# Patient Record
Sex: Female | Born: 1960 | Race: White | Hispanic: No | Marital: Single | Smoking: Former smoker
Health system: Southern US, Community
[De-identification: ages and names within clinical notes are randomized; demographics above are authoritative.]

## PROBLEM LIST (undated history)

## (undated) DIAGNOSIS — M7500 Adhesive capsulitis of unspecified shoulder: Secondary | ICD-10-CM

## (undated) DIAGNOSIS — K219 Gastro-esophageal reflux disease without esophagitis: Secondary | ICD-10-CM

## (undated) DIAGNOSIS — I10 Essential (primary) hypertension: Secondary | ICD-10-CM

## (undated) DIAGNOSIS — G5600 Carpal tunnel syndrome, unspecified upper limb: Secondary | ICD-10-CM

## (undated) DIAGNOSIS — N200 Calculus of kidney: Secondary | ICD-10-CM

## (undated) DIAGNOSIS — F32A Depression, unspecified: Secondary | ICD-10-CM

## (undated) DIAGNOSIS — F329 Major depressive disorder, single episode, unspecified: Secondary | ICD-10-CM

## (undated) DIAGNOSIS — E119 Type 2 diabetes mellitus without complications: Secondary | ICD-10-CM

## (undated) DIAGNOSIS — F603 Borderline personality disorder: Secondary | ICD-10-CM

## (undated) DIAGNOSIS — T7840XA Allergy, unspecified, initial encounter: Secondary | ICD-10-CM

## (undated) DIAGNOSIS — J449 Chronic obstructive pulmonary disease, unspecified: Secondary | ICD-10-CM

## (undated) HISTORY — PX: TONSILLECTOMY: SUR1361

## (undated) HISTORY — DX: Carpal tunnel syndrome, unspecified upper limb: G56.00

## (undated) HISTORY — PX: CHOLECYSTECTOMY: SHX55

## (undated) HISTORY — DX: Gastro-esophageal reflux disease without esophagitis: K21.9

## (undated) HISTORY — DX: Depression, unspecified: F32.A

## (undated) HISTORY — DX: Borderline personality disorder: F60.3

## (undated) HISTORY — DX: Calculus of kidney: N20.0

## (undated) HISTORY — DX: Major depressive disorder, single episode, unspecified: F32.9

## (undated) HISTORY — DX: Chronic obstructive pulmonary disease, unspecified: J44.9

## (undated) HISTORY — DX: Adhesive capsulitis of unspecified shoulder: M75.00

## (undated) HISTORY — DX: Allergy, unspecified, initial encounter: T78.40XA

## (undated) HISTORY — DX: Essential (primary) hypertension: I10

## (undated) HISTORY — DX: Type 2 diabetes mellitus without complications: E11.9

---

## 2002-03-07 ENCOUNTER — Encounter (INDEPENDENT_AMBULATORY_CARE_PROVIDER_SITE_OTHER): Payer: Self-pay | Admitting: *Deleted

## 2003-02-05 ENCOUNTER — Encounter: Admission: RE | Admit: 2003-02-05 | Discharge: 2003-02-05 | Payer: Self-pay | Admitting: Family Medicine

## 2003-04-16 ENCOUNTER — Encounter: Admission: RE | Admit: 2003-04-16 | Discharge: 2003-04-16 | Payer: Self-pay | Admitting: Family Medicine

## 2003-05-14 ENCOUNTER — Encounter: Admission: RE | Admit: 2003-05-14 | Discharge: 2003-05-14 | Payer: Self-pay | Admitting: Family Medicine

## 2003-05-17 ENCOUNTER — Encounter: Admission: RE | Admit: 2003-05-17 | Discharge: 2003-05-17 | Payer: Self-pay | Admitting: Family Medicine

## 2003-05-28 ENCOUNTER — Emergency Department (HOSPITAL_COMMUNITY): Admission: EM | Admit: 2003-05-28 | Discharge: 2003-05-28 | Payer: Self-pay | Admitting: Emergency Medicine

## 2003-06-11 ENCOUNTER — Encounter: Admission: RE | Admit: 2003-06-11 | Discharge: 2003-06-11 | Payer: Self-pay | Admitting: Family Medicine

## 2003-08-20 ENCOUNTER — Encounter: Admission: RE | Admit: 2003-08-20 | Discharge: 2003-08-20 | Payer: Self-pay | Admitting: Family Medicine

## 2003-08-28 ENCOUNTER — Encounter: Admission: RE | Admit: 2003-08-28 | Discharge: 2003-08-28 | Payer: Self-pay | Admitting: Family Medicine

## 2003-09-10 ENCOUNTER — Encounter: Admission: RE | Admit: 2003-09-10 | Discharge: 2003-09-10 | Payer: Self-pay | Admitting: Family Medicine

## 2003-09-24 ENCOUNTER — Encounter: Admission: RE | Admit: 2003-09-24 | Discharge: 2003-09-24 | Payer: Self-pay | Admitting: Family Medicine

## 2003-10-08 ENCOUNTER — Encounter: Admission: RE | Admit: 2003-10-08 | Discharge: 2003-10-08 | Payer: Self-pay | Admitting: Family Medicine

## 2003-10-18 ENCOUNTER — Encounter: Admission: RE | Admit: 2003-10-18 | Discharge: 2003-10-18 | Payer: Self-pay | Admitting: Family Medicine

## 2003-11-05 ENCOUNTER — Encounter: Admission: RE | Admit: 2003-11-05 | Discharge: 2003-11-05 | Payer: Self-pay | Admitting: Family Medicine

## 2003-12-03 ENCOUNTER — Encounter: Admission: RE | Admit: 2003-12-03 | Discharge: 2003-12-03 | Payer: Self-pay | Admitting: Family Medicine

## 2004-01-14 ENCOUNTER — Encounter: Admission: RE | Admit: 2004-01-14 | Discharge: 2004-01-14 | Payer: Self-pay | Admitting: Family Medicine

## 2004-04-07 ENCOUNTER — Ambulatory Visit: Payer: Self-pay | Admitting: Family Medicine

## 2004-05-27 ENCOUNTER — Ambulatory Visit: Payer: Self-pay | Admitting: Sports Medicine

## 2004-06-03 ENCOUNTER — Ambulatory Visit: Payer: Self-pay | Admitting: Sports Medicine

## 2004-06-30 ENCOUNTER — Ambulatory Visit: Payer: Self-pay | Admitting: Family Medicine

## 2004-09-22 ENCOUNTER — Ambulatory Visit: Payer: Self-pay | Admitting: Family Medicine

## 2004-10-20 ENCOUNTER — Ambulatory Visit: Payer: Self-pay | Admitting: Family Medicine

## 2004-11-03 ENCOUNTER — Ambulatory Visit: Payer: Self-pay | Admitting: Family Medicine

## 2004-11-04 ENCOUNTER — Encounter: Admission: RE | Admit: 2004-11-04 | Discharge: 2004-11-04 | Payer: Self-pay | Admitting: Sports Medicine

## 2005-03-02 ENCOUNTER — Ambulatory Visit: Payer: Self-pay | Admitting: Sports Medicine

## 2005-03-23 ENCOUNTER — Ambulatory Visit: Payer: Self-pay | Admitting: Family Medicine

## 2005-04-13 ENCOUNTER — Ambulatory Visit: Payer: Self-pay | Admitting: Family Medicine

## 2005-07-05 ENCOUNTER — Ambulatory Visit: Payer: Self-pay | Admitting: Family Medicine

## 2005-07-09 ENCOUNTER — Ambulatory Visit: Payer: Self-pay | Admitting: Family Medicine

## 2005-07-27 ENCOUNTER — Ambulatory Visit: Payer: Self-pay | Admitting: Sports Medicine

## 2005-08-10 ENCOUNTER — Ambulatory Visit: Payer: Self-pay | Admitting: Family Medicine

## 2005-08-10 ENCOUNTER — Encounter: Payer: Self-pay | Admitting: Family Medicine

## 2005-08-27 ENCOUNTER — Ambulatory Visit: Payer: Self-pay | Admitting: Family Medicine

## 2005-08-31 ENCOUNTER — Ambulatory Visit: Payer: Self-pay | Admitting: Family Medicine

## 2005-09-14 ENCOUNTER — Ambulatory Visit: Payer: Self-pay | Admitting: Sports Medicine

## 2005-10-06 ENCOUNTER — Ambulatory Visit: Payer: Self-pay | Admitting: Family Medicine

## 2006-01-18 ENCOUNTER — Ambulatory Visit: Payer: Self-pay | Admitting: Family Medicine

## 2006-02-28 ENCOUNTER — Ambulatory Visit: Payer: Self-pay | Admitting: Family Medicine

## 2006-03-22 ENCOUNTER — Ambulatory Visit: Payer: Self-pay | Admitting: Family Medicine

## 2006-03-28 ENCOUNTER — Ambulatory Visit: Payer: Self-pay | Admitting: Family Medicine

## 2006-03-30 ENCOUNTER — Ambulatory Visit: Payer: Self-pay | Admitting: Sports Medicine

## 2006-04-12 ENCOUNTER — Ambulatory Visit: Payer: Self-pay | Admitting: Family Medicine

## 2006-08-04 DIAGNOSIS — K589 Irritable bowel syndrome without diarrhea: Secondary | ICD-10-CM

## 2006-08-04 DIAGNOSIS — F339 Major depressive disorder, recurrent, unspecified: Secondary | ICD-10-CM

## 2006-08-04 DIAGNOSIS — F603 Borderline personality disorder: Secondary | ICD-10-CM

## 2006-08-04 DIAGNOSIS — I10 Essential (primary) hypertension: Secondary | ICD-10-CM

## 2006-08-05 ENCOUNTER — Encounter (INDEPENDENT_AMBULATORY_CARE_PROVIDER_SITE_OTHER): Payer: Self-pay | Admitting: *Deleted

## 2006-09-20 ENCOUNTER — Ambulatory Visit: Payer: Self-pay | Admitting: Family Medicine

## 2006-09-20 ENCOUNTER — Encounter: Payer: Self-pay | Admitting: Family Medicine

## 2006-09-20 DIAGNOSIS — K219 Gastro-esophageal reflux disease without esophagitis: Secondary | ICD-10-CM

## 2006-09-29 ENCOUNTER — Encounter: Payer: Self-pay | Admitting: Family Medicine

## 2006-10-05 ENCOUNTER — Encounter: Payer: Self-pay | Admitting: Family Medicine

## 2006-10-12 ENCOUNTER — Telehealth (INDEPENDENT_AMBULATORY_CARE_PROVIDER_SITE_OTHER): Payer: Self-pay | Admitting: *Deleted

## 2006-11-02 ENCOUNTER — Telehealth: Payer: Self-pay | Admitting: *Deleted

## 2006-11-28 ENCOUNTER — Telehealth: Payer: Self-pay | Admitting: *Deleted

## 2006-12-06 ENCOUNTER — Ambulatory Visit: Payer: Self-pay | Admitting: Family Medicine

## 2006-12-12 ENCOUNTER — Telehealth: Payer: Self-pay | Admitting: *Deleted

## 2006-12-13 ENCOUNTER — Ambulatory Visit: Payer: Self-pay | Admitting: Family Medicine

## 2006-12-13 ENCOUNTER — Encounter: Payer: Self-pay | Admitting: *Deleted

## 2006-12-13 LAB — CONVERTED CEMR LAB
Blood in Urine, dipstick: NEGATIVE
Glucose, Urine, Semiquant: NEGATIVE
Ketones, urine, test strip: NEGATIVE
Nitrite: NEGATIVE
Protein, U semiquant: NEGATIVE
pH: 6

## 2006-12-15 LAB — CONVERTED CEMR LAB
Chlamydia, DNA Probe: NEGATIVE
GC Probe Amp, Genital: NEGATIVE

## 2006-12-20 ENCOUNTER — Telehealth: Payer: Self-pay | Admitting: *Deleted

## 2006-12-21 ENCOUNTER — Ambulatory Visit: Payer: Self-pay | Admitting: Family Medicine

## 2006-12-21 LAB — CONVERTED CEMR LAB
Blood in Urine, dipstick: NEGATIVE
Specific Gravity, Urine: 1.015
Urobilinogen, UA: 1
Whiff Test: NEGATIVE

## 2006-12-23 ENCOUNTER — Telehealth: Payer: Self-pay | Admitting: *Deleted

## 2006-12-26 ENCOUNTER — Telehealth: Payer: Self-pay | Admitting: *Deleted

## 2006-12-28 ENCOUNTER — Ambulatory Visit: Payer: Self-pay | Admitting: Family Medicine

## 2006-12-28 ENCOUNTER — Encounter (INDEPENDENT_AMBULATORY_CARE_PROVIDER_SITE_OTHER): Payer: Self-pay | Admitting: Family Medicine

## 2006-12-28 LAB — CONVERTED CEMR LAB
Ketones, urine, test strip: NEGATIVE
Nitrite: NEGATIVE
Specific Gravity, Urine: 1.025

## 2007-01-20 ENCOUNTER — Encounter (INDEPENDENT_AMBULATORY_CARE_PROVIDER_SITE_OTHER): Payer: Self-pay | Admitting: Family Medicine

## 2007-01-20 ENCOUNTER — Telehealth (INDEPENDENT_AMBULATORY_CARE_PROVIDER_SITE_OTHER): Payer: Self-pay | Admitting: *Deleted

## 2007-01-20 ENCOUNTER — Ambulatory Visit: Payer: Self-pay | Admitting: Family Medicine

## 2007-01-20 LAB — CONVERTED CEMR LAB
Bilirubin Urine: NEGATIVE
Ketones, urine, test strip: NEGATIVE
Specific Gravity, Urine: >=1.03
Whiff Test: NEGATIVE
pH: 6

## 2007-01-21 ENCOUNTER — Encounter (INDEPENDENT_AMBULATORY_CARE_PROVIDER_SITE_OTHER): Payer: Self-pay | Admitting: Family Medicine

## 2007-01-30 ENCOUNTER — Ambulatory Visit: Payer: Self-pay | Admitting: Family Medicine

## 2007-01-30 ENCOUNTER — Telehealth (INDEPENDENT_AMBULATORY_CARE_PROVIDER_SITE_OTHER): Payer: Self-pay | Admitting: *Deleted

## 2007-02-21 ENCOUNTER — Ambulatory Visit: Payer: Self-pay | Admitting: Family Medicine

## 2007-02-21 ENCOUNTER — Encounter: Admission: RE | Admit: 2007-02-21 | Discharge: 2007-02-21 | Payer: Self-pay | Admitting: Family Medicine

## 2007-02-24 ENCOUNTER — Encounter: Payer: Self-pay | Admitting: Family Medicine

## 2007-02-27 ENCOUNTER — Telehealth: Payer: Self-pay | Admitting: Family Medicine

## 2007-03-27 ENCOUNTER — Ambulatory Visit: Payer: Self-pay | Admitting: Sports Medicine

## 2007-03-27 ENCOUNTER — Telehealth: Payer: Self-pay | Admitting: *Deleted

## 2007-04-07 ENCOUNTER — Telehealth: Payer: Self-pay | Admitting: Family Medicine

## 2007-06-09 ENCOUNTER — Encounter (INDEPENDENT_AMBULATORY_CARE_PROVIDER_SITE_OTHER): Payer: Self-pay | Admitting: Family Medicine

## 2007-06-09 ENCOUNTER — Ambulatory Visit: Payer: Self-pay | Admitting: Family Medicine

## 2007-06-09 DIAGNOSIS — J4489 Other specified chronic obstructive pulmonary disease: Secondary | ICD-10-CM | POA: Insufficient documentation

## 2007-06-09 DIAGNOSIS — J449 Chronic obstructive pulmonary disease, unspecified: Secondary | ICD-10-CM

## 2007-06-09 LAB — CONVERTED CEMR LAB: Whiff Test: NEGATIVE

## 2007-06-13 ENCOUNTER — Telehealth: Payer: Self-pay | Admitting: *Deleted

## 2007-06-14 ENCOUNTER — Ambulatory Visit: Payer: Self-pay | Admitting: Family Medicine

## 2007-06-14 ENCOUNTER — Encounter (INDEPENDENT_AMBULATORY_CARE_PROVIDER_SITE_OTHER): Payer: Self-pay | Admitting: Family Medicine

## 2007-06-14 LAB — CONVERTED CEMR LAB
ALT: 20 units/L (ref 0–35)
BUN: 17 mg/dL (ref 6–23)
CO2: 24 meq/L (ref 19–32)
Creatinine, Ser: 0.65 mg/dL (ref 0.40–1.20)
Glucose, Urine, Semiquant: NEGATIVE
HCT: 45.6 % (ref 36.0–46.0)
MCV: 91 fL (ref 78.0–100.0)
Nitrite: NEGATIVE
Platelets: 282 10*3/uL (ref 150–400)
Total Bilirubin: 0.5 mg/dL (ref 0.3–1.2)
Urobilinogen, UA: 0.2
WBC: 8.6 10*3/uL (ref 4.0–10.5)

## 2007-06-21 ENCOUNTER — Telehealth: Payer: Self-pay | Admitting: *Deleted

## 2007-06-22 ENCOUNTER — Ambulatory Visit: Payer: Self-pay | Admitting: Family Medicine

## 2007-07-13 ENCOUNTER — Telehealth: Payer: Self-pay | Admitting: *Deleted

## 2007-07-14 ENCOUNTER — Ambulatory Visit: Payer: Self-pay | Admitting: Family Medicine

## 2007-07-25 ENCOUNTER — Encounter: Payer: Self-pay | Admitting: Family Medicine

## 2007-07-25 ENCOUNTER — Ambulatory Visit: Payer: Self-pay | Admitting: Family Medicine

## 2007-07-25 DIAGNOSIS — E669 Obesity, unspecified: Secondary | ICD-10-CM

## 2007-08-15 ENCOUNTER — Encounter (INDEPENDENT_AMBULATORY_CARE_PROVIDER_SITE_OTHER): Payer: Self-pay | Admitting: Family Medicine

## 2007-08-15 ENCOUNTER — Ambulatory Visit: Payer: Self-pay | Admitting: Family Medicine

## 2007-08-22 ENCOUNTER — Ambulatory Visit: Payer: Self-pay | Admitting: Family Medicine

## 2007-08-29 ENCOUNTER — Ambulatory Visit: Payer: Self-pay | Admitting: Family Medicine

## 2007-08-29 LAB — CONVERTED CEMR LAB: Whiff Test: NEGATIVE

## 2007-09-19 ENCOUNTER — Ambulatory Visit: Payer: Self-pay | Admitting: Family Medicine

## 2007-10-11 ENCOUNTER — Ambulatory Visit: Payer: Self-pay | Admitting: Family Medicine

## 2007-10-17 ENCOUNTER — Encounter: Payer: Self-pay | Admitting: Family Medicine

## 2007-10-17 ENCOUNTER — Ambulatory Visit: Payer: Self-pay | Admitting: Family Medicine

## 2007-10-17 ENCOUNTER — Encounter: Payer: Self-pay | Admitting: *Deleted

## 2007-10-17 LAB — CONVERTED CEMR LAB: Pap Smear: NORMAL

## 2007-10-24 ENCOUNTER — Encounter: Payer: Self-pay | Admitting: Family Medicine

## 2007-10-31 ENCOUNTER — Ambulatory Visit: Payer: Self-pay | Admitting: Family Medicine

## 2007-11-02 ENCOUNTER — Telehealth: Payer: Self-pay | Admitting: *Deleted

## 2007-11-16 ENCOUNTER — Encounter: Admission: RE | Admit: 2007-11-16 | Discharge: 2007-12-28 | Payer: Self-pay | Admitting: Family Medicine

## 2007-11-27 ENCOUNTER — Ambulatory Visit: Payer: Self-pay | Admitting: Sports Medicine

## 2007-11-28 ENCOUNTER — Telehealth (INDEPENDENT_AMBULATORY_CARE_PROVIDER_SITE_OTHER): Payer: Self-pay | Admitting: *Deleted

## 2007-11-29 ENCOUNTER — Emergency Department (HOSPITAL_COMMUNITY): Admission: EM | Admit: 2007-11-29 | Discharge: 2007-11-29 | Payer: Self-pay | Admitting: Family Medicine

## 2007-11-30 ENCOUNTER — Telehealth: Payer: Self-pay | Admitting: *Deleted

## 2007-12-05 ENCOUNTER — Telehealth: Payer: Self-pay | Admitting: *Deleted

## 2007-12-05 ENCOUNTER — Ambulatory Visit: Payer: Self-pay | Admitting: Family Medicine

## 2007-12-26 ENCOUNTER — Ambulatory Visit: Payer: Self-pay | Admitting: Family Medicine

## 2008-01-09 ENCOUNTER — Encounter: Payer: Self-pay | Admitting: Family Medicine

## 2008-01-12 ENCOUNTER — Ambulatory Visit: Payer: Self-pay | Admitting: Family Medicine

## 2008-01-15 ENCOUNTER — Ambulatory Visit: Payer: Self-pay | Admitting: Sports Medicine

## 2008-01-15 DIAGNOSIS — M7500 Adhesive capsulitis of unspecified shoulder: Secondary | ICD-10-CM | POA: Insufficient documentation

## 2008-02-09 ENCOUNTER — Ambulatory Visit: Payer: Self-pay | Admitting: Family Medicine

## 2008-02-09 LAB — CONVERTED CEMR LAB: Beta hcg, urine, semiquantitative: NEGATIVE

## 2008-03-12 ENCOUNTER — Telehealth (INDEPENDENT_AMBULATORY_CARE_PROVIDER_SITE_OTHER): Payer: Self-pay | Admitting: *Deleted

## 2008-03-15 ENCOUNTER — Ambulatory Visit: Payer: Self-pay | Admitting: Family Medicine

## 2008-03-22 ENCOUNTER — Ambulatory Visit: Payer: Self-pay | Admitting: Family Medicine

## 2008-03-29 ENCOUNTER — Telehealth: Payer: Self-pay | Admitting: *Deleted

## 2008-05-01 ENCOUNTER — Ambulatory Visit: Payer: Self-pay | Admitting: Family Medicine

## 2008-05-01 LAB — CONVERTED CEMR LAB
ALT: 12 units/L (ref 0–35)
AST: 12 units/L (ref 0–37)
Albumin: 3.8 g/dL (ref 3.5–5.2)
Alkaline Phosphatase: 65 units/L (ref 39–117)
Calcium: 8.8 mg/dL (ref 8.4–10.5)
Chloride: 102 meq/L (ref 96–112)
Hemoglobin: 13.5 g/dL (ref 12.0–15.0)
LDL Cholesterol: 90 mg/dL (ref 0–99)
Platelets: 274 10*3/uL (ref 150–400)
Potassium: 4 meq/L (ref 3.5–5.3)
RDW: 14.4 % (ref 11.5–15.5)
Sodium: 140 meq/L (ref 135–145)
TSH: 3.37 microintl units/mL (ref 0.350–4.50)
Total Protein: 6.9 g/dL (ref 6.0–8.3)

## 2008-06-17 ENCOUNTER — Telehealth: Payer: Self-pay | Admitting: *Deleted

## 2008-06-19 ENCOUNTER — Ambulatory Visit: Payer: Self-pay | Admitting: Family Medicine

## 2008-07-10 ENCOUNTER — Telehealth: Payer: Self-pay | Admitting: Family Medicine

## 2008-07-30 ENCOUNTER — Ambulatory Visit: Payer: Self-pay | Admitting: Family Medicine

## 2008-07-30 DIAGNOSIS — M159 Polyosteoarthritis, unspecified: Secondary | ICD-10-CM | POA: Insufficient documentation

## 2008-10-01 ENCOUNTER — Encounter: Payer: Self-pay | Admitting: Family Medicine

## 2008-10-01 ENCOUNTER — Ambulatory Visit: Payer: Self-pay | Admitting: Family Medicine

## 2008-10-01 LAB — CONVERTED CEMR LAB: Beta hcg, urine, semiquantitative: NEGATIVE

## 2008-10-08 ENCOUNTER — Encounter: Payer: Self-pay | Admitting: Family Medicine

## 2008-10-28 ENCOUNTER — Ambulatory Visit: Payer: Self-pay | Admitting: Family Medicine

## 2008-11-05 ENCOUNTER — Ambulatory Visit: Payer: Self-pay | Admitting: Family Medicine

## 2008-11-13 ENCOUNTER — Telehealth: Payer: Self-pay | Admitting: *Deleted

## 2008-11-15 ENCOUNTER — Ambulatory Visit: Payer: Self-pay | Admitting: Family Medicine

## 2008-11-18 ENCOUNTER — Ambulatory Visit: Payer: Self-pay | Admitting: Family Medicine

## 2008-11-18 DIAGNOSIS — J309 Allergic rhinitis, unspecified: Secondary | ICD-10-CM

## 2008-11-22 ENCOUNTER — Ambulatory Visit: Payer: Self-pay | Admitting: Family Medicine

## 2008-12-12 ENCOUNTER — Ambulatory Visit: Payer: Self-pay | Admitting: Family Medicine

## 2008-12-12 ENCOUNTER — Telehealth: Payer: Self-pay | Admitting: *Deleted

## 2009-01-21 ENCOUNTER — Encounter: Admission: RE | Admit: 2009-01-21 | Discharge: 2009-01-21 | Payer: Self-pay | Admitting: Family Medicine

## 2009-01-21 ENCOUNTER — Ambulatory Visit: Payer: Self-pay | Admitting: Family Medicine

## 2009-05-13 ENCOUNTER — Ambulatory Visit: Payer: Self-pay | Admitting: Family Medicine

## 2009-06-09 ENCOUNTER — Ambulatory Visit: Payer: Self-pay | Admitting: Family Medicine

## 2009-07-31 ENCOUNTER — Ambulatory Visit: Payer: Self-pay | Admitting: Family Medicine

## 2009-08-05 ENCOUNTER — Telehealth: Payer: Self-pay | Admitting: Family Medicine

## 2009-08-08 ENCOUNTER — Ambulatory Visit: Payer: Self-pay | Admitting: Family Medicine

## 2009-08-18 ENCOUNTER — Telehealth: Payer: Self-pay | Admitting: Family Medicine

## 2009-08-18 ENCOUNTER — Ambulatory Visit: Payer: Self-pay | Admitting: Family Medicine

## 2009-08-29 ENCOUNTER — Ambulatory Visit: Payer: Self-pay | Admitting: Family Medicine

## 2009-09-24 ENCOUNTER — Ambulatory Visit: Payer: Self-pay | Admitting: Family Medicine

## 2009-10-06 ENCOUNTER — Ambulatory Visit: Payer: Self-pay | Admitting: Family Medicine

## 2009-10-06 LAB — CONVERTED CEMR LAB
ALT: 22 units/L (ref 0–35)
Albumin: 4 g/dL (ref 3.5–5.2)
Alkaline Phosphatase: 57 units/L (ref 39–117)
Basophils Absolute: 0.1 10*3/uL (ref 0.0–0.1)
Basophils Relative: 1 % (ref 0–1)
CO2: 24 meq/L (ref 19–32)
FSH: 25.4 milliintl units/mL
Glucose, Bld: 108 mg/dL — ABNORMAL HIGH (ref 70–99)
Hemoglobin: 13.5 g/dL (ref 12.0–15.0)
LDL Cholesterol: 96 mg/dL (ref 0–99)
Lymphocytes Relative: 32 % (ref 12–46)
Monocytes Absolute: 0.5 10*3/uL (ref 0.1–1.0)
Monocytes Relative: 7 % (ref 3–12)
Neutro Abs: 4.5 10*3/uL (ref 1.7–7.7)
Neutrophils Relative %: 59 % (ref 43–77)
Pap Smear: NEGATIVE
Potassium: 3.8 meq/L (ref 3.5–5.3)
RBC: 4.4 M/uL (ref 3.87–5.11)
Sodium: 140 meq/L (ref 135–145)
TSH: 3.878 microintl units/mL (ref 0.350–4.500)
Total Bilirubin: 0.6 mg/dL (ref 0.3–1.2)
Total Protein: 6.7 g/dL (ref 6.0–8.3)
Triglycerides: 167 mg/dL — ABNORMAL HIGH (ref <150)
VLDL: 33 mg/dL (ref 0–40)

## 2009-10-07 ENCOUNTER — Encounter: Payer: Self-pay | Admitting: Family Medicine

## 2009-10-16 ENCOUNTER — Ambulatory Visit: Payer: Self-pay | Admitting: Family Medicine

## 2009-10-16 LAB — CONVERTED CEMR LAB: Beta hcg, urine, semiquantitative: NEGATIVE

## 2009-11-26 ENCOUNTER — Telehealth: Payer: Self-pay | Admitting: Family Medicine

## 2010-01-29 ENCOUNTER — Ambulatory Visit: Payer: Self-pay | Admitting: Family Medicine

## 2010-03-04 ENCOUNTER — Ambulatory Visit: Payer: Self-pay | Admitting: Family Medicine

## 2010-04-20 ENCOUNTER — Encounter: Payer: Self-pay | Admitting: Family Medicine

## 2010-05-25 ENCOUNTER — Ambulatory Visit: Payer: Self-pay

## 2010-06-10 ENCOUNTER — Ambulatory Visit
Admission: RE | Admit: 2010-06-10 | Discharge: 2010-06-10 | Payer: Self-pay | Source: Home / Self Care | Attending: Family Medicine | Admitting: Family Medicine

## 2010-06-10 DIAGNOSIS — G5600 Carpal tunnel syndrome, unspecified upper limb: Secondary | ICD-10-CM | POA: Insufficient documentation

## 2010-06-18 ENCOUNTER — Ambulatory Visit
Admission: RE | Admit: 2010-06-18 | Discharge: 2010-06-18 | Payer: Self-pay | Source: Home / Self Care | Attending: Family Medicine | Admitting: Family Medicine

## 2010-06-18 DIAGNOSIS — K625 Hemorrhage of anus and rectum: Secondary | ICD-10-CM | POA: Insufficient documentation

## 2010-07-07 NOTE — Assessment & Plan Note (Signed)
Summary: fingernail infected.   Vital Signs:  Patient profile:   50 year old female Height:      62 inches Weight:      234 pounds BMI:     42.95 Temp:     98.3 degrees F oral Pulse rate:   52 / minute BP sitting:   144 / 85  (right arm) Cuff size:   regular  Vitals Entered By: Tessie Fass CMA (June 09, 2009 11:02 AM) CC: infected finger Is Patient Diabetic? No Pain Assessment Patient in pain? no        Primary Care Provider:  Tinnie Gens MD  CC:  infected finger.  History of Present Illness: Ms. Mangal comes in today for an infected fingernail on her right hand.  It is her right ring finger.  1 week ago noticed redness and swelling along lateral nail bed.  Saw some pus in it so stuck it with a straight pin and drained the "pus and blood".  Was not particular tender then.  Is not at all tender now.  No  further pus drainage for several days.  Putting peroxide on it several times a day.  Still red but not swollen.  Has peeling skin over the area now.  No fevers or other complaints.   Habits & Providers  Alcohol-Tobacco-Diet     Tobacco Status: quit  Allergies: No Known Drug Allergies  Physical Exam  General:  overweight, alert, NAD, vitals reviewed.  Skin:  right 4th finger with erythema but no edema along tip, down lateral nail bed edge, and under bottom of nail.  Nontender.  No fluctuance or induration.  No streaking.  No swelling of finger or hand.  Neurovascularly intact.  Skin is dry and peeling   Impression & Recommendations:  Problem # 1:  PARONYCHIA, FINGER (ICD-681.02) Assessment New  Sounds like she had acute paronychia but drained it on her own at home.  Does not appear infected now.  Appears to be healing.  Feel the frequent peroxide may be drying and irritating the area.  REcommended keep area dry and apply vaseline to area.  Otherwise monitor.  If worsens or collects pus again will return. Precepted with Dr. Swaziland.   Orders: [facility]- Est Level  3  (16109)  Complete Medication List: 1)  Flexeril 10 Mg Tabs (Cyclobenzaprine hcl) .Marland Kitchen.. 1 by mouth three times a day prn 2)  Naprosyn 500 Mg Tabs (Naproxen) .Marland Kitchen.. 1 by mouth two times a day w/ food 3)  Atenolol 50 Mg Tabs (Atenolol) .Marland Kitchen.. 1 by mouth two times a day 4)  Ibu 800 Mg Tabs (Ibuprofen) .... Take one tablet every 8 hours for pain. do not take with naprosyn. 5)  Hydrochlorothiazide 25 Mg Tabs (Hydrochlorothiazide) .... Take one (1) by mouth daily 6)  Tylenol With Codeine #3 300-30 Mg Tabs (Acetaminophen-codeine) .... One- two tabs q 6 hours as needed for pain and cough suppression 7)  Zyrtec Allergy 10 Mg Tabs (Cetirizine hcl) .... One by mouth daily

## 2010-07-07 NOTE — Assessment & Plan Note (Signed)
Summary: cpe/pap,tcb   Vital Signs:  Patient profile:   50 year old female Height:      62.75 inches Weight:      228.6 pounds BMI:     40.97 Temp:     98.0 degrees F BP sitting:   136 / 70  (left arm)  Vitals Entered By: Starleen Blue RN (Oct 06, 2009 11:04 AM) CC: cpp Is Patient Diabetic? No Pain Assessment Patient in pain? no        Primary Care Provider:  Tinnie Gens MD  CC:  cpp.  History of Present Illness: Here for CPE.  Has had a lot of issues with allergies, bronchitis and sinusitis this spring.  She has a h/o being on abx 3 times and signs's persist.  She is unable to afford inhaled steroid and is not consistently taking allegra secondary to cost.  She was seen in ED and rx'd again for bronchitis.  She reports continued issue with fluid in ears and drainage in chest.  She reports some mild chest discomfort which lasts for an unknown amount of time.  Also, having painc attacks and facial numbness.  Denies arm pain, diaphoresis, nausea or neck pain.  Has not had cycle in 3 wks.  Seen in ED with negative UPT.  Habits & Providers  Alcohol-Tobacco-Diet     Tobacco Status: quit  Exercise-Depression-Behavior     Does Patient Exercise: no     STD Risk: past     STD Risk Counseling: to avoid increased STD risk     Seat Belt Use: always     Sun Exposure: frequent  Current Problems (verified): 1)  Health Maintenance Exam  (ICD-V70.0) 2)  Oligomenorrhea  (ICD-626.1) 3)  Screening For Malignant Neoplasm of The Cervix  (ICD-V76.2) 4)  Uri  (ICD-465.9) 5)  Edema- Localized  (ICD-782.3) 6)  Allergic Rhinitis  (ICD-477.9) 7)  Otitis Externa, Acute, Left  (ICD-380.12) 8)  Arthritis  (ICD-716.90) 9)  Broken Tooth, Infected  (ICD-873.73) 10)  Urinary Frequency  (ICD-788.41) 11)  Adhesive Capsulitis of Shoulder  (ICD-726.0) 12)  Shoulder Pain, Right  (ICD-719.41) 13)  External Hemorrhoids  (ICD-455.3) 14)  Obesity  (ICD-278.00) 15)  Dyspareunia  (ICD-625.0) 16)  Dry Eye  Syndrome  (ICD-375.15) 17)  COPD  (ICD-496) 18)  Tobacco Abuse-quit  (ICD-305.1) 19)  Absence of Menstruation  (ICD-626.0) 20)  Candidiasis, Vulva/vagina  (ICD-112.1) 21)  Sexual Activity, High Risk  (ICD-V69.2) 22)  Screening For Malignant Neoplasm, Skin  (ICD-V76.43) 23)  Gynecological Examination, Routine  (ICD-V72.31) 24)  Gerd  (ICD-530.81) 25)  Irritable Bowel Syndrome  (ICD-564.1) 26)  Hypertension, Benign Systemic  (ICD-401.1) 27)  Depression, Major, Recurrent  (ICD-296.30) 28)  Borderline Personality  (ICD-301.83)  Current Medications (verified): 1)  Atenolol 50 Mg  Tabs (Atenolol) .Marland Kitchen.. 1 By Mouth Two Times A Day 2)  Ibuprofen 200 Mg Tabs (Ibuprofen) .... Take Up To Ever 4 Hours As Needed 3)  Hydrochlorothiazide 25 Mg Tabs (Hydrochlorothiazide) .... Take One (1) By Mouth Daily 4)  Benzonatate 100 Mg Caps (Benzonatate) .... Take Up To 3 Times A Day As Needed For Cough. 5)  Ventolin Hfa 108 (90 Base) Mcg/act Aers (Albuterol Sulfate) .... 2 Puffs Qid As Needed 6)  Cortisporin-Tc 3.08-07-08-0.5 Mg/ml Susp (Neomycin-Colist-Hc-Thonzonium) .... 3-4 Gtt Affected Ear Three Times A Day  Allergies (verified): No Known Drug Allergies  Past History:  Past Medical History: Last updated: 08/15/2007 Did not tolerate prev diuretic or beta-blocker use, Heel Spurs    Past  Surgical History: Last updated: 08/15/2007 Cholecystectomy - 06/07/1997, Tonsillectomy - 06/07/1977    Family History: Last updated: 03/22/2008 Diabetes 1st degree, hypertension   no FH of Rheumatoid  arthritis that she is aware of  Social History: Last updated: 08/15/2007 Patient single.  Mult sexual partners recently. Currently unemployed and looking for work    Risk Factors: Exercise: no (10/06/2009)  Risk Factors: Smoking Status: quit (10/06/2009) Packs/Day: <1/4 (08/29/2007)  Social History: STD Risk:  past Seat Belt Use:  always Sun Exposure-Excessive:  frequent Does Patient Exercise:  no  Review of  Systems       The patient complains of decreased hearing, chest pain, prolonged cough, abdominal pain, and severe indigestion/heartburn.  The patient denies anorexia, weight loss, weight gain, dyspnea on exertion, peripheral edema, headaches, suspicious skin lesions, depression, enlarged lymph nodes, and breast masses.    Physical Exam  General:  alert, well-nourished, and overweight-appearing.   Head:  normocephalic and atraumatic.   Ears:  R ear normal and L ear normal.   Mouth:  good dentition.   Neck:  supple.   Breasts:  No mass, nodules, thickening, tenderness, bulging, retraction, inflamation, nipple discharge or skin changes noted.   Lungs:  normal respiratory effort, no accessory muscle use, and normal breath sounds.   Heart:  normal rate, regular rhythm, and no murmur.   Abdomen:  soft, non-tender, no distention, no masses, and no guarding.   Genitalia:  Normal introitus for age, no external lesions, no vaginal discharge, mucosa pink and moist, no vaginal or cervical lesions, no vaginal atrophy, no friaility or hemorrhage, normal uterus size and position, no adnexal masses or tenderness Msk:  decreased ROM bilateral shoulders.  no redness over joints and decreased ROM.   Pulses:  R radial normal, R dorsalis pedis normal, L radial normal, and L dorsalis pedis normal.   Extremities:  No clubbing, cyanosis, edema.   Neurologic:  alert & oriented X3.      Impression & Recommendations:  Problem # 1:  HEALTH MAINTENANCE EXAM (ICD-V70.0)  Orders: CBC w/Diff-[facility] (16109) [facility] - Est  40-64 yrs (60454)  Problem # 2:  OLIGOMENORRHEA (ICD-626.1)  Orders: [facility] ([id number]) [facility] ([id number])  Problem # 3:  HYPERTENSION, BENIGN SYSTEMIC (ICD-401.1)  Her updated medication list for this problem includes:    Atenolol 50 Mg Tabs (Atenolol) .Marland Kitchen... 1 by mouth two times a day    Hydrochlorothiazide 25 Mg Tabs (Hydrochlorothiazide) .Marland Kitchen... Take one (1) by mouth daily  Orders: Comp  Met-[facility] [phone]) Lipid-[facility] ([id number])  Problem # 4:  ALLERGIC RHINITIS (ICD-477.9) Continue allegra, add cortisporin otic  Complete Medication List: 1)  Atenolol 50 Mg Tabs (Atenolol) .Marland Kitchen.. 1 by mouth two times a day 2)  Ibuprofen 200 Mg Tabs (Ibuprofen) .... Take up to ever 4 hours as needed 3)  Hydrochlorothiazide 25 Mg Tabs (Hydrochlorothiazide) .... Take one (1) by mouth daily 4)  Benzonatate 100 Mg Caps (Benzonatate) .... Take up to 3 times a day as needed for cough. 5)  Ventolin Hfa 108 (90 Base) Mcg/act Aers (Albuterol sulfate) .... 2 puffs qid as needed 6)  Cortisporin-tc 3.08-07-08-0.5 Mg/ml Susp (Neomycin-colist-hc-thonzonium) .... 3-4 gtt affected ear three times a day  Other Orders: Pap Smear-[facility] ([id number])  Patient Instructions: 1)  Please schedule a follow-up appointment in 3 months .  Prescriptions: CORTISPORIN-TC 3.08-07-08-0.5 MG/ML SUSP (NEOMYCIN-COLIST-HC-THONZONIUM) 3-4 gtt affected ear three times a day  #1 bottle x 0   Entered and Authorized by:   Tinnie Gens MD   Signed by:  Tinnie Gens MD on 10/06/2009   Method used:   Electronically to        [facility] [phone]* (retail)       [address]       [place], [place]  [address]       Ph: [phone]       Fax: [phone]   RxID:   [id number] ATENOLOL 50 MG  TABS (ATENOLOL) 1 by mouth two times a day  #60 x 5   Entered and Authorized by:   Tinnie Gens MD   Signed by:   Tinnie Gens MD on 10/06/2009   Method used:   Electronically to        [facility] [phone]* (retail)       [address]       [place], [place]  [address]       Ph: [phone]       Fax: [phone]   RxID:   [id number]    Prevention & Chronic Care Immunizations   Influenza vaccine: refused  (03/22/2008)   Influenza vaccine due: 03/22/2009    Tetanus booster: 09/04/2007: Historical   Tetanus booster due: 09/03/2017    Pneumococcal vaccine: Not documented    Pneumococcal vaccine deferral: Not indicated  (10/06/2009)  Other Screening   Pap smear: NEGATIVE FOR INTRAEPITHELIAL LESIONS OR MALIGNANCY.  (10/01/2008)   Pap smear due: 10/07/2010    Mammogram: ASSESSMENT: Negative - BI-RADS 1^MM DIGITAL SCREENING  (01/21/2009)   Mammogram due: 01/21/2010   Smoking status: quit  (10/06/2009)  Lipids   Total Cholesterol: 160  (05/01/2008)   LDL: 90  (05/01/2008)   LDL Direct: Not documented   HDL: 53  (05/01/2008)   Triglycerides: 86  (05/01/2008)  Hypertension   Last Blood Pressure: 136 / 70  (10/06/2009)   Serum creatinine: 0.76  (05/01/2008)   Serum potassium 4.0  (05/01/2008) CMP ordered     Hypertension flowsheet reviewed?: Yes   Progress toward BP goal: Improved  Self-Management Support :    Hypertension self-management support: Not documented

## 2010-07-07 NOTE — Progress Notes (Signed)
Summary: triage- tingling  Phone Note Call from Patient Call back at [phone]   Caller: Patient Summary of Call: has had tingling in face & jittery feeling Initial call taken by: De Nurse,  August 05, 2009 9:05 AM  Follow-up for Phone Call        started yesterday. arms are also tingling. denies chest pain. states this has not happened before. is getting over a URI at this time. was here for that 07/31/09. she is very concerned about the tingling. told her I will speak with md & call her right back Follow-up by: Golden Circle RN,  August 05, 2009 9:07 AM  Additional Follow-up for Phone Call Additional follow up Details #1::        Attempted to call patient and discuss.  LM to call clinic. Additional Follow-up by: Sarah Swaziland MD,  August 05, 2009 9:23 AM    Additional Follow-up for Phone Call Additional follow up Details #2::    told her md wanted to talk with her. to Dr. Swaziland to call now Follow-up by: Golden Circle RN,  August 05, 2009 10:19 AM  Additional Follow-up for Phone Call Additional follow up Details #3:: Details for Additional Follow-up Action Taken: Talked with pt. She is having some tingling throughout her body (arms, legs, back, face and top of head) since yesterday.  Only meds are BP meds and some ibuprofen.  Denies dizziness, chest pain, diaphoresis, rash.  Does endorse some pressure in her neck, no dyspnea.  No worsening with exertion. Pt has appt with Dr. Shawnie Pons Friday.  Reviewed red flags today and will have her follow up with Dr. Shawnie Pons. Pt voices understanding of warning signs and knows when to contact us if she needs to.   Additional Follow-up by: Sarah Swaziland MD,  August 05, 2009 11:40 AM

## 2010-07-07 NOTE — Assessment & Plan Note (Signed)
Summary: ?bronchitis   Vital Signs:  Patient profile:   50 year old female Weight:      230 pounds Temp:     98.3 degrees F oral Pulse rate:   79 / minute Pulse rhythm:   regular BP sitting:   162 / 89  (left arm) Cuff size:   large  Vitals Entered By: Loralee Pacas CMA (Oct 16, 2009 11:05 AM) CC: Cough   CC:  Cough.  Acute Visit History:      The patient complains of cough and sinus problems.  These symptoms began months ago.  She denies eye symptoms, fever, headache, and nasal discharge.  Other comments include: she has tried various OTC antihistamines. unable to afford nasal corticosteroids. reports having an xray a few weeks ago which showed bronchitis. quit tobacco several years ago, lots of second hand tobacco smoke exposure. .        The cough interferes with her sleep.  The character of the cough is described as nonproductive.  She has no history of COPD.  There is no history of wheezing, shortness of breath, respiratory retractions, tachypnea, cyanosis, or interference with oral intake associated with her cough.        She complains of nasal congestion.  The patient has had a past history of sinusitis.  She denies previous sinus surgery or sinusitis in the last 2 months.        Current Medications (verified): 1)  Atenolol 50 Mg  Tabs (Atenolol) .Marland Kitchen.. 1 By Mouth Two Times A Day 2)  Ibuprofen 200 Mg Tabs (Ibuprofen) .... Take Up To Ever 4 Hours As Needed 3)  Hydrochlorothiazide 25 Mg Tabs (Hydrochlorothiazide) .... Take One (1) By Mouth Daily 4)  Benzonatate 100 Mg Caps (Benzonatate) .... Take Up To 3 Times A Day As Needed For Cough. 5)  Ventolin Hfa 108 (90 Base) Mcg/act Aers (Albuterol Sulfate) .... 2 Puffs Qid As Needed 6)  Poly-Dex 3.5-10000-0.1 Susp (Neomycin-Polymyxin-Dexameth) .... 4 Gtts in Affected Ear 4 Times Daily  Allergies (verified): No Known Drug Allergies  Past History:  Past medical history reviewed for relevance to current acute and chronic  problems.  Past Medical History: Reviewed history from 08/15/2007 and no changes required. Did not tolerate prev diuretic or beta-blocker use, Heel Spurs    Physical Exam  General:  obese female. NAD. vitals reviewed.  Eyes:  No corneal or conjunctival inflammation noted. EOMI. Perrla. Vision grossly normal. Ears:  External ear exam shows no significant lesions or deformities.  Otoscopic examination reveals clear canals, tympanic membranes are intact bilaterally without bulging, retraction, inflammation or discharge. Hearing is grossly normal bilaterally. Nose:  External nasal examination shows no deformity or inflammation. Nasal mucosa are pink and moist without lesions or exudates. Mouth:  postnasal drip.   Lungs:  Normal respiratory effort, chest expands symmetrically. Lungs are clear to auscultation, no crackles or wheezes. Heart:  Normal rate and regular rhythm. S1 and S2 normal without gallop, murmur, click, rub or other extra sounds.   Impression & Recommendations:  Problem # 1:  COUGH (ICD-786.2) Assessment Unchanged  ?bronchitis vs. postnasal drip syndrome vs. reflux. encouraged continued use of OTC antihistamines. also recommend use of OTC H2 blocker or PPI. no xray necessary at this point. f/u with PCP if symptoms persist/worsen.   Orders: [facility]- Est Level  3 (19379)  Other Orders: U Preg-[facility] (02409)  Laboratory Results   Urine Tests  Date/Time Received: Oct 16, 2009 11:08 AM  Date/Time Reported: Oct 16, 2009 11:13  AM     Urine HCG: negative Comments: ...........test performed by...........Marland KitchenTerese Door, CMA     Appended Document: ?bronchitis amenorrhea- patient interested in upreg, which was negative.

## 2010-07-07 NOTE — Assessment & Plan Note (Signed)
Summary: f/up from ed visit,tcb   Vital Signs:  Patient profile:   50 year old female Height:      62 inches Weight:      230.5 pounds BMI:     42.31 Temp:     97.8 degrees F oral Pulse rate:   64 / minute BP sitting:   143 / 87  (right arm) Cuff size:   large  Vitals Entered By: Garen Grams LPN (September 24, 2009 10:56 AM) CC: ED f/u for bronchitis Is Patient Diabetic? No Pain Assessment Patient in pain? no        Primary Care Provider:  Tinnie Gens MD  CC:  ED f/u for bronchitis.  History of Present Illness: ER follow up: went to ER in [facility] on 09/21/09 for ear pain in left ear, itching in left ear, pain and pressure on left side of face casuing tooth pain, inability to sleep, cough.  was rx'd with medrol pack, zpack for presumed sinusitis.  states that 1st day after rx she felt much better but now all symptoms have returned.  in addition to the above meds she has been taking her albuterol inhaler up to three times a day (she is unsure if this helps), tussin pills?, allergra and decongestants.  she states that a hot shower int he morning helps her pain/pressure the most.  her symptoms have been going off and on since february 2011.  she has been seen multiple times and had multiple courses of azithromycin.  in the past it has been suggested she go on a  nasal steroid but she has no insurance and cannot afford one.  occasionally as of recently she has felt so congested that she has woken up in the middle of the night gasping for air in a panic attack.  currently her symptoms are worst at night. over all of this time she denies any fevers  Habits & Providers  Alcohol-Tobacco-Diet     Tobacco Status: quit  Current Medications (verified): 1)  Atenolol 50 Mg  Tabs (Atenolol) .Marland Kitchen.. 1 By Mouth Two Times A Day 2)  Ibuprofen 200 Mg Tabs (Ibuprofen) .... Take Up To Ever 4 Hours As Needed 3)  Hydrochlorothiazide 25 Mg Tabs (Hydrochlorothiazide) .... Take One (1) By Mouth Daily 4)   Benzonatate 100 Mg Caps (Benzonatate) .... Take Up To 3 Times A Day As Needed For Cough. 5)  Ventolin Hfa 108 (90 Base) Mcg/act Aers (Albuterol Sulfate) .... 2 Puffs Qid As Needed 6)  Azithromycin 250 Mg Tabs (Azithromycin) .... 2 By Mouth X 1 Then 1 By Mouth Daily  Allergies (verified): No Known Drug Allergies  Past History:  Past medical history reviewed for relevance to current acute and chronic problems. Social history (including risk factors) reviewed for relevance to current acute and chronic problems.  Past Medical History: Reviewed history from 08/15/2007 and no changes required. Did not tolerate prev diuretic or beta-blocker use, Heel Spurs    Social History: Reviewed history from 08/15/2007 and no changes required. Patient single.  Mult sexual partners recently. Currently unemployed and looking for work  Smoking Status:  quit  Review of Systems       per HPI.  did note some post tussive emesis a few days ago but no diarrhea, no abdominal pain.    Physical Exam  General:  alert and overweight-appearing.  VS noted Head:  normocephalic and atraumatic.   Eyes:  wearing glasses Ears:  L ear with dullness to TM, bulging but no  erythema R ear with normal TM but some irritation of canal with erythema. Nose:  mucosal erythema, mucosal edema, L frontal sinus tenderness, L maxillary sinus tenderness, R frontal sinus tenderness, and R maxillary sinus tenderness.   Mouth:  good dentition and pharynx pink and moist.   Neck:  supple.  no LAD Lungs:  normal respiratory effort and normal breath sounds.   Heart:  normal rate and regular rhythm.     Impression & Recommendations:  Problem # 1:  URI (ICD-465.9) Assessment Deteriorated  suspect likely this is still just recurrent URIs probably predicated by allergic rhinitis.  not taking her allegra consistently.   have advised she continue her medrol and zpack - if gets fevers, or worsens drastically could consider change of  rx would like for her to be on nasal steroid but cannot afford - still could considerif not improving at a later date.   also suspect some of her symptoms are worsened by her anxiety - monitor   Her updated medication list for this problem includes:    Ibuprofen 200 Mg Tabs (Ibuprofen) .Marland Kitchen... Take up to ever 4 hours as needed    Benzonatate 100 Mg Caps (Benzonatate) .Marland Kitchen... Take up to 3 times a day as needed for cough.  Orders: [facility]- Est Level  3 (16109)  Complete Medication List: 1)  Atenolol 50 Mg Tabs (Atenolol) .Marland Kitchen.. 1 by mouth two times a day 2)  Ibuprofen 200 Mg Tabs (Ibuprofen) .... Take up to ever 4 hours as needed 3)  Hydrochlorothiazide 25 Mg Tabs (Hydrochlorothiazide) .... Take one (1) by mouth daily 4)  Benzonatate 100 Mg Caps (Benzonatate) .... Take up to 3 times a day as needed for cough. 5)  Ventolin Hfa 108 (90 Base) Mcg/act Aers (Albuterol sulfate) .... 2 puffs qid as needed 6)  Azithromycin 250 Mg Tabs (Azithromycin) .... 2 by mouth x 1 then 1 by mouth daily  Patient Instructions: 1)  Be sure to take your Allergra (and even consider Allegra D) every day. 2)  Finish your medrol dose pack and your zpack that you were given at the hospital.  3)  If you develop high fevers or other worrisome symptoms please call.   4)  If you aren't feeling any better by your visit with Dr Shawnie Pons in May we likely will change your antibiotic.

## 2010-07-07 NOTE — Assessment & Plan Note (Signed)
Summary: sick x2wks,df   Vital Signs:  Patient profile:   50 year old female Height:      62 inches Weight:      231 pounds BMI:     42.40 BSA:     2.03 O2 Sat:      98 % on Room air Temp:     98.1 degrees F Pulse rate:   62 / minute BP sitting:   180 / 84  Vitals Entered By: Jone Baseman CMA (August 08, 2009 10:51 AM)  O2 Flow:  Room air CC: SICK X 2 WEEKS Is Patient Diabetic? No Pain Assessment Patient in pain? no        Primary Care Provider:  Tinnie Gens MD  CC:  SICK X 2 WEEKS.  History of Present Illness: Seen last wk and given tessalon perles.  Feels SOB and is wheezing a lot.  Has cough.  Multiple flu exposures.  No signif. fever or chills.  Has new hoarseness.  No better with mucinex, cold and flu medication. Had peanuts last pm and BP up today.  Was better last wk.   Habits & Providers  Alcohol-Tobacco-Diet     Tobacco Status: quit > 6 months  Current Problems (verified): 1)  Cough  (ICD-786.2) 2)  Edema- Localized  (ICD-782.3) 3)  Allergic Rhinitis  (ICD-477.9) 4)  Otitis Externa, Acute, Left  (ICD-380.12) 5)  Viral Uri  (ICD-465.9) 6)  Arthritis  (ICD-716.90) 7)  Broken Tooth, Infected  (ICD-873.73) 8)  Urinary Frequency  (ICD-788.41) 9)  Adhesive Capsulitis of Shoulder  (ICD-726.0) 10)  Shoulder Pain, Right  (ICD-719.41) 11)  External Hemorrhoids  (ICD-455.3) 12)  Obesity  (ICD-278.00) 13)  Dyspareunia  (ICD-625.0) 14)  Dry Eye Syndrome  (ICD-375.15) 15)  COPD  (ICD-496) 16)  Tobacco Abuse-quit  (ICD-305.1) 17)  Absence of Menstruation  (ICD-626.0) 18)  Candidiasis, Vulva/vagina  (ICD-112.1) 19)  Sexual Activity, High Risk  (ICD-V69.2) 20)  Screening For Malignant Neoplasm, Skin  (ICD-V76.43) 21)  Gynecological Examination, Routine  (ICD-V72.31) 22)  Gerd  (ICD-530.81) 23)  Irritable Bowel Syndrome  (ICD-564.1) 24)  Hypertension, Benign Systemic  (ICD-401.1) 25)  Depression, Major, Recurrent  (ICD-296.30) 26)  Borderline Personality   (ICD-301.83)  Current Medications (verified): 1)  Atenolol 50 Mg  Tabs (Atenolol) .Marland Kitchen.. 1 By Mouth Two Times A Day 2)  Ibuprofen 200 Mg Tabs (Ibuprofen) .... Take Up To Ever 4 Hours As Needed 3)  Hydrochlorothiazide 25 Mg Tabs (Hydrochlorothiazide) .... Take One (1) By Mouth Daily 4)  Benzonatate 100 Mg Caps (Benzonatate) .... Take Up To 3 Times A Day As Needed For Cough. 5)  Ery-Tab 333 Mg Tbec (Erythromycin Base) .Marland Kitchen.. 1 By Mouth Three Times A Day X 10 Days 6)  Ventolin Hfa 108 (90 Base) Mcg/act Aers (Albuterol Sulfate) .... 2 Puffs Qid As Needed  Allergies (verified): No Known Drug Allergies  Past History:  Past Medical History: Last updated: 08/15/2007 Did not tolerate prev diuretic or beta-blocker use, Heel Spurs    Past Surgical History: Last updated: 08/15/2007 Cholecystectomy - 06/07/1997, Tonsillectomy - 06/07/1977    Family History: Last updated: 03/22/2008 Diabetes 1st degree, hypertension   no FH of Rheumatoid  arthritis that she is aware of  Social History: Last updated: 08/15/2007 Patient single.  Mult sexual partners recently. Currently unemployed and looking for work    Risk Factors: Smoking Status: quit > 6 months (08/08/2009) Packs/Day: <1/4 (08/29/2007)  Social History: Smoking Status:  quit > 6 months  Review of  Systems       The patient complains of hoarseness and prolonged cough.  The patient denies anorexia, chest pain, syncope, dyspnea on exertion, peripheral edema, headaches, abdominal pain, melena, hematochezia, and severe indigestion/heartburn.    Physical Exam  General:  alert and overweight-appearing.   Head:  normocephalic and atraumatic.   Neck:  supple.   Lungs:  normal respiratory effort, R wheezes, and L wheezes.   Heart:  normal rate, regular rhythm, and no murmur.   Abdomen:  soft and non-tender.     Impression & Recommendations:  Problem # 1:  COUGH (ICD-786.2)  Orders: Pulse Oximetry- [facility] (94760) [facility]- Est Level  3  (45409)  Problem # 2:  BRONCHITIS, ACUTE (ICD-466.0)  Her updated medication list for this problem includes:    Benzonatate 100 Mg Caps (Benzonatate) .Marland Kitchen... Take up to 3 times a day as needed for cough.    Ery-tab 333 Mg Tbec (Erythromycin base) .Marland Kitchen... 1 by mouth three times a day x 10 days    Ventolin Hfa 108 (90 Base) Mcg/act Aers (Albuterol sulfate) .Marland Kitchen... 2 puffs qid as needed  Orders: [facility]- Est Level  3 (81191)  Medications Added to Medication List This Visit: 1)  Ery-tab 333 Mg Tbec (Erythromycin base) .Marland Kitchen.. 1 by mouth three times a day x 10 days 2)  Ventolin Hfa 108 (90 Base) Mcg/act Aers (Albuterol sulfate) .... 2 puffs qid as needed  Complete Medication List: 1)  Atenolol 50 Mg Tabs (Atenolol) .Marland Kitchen.. 1 by mouth two times a day 2)  Ibuprofen 200 Mg Tabs (Ibuprofen) .... Take up to ever 4 hours as needed 3)  Hydrochlorothiazide 25 Mg Tabs (Hydrochlorothiazide) .... Take one (1) by mouth daily 4)  Benzonatate 100 Mg Caps (Benzonatate) .... Take up to 3 times a day as needed for cough. 5)  Ery-tab 333 Mg Tbec (Erythromycin base) .Marland Kitchen.. 1 by mouth three times a day x 10 days 6)  Ventolin Hfa 108 (90 Base) Mcg/act Aers (Albuterol sulfate) .... 2 puffs qid as needed  Patient Instructions: 1)  Please schedule a follow-up appointment in 2 months.  Prescriptions: VENTOLIN HFA 108 (90 BASE) MCG/ACT AERS (ALBUTEROL SULFATE) 2 puffs qid as needed  #1 x 1   Entered and Authorized by:   Tinnie Gens MD   Signed by:   Tinnie Gens MD on 08/08/2009   Method used:   Electronically to        [facility] [phone]* (retail)       [address]       [place], [place]  [address]       Ph: [phone]       Fax: [phone]   RxID:   [id number] ERY-TAB 333 MG TBEC (ERYTHROMYCIN BASE) 1 by mouth three times a day x 10 days  #30 x 0   Entered and Authorized by:   Tinnie Gens MD   Signed by:   Tinnie Gens MD on 08/08/2009   Method used:   Electronically to        [facility] [phone]* (retail)       [address]       [place], [place]  [address]       Ph: [phone]       Fax: [phone]   RxID:   [id number]

## 2010-07-07 NOTE — Assessment & Plan Note (Signed)
Summary: cough s/p viral illness, hoarse voice   Vital Signs:  Patient profile:   49 year old female Weight:      227.1 pounds O2 Sat:      98 % on Room air Temp:     97.6 degrees F oral Pulse rate:   60 / minute Pulse rhythm:   regular BP sitting:   120 / 83  Vitals Entered By: Loralee Pacas CMA (July 31, 2009 11:01 AM)  O2 Flow:  Room air  Primary Care Provider:  Tinnie Gens MD   History of Present Illness: Pt has been haivng "head cold" symptoms for 1-2 weeks and now has a ressidual cough and hoarse voice. She has 4 sick contacts in her family and friends. No fevers or chills, afebrile today. No other current signs or symptoms of illness. She has been taking Mucinex and some cold and flu preparation for the last week. She is feeling better but her voice continues to be hoarse and she has her daughters wedding this weekend. She wants to know what else she can do for the hoarseness and the cough.   Current Medications (verified): 1)  Atenolol 50 Mg  Tabs (Atenolol) .Marland Kitchen.. 1 By Mouth Two Times A Day 2)  Ibuprofen 200 Mg Tabs (Ibuprofen) .... Take Up To Ever 4 Hours As Needed 3)  Hydrochlorothiazide 25 Mg Tabs (Hydrochlorothiazide) .... Take One (1) By Mouth Daily 4)  Benzonatate 100 Mg Caps (Benzonatate) .... Take Up To 3 Times A Day As Needed For Cough.  Allergies (verified): No Known Drug Allergies  Review of Systems        vitals reviewed and pertinent negatives and positives seen in HPI   Physical Exam  General:  alert, appropriate dress, normal appearance, cooperative to examination, and overweight-appearing.   Eyes:  wearing glasses Ears:  External ear exam shows no significant lesions or deformities.  Otoscopic examination reveals clear canals, tympanic membranes are intact bilaterally without bulging, retraction, inflammation or discharge. Hearing is grossly normal bilaterally. Nose:  External nasal examination shows no deformity or inflammation. Nasal mucosa are  pink and moist without lesions or exudates. Mouth:  Oral mucosa and oropharynx without lesions or exudates.  Teeth in good repair. Neck:  No deformities, masses, or tenderness noted.  Lungs:  Normal respiratory effort, chest expands symmetrically. Lungs are clear to auscultation, no crackles or wheezes. Heart:  Normal rate and regular rhythm. S1 and S2 normal without gallop, murmur, click, rub or other extra sounds.   Impression & Recommendations:  Problem # 1:  COUGH (ICD-786.2) Assessment New Pt has a post-viral illness cough and she is concerned about being able to speak at her daughters wedding this weekend. Advised pt to save her voice and not speak much the next few days. Use the Tessalon Perrles to decrease her coughing. And drink plenty of water and warm tea.    Orders: [facility]- Est Level  3 ([address])  Problem # 2:  VIRAL URI (ICD-465.9) Assessment: Improved PT had a viral uri for 1-2 weeks. She is improving now but still has a cough and a hoarse voice.   The following medications were removed from the medication list:    Naprosyn 500 Mg Tabs (Naproxen) .Marland Kitchen... 1 by mouth two times a day w/ food    Zyrtec Allergy 10 Mg Tabs (Cetirizine hcl) ..... One by mouth daily Her updated medication list for this problem includes:    Ibuprofen 200 Mg Tabs (Ibuprofen) .Marland Kitchen... Take up to ever 4  hours as needed    Benzonatate 100 Mg Caps (Benzonatate) .Marland Kitchen... Take up to 3 times a day as needed for cough.  Orders: [facility]- Est Level  3 (09811)  Complete Medication List: 1)  Atenolol 50 Mg Tabs (Atenolol) .Marland Kitchen.. 1 by mouth two times a day 2)  Ibuprofen 200 Mg Tabs (Ibuprofen) .... Take up to ever 4 hours as needed 3)  Hydrochlorothiazide 25 Mg Tabs (Hydrochlorothiazide) .... Take one (1) by mouth daily 4)  Benzonatate 100 Mg Caps (Benzonatate) .... Take up to 3 times a day as needed for cough.  Patient Instructions: 1)  You appear to have a post-viral cough. 2)  You can continue to use the Mucinex. 3)   If you can not get the Tesslon Perrles then try the Robitussin DM to help with the coughing.  4)  I hope the wedding is awesome!   Prescriptions: BENZONATATE 100 MG CAPS (BENZONATATE) take up to 3 times a day as needed for cough.  #21 x 1   Entered and Authorized by:   Jamie Brookes MD   Signed by:   Jamie Brookes MD on 07/31/2009   Method used:   Electronically to        [facility] #[id number]* (retail)       [address]       [place], [place]  [address]       Ph: [phone]       Fax: [phone]   RxID:   [id number]

## 2010-07-07 NOTE — Assessment & Plan Note (Signed)
Summary: sinus pain,df   Vital Signs:  Patient profile:   51 year old female Height:      62 inches Weight:      228 pounds BMI:     41.85 BSA:     2.02 Temp:     98.6 degrees F Pulse rate:   59 / minute BP sitting:   131 / 81  Vitals Entered By: Jone Baseman CMA (August 29, 2009 10:29 AM) CC: sinus issues, URI symptoms Is Patient Diabetic? No Pain Assessment Patient in pain? yes     Location: head Intensity: 5   Primary Care Provider:  Tinnie Gens MD  CC:  sinus issues and URI symptoms.  History of Present Illness:       This is a 50 year old woman who presents with URI symptoms.  The patient complains of nasal congestion, purulent nasal discharge, sore throat, and earache, but denies clear nasal discharge, dry cough, productive cough, and sick contacts.  Associated symptoms include low-grade fever (<100.5 degrees).  The patient denies stiff neck and wheezing.  The patient also reports itchy watery eyes, sneezing, seasonal symptoms, and headache.  The patient denies response to antihistamine, muscle aches, and severe fatigue.  Risk factors for Strep sinusitis include unilateral facial pain, poor response to decongestant, double sickening, and tooth pain.  Previously seen twice and treated symptomatically and placed on E-mycin.  Still not improving but sinus pain and pressure is most note-worthy.    Habits & Providers  Alcohol-Tobacco-Diet     Tobacco Status: never  Current Problems (verified): 1)  Edema- Localized  (ICD-782.3) 2)  Allergic Rhinitis  (ICD-477.9) 3)  Otitis Externa, Acute, Left  (ICD-380.12) 4)  Viral Uri  (ICD-465.9) 5)  Arthritis  (ICD-716.90) 6)  Broken Tooth, Infected  (ICD-873.73) 7)  Urinary Frequency  (ICD-788.41) 8)  Adhesive Capsulitis of Shoulder  (ICD-726.0) 9)  Shoulder Pain, Right  (ICD-719.41) 10)  External Hemorrhoids  (ICD-455.3) 11)  Obesity  (ICD-278.00) 12)  Dyspareunia  (ICD-625.0) 13)  Dry Eye Syndrome  (ICD-375.15) 14)  COPD   (ICD-496) 15)  Tobacco Abuse-quit  (ICD-305.1) 16)  Absence of Menstruation  (ICD-626.0) 17)  Candidiasis, Vulva/vagina  (ICD-112.1) 18)  Sexual Activity, High Risk  (ICD-V69.2) 19)  Screening For Malignant Neoplasm, Skin  (ICD-V76.43) 20)  Gynecological Examination, Routine  (ICD-V72.31) 21)  Gerd  (ICD-530.81) 22)  Irritable Bowel Syndrome  (ICD-564.1) 23)  Hypertension, Benign Systemic  (ICD-401.1) 24)  Depression, Major, Recurrent  (ICD-296.30) 25)  Borderline Personality  (ICD-301.83)  Current Medications (verified): 1)  Atenolol 50 Mg  Tabs (Atenolol) .Marland Kitchen.. 1 By Mouth Two Times A Day 2)  Ibuprofen 200 Mg Tabs (Ibuprofen) .... Take Up To Ever 4 Hours As Needed 3)  Hydrochlorothiazide 25 Mg Tabs (Hydrochlorothiazide) .... Take One (1) By Mouth Daily 4)  Benzonatate 100 Mg Caps (Benzonatate) .... Take Up To 3 Times A Day As Needed For Cough. 5)  Ery-Tab 333 Mg Tbec (Erythromycin Base) .Marland Kitchen.. 1 By Mouth Three Times A Day X 10 Days 6)  Ventolin Hfa 108 (90 Base) Mcg/act Aers (Albuterol Sulfate) .... 2 Puffs Qid As Needed 7)  Nasonex 50 Mcg/act Susp (Mometasone Furoate) .... 2 Sprays Ea. Nostril Daily  Allergies (verified): No Known Drug Allergies  Past History:  Past Medical History: Last updated: 08/15/2007 Did not tolerate prev diuretic or beta-blocker use, Heel Spurs    Past Surgical History: Last updated: 08/15/2007 Cholecystectomy - 06/07/1997, Tonsillectomy - 06/07/1977    Family History: Last updated:  03/22/2008 Diabetes 1st degree, hypertension   no FH of Rheumatoid  arthritis that she is aware of  Social History: Last updated: 08/15/2007 Patient single.  Mult sexual partners recently. Currently unemployed and looking for work    Risk Factors: Smoking Status: never (08/29/2009) Packs/Day: <1/4 (08/29/2007)  Social History: Smoking Status:  never  Review of Systems       The patient complains of headaches.  The patient denies anorexia, weight loss, weight gain,  chest pain, syncope, dyspnea on exertion, peripheral edema, prolonged cough, and severe indigestion/heartburn.    Physical Exam  General:  alert and overweight-appearing.   Head:  normocephalic and atraumatic.   Ears:  R ear normal and L ear normal.   Nose:  mucosal erythema, mucosal edema, L frontal sinus tenderness, L maxillary sinus tenderness, R frontal sinus tenderness, and R maxillary sinus tenderness.   Mouth:  good dentition and pharynx pink and moist.   Neck:  supple.   Lungs:  normal respiratory effort and normal breath sounds.   Heart:  normal rate and regular rhythm.   Abdomen:  soft and non-tender.     Impression & Recommendations:  Problem # 1:  ALLERGIC RHINITIS (ICD-477.9)  Pollen avoidance. Daily OTC anti-histamine use Mucinex as needed  The following medications were removed from the medication list:    Nasonex 50 Mcg/act Susp (Mometasone furoate) .Marland Kitchen... 2 sprays ea. nostril daily  Orders: [facility]- Est Level  3 (60454)  Problem # 2:  SINUSITIS, ACUTE (ICD-461.9)  The following medications were removed from the medication list:    Ery-tab 333 Mg Tbec (Erythromycin base) .Marland Kitchen... 1 by mouth three times a day x 10 days    Nasonex 50 Mcg/act Susp (Mometasone furoate) .Marland Kitchen... 2 sprays ea. nostril daily Her updated medication list for this problem includes:    Benzonatate 100 Mg Caps (Benzonatate) .Marland Kitchen... Take up to 3 times a day as needed for cough.    Azithromycin 250 Mg Tabs (Azithromycin) .Marland Kitchen... 2 by mouth x 1 then 1 by mouth daily  Orders: [facility]- Est Level  3 (09811)  Complete Medication List: 1)  Atenolol 50 Mg Tabs (Atenolol) .Marland Kitchen.. 1 by mouth two times a day 2)  Ibuprofen 200 Mg Tabs (Ibuprofen) .... Take up to ever 4 hours as needed 3)  Hydrochlorothiazide 25 Mg Tabs (Hydrochlorothiazide) .... Take one (1) by mouth daily 4)  Benzonatate 100 Mg Caps (Benzonatate) .... Take up to 3 times a day as needed for cough. 5)  Ventolin Hfa 108 (90 Base) Mcg/act Aers (Albuterol  sulfate) .... 2 puffs qid as needed 6)  Azithromycin 250 Mg Tabs (Azithromycin) .... 2 by mouth x 1 then 1 by mouth daily  Patient Instructions: 1)  Please schedule a follow-up appointment as needed .  Prescriptions: AZITHROMYCIN 250 MG TABS (AZITHROMYCIN) 2 by mouth x 1 then 1 by mouth daily  #6 x 0   Entered and Authorized by:   Tinnie Gens MD   Signed by:   Tinnie Gens MD on 08/29/2009   Method used:   Electronically to        [facility] [phone]* (retail)       [address]       [place], [place]  [address]       Ph: [phone]       Fax: [phone]   RxID:   [id number]

## 2010-07-07 NOTE — Progress Notes (Signed)
Summary: Triage  Phone Note Call from Patient Call back at [phone]    Reason for Call: Talk to Nurse Summary of Call: seen 1 wk ago for bronchitis but now huring in her cheek and head, thinks she needs a z-pac Initial call taken by: Knox Royalty,  August 18, 2009 8:47 AM  Follow-up for Phone Call        she wants a different antibiotic since the one she is on is not helping. states she still feels bad & has sinus & face pain & pressure as well. to pcp to see if she will change meds or if she needs to be seen. told her I will call her back after I hear back from pcp Follow-up by: Golden Circle RN,  August 18, 2009 8:49 AM

## 2010-07-07 NOTE — Miscellaneous (Signed)
Summary: wants less costly med  Clinical Lists Changes the ear drops are too expensive. wants something less costly rxd. to pcp.Golden Circle RN  Oct 07, 2009 3:45 PM  Medications: Changed medication from CORTISPORIN-TC 3.08-07-08-0.5 MG/ML SUSP (NEOMYCIN-COLIST-HC-THONZONIUM) 3-4 gtt affected ear three times a day to POLY-DEX 3.5-10000-0.1 SUSP (NEOMYCIN-POLYMYXIN-DEXAMETH) 4 gtts in affected ear 4 times daily - Signed Rx of POLY-DEX 3.5-10000-0.1 SUSP (NEOMYCIN-POLYMYXIN-DEXAMETH) 4 gtts in affected ear 4 times daily;  #1 bottle x 1;  Signed;  Entered by: Tinnie Gens MD;  Authorized by: Tinnie Gens MD;  Method used: Electronically to [facility] [address] [id number]*, [address] [place], [place]  [address], Ph: [phone], Fax: [phone]    Prescriptions: POLY-DEX 3.5-10000-0.1 SUSP (NEOMYCIN-POLYMYXIN-DEXAMETH) 4 gtts in affected ear 4 times daily  #1 bottle x 1   Entered and Authorized by:   Tinnie Gens MD   Signed by:   Tinnie Gens MD on 10/07/2009   Method used:   Electronically to        [facility] [phone]* (retail)       [address]       [place], [place]  [address]       Ph: [phone]       Fax: [phone]   RxID:   [id number]

## 2010-07-07 NOTE — Assessment & Plan Note (Signed)
Summary: f/u meds,df   Vital Signs:  Patient profile:   50 year old female Height:      62.75 inches Weight:      229.4 pounds BMI:     41.11 Temp:     98.6 degrees F oral Pulse rate:   75 / minute BP sitting:   158 / 85  (left arm) Cuff size:   large  Vitals Entered By: Garen Grams LPN (January 29, 2010 2:02 PM) CC: f/u meds Is Patient Diabetic? No Pain Assessment Patient in pain? no        Primary Care Provider:  Tinnie Gens MD  CC:  f/u meds.  History of Present Illness:  Hypertension follow-up      This is a 50 year old woman who presents for Hypertension follow-up.  The patient denies urinary frequency, edema, and fatigue.  The patient denies the following associated symptoms: chest pain, dyspnea, syncope, and leg edema.  Compliance with medications (by patient report) has been near 100%.  The patient reports that dietary compliance has been poor.  The patient reports exercising occasionally.    Habits & Providers  Alcohol-Tobacco-Diet     Tobacco Status: quit  Current Problems (verified): 1)  Health Maintenance Exam  (ICD-V70.0) 2)  Oligomenorrhea  (ICD-626.1) 3)  Edema- Localized  (ICD-782.3) 4)  Allergic Rhinitis  (ICD-477.9) 5)  Arthritis  (ICD-716.90) 6)  Urinary Frequency  (ICD-788.41) 7)  Adhesive Capsulitis of Shoulder  (ICD-726.0) 8)  Shoulder Pain, Right  (ICD-719.41) 9)  External Hemorrhoids  (ICD-455.3) 10)  Obesity  (ICD-278.00) 11)  Dyspareunia  (ICD-625.0) 12)  Dry Eye Syndrome  (ICD-375.15) 13)  COPD  (ICD-496) 14)  Tobacco Abuse-quit  (ICD-305.1) 15)  Sexual Activity, High Risk  (ICD-V69.2) 16)  Gerd  (ICD-530.81) 17)  Irritable Bowel Syndrome  (ICD-564.1) 18)  Hypertension, Benign Systemic  (ICD-401.1) 19)  Depression, Major, Recurrent  (ICD-296.30) 20)  Borderline Personality  (ICD-301.83)  Current Medications (verified): 1)  Atenolol 50 Mg  Tabs (Atenolol) .Marland Kitchen.. 1 By Mouth Two Times A Day 2)  Ibuprofen 200 Mg Tabs (Ibuprofen) ....  Take Up To Ever 4 Hours As Needed 3)  Hydrochlorothiazide 25 Mg Tabs (Hydrochlorothiazide) .... Take One (1) By Mouth Daily 4)  Benzonatate 100 Mg Caps (Benzonatate) .... Take Up To 3 Times A Day As Needed For Cough. 5)  Ventolin Hfa 108 (90 Base) Mcg/act Aers (Albuterol Sulfate) .... 2 Puffs Qid As Needed 6)  Poly-Dex 3.5-10000-0.1 Susp (Neomycin-Polymyxin-Dexameth) .... 4 Gtts in Affected Ear 4 Times Daily  Allergies (verified): No Known Drug Allergies  Past History:  Past Medical History: Last updated: 08/15/2007 Did not tolerate prev diuretic or beta-blocker use, Heel Spurs    Past Surgical History: Last updated: 08/15/2007 Cholecystectomy - 06/07/1997, Tonsillectomy - 06/07/1977    Family History: Last updated: 03/22/2008 Diabetes 1st degree, hypertension   no FH of Rheumatoid  arthritis that she is aware of  Social History: Last updated: 08/15/2007 Patient single.  Mult sexual partners recently. Currently unemployed and looking for work    Risk Factors: Exercise: no (10/06/2009)  Risk Factors: Smoking Status: quit (01/29/2010) Packs/Day: <1/4 (08/29/2007)  Review of Systems  The patient denies anorexia, fever, hoarseness, chest pain, syncope, dyspnea on exertion, peripheral edema, headaches, abdominal pain, hematochezia, and severe indigestion/heartburn.         Has not had period for 6 wks  Physical Exam  General:  alert and overweight-appearing.   Head:  normocephalic and atraumatic.   Neck:  supple.  Lungs:  normal respiratory effort and normal breath sounds.   Heart:  normal rate, regular rhythm, and no gallop.   Abdomen:  soft and non-tender.     Impression & Recommendations:  Problem # 1:  AMENORRHEA (ICD-626.0)  Orders: U Preg-[facility] (81025) [facility]- Est Level  3 (16109)  Problem # 2:  OBESITY (ICD-278.00)  Orders: [facility]- Est Level  3 (60454)  Problem # 3:  HYPERTENSION, BENIGN SYSTEMIC (ICD-401.1)  Her updated medication list for this problem  includes:    Atenolol 50 Mg Tabs (Atenolol) .Marland Kitchen... 1 by mouth two times a day    Hydrochlorothiazide 25 Mg Tabs (Hydrochlorothiazide) .Marland Kitchen... Take one (1) by mouth daily  Orders: [facility]- Est Level  3 (09811)  Complete Medication List: 1)  Atenolol 50 Mg Tabs (Atenolol) .Marland Kitchen.. 1 by mouth two times a day 2)  Ibuprofen 200 Mg Tabs (Ibuprofen) .... Take up to ever 4 hours as needed 3)  Hydrochlorothiazide 25 Mg Tabs (Hydrochlorothiazide) .... Take one (1) by mouth daily 4)  Benzonatate 100 Mg Caps (Benzonatate) .... Take up to 3 times a day as needed for cough. 5)  Ventolin Hfa 108 (90 Base) Mcg/act Aers (Albuterol sulfate) .... 2 puffs qid as needed 6)  Poly-dex 3.5-10000-0.1 Susp (Neomycin-polymyxin-dexameth) .... 4 gtts in affected ear 4 times daily  Patient Instructions: 1)  Please schedule a follow-up appointment in 6 months .  Prescriptions: ATENOLOL 50 MG  TABS (ATENOLOL) 1 by mouth two times a day  #60 x 5   Entered and Authorized by:   Tinnie Gens MD   Signed by:   Tinnie Gens MD on 01/29/2010   Method used:   Electronically to        [facility] [phone]* (retail)       [address]       [place], [place]  [address]       Ph: [phone]       Fax: [phone]   RxID:   [id number] HYDROCHLOROTHIAZIDE 25 MG TABS (HYDROCHLOROTHIAZIDE) Take one (1) by mouth daily  #30 x 5   Entered and Authorized by:   Tinnie Gens MD   Signed by:   Tinnie Gens MD on 01/29/2010   Method used:   Electronically to        [facility] [phone]* (retail)       [address]       [place], [place]  [address]       Ph: [phone]       Fax: [phone]   RxID:   [id number]   Laboratory Results   Urine Tests  Date/Time Received: January 29, 2010 2:04 PM  Date/Time Reported: January 29, 2010 2:08 PM     Urine HCG: negative Comments: ...........test performed by...........Marland KitchenTerese Door, CMA

## 2010-07-07 NOTE — Assessment & Plan Note (Signed)
Summary: sinus pressure & pain. not feeling better from OV 10 days ago/[place]   Vital Signs:  Patient profile:   50 year old female Height:      62 inches Weight:      229 pounds BMI:     42.04 BSA:     2.03 Temp:     98.6 degrees F Pulse rate:   62 / minute BP sitting:   149 / 86  Vitals Entered By: Jone Baseman CMA (August 18, 2009 11:07 AM) CC: ? sinus infection Is Patient Diabetic? No Pain Assessment Patient in pain? yes     Location: face Intensity: 6   Primary Care Provider:  Tinnie Gens MD  CC:  ? sinus infection.  History of Present Illness: Returns still feeling sick.  Has had signs' x 3 wks.  has been treated symptomatically, and with E-mycin.  She has only been on meds for 4 days.  has not changed her toothbrush.  Now with less cough and wheezing, but faical pressure and sinus pain.   Habits & Providers  Alcohol-Tobacco-Diet     Tobacco Status: quit  Current Problems (verified): 1)  Edema- Localized  (ICD-782.3) 2)  Allergic Rhinitis  (ICD-477.9) 3)  Otitis Externa, Acute, Left  (ICD-380.12) 4)  Viral Uri  (ICD-465.9) 5)  Arthritis  (ICD-716.90) 6)  Broken Tooth, Infected  (ICD-873.73) 7)  Urinary Frequency  (ICD-788.41) 8)  Adhesive Capsulitis of Shoulder  (ICD-726.0) 9)  Shoulder Pain, Right  (ICD-719.41) 10)  External Hemorrhoids  (ICD-455.3) 11)  Obesity  (ICD-278.00) 12)  Dyspareunia  (ICD-625.0) 13)  Dry Eye Syndrome  (ICD-375.15) 14)  COPD  (ICD-496) 15)  Tobacco Abuse-quit  (ICD-305.1) 16)  Absence of Menstruation  (ICD-626.0) 17)  Candidiasis, Vulva/vagina  (ICD-112.1) 18)  Sexual Activity, High Risk  (ICD-V69.2) 19)  Screening For Malignant Neoplasm, Skin  (ICD-V76.43) 20)  Gynecological Examination, Routine  (ICD-V72.31) 21)  Gerd  (ICD-530.81) 22)  Irritable Bowel Syndrome  (ICD-564.1) 23)  Hypertension, Benign Systemic  (ICD-401.1) 24)  Depression, Major, Recurrent  (ICD-296.30) 25)  Borderline Personality  (ICD-301.83)  Current  Medications (verified): 1)  Atenolol 50 Mg  Tabs (Atenolol) .Marland Kitchen.. 1 By Mouth Two Times A Day 2)  Ibuprofen 200 Mg Tabs (Ibuprofen) .... Take Up To Ever 4 Hours As Needed 3)  Hydrochlorothiazide 25 Mg Tabs (Hydrochlorothiazide) .... Take One (1) By Mouth Daily 4)  Benzonatate 100 Mg Caps (Benzonatate) .... Take Up To 3 Times A Day As Needed For Cough. 5)  Ery-Tab 333 Mg Tbec (Erythromycin Base) .Marland Kitchen.. 1 By Mouth Three Times A Day X 10 Days 6)  Ventolin Hfa 108 (90 Base) Mcg/act Aers (Albuterol Sulfate) .... 2 Puffs Qid As Needed 7)  Nasonex 50 Mcg/act Susp (Mometasone Furoate) .... 2 Sprays Ea. Nostril Daily  Allergies (verified): No Known Drug Allergies  Past History:  Past Medical History: Last updated: 08/15/2007 Did not tolerate prev diuretic or beta-blocker use, Heel Spurs    Past Surgical History: Last updated: 08/15/2007 Cholecystectomy - 06/07/1997, Tonsillectomy - 06/07/1977    Family History: Last updated: 03/22/2008 Diabetes 1st degree, hypertension   no FH of Rheumatoid  arthritis that she is aware of  Social History: Last updated: 08/15/2007 Patient single.  Mult sexual partners recently. Currently unemployed and looking for work    Risk Factors: Smoking Status: quit (08/18/2009) Packs/Day: <1/4 (08/29/2007)  Social History: Smoking Status:  quit  Review of Systems       The patient complains of hoarseness, prolonged cough,  and headaches.  The patient denies anorexia, chest pain, abdominal pain, hematochezia, and severe indigestion/heartburn.    Physical Exam  General:  alert, well-developed, and well-nourished.   Head:  normocephalic and atraumatic.   Neck:  supple.   Lungs:  normal respiratory effort, normal breath sounds, and no wheezes.   Heart:  normal rate, regular rhythm, no murmur, no gallop, and no rub.   Abdomen:  soft and non-tender.   Neurologic:  alert & oriented X3.     Impression & Recommendations:  Problem # 1:  ALLERGIC RHINITIS  (ICD-477.9)  Her updated medication list for this problem includes:    Nasonex 50 Mcg/act Susp (Mometasone furoate) .Marland Kitchen... 2 sprays ea. nostril daily  Orders: [facility]- Est Level  3 (45409)  Problem # 2:  VIRAL URI (ICD-465.9)  Her updated medication list for this problem includes:    Ibuprofen 200 Mg Tabs (Ibuprofen) .Marland Kitchen... Take up to ever 4 hours as needed    Benzonatate 100 Mg Caps (Benzonatate) .Marland Kitchen... Take up to 3 times a day as needed for cough.  Orders: [facility]- Est Level  3 (81191)  Complete Medication List: 1)  Atenolol 50 Mg Tabs (Atenolol) .Marland Kitchen.. 1 by mouth two times a day 2)  Ibuprofen 200 Mg Tabs (Ibuprofen) .... Take up to ever 4 hours as needed 3)  Hydrochlorothiazide 25 Mg Tabs (Hydrochlorothiazide) .... Take one (1) by mouth daily 4)  Benzonatate 100 Mg Caps (Benzonatate) .... Take up to 3 times a day as needed for cough. 5)  Ery-tab 333 Mg Tbec (Erythromycin base) .Marland Kitchen.. 1 by mouth three times a day x 10 days 6)  Ventolin Hfa 108 (90 Base) Mcg/act Aers (Albuterol sulfate) .... 2 puffs qid as needed 7)  Nasonex 50 Mcg/act Susp (Mometasone furoate) .... 2 sprays ea. nostril daily  Patient Instructions: 1)  Please schedule a follow-up appointment as needed .  Prescriptions: NASONEX 50 MCG/ACT SUSP (MOMETASONE FUROATE) 2 sprays ea. nostril daily  #1 x 1   Entered and Authorized by:   Tinnie Gens MD   Signed by:   Tinnie Gens MD on 08/18/2009   Method used:   Electronically to        [facility] [phone]* (retail)       [address]       [place], [place]  [address]       Ph: [phone]       Fax: [phone]   RxID:   [id number]

## 2010-07-07 NOTE — Miscellaneous (Signed)
  Clinical Lists Changes  Problems: Removed problem of SINUSITIS (ICD-473.9) Removed problem of HEALTH MAINTENANCE EXAM (ICD-V70.0) Removed problem of OLIGOMENORRHEA (ICD-626.1) Removed problem of EDEMA- LOCALIZED (ICD-782.3) Removed problem of EXTERNAL HEMORRHOIDS (ICD-455.3) Removed problem of DYSPAREUNIA (ICD-625.0) Removed problem of TOBACCO ABUSE-QUIT (ICD-305.1) Removed problem of SEXUAL ACTIVITY, HIGH RISK (ICD-V69.2) Removed problem of DRY EYE SYNDROME (ICD-375.15) Removed problem of SHOULDER PAIN, RIGHT (ICD-719.41) Removed problem of URINARY FREQUENCY (ICD-788.41) Removed problem of AMENORRHEA (ICD-626.0)

## 2010-07-07 NOTE — Progress Notes (Signed)
Summary: triage  Phone Note Call from Patient Call back at [phone]   Caller: Patient Summary of Call: Pt has yeast infection and wondering if she can be seen tomorrow. Initial call taken by: Clydell Hakim,  November 26, 2009 3:49 PM  Follow-up for Phone Call        has been using multiple OTC for c/o yeast infection.  feeling somewhat better. will be in [place] tomorrow. offered appt. states she is going to wait until after the wkend . then said she may use [facility] if worse or call us in am. so we will wait to hear from her Follow-up by: Golden Circle RN,  November 26, 2009 3:52 PM

## 2010-07-07 NOTE — Letter (Signed)
Summary: [facility] Lipid Letter  All     ,     Phone:   Fax:     10/07/2009  Carmen [name] [address] [name], [place]  [address]  Dear Carmen [name]:  We have carefully reviewed your last lipid profile from 10/06/2009 and the results are noted below with a summary of recommendations for lipid management.    Cholesterol:       175     Goal: < 200   HDL "good" Cholesterol:    46     Goal: > 40   LDL "bad" Cholesterol:    96     Goal: < 100   Triglycerides:    167    Goal: < 150    TLC Diet (Therapeutic Lifestyle Change): Saturated Fats & Transfatty acids should be kept < 7% of total calories   ***Reduce Saturated Fats Total Fat should be no greater than 25-35% of total calories Carbohydrates should be 50-60% of total calories Protein should be approximately 15% of total calories Fiber should be at least 20-30 grams a day  ***Increased fiber may help lower LDL Consider adding plant stanol/sterols to diet (example: Benacol spread) ***A higher intake of unsaturated fat may reduce Triglycerides and Increase HDL   Adjunctive Measures (may lower LIPIDS and reduce risk of Heart Attack) include: Aerobic Exercise (20-30 minutes 3-4 times a week) Limit Alcohol Consumption Weight Reduction Aspirin 75-81 mg a day by mouth (if not allergic or contraindicated) Folic Acid 1mg  a day by mouth  Other labs including blood counts, kidney and liver tests and electrolytes are all normal.  If you have any questions, please call. We appreciate being able to work with you.   Sincerely,     Tinnie Gens MD       Appended Document: [facility] Lipid Letter mailed

## 2010-07-07 NOTE — Assessment & Plan Note (Signed)
Summary: congestion,tcb   Vital Signs:  Patient profile:   50 year old female Height:      62.75 inches Weight:      228 pounds BMI:     40.86 BSA:     2.04 Temp:     98.9 degrees F Pulse rate:   67 / minute BP sitting:   138 / 87  Vitals Entered By: Jone Baseman CMA (March 04, 2010 10:10 AM) CC: congestion x 3 weeks Is Patient Diabetic? Yes Did you bring your meter with you today? No Pain Assessment Patient in pain? no        Primary Care Lillis Nuttle:  Tinnie Gens MD  CC:  congestion x 3 weeks.  History of Present Illness: 1. Congestion:  Pt has had problems with sinus congestion and cough off and on for the past couple of years.  Seems to happen at the same time each year.  Most recently she has been dealing with sinus congestion, some post nasal drip, and a cough for the past 3-4 weeks.  She thinks that it is most likely related to allergies or to 2nd hand smoke exposure.  There have also been a lot of viral URI sick contacts recently.  Overall it is not that bothersome to the patient.  She may be starting a new job in a couple of weeks and would like to be feeling better by then.  Habits & Providers  Alcohol-Tobacco-Diet     Tobacco Status: quit     Tobacco Counseling: to remain off tobacco products     Cigarette Packs/Day: <1/4     Year Quit: 7/09  Current Medications (verified): 1)  Atenolol 50 Mg  Tabs (Atenolol) .Marland Kitchen.. 1 By Mouth Two Times A Day 2)  Ibuprofen 200 Mg Tabs (Ibuprofen) .... Take Up To Ever 4 Hours As Needed 3)  Hydrochlorothiazide 25 Mg Tabs (Hydrochlorothiazide) .... Take One (1) By Mouth Daily 4)  Benzonatate 100 Mg Caps (Benzonatate) .... Take Up To 3 Times A Day As Needed For Cough. 5)  Ventolin Hfa 108 (90 Base) Mcg/act Aers (Albuterol Sulfate) .... 2 Puffs Qid As Needed 6)  Poly-Dex 3.5-10000-0.1 Susp (Neomycin-Polymyxin-Dexameth) .... 4 Gtts in Affected Ear 4 Times Daily 7)  Fluticasone Propionate 50 Mcg/act Susp (Fluticasone Propionate)  .Marland Kitchen.. 1 Spray Each Nostril Once A Day  Allergies: No Known Drug Allergies  Social History: Reviewed history from 08/15/2007 and no changes required. Patient single. Currently unemployed and looking for work.  2nd hand smoke exposure.  Review of Systems  The patient denies fever, weight loss, chest pain, syncope, dyspnea on exertion, and peripheral edema.    Physical Exam  General:  vitals reviewed.  alert and overweight-appearing.   Head:  normocephalic and atraumatic.   Eyes:  normal conjunctiva Ears:  R ear with cerumen occluding 75% of ear canal.  L ear with cerumen but TM visualized and normal Nose:  no external deformity and no nasal discharge.  No sinus pain or pressure.  Turbinates not red or swollen Mouth:  pharynx pink and moist.   Neck:  supple and no masses.   Lungs:  normal respiratory effort, normal breath sounds, no crackles, and no wheezes.   Heart:  normal rate, regular rhythm, and no murmur.   Extremities:  no lower extremity edema   Impression & Recommendations:  Problem # 1:  SINUSITIS (ICD-473.9) Assessment New  Likely related to allergies, 2nd hand smoke exposure, and possible viral URI.  Will treat with supportive measures.  Added Flonase.   Her updated medication list for this problem includes:    Benzonatate 100 Mg Caps (Benzonatate) .Marland Kitchen... Take up to 3 times a day as needed for cough.    Fluticasone Propionate 50 Mcg/act Susp (Fluticasone propionate) .Marland Kitchen... 1 spray each nostril once a day  Orders: [facility]- Est Level  3 (04540)  Complete Medication List: 1)  Atenolol 50 Mg Tabs (Atenolol) .Marland Kitchen.. 1 by mouth two times a day 2)  Ibuprofen 200 Mg Tabs (Ibuprofen) .... Take up to ever 4 hours as needed 3)  Hydrochlorothiazide 25 Mg Tabs (Hydrochlorothiazide) .... Take one (1) by mouth daily 4)  Benzonatate 100 Mg Caps (Benzonatate) .... Take up to 3 times a day as needed for cough. 5)  Ventolin Hfa 108 (90 Base) Mcg/act Aers (Albuterol sulfate) .... 2 puffs qid  as needed 6)  Poly-dex 3.5-10000-0.1 Susp (Neomycin-polymyxin-dexameth) .... 4 gtts in affected ear 4 times daily 7)  Fluticasone Propionate 50 Mcg/act Susp (Fluticasone propionate) .Marland Kitchen.. 1 spray each nostril once a day  Patient Instructions: 1)  It was nice to meet you today 2)  I think that you are having a combination of things that are affecting your lungs and sinuses.  You likely have allergies, smoke exposure, and a viral infection. 3)  For treatment, continue taking the anti-histamine.  I have also provided a prescription for a nasal steroid.  This can help if it is not too expensive. 4)  Continue to work on your boyfriend's smoking because you will likely never really feel better until you stop getting exposed to smoke 5)  Please schedule a follow up appointment in 3 weeks if not better Prescriptions: FLUTICASONE PROPIONATE 50 MCG/ACT SUSP (FLUTICASONE PROPIONATE) 1 spray each nostril once a day  #1 x 3   Entered and Authorized by:   Angelena Sole MD   Signed by:   Angelena Sole MD on 03/04/2010   Method used:   Print then Give to Patient   RxID:   [id number]

## 2010-07-09 NOTE — Assessment & Plan Note (Signed)
Summary: resch'd carpal tunnel/bmc   Vital Signs:  Patient profile:   50 year old female Height:      62.75 inches Weight:      227 pounds BMI:     40.68 Temp:     98.6 degrees F oral Pulse rate:   60 / minute BP sitting:   132 / 85  (left arm) Cuff size:   regular  Vitals Entered By: Garen Grams LPN (June 10, 2010 1:40 PM) CC: pain from carpal tunnel/arthritis Is Patient Diabetic? No   Primary Care Provider:  Tinnie Gens MD  CC:  pain from carpal tunnel/arthritis.  History of Present Illness: Here today for arm pain.  Has h/o severe shoulder arthritis and carpal tunnel syndrome.  Has started new job at [employer] and has had increasing pain.  Reports taking ibuprofen with some relief.  Has had PT and wrist splints in past.  Cannot get into ortho secondary to no insurance.  Habits & Providers  Alcohol-Tobacco-Diet     Tobacco Status: quit     Tobacco Counseling: to remain off tobacco products     Cigarette Packs/Day: <1/4     Year Quit: 7/09  Current Problems (verified): 1)  Allergic Rhinitis  (ICD-477.9) 2)  Arthritis  (ICD-716.90) 3)  Adhesive Capsulitis of Shoulder  (ICD-726.0) 4)  Obesity  (ICD-278.00) 5)  COPD  (ICD-496) 6)  Gerd  (ICD-530.81) 7)  Irritable Bowel Syndrome  (ICD-564.1) 8)  Hypertension, Benign Systemic  (ICD-401.1) 9)  Depression, Major, Recurrent  (ICD-296.30) 10)  Borderline Personality  (ICD-301.83)  Current Medications (verified): 1)  Atenolol 50 Mg  Tabs (Atenolol) .Marland Kitchen.. 1 By Mouth Two Times A Day 2)  Ibuprofen 200 Mg Tabs (Ibuprofen) .... Take Up To Ever 4 Hours As Needed 3)  Hydrochlorothiazide 25 Mg Tabs (Hydrochlorothiazide) .... Take One (1) By Mouth Daily 4)  Ranitidine Hcl 150 Mg Caps (Ranitidine Hcl) .Marland Kitchen.. 1 By Mouth Two Times A Day As Needed Heartburn  Allergies (verified): No Known Drug Allergies  Past History:  Past Medical History: Last updated: 08/15/2007 Did not tolerate prev diuretic or beta-blocker use, Heel  Spurs    Past Surgical History: Last updated: 08/15/2007 Cholecystectomy - 06/07/1997, Tonsillectomy - 06/07/1977    Family History: Last updated: 03/22/2008 Diabetes 1st degree, hypertension   no FH of Rheumatoid  arthritis that she is aware of  Social History: Last updated: 03/04/2010 Patient single. Currently unemployed and looking for work.  2nd hand smoke exposure.  Risk Factors: Exercise: no (10/06/2009)  Risk Factors: Smoking Status: quit (06/10/2010) Packs/Day: <1/4 (06/10/2010)  Review of Systems  The patient denies anorexia, chest pain, dyspnea on exertion, peripheral edema, headaches, abdominal pain, hematochezia, and severe indigestion/heartburn.    Physical Exam  General:  alert, well-developed, well-nourished, and overweight-appearing.   Head:  normocephalic and atraumatic.   Neck:  supple.   Lungs:  normal respiratory effort.   Heart:  normal rate and regular rhythm.   Abdomen:  soft.   Msk:  decreased ROM.     Impression & Recommendations:  Problem # 1:  ADHESIVE CAPSULITIS OF SHOULDER (ICD-726.0)  Orders: [facility]- Est Level  3 (16109)  Problem # 2:  CARPAL TUNNEL SYNDROME (ICD-354.0)  Orders: [facility]- Est Level  3 (60454)  Complete Medication List: 1)  Atenolol 50 Mg Tabs (Atenolol) .Marland Kitchen.. 1 by mouth two times a day 2)  Ibuprofen 200 Mg Tabs (Ibuprofen) .... Take up to ever 4 hours as needed 3)  Hydrochlorothiazide 25  Mg Tabs (Hydrochlorothiazide) .... Take one (1) by mouth daily 4)  Ranitidine Hcl 150 Mg Caps (Ranitidine hcl) .Marland Kitchen.. 1 by mouth two times a day as needed heartburn  Patient Instructions: 1)  Please schedule a follow-up appointment in 2 months.    Prevention & Chronic Care Immunizations   Influenza vaccine: refused  (03/22/2008)   Influenza vaccine deferral: Refused  (06/10/2010)   Influenza vaccine due: 03/22/2009    Tetanus booster: 09/04/2007: Historical   Tetanus booster due: 09/03/2017    Pneumococcal vaccine: Not documented    Pneumococcal vaccine deferral: Not indicated  (10/06/2009)  Other Screening   Pap smear: NEGATIVE FOR INTRAEPITHELIAL LESIONS OR MALIGNANCY. BENIGN REACTIVE/REPARATIVE CHANGES.  (10/06/2009)   Pap smear due: 10/07/2010    Mammogram: ASSESSMENT: Negative - BI-RADS 1^MM DIGITAL SCREENING  (01/21/2009)   Mammogram action/deferral: Refused  (06/10/2010)   Mammogram due: 01/21/2010   Smoking status: quit  (06/10/2010)  Lipids   Total Cholesterol: 175  (10/06/2009)   LDL: 96  (10/06/2009)   LDL Direct: Not documented   HDL: 46  (10/06/2009)   Triglycerides: 167  (10/06/2009)  Hypertension   Last Blood Pressure: 132 / 85  (06/10/2010)   Serum creatinine: 0.69  (10/06/2009)   Serum potassium 3.8  (10/06/2009)    Hypertension flowsheet reviewed?: Yes   Progress toward BP goal: At goal  Self-Management Support :    Hypertension self-management support: Not documented   Orders Added: 1)  [facility]- Est Level  3 [04540]

## 2010-07-09 NOTE — Assessment & Plan Note (Signed)
Summary: rectal bleed/bmc   Vital Signs:  Patient profile:   50 year old female Height:      62.75 inches Weight:      224.8 pounds BMI:     40.28 Temp:     98.0 degrees F oral Pulse rate:   71 / minute BP sitting:   155 / 86  (left arm) Cuff size:   large  Vitals Entered By: Garen Grams LPN (June 18, 2010 3:00 PM) CC: rectal bleeding x 1 day Is Patient Diabetic? No Pain Assessment Patient in pain? no        Primary Care Provider:  Tinnie Gens MD  CC:  rectal bleeding x 1 day.  History of Present Illness: 1. Rectal bleeding:  Pt has had rectal bleeding with BMs for the past 12 hours.  She had a BM this morning and noticed some BRBPR.  She has been having to strain sometimes over the past week with BM.  She has IBS and goes from constipation to diarrhea.  She noticed some blood in the toilet but most of it was on the toilet paper.  She had a couple more BM later in the day and each one had a moderate amount of bright red blood.  Her BMs have not been painful.   PMHx: Hx of hemorrhoids and IBS  ROS: denies fevers, melena, hematochezia, abdominal pain  Habits & Providers  Alcohol-Tobacco-Diet     Tobacco Status: quit     Tobacco Counseling: to remain off tobacco products     Cigarette Packs/Day: <1/4     Year Quit: 7/09  Current Medications (verified): 1)  Atenolol 50 Mg  Tabs (Atenolol) .Marland Kitchen.. 1 By Mouth Two Times A Day 2)  Ibuprofen 200 Mg Tabs (Ibuprofen) .... Take Up To Ever 4 Hours As Needed 3)  Hydrochlorothiazide 25 Mg Tabs (Hydrochlorothiazide) .... Take One (1) By Mouth Daily 4)  Ranitidine Hcl 150 Mg Caps (Ranitidine Hcl) .Marland Kitchen.. 1 By Mouth Two Times A Day As Needed Heartburn 5)  Nupercainal 1 % Oint (Dibucaine) .... Apply Small Amount To Affected Area Twice A Day As Needed For Pain  Allergies: No Known Drug Allergies  Social History: Reviewed history from 03/04/2010 and no changes required. Denies tobacco or alcohol use  Physical Exam  General:  Vitals  reviewed.  Obese, no acute distress Lungs:  normal respiratory effort.   Heart:  normal rate and regular rhythm.   Abdomen:  soft, non-tender, normal bowel sounds, no distention, no masses, and no guarding.   Rectal:  no fissures or tears seen. no masses and + external hemorrhoid(s).     Impression & Recommendations:  Problem # 1:  RECTAL BLEEDING (ICD-569.3) Assessment New  This is most likely from hemorrhoids.  She has a hx of hemorrhoids and there are external hemorrhoids visible on exam.  This with painless, bright red blood is most likely hemorrhoids.  Will treat conservatively for now.  If not improved in a couple of days would consider referral to surgery for better evaluation.  Orders: [facility]- Est Level  3 (16109)  Complete Medication List: 1)  Atenolol 50 Mg Tabs (Atenolol) .Marland Kitchen.. 1 by mouth two times a day 2)  Ibuprofen 200 Mg Tabs (Ibuprofen) .... Take up to ever 4 hours as needed 3)  Hydrochlorothiazide 25 Mg Tabs (Hydrochlorothiazide) .... Take one (1) by mouth daily 4)  Ranitidine Hcl 150 Mg Caps (Ranitidine hcl) .Marland Kitchen.. 1 by mouth two times a day as needed heartburn 5)  Nupercainal  1 % Oint (Dibucaine) .... Apply small amount to affected area twice a day as needed for pain  Patient Instructions: 1)  I think that it is hemorrhoids. 2)  You definately had external hemorrhoids and you may have internal hemorrhoids which are difficult to see 3)  Use Preparation H for the next week along with the Nupercaine. 4)  If you continue to bleed by next week please return to clinic Prescriptions: NUPERCAINAL 1 % OINT (DIBUCAINE) Apply small amount to affected area twice a day as needed for pain  #1 x 1   Entered and Authorized by:   Angelena Sole MD   Signed by:   Angelena Sole MD on 06/18/2010   Method used:   Electronically to        [facility] [address] [phone]* (retail)       [address]       [place], [place]  [address]       Ph: [phone]       Fax:  [phone]   RxID:   [id number]    Orders Added: 1)  [facility]- Est Level  3 [60630]

## 2010-08-13 ENCOUNTER — Ambulatory Visit (INDEPENDENT_AMBULATORY_CARE_PROVIDER_SITE_OTHER): Payer: Self-pay | Admitting: Family Medicine

## 2010-08-13 ENCOUNTER — Encounter: Payer: Self-pay | Admitting: Family Medicine

## 2010-08-13 VITALS — BP 124/84 | HR 75 | Temp 98.0°F | Wt 224.0 lb

## 2010-08-13 DIAGNOSIS — K0889 Other specified disorders of teeth and supporting structures: Secondary | ICD-10-CM

## 2010-08-13 DIAGNOSIS — K089 Disorder of teeth and supporting structures, unspecified: Secondary | ICD-10-CM

## 2010-08-13 DIAGNOSIS — J069 Acute upper respiratory infection, unspecified: Secondary | ICD-10-CM

## 2010-08-13 MED ORDER — AMOXICILLIN 500 MG PO CAPS: 500.0000 mg | ORAL_CAPSULE | Freq: Three times a day (TID) | ORAL | Status: AC

## 2010-08-13 NOTE — Patient Instructions (Signed)
Start the antibiotics as prescribed Use nasal saline-as needed Restart your anti-histamines as well Return if you are not improved by next week Keep hydrated.  It was nice to meet you today.

## 2010-08-13 NOTE — Progress Notes (Signed)
  Subjective:    Patient ID: Carmen [name], female    DOB: [birth year], 50 y.o.   MRN: [id number]  HPI  2 weeks ago began having pain in the left side of mouth, felt like a toothache, seen by Dr. Colin Mulders (dentist) had a tooth capped, still has some pain with eating- feels an electric shock across mouth, now feels pressure in face. +sore throat, no post nasal drip, occ runny eyes (takes anti-histamines prn), occ cough- currently non smoker, feel achy in chest,  Has sinus infections typically twice a year  No antibiotics given by Dr. Colin Mulders   +sick contacts with boyfriend  Review of Systems +chills, +subjective fever, no chest pain, no diarrhea recently      Objective:   Physical Exam    Gen- NAD, alert and oriented, obese   HEENT- PERRL, EOMI, non injected conjunctiva, TTP ethmoid and frontal sinus, mild injection of oropharynx, erythema and tenderness to gumline- right bottom area, new cap on molar seen, no abscess, TTP gumline. Right TM clear, Left TM obscurred by soft wax Neck- supple, mild fullness- shotty LAD right side  CVS- RRR, no murmur  Pulses 2+  RESP- CTAB          Assessment & Plan:  URI- likely viral illness with sick contacts, however, pt has has more pain and pressure in region of sinus and recent dental work. Based on exam poor dentition and inflammation noted along gumlines- will treat with Amox, cover oral flora and sinusitis  Dental pain- see above, pt seen by dentist, cover with antibiotics no specific abscess seen

## 2010-08-17 ENCOUNTER — Telehealth: Payer: Self-pay | Admitting: Family Medicine

## 2010-08-17 ENCOUNTER — Encounter: Payer: Self-pay | Admitting: Family Medicine

## 2010-08-17 ENCOUNTER — Ambulatory Visit (INDEPENDENT_AMBULATORY_CARE_PROVIDER_SITE_OTHER): Payer: Self-pay | Admitting: Family Medicine

## 2010-08-17 DIAGNOSIS — J069 Acute upper respiratory infection, unspecified: Secondary | ICD-10-CM

## 2010-08-17 NOTE — Patient Instructions (Signed)
SUDAFED-sign for, 2 tabs tid prn for congestion Recommend using the nasal steroid for 2 weeks, or longer

## 2010-08-17 NOTE — Assessment & Plan Note (Signed)
Explained that changing antibiotics would not help, as this is likely viral.  Prescribed nasal steroid and OTC decongestant.

## 2010-08-17 NOTE — Progress Notes (Signed)
  Subjective:    Patient ID: Carmen [name], female    DOB: 1960-06-19, 50 y.o.   MRN: [id number]  HPI Here again for sinus pain, recent lower molar capping and currently on Amox for 3 days per Dr. Jeanice Lim.  Asking to be switched to a Zpack she does not feel that the Amoxicillin is working.  Exposed to smoke in house, boyfriend also sick and was placed on a nasal steroid by his doctor.  She does not know what to do, about her teeth, she needs more work but cannot afford the bills.   Review of Systems  Constitutional: Negative for fever and chills.  HENT: Positive for congestion, facial swelling and postnasal drip. Negative for rhinorrhea, neck stiffness and ear discharge.   Respiratory: Negative for cough.        Objective:   Physical Exam  Constitutional:       Alert, obese, in no acute distress  HENT:  Right Ear: External ear normal.  Left Ear: External ear normal.  Neck: Normal range of motion.  Cardiovascular: Normal rate and regular rhythm.   Pulmonary/Chest: Effort normal and breath sounds normal.  Lymphadenopathy:    She has no cervical adenopathy.  Skin: Skin is warm and dry.          Assessment & Plan:

## 2010-08-17 NOTE — Telephone Encounter (Signed)
Pt did not get Flonase from the pharmacy.  Please call her when ready.

## 2010-08-18 MED ORDER — FLUTICASONE PROPIONATE 50 MCG/ACT NA SUSP: 2.0000 | Freq: Every day | NASAL | Status: DC

## 2010-08-18 NOTE — Telephone Encounter (Signed)
It was reordered, but it was ordered yesterday.  If it still is not there will need to be called in but both were sent elctronically.

## 2010-09-01 ENCOUNTER — Encounter: Payer: Self-pay | Admitting: Family Medicine

## 2010-09-01 ENCOUNTER — Ambulatory Visit (INDEPENDENT_AMBULATORY_CARE_PROVIDER_SITE_OTHER): Payer: Self-pay | Admitting: Family Medicine

## 2010-09-01 VITALS — BP 132/77 | HR 69 | Temp 97.9°F | Wt 226.4 lb

## 2010-09-01 DIAGNOSIS — I10 Essential (primary) hypertension: Secondary | ICD-10-CM

## 2010-09-01 DIAGNOSIS — J309 Allergic rhinitis, unspecified: Secondary | ICD-10-CM

## 2010-09-01 NOTE — Progress Notes (Signed)
  Subjective:    Patient ID: Carmen [name], female    DOB: Jan 28, 1961, 50 y.o.   MRN: [id number]  Hypertension This is a chronic problem. The current episode started more than 1 year ago. The problem has been gradually improving since onset. The problem is controlled. Pertinent negatives include no anxiety, chest pain, headaches, malaise/fatigue, neck pain, orthopnea, peripheral edema or shortness of breath. There are no associated agents to hypertension. Risk factors for coronary artery disease include sedentary lifestyle and smoking/tobacco exposure. Past treatments include beta blockers and diuretics. The current treatment provides moderate improvement. There are no compliance problems.       Review of Systems  Constitutional: Negative for fever, malaise/fatigue, appetite change and fatigue.  HENT: Negative for nosebleeds, congestion, facial swelling, rhinorrhea, sneezing, neck pain and postnasal drip.   Respiratory: Negative for shortness of breath.   Cardiovascular: Negative for chest pain and orthopnea.  Genitourinary: Negative for urgency and pelvic pain.  Musculoskeletal: Negative for back pain.  Neurological: Negative for headaches.       Objective:   Physical Exam  HENT:  Head: Normocephalic and atraumatic.  Mouth/Throat: Oropharynx is clear and moist. No posterior oropharyngeal edema or posterior oropharyngeal erythema.  Neck: Normal range of motion.  Cardiovascular: Normal rate.   Pulmonary/Chest: Effort normal and breath sounds normal.  Abdominal: Soft. There is no tenderness.          Assessment & Plan:

## 2010-09-01 NOTE — Assessment & Plan Note (Signed)
Yearly flare of this--continue anti-histamine and flonase

## 2010-09-01 NOTE — Assessment & Plan Note (Signed)
Control looks good

## 2010-09-24 ENCOUNTER — Telehealth: Payer: Self-pay | Admitting: *Deleted

## 2010-09-24 DIAGNOSIS — I10 Essential (primary) hypertension: Secondary | ICD-10-CM

## 2010-09-24 MED ORDER — ATENOLOL 50 MG PO TABS: 50.0000 mg | ORAL_TABLET | Freq: Two times a day (BID) | ORAL | Status: DC

## 2010-09-24 NOTE — Telephone Encounter (Signed)
Refill request received from pharmacy for Atenolol 50 mg one tablet  twice daily. RX sent electronically to pharmacy.

## 2010-09-25 ENCOUNTER — Encounter: Payer: Self-pay | Admitting: Family Medicine

## 2010-09-25 ENCOUNTER — Ambulatory Visit (INDEPENDENT_AMBULATORY_CARE_PROVIDER_SITE_OTHER): Payer: Self-pay | Admitting: Family Medicine

## 2010-09-25 DIAGNOSIS — R197 Diarrhea, unspecified: Secondary | ICD-10-CM

## 2010-09-25 DIAGNOSIS — K625 Hemorrhage of anus and rectum: Secondary | ICD-10-CM

## 2010-09-25 NOTE — Patient Instructions (Addendum)
Ok to take Immodium Make follow-up with your doctor for early next week for a recheck  Diarrhea Diarrhea is watery poop (stool). The most common cause of diarrhea is a virus. Other causes include:  Food poisoning.   Germ (bacterial) infection.   Reaction to medicine.  HOME CARE  Drink clear fluids. This can stop you from losing too much fluid (dehydration).   Drink enough water and fluids to keep the pee clear or pale yellow.   Avoid solid foods and dairy products until you or your child feels better. Then start eating bland foods, such as:   Bananas.   Rice.    Crackers.     Applesauce.   Dry toast.       Avoid spicy foods, caffeine, and alcohol.   Your doctor may give medicine to help with cramps and watery poop. Take this only as you are told. Avoid these medicines if you or your child has a fever or blood in the poop.   Your doctor may give medicines (antibiotics) that kill germs. These are given if the watery poop is caused by germs. Take these only as you are told.  GET HELP IF:  The watery poop lasts longer than 3 days.   You or your child has a temperature by mouth above 101  Your baby is older than 3 months with a rectal temperature of 100.5 F (38.1 C) or higher for more than 1 day.   There is blood in the poop.   You or your child starts throwing up (vomiting).   You or your child loses too much fluid.  MAKE SURE YOU:  Understand these instructions.   Will watch this condition.   Will get help right away if you or your child is not doing well or gets worse.  Document Released: 11/10/2007 Document Re-Released: 08/18/2009 [facility] Patient Information 2011 [place], [place].

## 2010-09-28 DIAGNOSIS — R197 Diarrhea, unspecified: Secondary | ICD-10-CM | POA: Insufficient documentation

## 2010-09-28 NOTE — Progress Notes (Signed)
  Subjective:    Patient ID: Carmen [name], female    DOB: [birth year], 50 y.o.   MRN: [id number]  HPI 4 day history of diarrhea.  , non-watery.  Several times,  Some abdominal soreness.  No emesis, nausea, fever, rash, myalgais, or URI symptoms.  No sick contacts.  Has history of IBS, in recent times, has been more on constipation.      Review of Systems See HPI    Objective:   Physical Exam  Constitutional: She appears well-developed and well-nourished.  Cardiovascular: Normal rate and regular rhythm.   No murmur heard. Pulmonary/Chest: Effort normal and breath sounds normal. No respiratory distress. She has no wheezes. She has no rales.  Abdominal: Bowel sounds are normal. She exhibits no distension and no mass. There is no tenderness. There is no rebound and no guarding.  Genitourinary:       Normal rectal tone.  No hemorrhoids externally, no anal fissure          Assessment & Plan:

## 2010-09-28 NOTE — Assessment & Plan Note (Signed)
Now on 4 days, without other symptoms suggestive of infectious etiology.  I suspect this may be more related to IBS.  Asked patient to monitor for resolution over the next few days if infectious.  Follow-up with PCP if continues

## 2010-09-29 ENCOUNTER — Encounter: Payer: Self-pay | Admitting: Family Medicine

## 2010-09-29 ENCOUNTER — Ambulatory Visit (INDEPENDENT_AMBULATORY_CARE_PROVIDER_SITE_OTHER): Payer: Self-pay | Admitting: Family Medicine

## 2010-09-29 DIAGNOSIS — K625 Hemorrhage of anus and rectum: Secondary | ICD-10-CM

## 2010-09-29 DIAGNOSIS — L910 Hypertrophic scar: Secondary | ICD-10-CM

## 2010-09-29 DIAGNOSIS — K589 Irritable bowel syndrome without diarrhea: Secondary | ICD-10-CM

## 2010-09-29 NOTE — Progress Notes (Signed)
  Subjective:    Patient ID: Carmen [name], female    DOB: 12/30/1960, 50 y.o.   MRN: [id number]  HPI 1. Stomach pain:  Pt has had diarrhea since last week that has since changed to diarrhea.  Was seen in clinic for the diarrhea and given supportive care.  For the past 2 days she has had more formed, hard stools and she now feels like she is constipated.  She has a hx of IBS and she thinks that it is probably from that.  Her stomach pain is described as a diffuse, mild, crampy pain.  Non-focal.  Still tolerating liquids and food well.  She has been taking some Colace and started eating some Activia which has helped a little bit.  2. Boil:  She thought that she might have a boil on her back.  She noticed it a couple days ago and thinks that she might have another boil.  She has a hx of a boil on her back and she thinks that it might be back.   Review of Systems Denies fevers, chills, chest pain, n/v/d.  Endorses rectal bleeding has improved from hemorrhoids.    Objective:   Physical Exam  Constitutional: She appears well-developed and well-nourished.  Cardiovascular: Normal rate and regular rhythm.   No murmur heard. Pulmonary/Chest: Effort normal and breath sounds normal. No respiratory distress. She has no wheezes. She has no rales.  Abdominal: Bowel sounds are normal. She exhibits no distension and no mass. There is no tenderness. There is no rebound and no guarding.  Genitourinary:       Normal rectal tone.  No hemorrhoids externally, no anal fissure      (rectal exam from last note) Back: small keloid from prior I&D site, no abscess or cellulitis        Assessment & Plan:

## 2010-09-29 NOTE — Assessment & Plan Note (Signed)
Improved.  Work on keeping bowel movements soft.

## 2010-09-29 NOTE — Patient Instructions (Signed)
I am glad that you are feeling better For your constipation it is important to keep your bowels moving Start taking Colace twice a day You can also take a fiber supplement like Citrucel Using Probiotics, like Activia may help It is important for you to drink lots of water.  Try and drink 8 glasses a day.

## 2010-09-29 NOTE — Assessment & Plan Note (Signed)
Now with constipation.  Advised normal supportive care for constipation.  Precautions given.

## 2010-09-29 NOTE — Assessment & Plan Note (Signed)
No signs of infection

## 2010-10-27 ENCOUNTER — Encounter: Payer: Self-pay | Admitting: Family Medicine

## 2010-11-09 ENCOUNTER — Encounter: Payer: Self-pay | Admitting: Family Medicine

## 2010-11-17 ENCOUNTER — Telehealth: Payer: Self-pay | Admitting: *Deleted

## 2010-11-17 DIAGNOSIS — I10 Essential (primary) hypertension: Secondary | ICD-10-CM

## 2010-11-17 NOTE — Telephone Encounter (Signed)
Refill request received from [facility] , [place]  [address]. [place] , [place] for Atenolol 50 mg . Will forward to Dr. Shawnie Pons.

## 2010-11-18 MED ORDER — ATENOLOL 50 MG PO TABS: 50.0000 mg | ORAL_TABLET | Freq: Two times a day (BID) | ORAL | Status: DC

## 2010-11-25 ENCOUNTER — Other Ambulatory Visit: Payer: Self-pay | Admitting: Family Medicine

## 2010-12-23 ENCOUNTER — Ambulatory Visit (INDEPENDENT_AMBULATORY_CARE_PROVIDER_SITE_OTHER): Payer: Self-pay | Admitting: Family Medicine

## 2010-12-23 ENCOUNTER — Other Ambulatory Visit (HOSPITAL_COMMUNITY)
Admission: RE | Admit: 2010-12-23 | Discharge: 2010-12-23 | Disposition: A | Discharge status: Home / Self Care | Payer: Self-pay | Source: Ambulatory Visit | Attending: Family Medicine | Admitting: Family Medicine

## 2010-12-23 ENCOUNTER — Encounter: Payer: Self-pay | Admitting: Family Medicine

## 2010-12-23 VITALS — BP 143/89 | HR 67 | Temp 98.3°F | Ht 62.0 in | Wt 223.1 lb

## 2010-12-23 DIAGNOSIS — I10 Essential (primary) hypertension: Secondary | ICD-10-CM

## 2010-12-23 DIAGNOSIS — Z01419 Encounter for gynecological examination (general) (routine) without abnormal findings: Secondary | ICD-10-CM | POA: Insufficient documentation

## 2010-12-23 DIAGNOSIS — Z124 Encounter for screening for malignant neoplasm of cervix: Secondary | ICD-10-CM

## 2010-12-23 LAB — CBC
MCH: 30.4 pg (ref 26.0–34.0)
MCHC: 33.7 g/dL (ref 30.0–36.0)
Platelets: 243 10*3/uL (ref 150–400)
RDW: 13.2 % (ref 11.5–15.5)

## 2010-12-23 MED ORDER — HYDROCHLOROTHIAZIDE 25 MG PO TABS: 25.0000 mg | ORAL_TABLET | Freq: Every day | ORAL | Status: DC

## 2010-12-23 MED ORDER — ATENOLOL 50 MG PO TABS: 50.0000 mg | ORAL_TABLET | Freq: Two times a day (BID) | ORAL | Status: DC

## 2010-12-23 NOTE — Progress Notes (Signed)
  Subjective:     Carmen [name] is a 50 y.o. female and is here for a comprehensive physical exam. The patient reports no problems.  History   Social History  . Marital Status: Divorced    Spouse Name: N/A    Number of Children: N/A  . Years of Education: N/A   Occupational History  . Not on file.   Social History Main Topics  . Smoking status: Former Smoker    Quit date: 08/13/2007  . Smokeless tobacco: Never Used   Comment: boyfriend smokes  . Alcohol Use: No  . Drug Use: No  . Sexually Active: Not on file   Other Topics Concern  . Not on file   Social History Narrative  . No narrative on file   Health Maintenance  Topic Date Due  . Pap Smear  04/15/1980  . Tetanus/tdap  09/03/2017    The following portions of the patient's history were reviewed and updated as appropriate: allergies, current medications, past family history, past medical history, past social history, past surgical history and problem list.  Review of Systems Constitutional: negative Respiratory: negative Cardiovascular: negative Gastrointestinal: negative except for constipation and reflux symptoms Genitourinary:negative except for irregular cycles at times Integument/breast: negative Hematologic/lymphatic: negative Musculoskeletal:negative except for arthralgias, stiff joints and frozen shoulder Neurological: negative Behavioral/Psych: negative   Objective:    BP 143/89  Pulse 67  Temp(Src) 98.3 F (36.8 C) (Oral)  Ht 5\' 2"  (1.575 m)  Wt 223 lb 1.6 oz (101.197 kg)  BMI 40.81 kg/m2 General appearance: alert and cooperative Head: Normocephalic, without obvious abnormality, atraumatic Neck: no adenopathy, supple, symmetrical, trachea midline and thyroid not enlarged, symmetric, no tenderness/mass/nodules Lungs: clear to auscultation bilaterally Breasts: normal appearance, no masses or tenderness Heart: regular rate and rhythm, S1, S2 normal, no murmur, click, rub or  gallop Abdomen: soft, non-tender; bowel sounds normal; no masses,  no organomegaly Pelvic: cervix normal in appearance, external genitalia normal, no adnexal masses or tenderness, no cervical motion tenderness, uterus normal size, shape, and consistency and vagina normal without discharge Extremities: extremities normal, atraumatic, no cyanosis or edema Pulses: 2+ and symmetric Skin: Skin color, texture, turgor normal. No rashes or lesions Lymph nodes: Cervical, supraclavicular, and axillary nodes normal.     Assessment:    Healthy female exam. Doing well     Plan:    Mammogram Pap smear Blood work Med refill. See After Visit Summary for Counseling Recommendations

## 2010-12-23 NOTE — Patient Instructions (Signed)

## 2010-12-24 ENCOUNTER — Encounter: Payer: Self-pay | Admitting: Family Medicine

## 2010-12-24 LAB — COMPREHENSIVE METABOLIC PANEL
AST: 18 U/L (ref 0–37)
Albumin: 4.1 g/dL (ref 3.5–5.2)
Alkaline Phosphatase: 63 U/L (ref 39–117)
Potassium: 3.3 mEq/L — ABNORMAL LOW (ref 3.5–5.3)
Sodium: 140 mEq/L (ref 135–145)
Total Bilirubin: 0.6 mg/dL (ref 0.3–1.2)
Total Protein: 7.2 g/dL (ref 6.0–8.3)

## 2010-12-24 LAB — LIPID PANEL
HDL: 48 mg/dL (ref >39)
LDL Cholesterol: 109 mg/dL — ABNORMAL HIGH (ref 0–99)
Total CHOL/HDL Ratio: 3.9 Ratio
Triglycerides: 160 mg/dL — ABNORMAL HIGH (ref <150)
VLDL: 32 mg/dL (ref 0–40)

## 2010-12-25 ENCOUNTER — Telehealth: Payer: Self-pay | Admitting: Family Medicine

## 2010-12-25 ENCOUNTER — Ambulatory Visit (INDEPENDENT_AMBULATORY_CARE_PROVIDER_SITE_OTHER): Payer: Self-pay | Admitting: Family Medicine

## 2010-12-25 ENCOUNTER — Encounter: Payer: Self-pay | Admitting: Family Medicine

## 2010-12-25 DIAGNOSIS — R5383 Other fatigue: Secondary | ICD-10-CM | POA: Insufficient documentation

## 2010-12-25 DIAGNOSIS — R5381 Other malaise: Secondary | ICD-10-CM

## 2010-12-25 NOTE — Progress Notes (Signed)
  Subjective:    Patient ID: Carmen [name], female    DOB: 04/11/1961, 50 y.o.   MRN: [id number]  HPI 1. Feeling unwell starting today.  Feels drained, seeing floaters, told she was "red in the face". Was at work. Asked to leave work due to feeling unwell. Asking to be excused from work through Sunday (07/22).  Did lay out by the pool in the sun several hours yesterday.  2. Arthritis Persistent. Not worse than usual. Aches all joints, worse in right elbow.  Review of Systems Denies fevers, chills, nausea/vomiting/diarrhea, blood in stool or bleeding from rectum, difficulty breathing, chest pain, rash.    Objective:   Physical Exam General: NAD, obese CV: RRR Pulm: no increased WOB, CTAB with no w/r/r Abd: soft, NT, ND Ext: warm, dry, 2+ pedal pulses Psych: flat affect Skin: mild erythema throughout    Assessment & Plan:

## 2010-12-25 NOTE — Telephone Encounter (Signed)
Dr. Jennette Kettle, Is this the right patient that you are talking about?

## 2010-12-25 NOTE — Assessment & Plan Note (Addendum)
New. I think patient may have a mild viral illness, may be tired from being outside yesterday, or may just want a work excuse. I did not see anything particularly concerning in her history or physical exam. Will give work-note through tomorrow (07/21). Asked patient to return to work on 07/22. Tylenol prn for fever or pain.

## 2010-12-25 NOTE — Telephone Encounter (Signed)
Dear [facility] I have put on an order for [facility] to see a speech tehrapist--he has a little speech delay and some stuttering--please set ths up and call Seleta Rhymes

## 2010-12-25 NOTE — Patient Instructions (Addendum)
I think you may have a viral infection. Drink plenty of fluids and get plenty of rest. You may taken Tylenol if you have any fevers or pain.

## 2010-12-30 NOTE — Telephone Encounter (Signed)
No sorry I already spoke w you about thuis Wrong pt

## 2011-01-11 ENCOUNTER — Ambulatory Visit (INDEPENDENT_AMBULATORY_CARE_PROVIDER_SITE_OTHER): Payer: Self-pay | Admitting: Family Medicine

## 2011-01-11 ENCOUNTER — Encounter: Payer: Self-pay | Admitting: Family Medicine

## 2011-01-11 DIAGNOSIS — R5381 Other malaise: Secondary | ICD-10-CM

## 2011-01-11 DIAGNOSIS — I10 Essential (primary) hypertension: Secondary | ICD-10-CM

## 2011-01-11 DIAGNOSIS — F410 Panic disorder [episodic paroxysmal anxiety] without agoraphobia: Secondary | ICD-10-CM

## 2011-01-11 DIAGNOSIS — R5383 Other fatigue: Secondary | ICD-10-CM

## 2011-01-11 LAB — CBC
HCT: 41.7 % (ref 36.0–46.0)
Hemoglobin: 14.2 g/dL (ref 12.0–15.0)
MCH: 30.5 pg (ref 26.0–34.0)
MCHC: 34.1 g/dL (ref 30.0–36.0)

## 2011-01-11 MED ORDER — ATENOLOL 50 MG PO TABS: 50.0000 mg | ORAL_TABLET | Freq: Two times a day (BID) | ORAL | Status: DC

## 2011-01-11 MED ORDER — CLONAZEPAM 0.5 MG PO TABS: 0.5000 mg | ORAL_TABLET | Freq: Two times a day (BID) | ORAL | Status: DC | PRN

## 2011-01-11 NOTE — Progress Notes (Signed)
  Subjective:    Patient ID: Carmen [name], female    DOB: 1961/02/14, 50 y.o.   MRN: [id number]  HPI Comments: Feels jittery and anxious and possibly dizzy.  She was seen here for this same problem and thought to have a viral illness.  She got light-headed and wondered if she had a panic attack.  Feels stopped up now.  Extremity Weakness  This is a new problem. The current episode started 1 to 4 weeks ago. There has been no history of extremity trauma. The problem has been gradually improving. The patient is experiencing no pain. Pertinent negatives include no fever, numbness, stiffness or tingling.      Review of Systems  Constitutional: Negative for fever.  Respiratory: Negative for chest tightness and shortness of breath.   Gastrointestinal: Negative for abdominal pain and abdominal distention.  Genitourinary: Negative for dysuria.  Musculoskeletal: Positive for extremity weakness. Negative for arthralgias and stiffness.  Neurological: Negative for tingling and numbness.       Objective:   Physical Exam  Constitutional: She is oriented to person, place, and time. She appears well-developed and well-nourished.  HENT:  Head: Normocephalic and atraumatic.  Neck: Normal range of motion.  Cardiovascular: Normal rate.   Pulmonary/Chest: Effort normal.  Abdominal: Soft.  Neurological: She is alert and oriented to person, place, and time.  Skin: Skin is warm and dry.          Assessment & Plan:  Fatigue and achiness, likely viral syndrome.  May be some element of panic attacks.

## 2011-01-11 NOTE — Patient Instructions (Addendum)
Hypertension (High Blood Pressure) As your heart beats, it forces blood through your arteries. This force is your blood pressure. If the pressure is too high, it is called hypertension (HTN) or high blood pressure. HTN is dangerous because you may have it and not know it. High blood pressure may mean that your heart has to work harder to pump blood. Your arteries may be narrow or stiff. The extra work puts you at risk for heart disease, stroke, and other problems.  Blood pressure consists of two numbers, a higher number over a lower, 110/72, for example. It is stated as "110 over 72." The ideal is below 120 for the top number (systolic) and under 80 for the bottom (diastolic). Write down your blood pressure today. You should pay close attention to your blood pressure if you have certain conditions such as:  Heart failure.  Prior heart attack.   Diabetes   Chronic kidney disease.   Prior stroke.   Multiple risk factors for heart disease.   To see if you have HTN, your blood pressure should be measured while you are seated with your arm held at the level of the heart. It should be measured at least twice. A one-time elevated blood pressure reading (especially in the Emergency Department) does not mean that you need treatment. There may be conditions in which the blood pressure is different between your right and left arms. It is important to see your caregiver soon for a recheck. Most people have essential hypertension which means that there is not a specific cause. This type of high blood pressure may be lowered by changing lifestyle factors such as:  Stress.  Smoking.   Lack of exercise.   Excessive weight.  Drug/tobacco/alcohol use.   Eating less salt.   Most people do not have symptoms from high blood pressure until it has caused damage to the body. Effective treatment can often prevent, delay or reduce that damage. TREATMENT Treatment for high blood pressure, when a cause has been  identified, is directed at the cause. There are a large number of medications to treat HTN. These fall into several categories, and your caregiver will help you select the medicines that are best for you. Medications may have side effects. You should review side effects with your caregiver. If your blood pressure stays high after you have made lifestyle changes or started on medicines,   Your medication(s) may need to be changed.   Other problems may need to be addressed.   Be certain you understand your prescriptions, and know how and when to take your medicine.   Be sure to follow up with your caregiver within the time frame advised (usually within two weeks) to have your blood pressure rechecked and to review your medications.   If you are taking more than one medicine to lower your blood pressure, make sure you know how and at what times they should be taken. Taking two medicines at the same time can result in blood pressure that is too low.  SEEK IMMEDIATE MEDICAL CARE IF YOU DEVELOP:  A severe headache, blurred or changing vision, or confusion.   Unusual weakness or numbness, or a faint feeling.   Severe chest or abdominal pain, vomiting, or breathing problems.  MAKE SURE YOU:   Understand these instructions.   Will watch your condition.   Will get help right away if you are not doing well or get worse.  Document Released: 05/24/2005 Document Re-Released: 11/11/2009 [employer] Patient Information 2011 [employer].Anxiety and Panic Attacks Your caregiver has informed you that you are having an anxiety or panic attack. There may be many forms of this. Most of the time these attacks come suddenly and without warning. They come at any time of day, including periods of sleep, and at any time of life. They may be strong and unexplained. Although panic attacks are very scary, they are physically harmless. Sometimes the cause of your anxiety is not known. Anxiety is a protective mechanism  of the body in its fight or flight mechanism. Most of these perceived danger situations are actually nonphysical situations (such as anxiety over losing a job). CAUSES The causes of an anxiety or panic attack are many. Panic attacks may occur in otherwise healthy people given a certain set of circumstances. There may be a genetic cause for panic attacks. Some medications may also have anxiety as a side effect. SYMPTOMS Some of the most common feelings are:  Intense terror.  Dizziness, feeling faint.   Hot and cold flashes.   Fear of going crazy.   Feelings that nothing is real.   Sweating.   Shaking.   Chest pain or a fast heartbeat (palpitations).  Smothering, choking sensations.   Feelings of impending doom and that death is near.   Tingling of extremities, this may be from over breathing.   Altered reality (derealization).   Being detached from yourself (depersonalization).   Several symptoms can be present to make up anxiety or panic attacks. DIAGNOSIS The evaluation by your caregiver will depend on the type of symptoms you are experiencing. The diagnosis of anxiety or pain attack is made when no physical illness can be determined to be a cause of the symptoms. TREATMENT Treatment to prevent anxiety and panic attacks may include:  Avoidance of circumstances that cause anxiety.   Reassurance and relaxation.   Regular exercise.   Relaxation therapies, such as yoga.   Psychotherapy with a psychiatrist or therapist.   Avoidance of caffeine, alcohol and illegal drugs.   Prescribed medication.  SEEK IMMEDIATE MEDICAL CARE IF:  You experience panic attack symptoms that are different than your usual symptoms.   You have any worsening or concerning symptoms.  Document Released: 05/24/2005 Document Re-Released: 11/11/2009 [facility] Patient Information 2011 [place], [place].Fatigue Fatigue is a feeling of tiredness, lack of energy, lack of motivation, or feeling tired  all the time. Having enough rest, good nutrition, and reducing stress will normally reduce fatigue. Consult your caregiver if it persists. The nature of your fatigue will help your caregiver to find out its cause. The treatment is based on the cause.  CAUSES There are many causes for fatigue. Most of the time, fatigue can be traced to one or more of your habits or routines. Most causes fit into one or more of three general areas. They are: Lifestyle problems  Sleep disturbances.  Overwork.   Physical exertion.   Unhealthy habits  Poor eating habits or eating disorders   Alcohol and/or drug use   Lack of proper nutrition (malnutrition).   Psychological problems  Stress and/or anxiety problems.  Depression.   Grief.  Boredom.   Medical Problems or Conditions  Anemia.  Pregnancy.   Thyroid gland problems.   Recovery from major surgery.   Continuous pain.   Emphysema or asthma that is not well controlled   Allergic conditions.   Diabetes.   Infections (such as mononucleosis).   Obesity.   Sleep disorders, such as sleep apnea.  Heart failure or other  heart-related problems.   Cancer.   Kidney disease.   Liver disease.   Effects of certain medicines such as antihistamines, cough and cold remedies, prescription pain medicines, heart and blood pressure medicines, drugs used for treatment of cancer, and some antidepressants.   SYMPTOMS The symptoms of fatigue include:   Lack of energy.  Lack of drive (motivation).   Drowsiness.  Feeling of indifference to the surroundings.   DIAGNOSIS The details of how you feel help guide your caregiver in finding out what is causing the fatigue. You will be asked about your present and past health condition. It is important to review all medicines that you take, including prescription and non-prescription items. A thorough exam will be done. You will be questioned about your feelings, habits, and normal lifestyle. Your  caregiver may suggest blood tests, urine tests, or other tests to look for common medical causes of fatigue.  TREATMENT Fatigue is treated by correcting the underlying cause. For example, if you have continuous pain or depression, treating these causes will improve how you feel. Similarly, adjusting the dose of certain medicines will help in reducing fatigue.  HOME CARE INSTRUCTIONS  Try to get the required amount of good sleep every night.   Eat a healthy and nutritious diet, and drink enough water throughout the day.   Practice ways of relaxing (including yoga or meditation).   Exercise regularly.   Make plans to change situations that cause stress. Act on those plans so that stresses decrease over time. Keep your work and personal routine reasonable.   Avoid street drugs and minimize use of alcohol.   Start taking a daily multivitamin after consulting your caregiver.  SEEK MEDICAL CARE IF:  You have persistent tiredness, which cannot be accounted for.   You have fever.   You have unintentional weight loss.   You have headaches.   You have disturbed sleep throughout the night.   You are feeling sad.   You have constipation.   You have dry skin.   You have gained weight.   You are taking any new or different medicines that you suspect are causing fatigue.   You are unable to sleep at night.   You develop any unusual swelling of your legs or other parts of your body.  SEEK IMMEDIATE MEDICAL CARE IF:  You are feeling confused.   Your vision is blurred.   You feel faint or pass out.   You develop severe headache.   You develop severe abdominal, pelvic, or back pain.   You develop chest pain, shortness of breath, or an irregular or fast heartbeat.   You are unable to pass a normal amount of urine.   You develop abnormal bleeding such as bleeding from the rectum or you vomit blood.   You have thoughts about harming yourself or committing suicide.   You are  worried that you might harm someone else.  MAKE SURE YOU:   Understand these instructions.   Will watch your condition.   Will get help right away if you are not doing well or get worse.  REFERENCES   [employer]  [email or web address]  [employer]  medicalance.com Document Released: 03/21/2007 Document Re-Released: 05/06/2008 [facility] Patient Information 2011 [place], [place].

## 2011-02-16 ENCOUNTER — Encounter: Payer: Self-pay | Admitting: Family Medicine

## 2011-02-16 ENCOUNTER — Ambulatory Visit (INDEPENDENT_AMBULATORY_CARE_PROVIDER_SITE_OTHER): Payer: Self-pay | Admitting: Family Medicine

## 2011-02-16 VITALS — BP 135/80 | HR 63 | Temp 98.6°F | Ht 62.0 in | Wt 223.0 lb

## 2011-02-16 DIAGNOSIS — J40 Bronchitis, not specified as acute or chronic: Secondary | ICD-10-CM

## 2011-02-16 MED ORDER — AZITHROMYCIN 500 MG PO TABS: 500.0000 mg | ORAL_TABLET | Freq: Every day | ORAL | Status: AC

## 2011-02-16 NOTE — Patient Instructions (Signed)
Take the antibiotic and getting U. one time daily for the next 3 days. Come back and see Dr. Shawnie Pons in 2 weeks.

## 2011-02-16 NOTE — Progress Notes (Signed)
  Subjective:    Patient ID: Carmen [name], female    DOB: 1960/08/16, 50 y.o.   MRN: [id number]  HPI    Review of Systems     Objective:   Physical Exam        Assessment & Plan:

## 2011-02-16 NOTE — Progress Notes (Signed)
  Subjective:    Patient ID: Carmen [name], female    DOB: 1961/04/05, 50 y.o.   MRN: [id number]  HPI Patient is here with cough for the last month. Patient states that this occurs approximately 2 times a year during the changes of the season. Patient states that her cough has been nonproductive but does wake her up at night. Feels like she is wheezing and using her albuterol inhaler approximately every 6 hours. Patient denies much shortness of breath no dyspnea on exertion no chest pain. Patient states that she has been treated with antibiotics for the same problem in the past and has had good results. Patient at this point denies any type of fevers, chills, abdominal pain, shortness of breath, dyspnea on exertion, diarrhea, constipation   Review of Systems As stated in the history of present illness Past medical history, social, surgical and family history all reviewed.     Objective:   Physical Exam Constitutional: She is oriented to person, place, and time. She appears well-developed and well-nourished.  HENT: Pupils equal round reactive to light and accommodation, patient does have a positive postnasal drip Head: Normocephalic and atraumatic.  Neck: Normal range of motion.  no lymphadenopathy Cardiovascular: Normal rate.   regular rhythm no murmur appreciated Pulmonary/Chest: Effort normal.  does have mild expiratory wheezes throughout Abdominal: Soft.  Neurological: She is alert and oriented to person, place, and time.  Skin: Skin is warm and dry. no rash    Assessment & Plan:  Bronchitis Patient has had a cough now for approximately one week. On physical exam no signs of pneumonia no fevers no chills he concern at the duration of this symptoms. Patient will be given azithromycin for the next 3 days and then followup with primary care provider in 2 weeks. Patient will continue on her Flonase continue with the over-the-counter antihistamines the patient is taking to try to stop the  postnasal drip.

## 2011-02-16 NOTE — Assessment & Plan Note (Signed)
Patient has had a cough now for approximately one week. On physical exam no signs of pneumonia no fevers no chills he concern at the duration of this symptoms. Patient will be given azithromycin for the next 3 days and then followup with primary care provider in 2 weeks. Patient will continue on her Flonase continue with the over-the-counter antihistamines the patient is taking to try to stop the postnasal drip.

## 2011-02-22 ENCOUNTER — Other Ambulatory Visit: Payer: Self-pay | Admitting: Family Medicine

## 2011-02-22 DIAGNOSIS — I10 Essential (primary) hypertension: Secondary | ICD-10-CM

## 2011-02-22 MED ORDER — ATENOLOL 50 MG PO TABS: 50.0000 mg | ORAL_TABLET | Freq: Two times a day (BID) | ORAL | Status: DC

## 2011-02-22 MED ORDER — HYDROCHLOROTHIAZIDE 25 MG PO TABS: 25.0000 mg | ORAL_TABLET | Freq: Every day | ORAL | Status: DC

## 2011-03-15 ENCOUNTER — Ambulatory Visit (INDEPENDENT_AMBULATORY_CARE_PROVIDER_SITE_OTHER): Payer: Self-pay | Admitting: Family Medicine

## 2011-03-15 ENCOUNTER — Encounter: Payer: Self-pay | Admitting: Family Medicine

## 2011-03-15 DIAGNOSIS — J309 Allergic rhinitis, unspecified: Secondary | ICD-10-CM

## 2011-03-15 DIAGNOSIS — M7500 Adhesive capsulitis of unspecified shoulder: Secondary | ICD-10-CM

## 2011-03-15 DIAGNOSIS — L0390 Cellulitis, unspecified: Secondary | ICD-10-CM

## 2011-03-15 DIAGNOSIS — I10 Essential (primary) hypertension: Secondary | ICD-10-CM

## 2011-03-15 DIAGNOSIS — F419 Anxiety disorder, unspecified: Secondary | ICD-10-CM

## 2011-03-15 DIAGNOSIS — F411 Generalized anxiety disorder: Secondary | ICD-10-CM

## 2011-03-15 DIAGNOSIS — L0291 Cutaneous abscess, unspecified: Secondary | ICD-10-CM

## 2011-03-15 DIAGNOSIS — F339 Major depressive disorder, recurrent, unspecified: Secondary | ICD-10-CM

## 2011-03-15 MED ORDER — DOXYCYCLINE HYCLATE 100 MG PO CAPS: 100.0000 mg | ORAL_CAPSULE | Freq: Two times a day (BID) | ORAL | Status: DC

## 2011-03-15 MED ORDER — FLUTICASONE PROPIONATE 50 MCG/ACT NA SUSP: 2.0000 | Freq: Every day | NASAL | Status: DC

## 2011-03-15 MED ORDER — VENLAFAXINE HCL 75 MG PO TABS: 75.0000 mg | ORAL_TABLET | Freq: Two times a day (BID) | ORAL | Status: DC

## 2011-03-15 MED ORDER — DOXYCYCLINE HYCLATE 100 MG PO CAPS: 100.0000 mg | ORAL_CAPSULE | Freq: Two times a day (BID) | ORAL | Status: AC

## 2011-03-15 NOTE — Patient Instructions (Signed)
Allergic Rhinitis Allergic rhinitis is when the mucous membranes in the nose respond to allergens. Allergens are particles in the air that cause your body to have an allergic reaction. This causes you to release allergic antibodies. Through a chain of events, these eventually cause you to release histamine into the blood stream (hence the use of antihistamines). Although meant to be protective to the body, it is this release that causes your discomfort, such as frequent sneezing, congestion and an itchy runny nose.  CAUSES The pollen allergens may come from grasses, trees, and weeds. This is seasonal allergic rhinitis, or "hay fever." Other allergens cause year-round allergic rhinitis (perennial allergic rhinitis) such as house dust mite allergen, pet dander and mold spores.  SYMPTOMS  Nasal stuffiness (congestion).   Runny, itchy nose with sneezing and tearing of the eyes.   There is often an itching of the mouth, eyes and ears.  It cannot be cured, but it can be controlled with medications. DIAGNOSIS If you are unable to determine the offending allergen, skin or blood testing may find it. TREATMENT  Avoid the allergen.   Medications and allergy shots (immunotherapy) can help.   Hay fever may often be treated with antihistamines in pill or nasal spray forms. Antihistamines block the effects of histamine. There are over-the-counter medicines that may help with nasal congestion and swelling around the eyes. Check with your caregiver before taking or giving this medicine.  If the treatment above does not work, there are many new medications your caregiver can prescribe. Stronger medications may be used if initial measures are ineffective. Desensitizing injections can be used if medications and avoidance fails. Desensitization is when a patient is given ongoing shots until the body becomes less sensitive to the allergen. Make sure you follow up with your caregiver if problems continue. SEEK  MEDICAL CARE IF:   You develop fever (more than 100.60F (38.1 C).   You develop a cough that does not stop easily (persistent).   You have shortness of breath.   You start wheezing.   Symptoms interfere with normal daily activities.  Document Released: 02/16/2001 Document Re-Released: 06/15/2009 [facility] Patient Information 2011 [place], [place].Adhesive Capsulitis Sometimes the shoulder becomes stiff and is painful to move. Some people say it feels as if the shoulder is frozen in place. Because of this, the condition is called "frozen shoulder." Its medical name is adhesive capsulitis.  The shoulder joint is made up of strong connective tissue that attaches the ball of the humerus to the shallow shoulder socket. This strong connective tissue is called the joint capsule. This tissue can become stiff and swollen. That is when adhesive capsulitis sets in. CAUSES It is not always clear just what the cause adhesive capsulitis. Possibilities include:  Injury to the shoulder joint.   Strain. This is a repetitive injury brought about by overuse.   Lack of use. Perhaps your arm or hand was otherwise injured. It might have been in a sling for awhile. Or perhaps you were not using it to avoid pain.   Referred pain. This is a sort of trick the body plays. You feel pain in the shoulder. But, the pain actually comes from an injury somewhere else in the body.   Long-standing health problems. Several diseases can cause adhesive capsulitis. They include diabetes, heart disease, stroke, thyroid problems, rheumatoid arthritis and lung disease.   Being a women older than 40. Anyone can develop adhesive capsulitis but it is most common in women in this age group.  SYMPTOMS  Pain.   It occurs when the arm is moved.   Parts of the shoulder might hurt if they are touched.   Pain is worse at night or when resting.   Soreness. It might not be strong enough to be called pain. But, the shoulder aches.     The shoulder does not move freely.   Muscle spasms.   Trouble sleeping because of shoulder ache or pain.  DIAGNOSIS To decide if you have adhesive capsulitis, your healthcare provider will probably:  Ask about symptoms you have noticed.   Ask about your history of joint pain and anything that might have caused the pain.   Ask about your overall health.   Use hands to feel your shoulder and neck.   Ask you to move your shoulder in specific directions. This may indicate the origin of the pain.   Order imaging tests; pictures of the shoulder. They help pinpoint the source of the problem. An X-ray might be used. For more detail, an MRI is often used. An MRI details the tendons, muscles and ligaments as well as the joint.  TREATMENT Adhesive capsulitis can be treated several ways. Most treatments can be done in a clinic or in your healthcare provider's office. Be sure to discuss the different options with your caregiver. They include:  Physical therapy. You will work on specific exercises to get your shoulder moving again. The exercises usually involve stretching. A physical therapist (a caregiver with special training) can show you what to do and what not to do. The exercises will need to be done daily.   Medication.   Over-the-counter medicines may relieve pain and inflammation (the body's way of reacting to injury or infection).   Corticosteroids. These are stronger drugs to reduce pain and inflammation. They are given by injection (shots) into the shoulder joint. Frequent treatment is not recommended.   Muscle relaxants. Medication may be prescribed to ease muscle spasms.   Treatment of underlying conditions. This means treating another condition that is causing your shoulder problem. This might be a rotator cuff (tendon) problem   Shoulder manipulation. The shoulder will be moved by your healthcare provider. You would be under general anesthesia (given a drug that puts you to  sleep). You would not feel anything. Sometimes the joint will be injected with salt water (saline) at high pressure to break down internal scarring in the joint capsule.   Surgery. This is rarely needed. It may be suggested in advanced cases after all other treatment has failed.  PROGNOSIS In time, most people recover from adhesive capsulitis. Sometimes, however, the pain goes away but full movement of the shoulder does not return.  HOME CARE INSTRUCTIONS  Take any pain medications recommended by your healthcare provider. Follow the directions carefully.   If you have physical therapy, follow through with the therapist's suggestions. Be sure you understand the exercises you will be doing. You should understand:   How often the exercises should be done.   How many times each exercise should be repeated.   How long they should be done.   What other activities you should do, or not do.   That you should warm up before doing any exercise. Just 5 to 10 minutes will help. Small, gentle movements should get your shoulder ready for more.   Avoid high-demand exercise that involves your shoulder such as throwing. This type of exercise can make pain worse.   Consider using cold packs. Cold may ease swelling and  pain. Ask your healthcare provider if a cold pack might help you. If so, get directions on how and when to use them.  SEEK MEDICAL CARE IF:  You have any questions about your medications.   Your pain continues to increase.  Document Released: 03/21/2009  [facility] Patient Information 2011 [place], [place].Anxiety and Panic Attacks Your caregiver has informed you that you are having an anxiety or panic attack. There may be many forms of this. Most of the time these attacks come suddenly and without warning. They come at any time of day, including periods of sleep, and at any time of life. They may be strong and unexplained. Although panic attacks are very scary, they are physically harmless.  Sometimes the cause of your anxiety is not known. Anxiety is a protective mechanism of the body in its fight or flight mechanism. Most of these perceived danger situations are actually nonphysical situations (such as anxiety over losing a job). CAUSES The causes of an anxiety or panic attack are many. Panic attacks may occur in otherwise healthy people given a certain set of circumstances. There may be a genetic cause for panic attacks. Some medications may also have anxiety as a side effect. SYMPTOMS Some of the most common feelings are:  Intense terror.  Dizziness, feeling faint.   Hot and cold flashes.   Fear of going crazy.   Feelings that nothing is real.   Sweating.   Shaking.   Chest pain or a fast heartbeat (palpitations).  Smothering, choking sensations.   Feelings of impending doom and that death is near.   Tingling of extremities, this may be from over breathing.   Altered reality (derealization).   Being detached from yourself (depersonalization).   Several symptoms can be present to make up anxiety or panic attacks. DIAGNOSIS The evaluation by your caregiver will depend on the type of symptoms you are experiencing. The diagnosis of anxiety or pain attack is made when no physical illness can be determined to be a cause of the symptoms. TREATMENT Treatment to prevent anxiety and panic attacks may include:  Avoidance of circumstances that cause anxiety.   Reassurance and relaxation.   Regular exercise.   Relaxation therapies, such as yoga.   Psychotherapy with a psychiatrist or therapist.   Avoidance of caffeine, alcohol and illegal drugs.   Prescribed medication.  SEEK IMMEDIATE MEDICAL CARE IF:  You experience panic attack symptoms that are different than your usual symptoms.   You have any worsening or concerning symptoms.  Document Released: 05/24/2005 Document Re-Released: 11/11/2009 [facility] Patient Information 2011 [place],  [place].Cellulitis Cellulitis is an infection of the skin and the tissue beneath it. The area is typically red and tender. It is caused by germs (bacteria) (usually staph or strep) that enter the body through cuts or sores. Cellulitis most commonly occurs in the arms or lower legs.  HOME CARE INSTRUCTIONS  If you are given a prescription for medications which kill germs (antibiotics), take as directed until finished.   If the infection is on the arm or leg, keep the limb elevated as able.   Use a warm cloth several times per day to relieve pain and encourage healing.   See your caregiver for recheck of the infected site in 101 or sooner if problems arise.   Only take over-the-counter or prescription medicines for pain, discomfort, or fever as directed by your caregiver.  SEEK MEDICAL CARE IF:  An oral temperature above 101 develops, not controlled by medication.   The  area of redness (inflammation) is spreading, there are red streaks coming from the infected site, or if a part of the infection begins to turn dark in color.   The joint or bone underneath the infected skin becomes painful after the skin has healed.   The infection returns in the same or another area after it seems to have gone away.   A boil or bump swells up. This may be an abscess.   New, unexplained problems such as pain or fever develop.  SEEK IMMEDIATE MEDICAL CARE IF:  You or your child feels drowsy or lethargic.   There is vomiting, diarrhea, or lasting discomfort or feeling ill (malaise) with muscle aches and pains.  MAKE SURE YOU:   Understand these instructions.   Will watch your condition.   Will get help right away if you are not doing well or get worse.  Document Released: 03/03/2005 Document Re-Released: 03/21/2009 [facility] Patient Information 2011 [place], [place].

## 2011-03-15 NOTE — Assessment & Plan Note (Signed)
Trial of effexor.  

## 2011-03-15 NOTE — Progress Notes (Signed)
  Subjective:    Patient ID: Carmen [name], female    DOB: 31-Oct-1960, 50 y.o.   MRN: [id number]  HPI Did not take meds this am.  She is continuing to look for a job.  She is working with vocational rehab.   She is trying to go back to school.   Wants trial of Effexor.  Is having worsening symptoms of shoulder pain.  Has long h/o this and is not doing her exercises.  She does lots of heavy lifting through work.  Has a bump on her skin, might be a boil.  It is not hurting her.       Review of Systems  Constitutional: Positive for fatigue. Negative for activity change.  HENT: Negative for facial swelling.   Respiratory: Negative for apnea, cough and shortness of breath.   Cardiovascular: Negative for chest pain.  Gastrointestinal: Negative for abdominal pain.  Genitourinary: Negative for dysuria and frequency.  Musculoskeletal: Negative for back pain.  Skin: Negative for rash.  Neurological: Negative for dizziness.  Psychiatric/Behavioral: Negative for behavioral problems and agitation.       Objective:   Physical Exam  Vitals reviewed. Constitutional: She appears well-developed and well-nourished.  HENT:  Head: Normocephalic and atraumatic.  Cardiovascular: Normal rate.   Pulmonary/Chest: Effort normal.  Abdominal: Soft.  Skin:             Assessment & Plan:  Likely MRSA cellulitis, keep it covered, local treatment + abx. Resume BP meds.

## 2011-03-15 NOTE — Assessment & Plan Note (Signed)
Needs to take BP meds consistently

## 2011-03-15 NOTE — Assessment & Plan Note (Signed)
Continue exercises as previously prescribed.  NSAIDS if needed.

## 2011-04-01 ENCOUNTER — Ambulatory Visit: Payer: Self-pay | Admitting: Family Medicine

## 2011-04-06 ENCOUNTER — Ambulatory Visit (INDEPENDENT_AMBULATORY_CARE_PROVIDER_SITE_OTHER): Payer: Self-pay | Admitting: Family Medicine

## 2011-04-06 ENCOUNTER — Encounter: Payer: Self-pay | Admitting: Family Medicine

## 2011-04-06 VITALS — BP 134/80 | HR 60 | Ht 62.0 in | Wt 224.0 lb

## 2011-04-06 DIAGNOSIS — N912 Amenorrhea, unspecified: Secondary | ICD-10-CM | POA: Insufficient documentation
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
## 2011-04-26 ENCOUNTER — Encounter: Payer: Self-pay | Admitting: Family Medicine

## 2011-04-26 ENCOUNTER — Ambulatory Visit (INDEPENDENT_AMBULATORY_CARE_PROVIDER_SITE_OTHER): Payer: Self-pay | Admitting: Family Medicine

## 2011-04-26 VITALS — BP 164/94 | HR 72 | Temp 97.9°F | Ht 62.0 in | Wt 225.4 lb

## 2011-04-26 DIAGNOSIS — N912 Amenorrhea, unspecified: Secondary | ICD-10-CM

## 2011-04-26 DIAGNOSIS — J069 Acute upper respiratory infection, unspecified: Secondary | ICD-10-CM

## 2011-04-26 NOTE — Patient Instructions (Addendum)
Try to avoid decongestants.  Use anti-histamines and Mucinex if desired.  May also use dextromethorphan for cough suppression, i.e. Robitussin.  Cerumen Impaction A cerumen impaction is when the wax in your ear forms a plug. This plug usually causes reduced hearing. Sometimes it also causes an earache or dizziness. Removing a cerumen impaction can be difficult and painful. The wax sticks to the ear canal. The canal is sensitive and bleeds easily. If you try to remove a heavy wax buildup with a cotton tipped swab, you may push it in further. Irrigation with water, suction, and small ear curettes may be used to clear out the wax. If the impaction is fixed to the skin in the ear canal, ear drops may be needed for a few days to loosen the wax. People who build up a lot of wax frequently can use ear wax removal products available in your local drugstore. SEEK MEDICAL CARE IF:  You develop an earache, increased hearing loss, or marked dizziness. Document Released: 07/01/2004 Document Revised: 02/03/2011 Document Reviewed: 08/21/2009 [facility] Patient Information 2012 [place], [place].  Upper Respiratory Infection, Adult An upper respiratory infection (URI) is also known as the common cold. It is often caused by a type of germ (virus). Colds are easily spread (contagious). You can pass it to others by kissing, coughing, sneezing, or drinking out of the same glass. Usually, you get better in 1 or 2 weeks.  HOME CARE   Only take medicine as told by your doctor.   Use a warm mist humidifier or breathe in steam from a hot shower.   Drink enough water and fluids to keep your pee (urine) clear or pale yellow.   Get plenty of rest.   Return to work when your temperature is back to normal or as told by your doctor. You may use a face mask and wash your hands to stop your cold from spreading.  GET HELP RIGHT AWAY IF:   After the first few days, you feel you are getting worse.   You have questions about your  medicine.   You have chills, shortness of breath, or brown or red spit (mucus).   You have yellow or brown snot (nasal discharge) or pain in the face, especially when you bend forward.   You have a fever, puffy (swollen) neck, pain when you swallow, or white spots in the back of your throat.   You have a bad headache, ear pain, sinus pain, or chest pain.   You have a high-pitched whistling sound when you breathe in and out (wheezing).   You have a lasting cough or cough up blood.   You have sore muscles or a stiff neck.  MAKE SURE YOU:   Understand these instructions.   Will watch your condition.   Will get help right away if you are not doing well or get worse.  Document Released: 11/10/2007 Document Revised: 02/03/2011 Document Reviewed: 09/28/2010 [facility] Patient Information 2012 [place], [place].

## 2011-04-26 NOTE — Progress Notes (Signed)
  Subjective:    Patient ID: Carmen [name], female    DOB: 04-29-1961, 50 y.o.   MRN: [id number]  URI  This is a new problem. The current episode started in the past 7 days. The problem has been gradually worsening. There has been no fever. Associated symptoms include congestion, coughing, headaches, rhinorrhea, sneezing and a sore throat. Pertinent negatives include no wheezing. She has tried antihistamine and decongestant for the symptoms.      Review of Systems  HENT: Positive for congestion, sore throat, rhinorrhea and sneezing.   Respiratory: Positive for cough. Negative for wheezing.   Genitourinary: Negative for frequency and hematuria. Menstrual problem: amenorrhea, neg UPT.  Musculoskeletal: Negative for back pain and joint swelling.  Neurological: Positive for headaches.       Objective:   Physical Exam  Vitals reviewed. Constitutional: She appears well-developed and well-nourished.  HENT:  Head: Normocephalic and atraumatic.  Mouth/Throat: Oropharynx is clear and moist. No oropharyngeal exudate.  Eyes: No scleral icterus.  Neck: Normal range of motion.  Cardiovascular: Normal rate and regular rhythm.   Pulmonary/Chest: Effort normal and breath sounds normal.  Abdominal: Soft.  Musculoskeletal: Normal range of motion.  Skin: Skin is warm and dry.  Psychiatric: She has a normal mood and affect.          Assessment & Plan:  URI--symptomatic treatment.  Out of work until Saturday so she will not infect others. Amenorrhea- neg UPT--likely hormonal/perimenopausal

## 2011-08-02 ENCOUNTER — Other Ambulatory Visit: Payer: Self-pay | Admitting: Family Medicine

## 2011-08-02 DIAGNOSIS — I10 Essential (primary) hypertension: Secondary | ICD-10-CM

## 2011-08-02 MED ORDER — ATENOLOL 50 MG PO TABS: 50.0000 mg | ORAL_TABLET | Freq: Two times a day (BID) | ORAL | Status: DC

## 2011-08-02 NOTE — Telephone Encounter (Signed)
Patient is calling because she isn't sure if she has any refills on Atenolol and would need it sent to [facility] in [place].  She was going to make an appt to be seen but couldn't get it into her schedule.  She will call back in May to schedule another appt to be seen.

## 2011-08-02 NOTE — Telephone Encounter (Signed)
Informed female who answered the phone that Rx were at pharmacy. Sejal Cofield, Maryjo [place]

## 2011-08-11 ENCOUNTER — Encounter: Payer: Self-pay | Admitting: Family Medicine

## 2011-08-11 ENCOUNTER — Ambulatory Visit (INDEPENDENT_AMBULATORY_CARE_PROVIDER_SITE_OTHER): Payer: Self-pay | Admitting: Family Medicine

## 2011-08-11 DIAGNOSIS — F339 Major depressive disorder, recurrent, unspecified: Secondary | ICD-10-CM

## 2011-08-11 DIAGNOSIS — H6120 Impacted cerumen, unspecified ear: Secondary | ICD-10-CM

## 2011-08-11 NOTE — Assessment & Plan Note (Signed)
Patient reports increasing anxiety related to social situation in her home.  She denies SI at this time.  Is taking Effexor.  May wish to weigh the risk/benefit of Effexor in this patient with diagnosis of HTN, given venlafaxine's effect on BP.

## 2011-08-11 NOTE — Assessment & Plan Note (Signed)
Patient with cerumen impaction in R ear; improved with irrigation in office today.  No abnormalities of TM or EAC after irritation.  Patient's hearing is grossly intact by my exam today in office. Discussed management of her other seasonal/episodic allergic symptoms, including congestion.  Of note, I discussed the concern about using otc decongestants given her diagosis of HTN and her elevated diastolic BP today.
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
## 2011-08-11 NOTE — Patient Instructions (Signed)
We successfully washed the debris out of your right ear.  The eardrum looks healthy and normal.   Sometimes ear complaints can be related to other problems that are not located in the ear.  Things like teeth grinding, TMJ pain, and dental pain can cause ear pain.    I recommend treatment for your sinus congestion/allergy with nasal saline spray and Flonase 2 sprays in each nostil one time daily; Mucinex 600mg  one tablet every 12 hours to thin the mucus; and increased hydration.  You may continue with the antihistamines as needed.  I do not recommend the decongestants or Sudafed due to your high blood pressure.   Please follow up if your symptoms worsen or do not get better in the coming 1-2 weeks.

## 2011-08-19 ENCOUNTER — Ambulatory Visit (INDEPENDENT_AMBULATORY_CARE_PROVIDER_SITE_OTHER): Payer: Self-pay | Admitting: Family Medicine

## 2011-08-19 ENCOUNTER — Encounter: Payer: Self-pay | Admitting: Family Medicine

## 2011-08-19 VITALS — BP 126/79 | HR 61 | Temp 98.2°F | Ht 62.0 in | Wt 229.0 lb

## 2011-08-19 DIAGNOSIS — J309 Allergic rhinitis, unspecified: Secondary | ICD-10-CM

## 2011-08-19 DIAGNOSIS — I10 Essential (primary) hypertension: Secondary | ICD-10-CM

## 2011-08-19 DIAGNOSIS — E8881 Metabolic syndrome: Secondary | ICD-10-CM | POA: Insufficient documentation

## 2011-08-19 MED ORDER — FLUTICASONE PROPIONATE 50 MCG/ACT NA SUSP: 2.0000 | Freq: Every day | NASAL | Status: DC

## 2011-08-19 MED ORDER — ATENOLOL 50 MG PO TABS: 50.0000 mg | ORAL_TABLET | Freq: Two times a day (BID) | ORAL | Status: DC

## 2011-08-19 NOTE — Patient Instructions (Signed)
Allergic Rhinitis  Allergic rhinitis is when the mucous membranes in the nose respond to allergens. Allergens are particles in the air that cause your body to have an allergic reaction. This causes you to release allergic antibodies. Through a chain of events, these eventually cause you to release histamine into the blood stream (hence the use of antihistamines). Although meant to be protective to the body, it is this release that causes your discomfort, such as frequent sneezing, congestion and an itchy runny nose.    CAUSES    The pollen allergens may come from grasses, trees, and weeds. This is seasonal allergic rhinitis, or "hay fever." Other allergens cause year-round allergic rhinitis (perennial allergic rhinitis) such as house dust mite allergen, pet dander and mold spores.    SYMPTOMS     Nasal stuffiness (congestion).   Runny, itchy nose with sneezing and tearing of the eyes.   There is often an itching of the mouth, eyes and ears.  It cannot be cured, but it can be controlled with medications.  DIAGNOSIS    If you are unable to determine the offending allergen, skin or blood testing may find it.  TREATMENT     Avoid the allergen.   Medications and allergy shots (immunotherapy) can help.   Hay fever may often be treated with antihistamines in pill or nasal spray forms. Antihistamines block the effects of histamine. There are over-the-counter medicines that may help with nasal congestion and swelling around the eyes. Check with your caregiver before taking or giving this medicine.  If the treatment above does not work, there are many new medications your caregiver can prescribe. Stronger medications may be used if initial measures are ineffective. Desensitizing injections can be used if medications and avoidance fails. Desensitization is when a patient is given ongoing shots until the body becomes less sensitive to the allergen. Make sure you follow up with your caregiver if problems continue.  SEEK  MEDICAL CARE IF:     You develop fever (more than 100.5 F (38.1 C).   You develop a cough that does not stop easily (persistent).   You have shortness of breath.   You start wheezing.   Symptoms interfere with normal daily activities.  Document Released: 02/16/2001 Document Revised: 05/13/2011 Document Reviewed: 08/28/2008  [employer] Patient Information 2012 [employer].
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
## 2011-08-19 NOTE — Assessment & Plan Note (Signed)
Resume Flonase, Mucinex prn po, and anti-histamine, avoid decongestants

## 2011-08-19 NOTE — Assessment & Plan Note (Signed)
Continue current meds as BP is well controlled.

## 2011-09-21 ENCOUNTER — Ambulatory Visit: Payer: Self-pay | Admitting: Sports Medicine

## 2011-09-24 ENCOUNTER — Ambulatory Visit (INDEPENDENT_AMBULATORY_CARE_PROVIDER_SITE_OTHER): Payer: Self-pay | Admitting: Family Medicine

## 2011-09-24 ENCOUNTER — Encounter: Payer: Self-pay | Admitting: Family Medicine

## 2011-09-24 VITALS — BP 138/92 | HR 89 | Temp 97.1°F | Ht 63.0 in | Wt 233.7 lb

## 2011-09-24 DIAGNOSIS — J309 Allergic rhinitis, unspecified: Secondary | ICD-10-CM

## 2011-09-24 DIAGNOSIS — B368 Other specified superficial mycoses: Secondary | ICD-10-CM

## 2011-09-24 DIAGNOSIS — H6240 Otitis externa in other diseases classified elsewhere, unspecified ear: Secondary | ICD-10-CM

## 2011-09-24 DIAGNOSIS — B369 Superficial mycosis, unspecified: Secondary | ICD-10-CM

## 2011-09-24 NOTE — Assessment & Plan Note (Addendum)
Most likely diagnosis based on presentation and physical exam. Plan to treat with clotrimazole over-the-counter cream. Followup in about 2 weeks to assess for improvement. She may need ear irrigation to help clear out remnants at that time. 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 Stated that can take up to 4-6 weeks before totally clears.

## 2011-09-24 NOTE — Progress Notes (Signed)
  Subjective:    Patient ID: Carmen [name], female    DOB: [birth year], 51 y.o.   MRN: [id number]  HPI 1.  Seasonal allergies: Long-term problem for patient. In the past mostly as rhinitis. Also some trouble with itchy eyes. She is on a combination of chlorpheniramine and Sudafed. She states she alternates these. She states she knows that the Sudafed is not good for her blood pressure but she has not had anything else that helps with her symptoms. No fevers or chills. No recent illnesses.  2.  Ear stuffiness and itching:  Patient has had chronic in recurrent versus long-standing cerumen impaction. She states she has had ear irrigation before recently which revealed thick white material. She has been given within the Cortisporin Otic (mixture polymyxin and neomycin) with no relief. States that her ear feels itchy on most days and has a feeling of stuffiness deep inside ear canal. She does state that she uses Q-tips intermittently.   Review of Systems See HPI above for review of systems.       Objective:   Physical Exam  BP 138/92  Pulse 89  Temp(Src) 97.1 F (36.2 C) (Oral)  Ht 5\' 3"  (1.6 m)  Wt 233 lb 11.2 oz (106.006 kg)  BMI 41.40 kg/m2 Gen:  Patient sitting on exam table, appears stated age in no acute distress Head: Normocephalic atraumatic Eyes: EOMI, PERRL, sclera and conjunctiva non-erythematous Nose:  Nasal turbinates grossly enlarged bilaterally. Some exudates noted. Tender to palpation of maxillary sinus  Ears:  Left ear canal clear. Pearly gray tympanic membranes. 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 Ear canal nonerythematous. From what I can see the tympanic membrane is pearly gray and not inflamed appearing Mouth: Mucosa membranes moist. Tonsils +2, nonenlarged, non-erythematous. Neck: No cervical lymphadenopathy noted Heart:  RRR, no murmurs auscultated. Pulm:  Clear to auscultation bilaterally with good air movement.  No wheezes or  rales noted.          Assessment & Plan:

## 2011-09-24 NOTE — Assessment & Plan Note (Signed)
She is currently on a combination of pseudoephedrine and chlorpheniramine. Recommended she switch to over-the-counter second generation antihistamine. Cost has always been an issue for her. Believe that the generic of these will roughly cost the same as the chlorpheniramine and have the potential to cause less side effects.

## 2011-09-24 NOTE — Patient Instructions (Signed)
You have a fungal infection under her right ear. I have provided U. some information on this. The best treatment for this is over-the-counter Chlortrimazole 1% cream. You can place or swab this into your ear and allow it to melt down. It takes about 10-14 days to really see an effect.  The names of the generic over-the-counter allergy medicines are cetirizine (Zyrtec), loratadine (Claritin), and fexofenadine (Allegra). These come in D-formulations which have the pseudoephedrine already mixed in.  Find one that works and stick with it.  Otomycosis Otomycosis is a fungus infection of the ear canal. This may cause an earache, hearing loss, and large amounts of debris to accumulate in the ear canal. Otomycosis is more common in humid climates and is usually caused by candida or aspegillus. External ear infections that do not respond to antibiotic ear drops may be due to a fungus. The most common symptom or complaint is itching or itching deep in the ear canal. Unless your eardrum has a hole in it, anti-fungal medicines used on the skin can be used 2 to 3 times daily in the ear canal after the debris is cleaned out thoroughly. Two weeks of treatment are usually needed, but careful follow-up is recommended. Fungus infections can be difficult to cure. Except for your ear drops, the ear canal must be kept dry until the infection is cured. Do not go swimming or use ear plugs. Protect your ear canal with a cotton ball covered with petroleum jelly while showering. Call your caregiver if you are not better after several days of treatment.  SEEK IMMEDIATE MEDICAL CARE IF: You develop severe pain, increased hearing loss, dizziness, vomiting, a high fever, or other serious symptoms. Document Released: 07/01/2004 Document Revised: 05/13/2011 Document Reviewed: 09/18/2008 [facility] Patient Information 2012 [place], [place].

## 2011-10-05 ENCOUNTER — Ambulatory Visit (INDEPENDENT_AMBULATORY_CARE_PROVIDER_SITE_OTHER): Payer: Self-pay | Admitting: Family Medicine

## 2011-10-05 ENCOUNTER — Encounter: Payer: Self-pay | Admitting: Family Medicine

## 2011-10-05 VITALS — BP 170/85 | HR 71 | Temp 98.2°F | Ht 63.0 in | Wt 233.0 lb

## 2011-10-05 DIAGNOSIS — I10 Essential (primary) hypertension: Secondary | ICD-10-CM

## 2011-10-05 DIAGNOSIS — E669 Obesity, unspecified: Secondary | ICD-10-CM

## 2011-10-05 DIAGNOSIS — J309 Allergic rhinitis, unspecified: Secondary | ICD-10-CM

## 2011-10-05 DIAGNOSIS — K589 Irritable bowel syndrome without diarrhea: Secondary | ICD-10-CM

## 2011-10-05 DIAGNOSIS — G5600 Carpal tunnel syndrome, unspecified upper limb: Secondary | ICD-10-CM

## 2011-10-05 DIAGNOSIS — F339 Major depressive disorder, recurrent, unspecified: Secondary | ICD-10-CM

## 2011-10-05 DIAGNOSIS — B369 Superficial mycosis, unspecified: Secondary | ICD-10-CM

## 2011-10-05 DIAGNOSIS — M129 Arthropathy, unspecified: Secondary | ICD-10-CM

## 2011-10-05 DIAGNOSIS — H6240 Otitis externa in other diseases classified elsewhere, unspecified ear: Secondary | ICD-10-CM

## 2011-10-05 DIAGNOSIS — K219 Gastro-esophageal reflux disease without esophagitis: Secondary | ICD-10-CM

## 2011-10-05 DIAGNOSIS — F603 Borderline personality disorder: Secondary | ICD-10-CM

## 2011-10-05 DIAGNOSIS — B368 Other specified superficial mycoses: Secondary | ICD-10-CM

## 2011-10-05 DIAGNOSIS — M7500 Adhesive capsulitis of unspecified shoulder: Secondary | ICD-10-CM

## 2011-10-05 NOTE — Assessment & Plan Note (Addendum)
Advised again steroid nasal spray would be helpful.  She will try this.

## 2011-10-05 NOTE — Progress Notes (Signed)
  Subjective:    Patient ID: Carmen [name], female    DOB: [birth year], 51 y.o.   MRN: [id number]  HPI Still having sinus pressure and issues with ear pressure.  Taking sudafed, mucinex, occasional Flonase, and chlorpheniramine.  Upset today about friend needing money.  Her grand kids are in foster care. Knows where they are. 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  Still having itching and wants it to be irrigated today.  Wants to know about oral treatment for this.   Review of Systems  Constitutional: Negative for fever and fatigue.  HENT: Positive for congestion, rhinorrhea, sneezing and postnasal drip.   Respiratory: Negative for cough and choking.   Cardiovascular: Negative for chest pain.  Genitourinary: Negative for dysuria.  Neurological: Positive for headaches.       Objective:   Physical Exam  Vitals reviewed. Constitutional: She appears well-developed and well-nourished.  HENT:  Head: Normocephalic and atraumatic.  Right Ear: There is drainage.       Right EAC with white adherent drainage.  Removed what was near external canal.  Neck: Neck supple.  Cardiovascular: Normal rate and regular rhythm.   Pulmonary/Chest: Effort normal.  Abdominal: Soft. She exhibits no mass. There is no tenderness.          Assessment & Plan:

## 2011-10-05 NOTE — Assessment & Plan Note (Signed)
BP is up today but has been better usually, may be medication related.

## 2011-10-05 NOTE — Patient Instructions (Addendum)
Allergic Rhinitis Allergic rhinitis is when the mucous membranes in the nose respond to allergens. Allergens are particles in the air that cause your body to have an allergic reaction. This causes you to release allergic antibodies. Through a chain of events, these eventually cause you to release histamine into the blood stream (hence the use of antihistamines). Although meant to be protective to the body, it is this release that causes your discomfort, such as frequent sneezing, congestion and an itchy runny nose.  CAUSES  The pollen allergens may come from grasses, trees, and weeds. This is seasonal allergic rhinitis, or "hay fever." Other allergens cause year-round allergic rhinitis (perennial allergic rhinitis) such as house dust mite allergen, pet dander and mold spores.  SYMPTOMS   Nasal stuffiness (congestion).   Runny, itchy nose with sneezing and tearing of the eyes.   There is often an itching of the mouth, eyes and ears.  It cannot be cured, but it can be controlled with medications. DIAGNOSIS  If you are unable to determine the offending allergen, skin or blood testing may find it. TREATMENT   Avoid the allergen.   Medications and allergy shots (immunotherapy) can help.   Hay fever may often be treated with antihistamines in pill or nasal spray forms. Antihistamines block the effects of histamine. There are over-the-counter medicines that may help with nasal congestion and swelling around the eyes. Check with your caregiver before taking or giving this medicine.  If the treatment above does not work, there are many new medications your caregiver can prescribe. Stronger medications may be used if initial measures are ineffective. Desensitizing injections can be used if medications and avoidance fails. Desensitization is when a patient is given ongoing shots until the body becomes less sensitive to the allergen. Make sure you follow up with your caregiver if problems continue. SEEK  MEDICAL CARE IF:   You develop fever (more than 100.5 F (38.1 C).   You develop a cough that does not stop easily (persistent).   You have shortness of breath.   You start wheezing.   Symptoms interfere with normal daily activities.  Document Released: 02/16/2001 Document Revised: 05/13/2011 Document Reviewed: 08/28/2008 [facility] Patient Information 2012 [place], [place]. Otomycosis Otomycosis is a fungus infection of the ear canal. This may cause an earache, hearing loss, and large amounts of debris to accumulate in the ear canal. Otomycosis is more common in humid climates and is usually caused by candida or aspegillus. External ear infections that do not respond to antibiotic ear drops may be due to a fungus. The most common symptom or complaint is itching or itching deep in the ear canal. Unless your eardrum has a hole in it, anti-fungal medicines used on the skin can be used 2 to 3 times daily in the ear canal after the debris is cleaned out thoroughly. Two weeks of treatment are usually needed, but careful follow-up is recommended. Fungus infections can be difficult to cure. Except for your ear drops, the ear canal must be kept dry until the infection is cured. Do not go swimming or use ear plugs. Protect your ear canal with a cotton ball covered with petroleum jelly while showering. Call your caregiver if you are not better after several days of treatment.  SEEK IMMEDIATE MEDICAL CARE IF: You develop severe pain, increased hearing loss, dizziness, vomiting, a high fever, or other serious symptoms. Document Released: 07/01/2004 Document Revised: 05/13/2011 Document Reviewed: 09/18/2008 [facility] Patient Information 2012 [place], [place]. Allergic Rhinitis Allergic rhinitis is  when the mucous membranes in the nose respond to allergens. Allergens are particles in the air that cause your body to have an allergic reaction. This causes you to release allergic antibodies. Through a chain of  events, these eventually cause you to release histamine into the blood stream (hence the use of antihistamines). Although meant to be protective to the body, it is this release that causes your discomfort, such as frequent sneezing, congestion and an itchy runny nose.  CAUSES  The pollen allergens may come from grasses, trees, and weeds. This is seasonal allergic rhinitis, or "hay fever." Other allergens cause year-round allergic rhinitis (perennial allergic rhinitis) such as house dust mite allergen, pet dander and mold spores.  SYMPTOMS   Nasal stuffiness (congestion).   Runny, itchy nose with sneezing and tearing of the eyes.   There is often an itching of the mouth, eyes and ears.  It cannot be cured, but it can be controlled with medications. DIAGNOSIS  If you are unable to determine the offending allergen, skin or blood testing may find it. TREATMENT   Avoid the allergen.   Medications and allergy shots (immunotherapy) can help.   Hay fever may often be treated with antihistamines in pill or nasal spray forms. Antihistamines block the effects of histamine. There are over-the-counter medicines that may help with nasal congestion and swelling around the eyes. Check with your caregiver before taking or giving this medicine.  If the treatment above does not work, there are many new medications your caregiver can prescribe. Stronger medications may be used if initial measures are ineffective. Desensitizing injections can be used if medications and avoidance fails. Desensitization is when a patient is given ongoing shots until the body becomes less sensitive to the allergen. Make sure you follow up with your caregiver if problems continue. SEEK MEDICAL CARE IF:   You develop fever (more than 100.5 F (38.1 C).   You develop a cough that does not stop easily (persistent).   You have shortness of breath.   You start wheezing.   Symptoms interfere with normal daily activities.  Document  Released: 02/16/2001 Document Revised: 05/13/2011 Document Reviewed: 08/28/2008 [facility] Patient Information 2012 [place], [place].

## 2011-10-05 NOTE — Assessment & Plan Note (Signed)
S/p irrigation and continue lotrimin.  Nothing new to do today.

## 2011-10-13 ENCOUNTER — Ambulatory Visit (INDEPENDENT_AMBULATORY_CARE_PROVIDER_SITE_OTHER): Payer: Self-pay | Admitting: Family Medicine

## 2011-10-13 ENCOUNTER — Encounter: Payer: Self-pay | Admitting: Family Medicine

## 2011-10-13 VITALS — BP 138/87 | HR 78 | Temp 98.4°F | Ht 63.0 in | Wt 235.0 lb

## 2011-10-13 DIAGNOSIS — B368 Other specified superficial mycoses: Secondary | ICD-10-CM

## 2011-10-13 DIAGNOSIS — B369 Superficial mycosis, unspecified: Secondary | ICD-10-CM

## 2011-10-13 NOTE — Patient Instructions (Addendum)
Stop all creams or medicine in the ear  Use a toilet paper ear wick once or twice a day  See Dr Shawnie Pons or myself in one week

## 2011-10-13 NOTE — Progress Notes (Signed)
  Subjective:    Patient ID: Carmen [name], female    DOB: January 17, 1961, 51 y.o.   MRN: [id number]  HPI  R ear fullness discharge discomfort Has had R ear problems for long time.  See Dr walden's note.   Was on cortipsporin then now is using otc lotrimin cream via Qtip. Still has full feeling and itching.  Mild pain.  No fever does have mild hearing loss.     Review of Systems     Objective:   Physical Exam  R Ear Canal nearly obscured with white creamy substance - removed with TP wick.   Canal appears irritated and red difficult to see TM which is either perforated or inflamed. Neck:  No deformities, thyromegaly, masses, or tenderness noted.   Supple with full range of motion without pain. No pain with external ear manipulation  Tympanogram R tracing is flat but volume is approximately 1 which is equal to left ear.  Tracing for left is normal      Assessment & Plan:

## 2011-10-13 NOTE — Assessment & Plan Note (Signed)
Not improved.   I am unsure if this is a fungal infection vs ear inflamation due to eczema or allergic reaction to neomycin.  Her TM seems to be involved with flat tympanogram but not ruptured.   Will have her stop all substances in the ear and use TP wick to remove debris.  See her back in one week to better assess.  She does not have insurance and would have difficulty seeing an ENT but this maybe necessary.

## 2011-10-20 ENCOUNTER — Ambulatory Visit (INDEPENDENT_AMBULATORY_CARE_PROVIDER_SITE_OTHER): Payer: Self-pay | Admitting: Family Medicine

## 2011-10-20 VITALS — BP 158/96 | HR 75 | Temp 98.2°F | Ht 63.0 in | Wt 238.5 lb

## 2011-10-20 DIAGNOSIS — H6061 Unspecified chronic otitis externa, right ear: Secondary | ICD-10-CM

## 2011-10-20 DIAGNOSIS — H608X9 Other otitis externa, unspecified ear: Secondary | ICD-10-CM

## 2011-10-20 MED ORDER — NEOMYCIN-POLYMYXIN-HC 1 % OT SOLN: 4.0000 [drp] | Freq: Four times a day (QID) | OTIC | Status: DC

## 2011-10-20 NOTE — Patient Instructions (Signed)
Otitis Externa  Otitis externa ("swimmer's ear") is a germ (bacterial) or fungal infection of the outer ear canal (from the eardrum to the outside of the ear). Swimming in dirty water may cause swimmer's ear. It also may be caused by moisture in the ear from water remaining after swimming or bathing. Often the first signs of infection may be itching in the ear canal. This may progress to ear canal swelling, redness, and pus drainage, which may be signs of infection.  HOME CARE INSTRUCTIONS    Apply the antibiotic drops to the ear canal as prescribed by your doctor.   This can be a very painful medical condition. A strong pain reliever may be prescribed.   Only take over-the-counter or prescription medicines for pain, discomfort, or fever as directed by your caregiver.   If your caregiver has given you a follow-up appointment, it is very important to keep that appointment. Not keeping the appointment could result in a chronic or permanent injury, pain, hearing loss and disability. If there is any problem keeping the appointment, you must call back to this facility for assistance.  PREVENTION    It is important to keep your ear dry. Use the corner of a towel to wick water out of the ear canal after swimming or bathing.   Avoid scratching in your ear. This can damage the ear canal or remove the protective wax lining the canal and make it easier for germs (bacteria) or a fungus to grow.   You may use ear drops made of rubbing alcohol and vinegar after swimming to prevent future "swimmer's ear" infections. Make up a small bottle of equal parts white vinegar and alcohol. Put 3 or 4 drops into each ear after swimming.   Avoid swimming in lakes, polluted water, or poorly chlorinated pools.  SEEK MEDICAL CARE IF:    An oral temperature above 102 F (38.9 C) develops.   Your ear is still painful after 3 days and shows signs of getting worse (redness, swelling, pain, or pus).  MAKE SURE YOU:    Understand these  instructions.   Will watch your condition.   Will get help right away if you are not doing well or get worse.  Document Released: 05/24/2005 Document Revised: 05/13/2011 Document Reviewed: 12/29/2007  [employer] Patient Information 2012 [employer].

## 2011-10-22 NOTE — Progress Notes (Signed)
  Subjective:    Patient ID: Carmen [name], female    DOB: Nov 10, 1960, 51 y.o.   MRN: [id number]  HPI Returns with issues about her ear.  Had been diagnosed with chronic otitis and  been placed on Lotrimin, which Dr. Deirdre Priest discontinued last week.  She had 3 wks of treatment.  She has not had culture.  She reports no significant change in sx's.  Reports ear ringing on left.  Still noting some drainage on right.  She is continuing to research online and wants to see ENT.  A lengthy discussion was had regarding treatment, ENT debridement, anti-fungal drops, whether she had been appropriately treated or not.     Review of Systems  Constitutional: Negative for fever.  HENT: Negative for sneezing and postnasal drip.   Respiratory: Negative for shortness of breath.   Gastrointestinal: Negative for abdominal pain.       Objective:   Physical Exam  Constitutional: She appears well-developed and well-nourished.  HENT:  Head: Normocephalic and atraumatic.  Right Ear: Tympanic membrane normal. There is drainage.  Left Ear: Tympanic membrane normal. No drainage.       Much less white drainage and ear canal swelling this time.  Can see TM well.  There is normal wax in canal.  Eyes: No scleral icterus.  Cardiovascular: Normal rate.   Pulmonary/Chest: Effort normal.          Assessment & Plan:  ? Chronic otitis-appears to be improving.  Pt. Desires trial of Cortisporin otic---will give.  She will research possibilities of ENT referral.  Pt. Without insurance, which may be a problem.  This was discussed at length.

## 2011-11-02 ENCOUNTER — Ambulatory Visit (INDEPENDENT_AMBULATORY_CARE_PROVIDER_SITE_OTHER): Payer: Self-pay | Admitting: Family Medicine

## 2011-11-02 ENCOUNTER — Encounter: Payer: Self-pay | Admitting: Family Medicine

## 2011-11-02 VITALS — BP 168/82 | HR 71 | Temp 98.2°F | Ht 63.0 in | Wt 238.0 lb

## 2011-11-02 DIAGNOSIS — B368 Other specified superficial mycoses: Secondary | ICD-10-CM

## 2011-11-02 DIAGNOSIS — R5383 Other fatigue: Secondary | ICD-10-CM

## 2011-11-02 DIAGNOSIS — H6240 Otitis externa in other diseases classified elsewhere, unspecified ear: Secondary | ICD-10-CM

## 2011-11-02 DIAGNOSIS — B369 Superficial mycosis, unspecified: Secondary | ICD-10-CM

## 2011-11-02 DIAGNOSIS — I10 Essential (primary) hypertension: Secondary | ICD-10-CM

## 2011-11-02 DIAGNOSIS — N912 Amenorrhea, unspecified: Secondary | ICD-10-CM

## 2011-11-02 LAB — CBC
HCT: 39.3 % (ref 36.0–46.0)
Hemoglobin: 13.8 g/dL (ref 12.0–15.0)
WBC: 7 10*3/uL (ref 4.0–10.5)

## 2011-11-02 LAB — TSH: TSH: 2.87 u[IU]/mL (ref 0.350–4.500)

## 2011-11-02 MED ORDER — HYDROCHLOROTHIAZIDE 25 MG PO TABS: 50.0000 mg | ORAL_TABLET | Freq: Every day | ORAL | Status: DC

## 2011-11-02 NOTE — Progress Notes (Signed)
  Subjective:    Patient ID: Carmen [name], female    DOB: Feb 09, 1961, 51 y.o.   MRN: [id number]  HPI She is still having ringing and humming in both ears and R>L.  Improving drainage out of his ears. Feels very fatigued.  7 wks since LMP, neg. UPT last week.  She is worried about pregnancy.  Feels depressed because she cannot get her ear to feel better.  Has not really been exercising.  Wants to be able to swim in the summer time.   Review of Systems  Constitutional: Negative for fever.  HENT: Positive for ear pain and tinnitus.   Respiratory: Negative for shortness of breath.   Cardiovascular: Negative for leg swelling.  Gastrointestinal: Negative for nausea, vomiting, diarrhea and constipation.  Genitourinary: Negative for dysuria.  Skin: Negative for rash.       Objective:   Physical Exam  Vitals reviewed. Constitutional: She appears well-developed and well-nourished.  HENT:  Head: Normocephalic and atraumatic.  Right Ear: There is drainage and swelling.  Left Ear: Tympanic membrane and ear canal normal.       White drainage.  Eyes: No scleral icterus.  Neck: Normal range of motion.          Assessment & Plan:

## 2011-11-02 NOTE — Assessment & Plan Note (Signed)
Increase HCTZ-recheck in 1 month

## 2011-11-02 NOTE — Assessment & Plan Note (Signed)
To see ENT in 2 wks.

## 2011-11-02 NOTE — Assessment & Plan Note (Signed)
Given neg. UPT will check FSH, TSH

## 2011-11-02 NOTE — Patient Instructions (Signed)
Hypertension As your heart beats, it forces blood through your arteries. This force is your blood pressure. If the pressure is too high, it is called hypertension (HTN) or high blood pressure. HTN is dangerous because you may have it and not know it. High blood pressure may mean that your heart has to work harder to pump blood. Your arteries may be narrow or stiff. The extra work puts you at risk for heart disease, stroke, and other problems.  Blood pressure consists of two numbers, a higher number over a lower, 110/72, for example. It is stated as "110 over 72." The ideal is below 120 for the top number (systolic) and under 80 for the bottom (diastolic). Write down your blood pressure today. You should pay close attention to your blood pressure if you have certain conditions such as:  Heart failure.   Prior heart attack.   Diabetes   Chronic kidney disease.   Prior stroke.   Multiple risk factors for heart disease.  To see if you have HTN, your blood pressure should be measured while you are seated with your arm held at the level of the heart. It should be measured at least twice. A one-time elevated blood pressure reading (especially in the Emergency Department) does not mean that you need treatment. There may be conditions in which the blood pressure is different between your right and left arms. It is important to see your caregiver soon for a recheck. Most people have essential hypertension which means that there is not a specific cause. This type of high blood pressure may be lowered by changing lifestyle factors such as:  Stress.   Smoking.   Lack of exercise.   Excessive weight.   Drug/tobacco/alcohol use.   Eating less salt.  Most people do not have symptoms from high blood pressure until it has caused damage to the body. Effective treatment can often prevent, delay or reduce that damage. TREATMENT  When a cause has been identified, treatment for high blood pressure is  directed at the cause. There are a large number of medications to treat HTN. These fall into several categories, and your caregiver will help you select the medicines that are best for you. Medications may have side effects. You should review side effects with your caregiver. If your blood pressure stays high after you have made lifestyle changes or started on medicines,   Your medication(s) may need to be changed.   Other problems may need to be addressed.   Be certain you understand your prescriptions, and know how and when to take your medicine.   Be sure to follow up with your caregiver within the time frame advised (usually within two weeks) to have your blood pressure rechecked and to review your medications.   If you are taking more than one medicine to lower your blood pressure, make sure you know how and at what times they should be taken. Taking two medicines at the same time can result in blood pressure that is too low.  SEEK IMMEDIATE MEDICAL CARE IF:  You develop a severe headache, blurred or changing vision, or confusion.   You have unusual weakness or numbness, or a faint feeling.   You have severe chest or abdominal pain, vomiting, or breathing problems.  MAKE SURE YOU:   Understand these instructions.   Will watch your condition.   Will get help right away if you are not doing well or get worse.  Document Released: 05/24/2005 Document Revised: 05/13/2011 Document Reviewed:   01/12/2008 [employer] Patient Information 2012 [place], [place]. Otomycosis Otomycosis is a fungus infection of the ear canal. This may cause an earache, hearing loss, and large amounts of debris to accumulate in the ear canal. Otomycosis is more common in humid climates and is usually caused by candida or aspegillus. External ear infections that do not respond to antibiotic ear drops may be due to a fungus. The most common symptom or complaint is itching or itching deep in the ear canal. Unless your  eardrum has a hole in it, anti-fungal medicines used on the skin can be used 2 to 3 times daily in the ear canal after the debris is cleaned out thoroughly. Two weeks of treatment are usually needed, but careful follow-up is recommended. Fungus infections can be difficult to cure. Except for your ear drops, the ear canal must be kept dry until the infection is cured. Do not go swimming or use ear plugs. Protect your ear canal with a cotton ball covered with petroleum jelly while showering. Call your caregiver if you are not better after several days of treatment.  SEEK IMMEDIATE MEDICAL CARE IF: You develop severe pain, increased hearing loss, dizziness, vomiting, a high fever, or other serious symptoms. Document Released: 07/01/2004 Document Revised: 05/13/2011 Document Reviewed: 09/18/2008 [facility] Patient Information 2012 [place], [place].

## 2011-11-04 ENCOUNTER — Encounter: Payer: Self-pay | Admitting: Family Medicine

## 2011-12-30 ENCOUNTER — Ambulatory Visit (INDEPENDENT_AMBULATORY_CARE_PROVIDER_SITE_OTHER): Payer: Self-pay | Admitting: Family Medicine

## 2011-12-30 ENCOUNTER — Encounter: Payer: Self-pay | Admitting: Family Medicine

## 2011-12-30 VITALS — BP 138/84 | HR 83 | Temp 97.9°F | Wt 232.0 lb

## 2011-12-30 DIAGNOSIS — L282 Other prurigo: Secondary | ICD-10-CM

## 2011-12-30 MED ORDER — TRIAMCINOLONE ACETONIDE 0.1 % EX OINT: TOPICAL_OINTMENT | Freq: Two times a day (BID) | CUTANEOUS | Status: DC

## 2011-12-30 NOTE — Assessment & Plan Note (Addendum)
A: pruritic rash L hip. Suspect resolving poison ivy vs. Eczema.  P:  -patient to try OTC Cortisone Q 10.  -if no relief with OTC cortisone try kenalog sent to pharmacy.

## 2011-12-30 NOTE — Progress Notes (Signed)
Subjective:     Patient ID: Carmen [name], female   DOB: 1960/12/23, 51 y.o.   MRN: [id number]  HPI 51 yo F with a history of obesity and metabolic syndrome presents with two weeks of itching associated with a dry somewhat flaky rash on her L hip. She admits to pulling weeds two weeks ago near some poison ivy. She additionally has itching under her breast and R pannus. She admits that her daughter is also itching but this seems to be related to bug bites. She has not used anything for the rash.   Review of Systems As per HPI  Objective:   Physical Exam BP 138/84  Pulse 83  Temp 97.9 F (36.6 C) (Oral)  Wt 232 lb (105.235 kg) General appearance: alert, cooperative and no distress Abdomen: obese, no visible rash under pannus.  Skin: Patch of xerotic skin with mild scale on her L hip. Evidence of excoriation. No hives, no skin dimpling, no streaking.   Assessment and Plan:

## 2011-12-30 NOTE — Patient Instructions (Addendum)
Carmen [name],  Thank you for coming in to see me today. Your rash is very possibly poison ivy on your hip. You do not have scabies. Use the steroid ointment for 7-10 days.  Your breast itching is likely a mild yeast: keep breast cool and dry. You can use selsum blue in this area. You can also use the steroid ointment as long as the skin is not cracked.   F/u in 2-3 weeks if your itching persist.  Dr. Armen Pickup

## 2011-12-31 ENCOUNTER — Ambulatory Visit: Payer: Self-pay | Admitting: Family Medicine

## 2012-01-31 ENCOUNTER — Encounter: Payer: Self-pay | Admitting: Family Medicine

## 2012-01-31 ENCOUNTER — Ambulatory Visit (INDEPENDENT_AMBULATORY_CARE_PROVIDER_SITE_OTHER): Payer: Self-pay | Admitting: Family Medicine

## 2012-01-31 VITALS — BP 119/70 | HR 84 | Ht 63.0 in | Wt 242.0 lb

## 2012-01-31 DIAGNOSIS — I10 Essential (primary) hypertension: Secondary | ICD-10-CM

## 2012-01-31 DIAGNOSIS — Z0000 Encounter for general adult medical examination without abnormal findings: Secondary | ICD-10-CM

## 2012-01-31 MED ORDER — ATENOLOL 50 MG PO TABS: 50.0000 mg | ORAL_TABLET | Freq: Two times a day (BID) | ORAL | Status: DC

## 2012-01-31 MED ORDER — HYDROCHLOROTHIAZIDE 25 MG PO TABS: 50.0000 mg | ORAL_TABLET | Freq: Every day | ORAL | Status: DC

## 2012-01-31 NOTE — Assessment & Plan Note (Addendum)
BP looks good, continue current medications.

## 2012-01-31 NOTE — Progress Notes (Signed)
  Subjective:    Carmen ID: Carmen [name], female    DOB: [birth year], 51 y.o.   MRN: [id number]  HPI Carmen [name] in today for annual exam. Her last Pap smear was in 2012 and was normal third Pap in a row. She is without specific complaints today. She's been seen recently for chronic otitis externa. She went to the ENT who gave her some treatment and seems to be improving. She continues to have problems with first the shoulders bilaterally. She continues to take her HCTZ and Tenormin for blood pressure control blood pressure is excellent today.    Review of Systems  Constitutional: Negative for fever and chills.  HENT: Negative for nosebleeds, congestion and rhinorrhea.   Eyes: Negative for visual disturbance.  Respiratory: Negative for chest tightness and shortness of breath.   Cardiovascular: Negative for chest pain.  Gastrointestinal: Negative for nausea, vomiting, abdominal pain, diarrhea, constipation, blood in stool and abdominal distention.  Genitourinary: Negative for dysuria, frequency, vaginal bleeding and menstrual problem.  Musculoskeletal: Negative for arthralgias.  Skin: Negative for rash.  Neurological: Negative for dizziness and headaches.  Psychiatric/Behavioral: Negative for behavioral problems, disturbed wake/sleep cycle and dysphoric mood.  All other systems reviewed and are negative.       Objective:   Physical Exam  Constitutional: She is oriented to person, place, and time. She appears well-developed and well-nourished. No distress.  HENT:  Head: Normocephalic and atraumatic.  Mouth/Throat: Oropharynx is clear and moist.  Eyes: EOM are normal. Pupils are equal, round, and reactive to light. No scleral icterus.  Neck: Neck supple. No thyromegaly present.  Cardiovascular: Normal rate and regular rhythm.   No murmur heard. Pulmonary/Chest: Effort normal and breath sounds normal. She has no wheezes.  Abdominal: Soft. She exhibits no distension. There is no  tenderness.  Musculoskeletal: Normal range of motion. She exhibits no edema and no tenderness.  Neurological: She is alert and oriented to person, place, and time.  Skin: Skin is warm and dry. No rash noted.  Psychiatric: She has a normal mood and affect. Her behavior is normal. Thought content normal.          Assessment & Plan:  Mammogram Does not need pap smear.

## 2012-01-31 NOTE — Patient Instructions (Addendum)
Preventive Care for Adults, Female A healthy lifestyle and preventive care can promote health and wellness. Preventive health guidelines for women include the following key practices.  A routine yearly physical is a good way to check with your caregiver about your health and preventive screening. It is a chance to share any concerns and updates on your health, and to receive a thorough exam.   Visit your dentist for a routine exam and preventive care every 6 months. Brush your teeth twice a day and floss once a day. Good oral hygiene prevents tooth decay and gum disease.   The frequency of eye exams is based on your age, health, family medical history, use of contact lenses, and other factors. Follow your caregiver's recommendations for frequency of eye exams.   Eat a healthy diet. Foods like vegetables, fruits, whole grains, low-fat dairy products, and lean protein foods contain the nutrients you need without too many calories. Decrease your intake of foods high in solid fats, added sugars, and salt. Eat the right amount of calories for you.Get information about a proper diet from your caregiver, if necessary.   Regular physical exercise is one of the most important things you can do for your health. Most adults should get at least 150 minutes of moderate-intensity exercise (any activity that increases your heart rate and causes you to sweat) each week. In addition, most adults need muscle-strengthening exercises on 2 or more days a week.   Maintain a healthy weight. The body mass index (BMI) is a screening tool to identify possible weight problems. It provides an estimate of body fat based on height and weight. Your caregiver can help determine your BMI, and can help you achieve or maintain a healthy weight.For adults 20 years and older:   A BMI below 18.5 is considered underweight.   A BMI of 18.5 to 24.9 is normal.   A BMI of 25 to 29.9 is considered overweight.   A BMI of 30 and above is  considered obese.   Maintain normal blood lipids and cholesterol levels by exercising and minimizing your intake of saturated fat. Eat a balanced diet with plenty of fruit and vegetables. Blood tests for lipids and cholesterol should begin at age 20 and be repeated every 5 years. If your lipid or cholesterol levels are high, you are over 50, or you are at high risk for heart disease, you may need your cholesterol levels checked more frequently.Ongoing high lipid and cholesterol levels should be treated with medicines if diet and exercise are not effective.   If you smoke, find out from your caregiver how to quit. If you do not use tobacco, do not start.   If you are pregnant, do not drink alcohol. If you are breastfeeding, be very cautious about drinking alcohol. If you are not pregnant and choose to drink alcohol, do not exceed 1 drink per day. One drink is considered to be 12 ounces (355 mL) of beer, 5 ounces (148 mL) of wine, or 1.5 ounces (44 mL) of liquor.   Avoid use of street drugs. Do not share needles with anyone. Ask for help if you need support or instructions about stopping the use of drugs.   High blood pressure causes heart disease and increases the risk of stroke. Your blood pressure should be checked at least every 1 to 2 years. Ongoing high blood pressure should be treated with medicines if weight loss and exercise are not effective.   If you are 55 to 50   years old, ask your caregiver if you should take aspirin to prevent strokes.   Diabetes screening involves taking a blood sample to check your fasting blood sugar level. This should be done once every 3 years, after age 45, if you are within normal weight and without risk factors for diabetes. Testing should be considered at a younger age or be carried out more frequently if you are overweight and have at least 1 risk factor for diabetes.   Breast cancer screening is essential preventive care for women. You should practice "breast  self-awareness." This means understanding the normal appearance and feel of your breasts and may include breast self-examination. Any changes detected, no matter how small, should be reported to a caregiver. Women in their 20s and 30s should have a clinical breast exam (CBE) by a caregiver as part of a regular health exam every 1 to 3 years. After age 40, women should have a CBE every year. Starting at age 40, women should consider having a mammography (breast X-ray test) every year. Women who have a family history of breast cancer should talk to their caregiver about genetic screening. Women at a high risk of breast cancer should talk to their caregivers about having magnetic resonance imaging (MRI) and a mammography every year.   The Pap test is a screening test for cervical cancer. A Pap test can show cell changes on the cervix that might become cervical cancer if left untreated. A Pap test is a procedure in which cells are obtained and examined from the lower end of the uterus (cervix).   Women should have a Pap test starting at age 21.   Between ages 21 and 29, Pap tests should be repeated every 2 years.   Beginning at age 30, you should have a Pap test every 3 years as long as the past 3 Pap tests have been normal.   Some women have medical problems that increase the chance of getting cervical cancer. Talk to your caregiver about these problems. It is especially important to talk to your caregiver if a new problem develops soon after your last Pap test. In these cases, your caregiver may recommend more frequent screening and Pap tests.   The above recommendations are the same for women who have or have not gotten the vaccine for human papillomavirus (HPV).   If you had a hysterectomy for a problem that was not cancer or a condition that could lead to cancer, then you no longer need Pap tests. Even if you no longer need a Pap test, a regular exam is a good idea to make sure no other problems are  starting.   If you are between ages 65 and 70, and you have had normal Pap tests going back 10 years, you no longer need Pap tests. Even if you no longer need a Pap test, a regular exam is a good idea to make sure no other problems are starting.   If you have had past treatment for cervical cancer or a condition that could lead to cancer, you need Pap tests and screening for cancer for at least 20 years after your treatment.   If Pap tests have been discontinued, risk factors (such as a new sexual partner) need to be reassessed to determine if screening should be resumed.   The HPV test is an additional test that may be used for cervical cancer screening. The HPV test looks for the virus that can cause the cell changes on the cervix.   The cells collected during the Pap test can be tested for HPV. The HPV test could be used to screen women aged 30 years and older, and should be used in women of any age who have unclear Pap test results. After the age of 30, women should have HPV testing at the same frequency as a Pap test.   Colorectal cancer can be detected and often prevented. Most routine colorectal cancer screening begins at the age of 50 and continues through age 75. However, your caregiver may recommend screening at an earlier age if you have risk factors for colon cancer. On a yearly basis, your caregiver may provide home test kits to check for hidden blood in the stool. Use of a small camera at the end of a tube, to directly examine the colon (sigmoidoscopy or colonoscopy), can detect the earliest forms of colorectal cancer. Talk to your caregiver about this at age 50, when routine screening begins. Direct examination of the colon should be repeated every 5 to 10 years through age 75, unless early forms of pre-cancerous polyps or small growths are found.   Hepatitis C blood testing is recommended for all people born from 1945 through 1965 and any individual with known risks for hepatitis C.    Practice safe sex. Use condoms and avoid high-risk sexual practices to reduce the spread of sexually transmitted infections (STIs). STIs include gonorrhea, chlamydia, syphilis, trichomonas, herpes, HPV, and human immunodeficiency virus (HIV). Herpes, HIV, and HPV are viral illnesses that have no cure. They can result in disability, cancer, and death. Sexually active women aged 25 and younger should be checked for chlamydia. Older women with new or multiple partners should also be tested for chlamydia. Testing for other STIs is recommended if you are sexually active and at increased risk.   Osteoporosis is a disease in which the bones lose minerals and strength with aging. This can result in serious bone fractures. The risk of osteoporosis can be identified using a bone density scan. Women ages 65 and over and women at risk for fractures or osteoporosis should discuss screening with their caregivers. Ask your caregiver whether you should take a calcium supplement or vitamin D to reduce the rate of osteoporosis.   Menopause can be associated with physical symptoms and risks. Hormone replacement therapy is available to decrease symptoms and risks. You should talk to your caregiver about whether hormone replacement therapy is right for you.   Use sunscreen with sun protection factor (SPF) of 30 or more. Apply sunscreen liberally and repeatedly throughout the day. You should seek shade when your shadow is shorter than you. Protect yourself by wearing long sleeves, pants, a wide-brimmed hat, and sunglasses year round, whenever you are outdoors.   Once a month, do a whole body skin exam, using a mirror to look at the skin on your back. Notify your caregiver of new moles, moles that have irregular borders, moles that are larger than a pencil eraser, or moles that have changed in shape or color.   Stay current with required immunizations.   Influenza. You need a dose every fall (or winter). The composition of  the flu vaccine changes each year, so being vaccinated once is not enough.   Pneumococcal polysaccharide. You need 1 to 2 doses if you smoke cigarettes or if you have certain chronic medical conditions. You need 1 dose at age 65 (or older) if you have never been vaccinated.   Tetanus, diphtheria, pertussis (Tdap, Td). Get 1 dose of   Tdap vaccine if you are younger than age 65, are over 65 and have contact with an infant, are a healthcare worker, are pregnant, or simply want to be protected from whooping cough. After that, you need a Td booster dose every 10 years. Consult your caregiver if you have not had at least 3 tetanus and diphtheria-containing shots sometime in your life or have a deep or dirty wound.   HPV. You need this vaccine if you are a woman age 26 or younger. The vaccine is given in 3 doses over 6 months.   Measles, mumps, rubella (MMR). You need at least 1 dose of MMR if you were born in 1957 or later. You may also need a second dose.   Meningococcal. If you are age 19 to 21 and a first-year college student living in a residence hall, or have one of several medical conditions, you need to get vaccinated against meningococcal disease. You may also need additional booster doses.   Zoster (shingles). If you are age 60 or older, you should get this vaccine.   Varicella (chickenpox). If you have never had chickenpox or you were vaccinated but received only 1 dose, talk to your caregiver to find out if you need this vaccine.   Hepatitis A. You need this vaccine if you have a specific risk factor for hepatitis A virus infection or you simply wish to be protected from this disease. The vaccine is usually given as 2 doses, 6 to 18 months apart.   Hepatitis B. You need this vaccine if you have a specific risk factor for hepatitis B virus infection or you simply wish to be protected from this disease. The vaccine is given in 3 doses, usually over 6 months.  Preventive Services /  Frequency Ages 19 to 39  Blood pressure check.** / Every 1 to 2 years.   Lipid and cholesterol check.** / Every 5 years beginning at age 20.   Clinical breast exam.** / Every 3 years for women in their 20s and 30s.   Pap test.** / Every 2 years from ages 21 through 29. Every 3 years starting at age 30 through age 65 or 70 with a history of 3 consecutive normal Pap tests.   HPV screening.** / Every 3 years from ages 30 through ages 65 to 70 with a history of 3 consecutive normal Pap tests.   Hepatitis C blood test.** / For any individual with known risks for hepatitis C.   Skin self-exam. / Monthly.   Influenza immunization.** / Every year.   Pneumococcal polysaccharide immunization.** / 1 to 2 doses if you smoke cigarettes or if you have certain chronic medical conditions.   Tetanus, diphtheria, pertussis (Tdap, Td) immunization. / A one-time dose of Tdap vaccine. After that, you need a Td booster dose every 10 years.   HPV immunization. / 3 doses over 6 months, if you are 26 and younger.   Measles, mumps, rubella (MMR) immunization. / You need at least 1 dose of MMR if you were born in 1957 or later. You may also need a second dose.   Meningococcal immunization. / 1 dose if you are age 19 to 21 and a first-year college student living in a residence hall, or have one of several medical conditions, you need to get vaccinated against meningococcal disease. You may also need additional booster doses.   Varicella immunization.** / Consult your caregiver.   Hepatitis A immunization.** / Consult your caregiver. 2 doses, 6 to 18 months   apart.   Hepatitis B immunization.** / Consult your caregiver. 3 doses usually over 6 months.  Ages 40 to 64  Blood pressure check.** / Every 1 to 2 years.   Lipid and cholesterol check.** / Every 5 years beginning at age 20.   Clinical breast exam.** / Every year after age 40.   Mammogram.** / Every year beginning at age 40 and continuing for as  long as you are in good health. Consult with your caregiver.   Pap test.** / Every 3 years starting at age 30 through age 65 or 70 with a history of 3 consecutive normal Pap tests.   HPV screening.** / Every 3 years from ages 30 through ages 65 to 70 with a history of 3 consecutive normal Pap tests.   Fecal occult blood test (FOBT) of stool. / Every year beginning at age 50 and continuing until age 75. You may not need to do this test if you get a colonoscopy every 10 years.   Flexible sigmoidoscopy or colonoscopy.** / Every 5 years for a flexible sigmoidoscopy or every 10 years for a colonoscopy beginning at age 50 and continuing until age 75.   Hepatitis C blood test.** / For all people born from 1945 through 1965 and any individual with known risks for hepatitis C.   Skin self-exam. / Monthly.   Influenza immunization.** / Every year.   Pneumococcal polysaccharide immunization.** / 1 to 2 doses if you smoke cigarettes or if you have certain chronic medical conditions.   Tetanus, diphtheria, pertussis (Tdap, Td) immunization.** / A one-time dose of Tdap vaccine. After that, you need a Td booster dose every 10 years.   Measles, mumps, rubella (MMR) immunization. / You need at least 1 dose of MMR if you were born in 1957 or later. You may also need a second dose.   Varicella immunization.** / Consult your caregiver.   Meningococcal immunization.** / Consult your caregiver.   Hepatitis A immunization.** / Consult your caregiver. 2 doses, 6 to 18 months apart.   Hepatitis B immunization.** / Consult your caregiver. 3 doses, usually over 6 months.  Ages 65 and over  Blood pressure check.** / Every 1 to 2 years.   Lipid and cholesterol check.** / Every 5 years beginning at age 20.   Clinical breast exam.** / Every year after age 40.   Mammogram.** / Every year beginning at age 40 and continuing for as long as you are in good health. Consult with your caregiver.   Pap test.** /  Every 3 years starting at age 30 through age 65 or 70 with a 3 consecutive normal Pap tests. Testing can be stopped between 65 and 70 with 3 consecutive normal Pap tests and no abnormal Pap or HPV tests in the past 10 years.   HPV screening.** / Every 3 years from ages 30 through ages 65 or 70 with a history of 3 consecutive normal Pap tests. Testing can be stopped between 65 and 70 with 3 consecutive normal Pap tests and no abnormal Pap or HPV tests in the past 10 years.   Fecal occult blood test (FOBT) of stool. / Every year beginning at age 50 and continuing until age 75. You may not need to do this test if you get a colonoscopy every 10 years.   Flexible sigmoidoscopy or colonoscopy.** / Every 5 years for a flexible sigmoidoscopy or every 10 years for a colonoscopy beginning at age 50 and continuing until age 75.   Hepatitis   C blood test.** / For all people born from 1945 through 1965 and any individual with known risks for hepatitis C.   Osteoporosis screening.** / A one-time screening for women ages 65 and over and women at risk for fractures or osteoporosis.   Skin self-exam. / Monthly.   Influenza immunization.** / Every year.   Pneumococcal polysaccharide immunization.** / 1 dose at age 65 (or older) if you have never been vaccinated.   Tetanus, diphtheria, pertussis (Tdap, Td) immunization. / A one-time dose of Tdap vaccine if you are over 65 and have contact with an infant, are a healthcare worker, or simply want to be protected from whooping cough. After that, you need a Td booster dose every 10 years.   Varicella immunization.** / Consult your caregiver.   Meningococcal immunization.** / Consult your caregiver.   Hepatitis A immunization.** / Consult your caregiver. 2 doses, 6 to 18 months apart.   Hepatitis B immunization.** / Check with your caregiver. 3 doses, usually over 6 months.  ** Family history and personal history of risk and conditions may change your caregiver's  recommendations. Document Released: 07/20/2001 Document Revised: 05/13/2011 Document Reviewed: 10/19/2010 [employer] Patient Information 2012 [employer].Hypertension As your heart beats, it forces blood through your arteries. This force is your blood pressure. If the pressure is too high, it is called hypertension (HTN) or high blood pressure. HTN is dangerous because you may have it and not know it. High blood pressure may mean that your heart has to work harder to pump blood. Your arteries may be narrow or stiff. The extra work puts you at risk for heart disease, stroke, and other problems.  Blood pressure consists of two numbers, a higher number over a lower, 110/72, for example. It is stated as "110 over 72." The ideal is below 120 for the top number (systolic) and under 80 for the bottom (diastolic). Write down your blood pressure today. You should pay close attention to your blood pressure if you have certain conditions such as:  Heart failure.   Prior heart attack.   Diabetes   Chronic kidney disease.   Prior stroke.   Multiple risk factors for heart disease.  To see if you have HTN, your blood pressure should be measured while you are seated with your arm held at the level of the heart. It should be measured at least twice. A one-time elevated blood pressure reading (especially in the Emergency Department) does not mean that you need treatment. There may be conditions in which the blood pressure is different between your right and left arms. It is important to see your caregiver soon for a recheck. Most people have essential hypertension which means that there is not a specific cause. This type of high blood pressure may be lowered by changing lifestyle factors such as:  Stress.   Smoking.   Lack of exercise.   Excessive weight.   Drug/tobacco/alcohol use.   Eating less salt.  Most people do not have symptoms from high blood pressure until it has caused damage to the body.  Effective treatment can often prevent, delay or reduce that damage. TREATMENT  When a cause has been identified, treatment for high blood pressure is directed at the cause. There are a large number of medications to treat HTN. These fall into several categories, and your caregiver will help you select the medicines that are best for you. Medications may have side effects. You should review side effects with your caregiver. If your blood pressure   stays high after you have made lifestyle changes or started on medicines,   Your medication(s) may need to be changed.   Other problems may need to be addressed.   Be certain you understand your prescriptions, and know how and when to take your medicine.   Be sure to follow up with your caregiver within the time frame advised (usually within two weeks) to have your blood pressure rechecked and to review your medications.   If you are taking more than one medicine to lower your blood pressure, make sure you know how and at what times they should be taken. Taking two medicines at the same time can result in blood pressure that is too low.  SEEK IMMEDIATE MEDICAL CARE IF:  You develop a severe headache, blurred or changing vision, or confusion.   You have unusual weakness or numbness, or a faint feeling.   You have severe chest or abdominal pain, vomiting, or breathing problems.  MAKE SURE YOU:   Understand these instructions.   Will watch your condition.   Will get help right away if you are not doing well or get worse.  Document Released: 05/24/2005 Document Revised: 05/13/2011 Document Reviewed: 01/12/2008 [employer] Patient Information 2012 [employer]. 

## 2012-02-01 LAB — COMPREHENSIVE METABOLIC PANEL
ALT: 23 U/L (ref 0–35)
AST: 18 U/L (ref 0–37)
CO2: 29 mEq/L (ref 19–32)
Calcium: 9.2 mg/dL (ref 8.4–10.5)
Chloride: 98 mEq/L (ref 96–112)
Potassium: 3.2 mEq/L — ABNORMAL LOW (ref 3.5–5.3)
Sodium: 139 mEq/L (ref 135–145)
Total Protein: 6.9 g/dL (ref 6.0–8.3)

## 2012-02-02 ENCOUNTER — Encounter: Payer: Self-pay | Admitting: Family Medicine

## 2012-06-14 ENCOUNTER — Encounter: Payer: Self-pay | Admitting: Family Medicine

## 2012-06-14 ENCOUNTER — Ambulatory Visit (INDEPENDENT_AMBULATORY_CARE_PROVIDER_SITE_OTHER): Payer: Self-pay | Admitting: Family Medicine

## 2012-06-14 VITALS — BP 138/80 | HR 64 | Temp 98.3°F | Ht 63.0 in | Wt 240.0 lb

## 2012-06-14 DIAGNOSIS — I10 Essential (primary) hypertension: Secondary | ICD-10-CM

## 2012-06-14 DIAGNOSIS — M7500 Adhesive capsulitis of unspecified shoulder: Secondary | ICD-10-CM

## 2012-06-14 DIAGNOSIS — J309 Allergic rhinitis, unspecified: Secondary | ICD-10-CM

## 2012-06-14 DIAGNOSIS — E669 Obesity, unspecified: Secondary | ICD-10-CM

## 2012-06-14 MED ORDER — HYDROCHLOROTHIAZIDE 25 MG PO TABS: 50.0000 mg | ORAL_TABLET | Freq: Every day | ORAL | Status: DC

## 2012-06-14 MED ORDER — ATENOLOL 50 MG PO TABS: 50.0000 mg | ORAL_TABLET | Freq: Two times a day (BID) | ORAL | Status: DC

## 2012-06-14 MED ORDER — FLUTICASONE PROPIONATE 50 MCG/ACT NA SUSP: 2.0000 | Freq: Every day | NASAL | Status: DC

## 2012-06-14 NOTE — Assessment & Plan Note (Signed)
BP well controlled; continue current regimen.

## 2012-06-14 NOTE — Progress Notes (Signed)
  Subjective:    Patient ID: Carmen [name], female    DOB: 08/16/1960, 52 y.o.   MRN: [id number]  HPI Returns for f/u.  Has no issues except needs medication refill.  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  She is not at all interested in mammography and flatly declines that or flu shot.  She also, cannot afford colon cancer screening.   Review of Systems  Constitutional: Negative for chills and fatigue.  HENT: Positive for congestion and rhinorrhea.   Respiratory: Negative for shortness of breath.   Cardiovascular: Negative for chest pain.  Gastrointestinal: Negative for abdominal pain.       Objective:   Physical Exam  Vitals reviewed. Constitutional: She appears well-developed and well-nourished.  HENT:  Head: Normocephalic and atraumatic.  Right Ear: Tympanic membrane and ear canal normal.  Left Ear: Tympanic membrane and ear canal normal.  Cardiovascular: Normal rate and regular rhythm.   No murmur heard. Pulmonary/Chest: Effort normal. No respiratory distress.  Abdominal: Soft. There is no tenderness.          Assessment & Plan:

## 2012-06-14 NOTE — Assessment & Plan Note (Signed)
Info given on weight loss

## 2012-06-14 NOTE — Assessment & Plan Note (Signed)
Refilled Flonase for now--taking anti-histamine daily for URI

## 2012-06-14 NOTE — Assessment & Plan Note (Signed)
Continue at home exercises to improve ROM.  Ibuprofen for pain.

## 2012-06-14 NOTE — Patient Instructions (Addendum)
Exercise to Lose Weight Exercise and a healthy diet may help you lose weight. Your doctor may suggest specific exercises. EXERCISE IDEAS AND TIPS  Choose low-cost things you enjoy doing, such as walking, bicycling, or exercising to workout videos.  Take stairs instead of the elevator.  Walk during your lunch break.  Park your car further away from work or school.  Go to a gym or an exercise class.  Start with 5 to 10 minutes of exercise each day. Build up to 30 minutes of exercise 4 to 6 days a week.  Wear shoes with good support and comfortable clothes.  Stretch before and after working out.  Work out until you breathe harder and your heart beats faster.  Drink extra water when you exercise.  Do not do so much that you hurt yourself, feel dizzy, or get very short of breath. Exercises that burn about 150 calories:  Running 1  miles in 15 minutes.  Playing volleyball for 45 to 60 minutes.  Washing and waxing a car for 45 to 60 minutes.  Playing touch football for 45 minutes.  Walking 1  miles in 35 minutes.  Pushing a stroller 1  miles in 30 minutes.  Playing basketball for 30 minutes.  Raking leaves for 30 minutes.  Bicycling 5 miles in 30 minutes.  Walking 2 miles in 30 minutes.  Dancing for 30 minutes.  Shoveling snow for 15 minutes.  Swimming laps for 20 minutes.  Walking up stairs for 15 minutes.  Bicycling 4 miles in 15 minutes.  Gardening for 30 to 45 minutes.  Jumping rope for 15 minutes.  Washing windows or floors for 45 to 60 minutes. Document Released: 06/26/2010 Document Revised: 08/16/2011 Document Reviewed: 06/26/2010 [employer] Patient Information 2013 [employer]. Calorie Counting Diet A calorie counting diet requires you to eat the number of calories that are right for you in a day. Calories are the measurement of how much energy you get from the food you eat. Eating the right amount of calories is important for staying at a  healthy weight. If you eat too many calories, your body will store them as fat and you may gain weight. If you eat too few calories, you may lose weight. Counting the number of calories you eat during a day will help you know if you are eating the right amount. A Registered Dietitian can determine how many calories you need in a day. The amount of calories needed varies from person to person. If your goal is to lose weight, you will need to eat fewer calories. Losing weight can benefit you if you are overweight or have health problems such as heart disease, high blood pressure, or diabetes. If your goal is to gain weight, you will need to eat more calories. Gaining weight may be necessary if you have a certain health problem that causes your body to need more energy. TIPS Whether you are increasing or decreasing the number of calories you eat during a day, it may be hard to get used to changes in what you eat and drink. The following are tips to help you keep track of the number of calories you eat.  Measure foods at home with measuring cups. This helps you know the amount of food and number of calories you are eating.  Restaurants often serve food in amounts that are larger than 1 serving. While eating out, estimate how many servings of a food you are given. For example, a serving of cooked rice   is  cup or about the size of half of a fist. Knowing serving sizes will help you be aware of how much food you are eating at restaurants.  Ask for smaller portion sizes or child-size portions at restaurants.  Plan to eat half of a meal at a restaurant. Take the rest home or share the other half with a friend.  Read the Nutrition Facts panel on food labels for calorie content and serving size. You can find out how many servings are in a package, the size of a serving, and the number of calories each serving has.  For example, a package might contain 3 cookies. The Nutrition Facts panel on that package says  that 1 serving is 1 cookie. Below that, it will say there are 3 servings in the container. The calories section of the Nutrition Facts label says there are 90 calories. This means there are 90 calories in 1 cookie (1 serving). If you eat 1 cookie you have eaten 90 calories. If you eat all 3 cookies, you have eaten 270 calories (3 servings x 90 calories = 270 calories). The list below tells you how big or small some common portion sizes are.  1 oz.........4 stacked dice.  3 oz.........Deck of cards.  1 tsp........Tip of little finger.  1 tbs........Thumb.  2 tbs........Golf ball.   cup.......Half of a fist.  1 cup........A fist. KEEP A FOOD LOG Write down every food item you eat, the amount you eat, and the number of calories in each food you eat during the day. At the end of the day, you can add up the total number of calories you have eaten. It may help to keep a list like the one below. Find out the calorie information by reading the Nutrition Facts panel on food labels. Breakfast  Bran cereal (1 cup, 110 calories).  Fat-free milk ( cup, 45 calories). Snack  Apple (1 medium, 80 calories). Lunch  Spinach (1 cup, 20 calories).  Tomato ( medium, 20 calories).  Chicken breast strips (3 oz, 165 calories).  Shredded cheddar cheese ( cup, 110 calories).  Light Italian dressing (2 tbs, 60 calories).  Whole-wheat bread (1 slice, 80 calories).  Tub margarine (1 tsp, 35 calories).  Vegetable soup (1 cup, 160 calories). Dinner  Pork chop (3 oz, 190 calories).  Brown rice (1 cup, 215 calories).  Steamed broccoli ( cup, 20 calories).  Strawberries (1  cup, 65 calories).  Whipped cream (1 tbs, 50 calories). Daily Calorie Total: 1425 Document Released: 05/24/2005 Document Revised: 08/16/2011 Document Reviewed: 11/18/2006 [employer] Patient Information 2013 [employer].  

## 2012-07-06 ENCOUNTER — Other Ambulatory Visit: Payer: Self-pay | Admitting: *Deleted

## 2012-07-06 DIAGNOSIS — I10 Essential (primary) hypertension: Secondary | ICD-10-CM

## 2012-07-06 MED ORDER — HYDROCHLOROTHIAZIDE 25 MG PO TABS: 50.0000 mg | ORAL_TABLET | Freq: Every day | ORAL | Status: DC

## 2012-07-18 ENCOUNTER — Ambulatory Visit: Payer: Self-pay | Admitting: Family Medicine

## 2012-07-26 ENCOUNTER — Encounter: Payer: Self-pay | Admitting: Family Medicine

## 2012-07-26 ENCOUNTER — Ambulatory Visit (INDEPENDENT_AMBULATORY_CARE_PROVIDER_SITE_OTHER): Payer: Self-pay | Admitting: Family Medicine

## 2012-07-26 VITALS — BP 153/95 | HR 76 | Temp 98.4°F | Ht 62.0 in | Wt 237.0 lb

## 2012-07-26 DIAGNOSIS — J069 Acute upper respiratory infection, unspecified: Secondary | ICD-10-CM | POA: Insufficient documentation

## 2012-07-26 DIAGNOSIS — L02222 Furuncle of back [any part, except buttock]: Secondary | ICD-10-CM | POA: Insufficient documentation

## 2012-07-26 DIAGNOSIS — L723 Sebaceous cyst: Secondary | ICD-10-CM

## 2012-07-26 DIAGNOSIS — H6240 Otitis externa in other diseases classified elsewhere, unspecified ear: Secondary | ICD-10-CM

## 2012-07-26 DIAGNOSIS — B368 Other specified superficial mycoses: Secondary | ICD-10-CM

## 2012-07-26 DIAGNOSIS — I10 Essential (primary) hypertension: Secondary | ICD-10-CM

## 2012-07-26 DIAGNOSIS — L089 Local infection of the skin and subcutaneous tissue, unspecified: Secondary | ICD-10-CM

## 2012-07-26 MED ORDER — HYDROCHLOROTHIAZIDE 25 MG PO TABS: 50.0000 mg | ORAL_TABLET | Freq: Every day | ORAL | Status: DC

## 2012-07-26 MED ORDER — ATENOLOL 50 MG PO TABS: 50.0000 mg | ORAL_TABLET | Freq: Two times a day (BID) | ORAL | Status: DC

## 2012-07-26 NOTE — Assessment & Plan Note (Signed)
Sebaceous material only expressed.  No inflammation, or signs of infection.  Return for removal.

## 2012-07-26 NOTE — Progress Notes (Signed)
  Subjective:    Patient ID: Carmen [name], female    DOB: 1960/11/27, 52 y.o.   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  She is using sweet oil, which is helping.  Has not noted any drainage.  Rash This is a new problem. The current episode started in the past 7 days. The problem has been gradually improving since onset. The affected locations include the back. The rash is characterized by pain and redness. She was exposed to nothing. Associated symptoms include congestion and a sore throat. Pertinent negatives include no shortness of breath. (Previous I and D of area)  URI  This is a new problem. The current episode started in the past 7 days. There has been no fever. Associated symptoms include congestion, a rash and a sore throat. Pertinent negatives include no abdominal pain. Associated symptoms comments: Eye drainage, dry mouth. She has tried antihistamine and decongestant (biotine) for the symptoms. The treatment provided no relief.      Review of Systems  HENT: Positive for congestion and sore throat.   Respiratory: Negative for shortness of breath.   Cardiovascular: Negative for leg swelling.  Gastrointestinal: Negative for abdominal pain.  Skin: Positive for rash.       Objective:   Physical Exam  Vitals reviewed. Constitutional: She appears well-developed and well-nourished.  HENT:  Head: Normocephalic and atraumatic.  Right Ear: Tympanic membrane and ear canal normal.  Left Ear: Tympanic membrane and ear canal normal.  Nose: Mucosal edema and rhinorrhea present. Right sinus exhibits no maxillary sinus tenderness and no frontal sinus tenderness. Left sinus exhibits no maxillary sinus tenderness and no frontal sinus tenderness.  Mouth/Throat: Oropharynx is clear and moist.  Eyes: No scleral icterus.  Skin: Skin is warm and dry. No ecchymosis and no petechiae noted.             Assessment & Plan:

## 2012-07-26 NOTE — Assessment & Plan Note (Signed)
Changing pharmacies, so new rx given.

## 2012-07-26 NOTE — Assessment & Plan Note (Addendum)
Continue using sweet oil that helps remove wax

## 2012-07-26 NOTE — Assessment & Plan Note (Addendum)
Trial of Flonase or Nasacort if no improvement.

## 2012-07-26 NOTE — Patient Instructions (Addendum)
Upper Respiratory Infection, Adult An upper respiratory infection (URI) is also known as the common cold. It is often caused by a type of germ (virus). Colds are easily spread (contagious). You can pass it to others by kissing, coughing, sneezing, or drinking out of the same glass. Usually, you get better in 1 or 2 weeks.  HOME CARE   Only take medicine as told by your doctor.  Use a warm mist humidifier or breathe in steam from a hot shower.  Drink enough water and fluids to keep your pee (urine) clear or pale yellow.  Get plenty of rest.  Return to work when your temperature is back to normal or as told by your doctor. You may use a face mask and wash your hands to stop your cold from spreading. GET HELP RIGHT AWAY IF:   After the first few days, you feel you are getting worse.  You have questions about your medicine.  You have chills, shortness of breath, or brown or red spit (mucus).  You have yellow or brown snot (nasal discharge) or pain in the face, especially when you bend forward.  You have a fever, puffy (swollen) neck, pain when you swallow, or white spots in the back of your throat.  You have a bad headache, ear pain, sinus pain, or chest pain.  You have a high-pitched whistling sound when you breathe in and out (wheezing).  You have a lasting cough or cough up blood.  You have sore muscles or a stiff neck. MAKE SURE YOU:   Understand these instructions.  Will watch your condition.  Will get help right away if you are not doing well or get worse. Document Released: 11/10/2007 Document Revised: 08/16/2011 Document Reviewed: 09/28/2010 [facility] Patient Information 2013 [place], [place]. Epidermal Cyst An epidermal cyst is sometimes called a sebaceous cyst, epidermal inclusion cyst, or infundibular cyst. These cysts usually contain a substance that looks "pasty" or "cheesy" and may have a bad smell. This substance is a protein called keratin. Epidermal cysts are  usually found on the face, neck, or trunk. They may also occur in the vaginal area or other parts of the genitalia of both men and women. Epidermal cysts are usually small, painless, slow-growing bumps or lumps that move freely under the skin. It is important not to try to pop them. This may cause an infection and lead to tenderness and swelling. CAUSES  Epidermal cysts may be caused by a deep penetrating injury to the skin or a plugged hair follicle, often associated with acne. SYMPTOMS  Epidermal cysts can become inflamed and cause:  Redness.  Tenderness.  Increased temperature of the skin over the bumps or lumps.  Grayish-white, bad smelling material that drains from the bump or lump. DIAGNOSIS  Epidermal cysts are easily diagnosed by your caregiver during an exam. Rarely, a tissue sample (biopsy) may be taken to rule out other conditions that may resemble epidermal cysts. TREATMENT   Epidermal cysts often get better and disappear on their own. They are rarely ever cancerous.  If a cyst becomes infected, it may become inflamed and tender. This may require opening and draining the cyst. Treatment with antibiotics may be necessary. When the infection is gone, the cyst may be removed with minor surgery.  Small, inflamed cysts can often be treated with antibiotics or by injecting steroid medicines.  Sometimes, epidermal cysts become large and bothersome. If this happens, surgical removal in your caregiver's office may be necessary. HOME CARE INSTRUCTIONS  Only  take over-the-counter or prescription medicines as directed by your caregiver.  Take your antibiotics as directed. Finish them even if you start to feel better. SEEK MEDICAL CARE IF:   Your cyst becomes tender, red, or swollen.  Your condition is not improving or is getting worse.  You have any other questions or concerns. MAKE SURE YOU:  Understand these instructions.  Will watch your condition.  Will get help right  away if you are not doing well or get worse. Document Released: 04/24/2004 Document Revised: 08/16/2011 Document Reviewed: 11/30/2010 [facility] Patient Information 2013 [place], [place].

## 2012-08-14 ENCOUNTER — Ambulatory Visit (INDEPENDENT_AMBULATORY_CARE_PROVIDER_SITE_OTHER): Payer: No Typology Code available for payment source | Admitting: Family Medicine

## 2012-08-14 ENCOUNTER — Encounter: Payer: Self-pay | Admitting: Family Medicine

## 2012-08-14 VITALS — BP 139/87 | HR 73 | Temp 98.6°F | Ht 63.0 in | Wt 240.0 lb

## 2012-08-14 DIAGNOSIS — B369 Superficial mycosis, unspecified: Secondary | ICD-10-CM

## 2012-08-14 DIAGNOSIS — L723 Sebaceous cyst: Secondary | ICD-10-CM

## 2012-08-14 DIAGNOSIS — H6240 Otitis externa in other diseases classified elsewhere, unspecified ear: Secondary | ICD-10-CM

## 2012-08-14 MED ORDER — OXYCODONE-ACETAMINOPHEN 5-325 MG PO TABS: 1.0000 | ORAL_TABLET | Freq: Three times a day (TID) | ORAL | Status: DC | PRN

## 2012-08-14 NOTE — Assessment & Plan Note (Signed)
S/p I and D today--continue packing.  Return for re-assessment in 4-6 wks

## 2012-08-14 NOTE — Progress Notes (Signed)
  Subjective:    Patient ID: Carmen [name], female    DOB: 1960/10/05, 52 y.o.   MRN: [id number]  HPI  Here today with cyst on back.  Has repeatedly had inflammation and infection.  Also reports itching and dryness in right ear.  Recurrent problem, using sweet oil.  Minimal effect.  Review of Systems  Constitutional: Negative for fever and chills.  Respiratory: Negative for chest tightness.   Gastrointestinal: Negative for abdominal pain.  Genitourinary: Negative for dysuria and vaginal bleeding.       Objective:   Physical Exam  Vitals reviewed. Constitutional: She is oriented to person, place, and time. She appears well-developed and well-nourished.  HENT:  Head: Normocephalic and atraumatic.  Eyes: No scleral icterus.  Neck: Neck supple.  Cardiovascular: Normal rate.   Pulmonary/Chest: Effort normal.  Abdominal: Soft.  Musculoskeletal: Normal range of motion.  Neurological: She is alert and oriented to person, place, and time.  Skin: Skin is warm and dry.   Procedure: Prepped  With Lidocaine and epinephrine.  Incised and debrided and cyst wall removed.  Incision packed.       Assessment & Plan:

## 2012-08-14 NOTE — Assessment & Plan Note (Signed)
May resume ear drops as needed.

## 2012-08-14 NOTE — Patient Instructions (Signed)

## 2012-08-15 ENCOUNTER — Telehealth: Payer: Self-pay | Admitting: Family Medicine

## 2012-08-15 NOTE — Telephone Encounter (Signed)
Pt had an incision done yesterday and was told to get idoform to pack it with.  Cannot find that anywhere and wants to know if she can just use regular gauze.

## 2012-08-15 NOTE — Telephone Encounter (Signed)
Pt called back and said that she found it - is getting from pharmacy

## 2012-08-21 ENCOUNTER — Encounter: Payer: Self-pay | Admitting: Family Medicine

## 2012-08-21 ENCOUNTER — Ambulatory Visit (INDEPENDENT_AMBULATORY_CARE_PROVIDER_SITE_OTHER): Payer: No Typology Code available for payment source | Admitting: Family Medicine

## 2012-08-21 VITALS — BP 152/98 | Temp 98.2°F | Ht 63.0 in | Wt 239.9 lb

## 2012-08-21 DIAGNOSIS — B369 Superficial mycosis, unspecified: Secondary | ICD-10-CM

## 2012-08-21 DIAGNOSIS — B368 Other specified superficial mycoses: Secondary | ICD-10-CM

## 2012-08-21 MED ORDER — NEOMYCIN-POLYMYXIN-HC 1 % OT SOLN: 4.0000 [drp] | Freq: Four times a day (QID) | OTIC | Status: DC

## 2012-08-21 NOTE — Progress Notes (Signed)
  Subjective:    Patient ID: Carmen [name], female    DOB: October 04, 1960, 52 y.o.   MRN: [id number]  Otalgia  There is pain in the right ear. This is a recurrent problem. The current episode started in the past 7 days. The problem occurs constantly. The problem has been gradually improving. There has been no fever. The pain is moderate. Pertinent negatives include no abdominal pain. She has tried ear drops (sweet oil) for the symptoms. The treatment provided mild relief. Her past medical history is significant for a chronic ear infection.      Review of Systems  HENT: Positive for ear pain. Negative for dental problem.   Respiratory: Negative for shortness of breath.   Cardiovascular: Negative for chest pain.  Gastrointestinal: Negative for abdominal pain.       Objective:   Physical Exam  Vitals reviewed. Constitutional: She appears well-developed and well-nourished.  HENT:  Head: Normocephalic and atraumatic.  Right Ear: There is tenderness.  Left Ear: Tympanic membrane and ear canal normal.  White debris in ear canal  Eyes: No scleral icterus.  Neck: Neck supple.  Cardiovascular: Normal rate.   Pulmonary/Chest: Effort normal.  Abdominal: Soft.  Skin:  Prev. Sebaceous cyst site inspected and looks good.  Packed with 1/2 in iodoform and covered with 4x4.          Assessment & Plan:

## 2012-08-21 NOTE — Assessment & Plan Note (Addendum)
Recurrence recently, mild improvement with polymixin.  Has had this previously and responded well to some powder by ENT--advised to return to ENT.

## 2012-08-21 NOTE — Patient Instructions (Signed)
Otomycosis Otomycosis is a fungus infection of the ear canal. This may cause an earache, hearing loss, and large amounts of debris to accumulate in the ear canal. Otomycosis is more common in humid climates and is usually caused by candida or aspegillus. External ear infections that do not respond to antibiotic ear drops may be due to a fungus. The most common symptom or complaint is itching or itching deep in the ear canal. Unless your eardrum has a hole in it, anti-fungal medicines used on the skin can be used 2 to 3 times daily in the ear canal after the debris is cleaned out thoroughly. Two weeks of treatment are usually needed, but careful follow-up is recommended. Fungus infections can be difficult to cure. Except for your ear drops, the ear canal must be kept dry until the infection is cured. Do not go swimming or use ear plugs. Protect your ear canal with a cotton ball covered with petroleum jelly while showering. Call your caregiver if you are not better after several days of treatment.  SEEK IMMEDIATE MEDICAL CARE IF: You develop severe pain, increased hearing loss, dizziness, vomiting, a high fever, or other serious symptoms. Document Released: 07/01/2004 Document Revised: 08/16/2011 Document Reviewed: 09/18/2008 [facility] Patient Information 2013 [place], [place].

## 2012-11-15 ENCOUNTER — Ambulatory Visit (INDEPENDENT_AMBULATORY_CARE_PROVIDER_SITE_OTHER): Payer: No Typology Code available for payment source | Admitting: Family Medicine

## 2012-11-15 ENCOUNTER — Telehealth: Payer: Self-pay | Admitting: Family Medicine

## 2012-11-15 ENCOUNTER — Encounter: Payer: Self-pay | Admitting: Family Medicine

## 2012-11-15 VITALS — BP 149/89 | HR 68 | Temp 98.3°F | Ht 62.0 in | Wt 225.0 lb

## 2012-11-15 DIAGNOSIS — L739 Follicular disorder, unspecified: Secondary | ICD-10-CM | POA: Insufficient documentation

## 2012-11-15 DIAGNOSIS — E119 Type 2 diabetes mellitus without complications: Secondary | ICD-10-CM

## 2012-11-15 DIAGNOSIS — B354 Tinea corporis: Secondary | ICD-10-CM

## 2012-11-15 DIAGNOSIS — E1149 Type 2 diabetes mellitus with other diabetic neurological complication: Secondary | ICD-10-CM | POA: Insufficient documentation

## 2012-11-15 DIAGNOSIS — K589 Irritable bowel syndrome without diarrhea: Secondary | ICD-10-CM

## 2012-11-15 DIAGNOSIS — E876 Hypokalemia: Secondary | ICD-10-CM

## 2012-11-15 DIAGNOSIS — I10 Essential (primary) hypertension: Secondary | ICD-10-CM

## 2012-11-15 DIAGNOSIS — R631 Polydipsia: Secondary | ICD-10-CM

## 2012-11-15 DIAGNOSIS — L738 Other specified follicular disorders: Secondary | ICD-10-CM

## 2012-11-15 DIAGNOSIS — B368 Other specified superficial mycoses: Secondary | ICD-10-CM

## 2012-11-15 DIAGNOSIS — R5383 Other fatigue: Secondary | ICD-10-CM

## 2012-11-15 DIAGNOSIS — B373 Candidiasis of vulva and vagina: Secondary | ICD-10-CM

## 2012-11-15 DIAGNOSIS — B369 Superficial mycosis, unspecified: Secondary | ICD-10-CM

## 2012-11-15 DIAGNOSIS — R5381 Other malaise: Secondary | ICD-10-CM | POA: Insufficient documentation

## 2012-11-15 LAB — COMPREHENSIVE METABOLIC PANEL
Albumin: 4 g/dL (ref 3.5–5.2)
BUN: 10 mg/dL (ref 6–23)
CO2: 29 mEq/L (ref 19–32)
Calcium: 9.1 mg/dL (ref 8.4–10.5)
Chloride: 89 mEq/L — ABNORMAL LOW (ref 96–112)
Creat: 0.74 mg/dL (ref 0.50–1.10)
Glucose, Bld: 520 mg/dL (ref 70–99)
Potassium: 2.9 mEq/L — ABNORMAL LOW (ref 3.5–5.3)

## 2012-11-15 LAB — TSH: TSH: 3.503 u[IU]/mL (ref 0.350–4.500)

## 2012-11-15 MED ORDER — MUPIROCIN 2 % EX OINT: TOPICAL_OINTMENT | Freq: Three times a day (TID) | CUTANEOUS | Status: DC

## 2012-11-15 MED ORDER — METFORMIN HCL 500 MG PO TABS: 500.0000 mg | ORAL_TABLET | Freq: Two times a day (BID) | ORAL | Status: DC

## 2012-11-15 MED ORDER — FLUCONAZOLE 150 MG PO TABS: 150.0000 mg | ORAL_TABLET | Freq: Once | ORAL | Status: DC

## 2012-11-15 NOTE — Telephone Encounter (Signed)
Reached pt. At home number.  Discussed diagnosis, need for medication, diet, BS checking, avoidance of sugars and carbs until she can get in for education with Dr. Michaelyn Barter.  F/u with me in one month.

## 2012-11-15 NOTE — Assessment & Plan Note (Signed)
Continues to be an issue, worse lately with associated polydipsia--will check TSH and r/o DM

## 2012-11-15 NOTE — Assessment & Plan Note (Signed)
Diflucan prn 

## 2012-11-15 NOTE — Assessment & Plan Note (Signed)
Bactroban sent to pharmacy.

## 2012-11-15 NOTE — Assessment & Plan Note (Signed)
Continues to be issue--to see ENT and use sweet oil prn.

## 2012-11-15 NOTE — Assessment & Plan Note (Signed)
Knows what to do and how to fix with self medication.

## 2012-11-15 NOTE — Progress Notes (Signed)
  Subjective:    Patient ID: Carmen [name], female    DOB: [birth year], 52 y.o.   MRN: [id number]  HPI Here for f/u today.  BP is ok.  She has finished school. 1. Had flare of IBD, likely related to stress.  Increased fiber and Activia which helped.  2. ? Flea bites on hands and legs.  3. Infected hair follicle.  4. ? Skin yeast under breast and in vaginal area.  5. Also having ear itching and needs to see ENT.  Using sweet oil.  6. Polyuria and polydipsia, and very dry mouth x 2 months.   Review of Systems  Constitutional: Negative for fever, chills and unexpected weight change.  Respiratory: Negative for shortness of breath.   Cardiovascular: Negative for chest pain.  Gastrointestinal: Negative for abdominal pain.  Genitourinary: Negative for dysuria and dyspareunia.       Objective:   Physical Exam  Vitals reviewed. Constitutional: She is oriented to person, place, and time. She appears well-developed and well-nourished.  HENT:  Head: Normocephalic and atraumatic.  Left Ear: There is drainage.  Mild, white with some minimal erythema down in canal.  Eyes: No scleral icterus.  Neck: Neck supple.  Cardiovascular: Normal rate.   Pulmonary/Chest: Effort normal. No respiratory distress.  Abdominal: Soft. There is no tenderness.  Musculoskeletal: She exhibits no edema.  Neurological: She is alert and oriented to person, place, and time.  Skin: Rash noted.  Few erythematous papules noted on distal extremities.          Assessment & Plan:

## 2012-11-15 NOTE — Assessment & Plan Note (Signed)
BP ok 

## 2012-11-15 NOTE — Assessment & Plan Note (Signed)
Keep area dry.  Topical treatment.

## 2012-11-15 NOTE — Patient Instructions (Addendum)
Hypertension As your heart beats, it forces blood through your arteries. This force is your blood pressure. If the pressure is too high, it is called hypertension (HTN) or high blood pressure. HTN is dangerous because you may have it and not know it. High blood pressure may mean that your heart has to work harder to pump blood. Your arteries may be narrow or stiff. The extra work puts you at risk for heart disease, stroke, and other problems.  Blood pressure consists of two numbers, a higher number over a lower, 110/72, for example. It is stated as "110 over 72." The ideal is below 120 for the top number (systolic) and under 80 for the bottom (diastolic). Write down your blood pressure today. You should pay close attention to your blood pressure if you have certain conditions such as:  Heart failure.  Prior heart attack.  Diabetes  Chronic kidney disease.  Prior stroke.  Multiple risk factors for heart disease. To see if you have HTN, your blood pressure should be measured while you are seated with your arm held at the level of the heart. It should be measured at least twice. A one-time elevated blood pressure reading (especially in the Emergency Department) does not mean that you need treatment. There may be conditions in which the blood pressure is different between your right and left arms. It is important to see your caregiver soon for a recheck. Most people have essential hypertension which means that there is not a specific cause. This type of high blood pressure may be lowered by changing lifestyle factors such as:  Stress.  Smoking.  Lack of exercise.  Excessive weight.  Drug/tobacco/alcohol use.  Eating less salt. Most people do not have symptoms from high blood pressure until it has caused damage to the body. Effective treatment can often prevent, delay or reduce that damage. TREATMENT  When a cause has been identified, treatment for high blood pressure is directed at the  cause. There are a large number of medications to treat HTN. These fall into several categories, and your caregiver will help you select the medicines that are best for you. Medications may have side effects. You should review side effects with your caregiver. If your blood pressure stays high after you have made lifestyle changes or started on medicines,   Your medication(s) may need to be changed.  Other problems may need to be addressed.  Be certain you understand your prescriptions, and know how and when to take your medicine.  Be sure to follow up with your caregiver within the time frame advised (usually within two weeks) to have your blood pressure rechecked and to review your medications.  If you are taking more than one medicine to lower your blood pressure, make sure you know how and at what times they should be taken. Taking two medicines at the same time can result in blood pressure that is too low. SEEK IMMEDIATE MEDICAL CARE IF:  You develop a severe headache, blurred or changing vision, or confusion.  You have unusual weakness or numbness, or a faint feeling.  You have severe chest or abdominal pain, vomiting, or breathing problems. MAKE SURE YOU:   Understand these instructions.  Will watch your condition.  Will get help right away if you are not doing well or get worse. Document Released: 05/24/2005 Document Revised: 08/16/2011 Document Reviewed: 01/12/2008 [facility] Patient Information 2014 [place], [place]. Folliculitis  Folliculitis is redness, soreness, and swelling (inflammation) of the hair follicles. This condition can  occur anywhere on the body. People with weakened immune systems, diabetes, or obesity have a greater risk of getting folliculitis. CAUSES  Bacterial infection. This is the most common cause.  Fungal infection.  Viral infection.  Contact with certain chemicals, especially oils and tars. Long-term folliculitis can result from bacteria that  live in the nostrils. The bacteria may trigger multiple outbreaks of folliculitis over time. SYMPTOMS Folliculitis most commonly occurs on the scalp, thighs, legs, back, buttocks, and areas where hair is shaved frequently. An early sign of folliculitis is a small, white or yellow, pus-filled, itchy lesion (pustule). These lesions appear on a red, inflamed follicle. They are usually less than 0.2 inches (5 mm) wide. When there is an infection of the follicle that goes deeper, it becomes a boil or furuncle. A group of closely packed boils creates a larger lesion (carbuncle). Carbuncles tend to occur in hairy, sweaty areas of the body. DIAGNOSIS  Your caregiver can usually tell what is wrong by doing a physical exam. A sample may be taken from one of the lesions and tested in a lab. This can help determine what is causing your folliculitis. TREATMENT  Treatment may include:  Applying warm compresses to the affected areas.  Taking antibiotic medicines orally or applying them to the skin.  Draining the lesions if they contain a large amount of pus or fluid.  Laser hair removal for cases of long-lasting folliculitis. This helps to prevent regrowth of the hair. HOME CARE INSTRUCTIONS  Apply warm compresses to the affected areas as directed by your caregiver.  If antibiotics are prescribed, take them as directed. Finish them even if you start to feel better.  You may take over-the-counter medicines to relieve itching.  Do not shave irritated skin.  Follow up with your caregiver as directed. SEEK IMMEDIATE MEDICAL CARE IF:   You have increasing redness, swelling, or pain in the affected area.  You have a fever. MAKE SURE YOU:  Understand these instructions.  Will watch your condition.  Will get help right away if you are not doing well or get worse. Document Released: 08/02/2001 Document Revised: 11/23/2011 Document Reviewed: 08/24/2011 [facility] Patient Information 2014 [place],  [place]. Fungus Infection of the Skin An infection of your skin caused by a fungus is a very common problem. Treatment depends on which part of the body is affected. Types of fungal skin infection include:  Athlete's Foot(Tinea pedis). This infection starts between the toes and may involve the entire sole and sides of foot. It is the most common fungal disease. It is made worse by heat, moisture, and friction. To treat, wash your feet 2 to 3 times daily. Dry thoroughly between the toes. Use medicated foot powder or cream as directed on the package. Plain talc, cornstarch, or rice powder may be dusted into socks and shoes to keep the feet dry. Wearing footwear that allows ventilation is also helpful.  Ringworm (Tinea corporis and tinea capitis). This infection causes scaly red rings to form on the skin or scalp. For skin sores, apply medicated lotion or cream as directed on the package. For the scalp, medicated shampoo may be used with with other therapies. Ringworm of the scalp or fingernails usually requires using oral medicine for 2 to 4 months.  Tinea versicolor. This infection appears as painless, scaly, patchy areas of discolored skin (whitish to light brown). It is more common in the summer and favors oily areas of the skin such as those found at the chest, abdomen, back,  pubis, neck, and body folds. It can be treated with medicated shampoo or with medicated topical cream. Oral antifungals may be needed for more active infections. The light and/or dark spots may take time to get better and is not a sign of treatment failure. Fungal infections may need to be treated for several weeks to be cured. It is important not to treat fungal infections with steroids or combination medicine that contains an antifungal and steroid as these will make the fungal infection worse. SEEK MEDICAL CARE IF:   You have persistent itching or rawness.  You have an oral temperature above 102 F (38.9 C). Document Released:  07/01/2004 Document Revised: 08/16/2011 Document Reviewed: 09/16/2009 [facility] Patient Information 2013 [place], [place].

## 2012-11-15 NOTE — Assessment & Plan Note (Signed)
[place]--will check HgbA1C, CMP

## 2012-11-16 ENCOUNTER — Telehealth: Payer: Self-pay | Admitting: *Deleted

## 2012-11-16 DIAGNOSIS — E876 Hypokalemia: Secondary | ICD-10-CM | POA: Insufficient documentation

## 2012-11-16 MED ORDER — POTASSIUM CHLORIDE CRYS ER 20 MEQ PO TBCR: 40.0000 meq | EXTENDED_RELEASE_TABLET | Freq: Every day | ORAL | Status: DC

## 2012-11-16 NOTE — Telephone Encounter (Signed)
Message copied by Radene Ou on Thu Nov 16, 2012  9:48 AM ------      Message from: Reva Bores      Created: Thu Nov 16, 2012  9:18 AM       Needs some potassium will send in rx to pharmacy--please inform pt. To pick it up. ------

## 2012-11-16 NOTE — Addendum Note (Signed)
Addended by: Reva Bores on: 11/16/2012 09:19 AM   Modules accepted: Orders

## 2012-11-16 NOTE — Telephone Encounter (Signed)
LMOVM for patient to tell her Dr. Shawnie Pons called in Potassium for her to start taking per MD order.  Taliah Porche, Darlyne Russian, CMA

## 2012-11-21 ENCOUNTER — Encounter: Payer: Self-pay | Admitting: Pharmacist

## 2012-11-21 ENCOUNTER — Ambulatory Visit (INDEPENDENT_AMBULATORY_CARE_PROVIDER_SITE_OTHER): Payer: No Typology Code available for payment source | Admitting: Pharmacist

## 2012-11-21 ENCOUNTER — Telehealth: Payer: Self-pay | Admitting: *Deleted

## 2012-11-21 VITALS — BP 148/81 | HR 77 | Ht 63.0 in | Wt 221.0 lb

## 2012-11-21 DIAGNOSIS — E119 Type 2 diabetes mellitus without complications: Secondary | ICD-10-CM

## 2012-11-21 MED ORDER — GLIMEPIRIDE 2 MG PO TABS: 2.0000 mg | ORAL_TABLET | Freq: Every day | ORAL | Status: DC

## 2012-11-21 MED ORDER — METFORMIN HCL 500 MG PO TABS: 500.0000 mg | ORAL_TABLET | Freq: Two times a day (BID) | ORAL | Status: DC

## 2012-11-21 NOTE — Telephone Encounter (Signed)
[facility] pharmacy called and patient showed up looking for mew DM medications that should have been sent in today.  Patient saw Dr. Raymondo Band today and will forward to Dr. Raymondo Band for verification of medications. Issai Werling, Darlyne Russian, CMA

## 2012-11-21 NOTE — Addendum Note (Signed)
Addended by: Kathrin Ruddy on: 11/21/2012 05:29 PM   Modules accepted: Orders

## 2012-11-21 NOTE — Assessment & Plan Note (Addendum)
Newly diagnosed diabetes determined by Lab Results  Component Value Date   HGBA1C 11.4 11/15/2012   Is NOT checking home cbgs.  Denies hypoglycemic events.  Discussed carbohydrate choices and how to best manage meals and snacks. Also discussed how to identify hypoglycemic events and ways to manage those. Sent prescriptions for metformin 500mg  BID WC and glimepiride 2mg  daily with food. Discussed side effects and the ways in which the medications work. Written patient instructions provided.  Follow up in Pharmacist Clinic Visit in early July. Follow up visit with Dr. Shawnie Pons also in early July.   Total time in face to face counseling 65 minutes.  Patient seen with Brigid Re, PharmD Resident.

## 2012-11-21 NOTE — Progress Notes (Signed)
  Subjective:    Patient ID: Carmen [name], female    DOB: May 26, 1961, 52 y.o.   MRN: [id number]  HPI  Carmen [name] arrives alone to clinic today in [place]. She was diagnosed with diabetes last week with an A1C of 11.4%. States she has not started to take the metformin that was prescribed for her by Carmen [name]. States she does not have health insurance and cannot afford expensive medications. Discussed with her that Carmen [name] offers free oral antidiabetic medications. She also is nervous about starting home glucose testing..States "I'd rather come here every week and have you take it from my arm!". Discussed the reasoning behind needing to test at home and told her we would revisit that at a later date once we determine how to obtain a meter for her. Of note, patient states she has lost 8 pounds in the past week. She has gotten diabetic cook books and has been using those recipes. States she does not like to exercise, and we focused on little ways she can increase her physical activity, such as walking around the block.  Review of Systems     Objective:   Physical Exam      Assessment & Plan:   Newly diagnosed diabetes determined by Lab Results  Component Value Date   HGBA1C 11.4 11/15/2012   Is NOT checking home cbgs.  Denies hypoglycemic events.  Discussed carbohydrate choices and how to best manage meals and snacks. Also discussed how to identify hypoglycemic events and ways to manage those. Sent prescriptions for metformin 500mg  BID WC and glimepiride 2mg  daily with food. Discussed side effects and the ways in which the medications work. Written patient instructions provided.  Follow up in Pharmacist Clinic Visit in early July. Follow up visit with Carmen [name] also in early July.   Total time in face to face counseling 65 minutes.  Patient seen with Brigid Re, PharmD Resident.

## 2012-11-21 NOTE — Patient Instructions (Addendum)
It was nice to meet you today.  Start taking the metformin 500mg  two times a day with food. Also start taking glimepiride 2mg  once daily before breakfast.  Follow up with Dr. Shawnie Pons in early July. Follow up with Dr. Raymondo Band also in early July.

## 2012-11-23 ENCOUNTER — Telehealth: Payer: Self-pay | Admitting: Family Medicine

## 2012-11-23 NOTE — Telephone Encounter (Signed)
Pt has questions about diabetes. Odorous urine - encouraged to drink water and if itching develops to call for appointment.Educated on diabetic diet, exercise, and healthy habits. Pt verbalized understanding. Wyatt Haste, RN-BSN

## 2012-11-23 NOTE — Telephone Encounter (Signed)
Patient has questions about DM. Would like to speak to a nurse.

## 2012-11-28 NOTE — Progress Notes (Signed)
Patient ID: Carmen [name], female   DOB: March 07, 1961, 52 y.o.   MRN: [id number] Reviewed: Agree with Dr. Macky Lower documentation and management.

## 2012-11-30 ENCOUNTER — Telehealth: Payer: Self-pay | Admitting: Family Medicine

## 2012-11-30 DIAGNOSIS — I10 Essential (primary) hypertension: Secondary | ICD-10-CM

## 2012-11-30 MED ORDER — ATENOLOL 50 MG PO TABS: 50.0000 mg | ORAL_TABLET | Freq: Two times a day (BID) | ORAL | Status: DC

## 2012-11-30 NOTE — Addendum Note (Signed)
Addended by: Reva Bores on: 11/30/2012 03:59 PM   Modules accepted: Orders

## 2012-11-30 NOTE — Telephone Encounter (Signed)
Will forward to pcp

## 2012-11-30 NOTE — Telephone Encounter (Signed)
Patient needs a refill on Atenolol sent to [facility] in [place].

## 2012-11-30 NOTE — Telephone Encounter (Signed)
Closed in error, pcp please address.

## 2012-11-30 NOTE — Telephone Encounter (Signed)
Patient would like to speak to Dr. Raymondo Band about what she should do about testing supplies and diabetic meds.  Also with Dr. Shawnie Pons not being in the office for the month of July, there is no way for her to see Dr. Shawnie Pons before seeing Dr. Raymondo Band.

## 2012-12-04 NOTE — Telephone Encounter (Signed)
Patient is calling for a refill on Atenolo 50mg . She wants Dr. Raymondo Band call her since she can not see Dr. Shawnie Pons until august. She is not sure what she she do. She received a one touch meter for free and now needs the strips. She does not have insurance. JW

## 2012-12-04 NOTE — Telephone Encounter (Signed)
TC from [facility], needing refill on Atenolol.  RF sent in 11/30/2012.  Also, pt told to contact [place] about the [employer].  She can not afford the test strips and would like to speak to Dr. Raymondo Band about options.  I instructed pt to ,again, contact Britta Mccreedy so she may have better options regarding test strips.  Pt has appt next week with Dr. Raymondo Band.  Emilie Rutter, Darlyne Russian, CMA

## 2012-12-14 ENCOUNTER — Encounter: Payer: Self-pay | Admitting: Family Medicine

## 2012-12-14 ENCOUNTER — Encounter: Payer: Self-pay | Admitting: Pharmacist

## 2012-12-14 ENCOUNTER — Ambulatory Visit (INDEPENDENT_AMBULATORY_CARE_PROVIDER_SITE_OTHER): Payer: No Typology Code available for payment source | Admitting: Pharmacist

## 2012-12-14 VITALS — BP 131/77 | HR 69 | Wt 224.5 lb

## 2012-12-14 DIAGNOSIS — E119 Type 2 diabetes mellitus without complications: Secondary | ICD-10-CM

## 2012-12-14 MED ORDER — METFORMIN HCL 1000 MG PO TABS: 1000.0000 mg | ORAL_TABLET | Freq: Two times a day (BID) | ORAL | Status: DC

## 2012-12-14 MED ORDER — GLIMEPIRIDE 4 MG PO TABS: 4.0000 mg | ORAL_TABLET | Freq: Every day | ORAL | Status: DC

## 2012-12-14 NOTE — Assessment & Plan Note (Signed)
Diabetes - poorly controlled despite tx with metformin 500mg  bid AND amaryl 2mg  daily.   Patient is reluctant to do injection Tx.  She is willing to initiate another oral agent to help her with he blood sugars.   Increased  Metformin to 1000mg  BID and Amaryl to 4mg  daily. She would like to have less expensive blood glucose test strips.   Asked her to call Britta Mccreedy and schedule her appointment.   After she gets an orange card I asked her to reschedule with me to discuss an additional therapy.   This wil likely need to be through [facility].   Total time in face-to-face counseling 30 minutes.

## 2012-12-14 NOTE — Progress Notes (Signed)
Patient ID: Carmen [name], female   DOB: 1961/03/25, 52 y.o.   MRN: [id number] Reviewed: Agree with Dr. Macky Lower documentation and management.

## 2012-12-14 NOTE — Patient Instructions (Addendum)
Continue to work hard to do your best with your diet.   We increased BOTH your medications today.     Hopefully this will help with your symptoms.

## 2012-12-14 NOTE — Progress Notes (Signed)
S:  Patient arrives in follow up.   She has NOT been able to meet with Britta Mccreedy yet RE [employer].   She complains of 2-3 times per night of nocturia - decreased from previous q30 minutes.  She complains of vision changes - blurriness.   She complains of fatigue.   O:  Unable to afford test strips.    A/P:   Diabetes - poorly controlled despite tx with metformin 500mg  bid AND amaryl 2mg  daily.   Patient is reluctant to do injection Tx.  She is willing to initiate another oral agent to help her with he blood sugars.   Increased  Metformin to 1000mg  BID and Amaryl to 4mg  daily. She would like to have less expensive blood glucose test strips.   Asked her to call Britta Mccreedy and schedule her appointment.   After she gets an orange card I asked her to reschedule with me to discuss an additional therapy.   This willlikely need to be through [facility].  Encouraged continued work with diet and exercise.  Total time in face-to-face counseling 30 minutes.

## 2012-12-20 ENCOUNTER — Telehealth: Payer: Self-pay | Admitting: Family Medicine

## 2012-12-20 NOTE — Telephone Encounter (Signed)
For Dr Raymondo Band: Pt had contacted [facility] about getting orange card.Found out she is not eligible for orange card because she is not a resident of [facility]. She did get a monitor thru her pharmacy in [place]. She is planning to contact the company of the monitor to see if there is some type of financial relief.  Once she gets this all worked out, she will come back to learn how to use the monitor.

## 2012-12-20 NOTE — Telephone Encounter (Signed)
Will forward to Dr. Koval 

## 2013-01-11 ENCOUNTER — Ambulatory Visit (INDEPENDENT_AMBULATORY_CARE_PROVIDER_SITE_OTHER): Payer: Self-pay | Admitting: Pharmacist

## 2013-01-11 ENCOUNTER — Encounter: Payer: Self-pay | Admitting: Pharmacist

## 2013-01-11 VITALS — BP 118/74 | HR 64 | Ht 63.0 in | Wt 219.0 lb

## 2013-01-11 DIAGNOSIS — I10 Essential (primary) hypertension: Secondary | ICD-10-CM

## 2013-01-11 DIAGNOSIS — E119 Type 2 diabetes mellitus without complications: Secondary | ICD-10-CM

## 2013-01-11 NOTE — Assessment & Plan Note (Signed)
Hypertenstion improved control today with use of atenolol 100mg  once daily (was taking both tablets at the same time) AND HCTZ 50mg  (2X 25mg ) once daily.  Decrease both to ONE dose (atenolol 50mg  and HCTZ 25mg ) once daily.  May consider using alternative BP regimen.  Patient states intolerance to other BP meds however none are listed on chart.   Consider new ACE or ARB combo with thiazide as an alternative option for this patient.   BP is likely improved with weight loss.   Encouraged continued efforts on weight loss.  Written patient instructions provided.  Follow up in Pharmacist Clinic Visit after next visit with Dr. Shawnie Pons.   Total time in face to face counseling 35 minutes.  Patient seen with [name] - Med Student-4

## 2013-01-11 NOTE — Assessment & Plan Note (Signed)
Diabetes currently with under slightly improved control since her A1c evaluation . Lab Results  Component Value Date   HGBA1C 11.4 11/15/2012   Denies hypoglycemic events and is able to verbalize appropriate hypoglycemia management plan.  Reports adherence with medication.  Control is suboptimal due to dietary non adherence, sedentary lifestyle and suboptimal medicine regimen due to inability to pay AND unwillingness to use injectable tx . Continued metformin, and amaryl once daily.   Encouraged diet and exercise modification.   She has improved her symptom control with this regimen AND now has the ability to check blood sugars.   Following instruction she verbalized willingness to check blood sugar once daily and record.  She was asked to check fastings AND post-prandials.    Hypertenstion improved control today with use of atenolol 100mg  once daily (was taking both tablets at the same time) AND HCTZ 50mg  (2X 25mg ) once daily.  Decrease both to ONE dose (atenolol 50mg  and HCTZ 25mg ) once daily.  May consider using alternative BP regimen.  Patient states intolerance to other BP meds however none are listed on chart.   Consider new ACE or ARB combo with thiazide as an alternative option for this patient.   BP is likely improved with weight loss.   Encouraged continued efforts on weight loss.  Written patient instructions provided.  Follow up in Pharmacist Clinic Visit after next visit with Dr. Shawnie Pons.   Total time in face to face counseling 35 minutes.  Patient seen with [name] - Med Student-4

## 2013-01-11 NOTE — Patient Instructions (Addendum)
Keep up the great work you are doing on your diet.    Please try to exercise more.    Walking 3 days per week is your current plan. Stay active doing something fun!  Continue taking Amaryl (glimepiride) 4mg  - green pill once daily in the AM Continue taking metformin (white pill) 1000mg  once daily in the AM  Blood Pressure pills  Take ONE atenolol in the morning.  Take ONE HCTZ in the morning.  Follow up with Dr.Pratt  Then follow up with me in September.

## 2013-01-11 NOTE — Progress Notes (Signed)
S:    Patient arrives in good spirits.    She presents to the clinic for diabetes follow up.  She has determined that she is not eleigible for the orange card.   She is unaware of insurance assistance in her home county.  Patient reports having history of Diabetes and reports adherence with amaryl 4mg  and metformin 1000mg  ONCE daily.   She reports decreased nocturia AND improved vision (less blurry).   She is drinking more diet soda and avoiding sweet tea.    She has purchased a new disposable meter for testing.  She will need to buy lancet devices.   O:  In office cBG with new meter 227mg /dl  A/P: Diabetes currently with under slightly improved control since her A1c evaluation . Lab Results  Component Value Date   HGBA1C 11.4 11/15/2012    Denies hypoglycemic events and is able to verbalize appropriate hypoglycemia management plan.  Reports adherence with medication.  Control is suboptimal due to dietary non adherence, sedentary lifestyle and suboptimal medicine regimen due to inability to pay AND unwillingness to use injectable tx . Continued metformin, and amaryl once daily.   Encouraged diet and exercise modification.   She has improved her symptom control with this regimen AND now has the ability to check blood sugars.   Following instruction she verbalized willingness to check blood sugar once daily and record.  She was asked to check fastings AND post-prandials.    Hypertenstion improved control today with use of atenolol 100mg  once daily (was taking both tablets at the same time) AND HCTZ 50mg  (2X 25mg ) once daily.  Decrease both to ONE dose (atenolol 50mg  and HCTZ 25mg ) once daily.  May consider using alternative BP regimen.  Patient states intolerance to other BP meds however none are listed on chart.   Consider new ACE or ARB combo with thiazide as an alternative option for this patient.   BP is likely improved with weight loss.   Encouraged continued efforts on weight loss.  Written  patient instructions provided.  Follow up in Pharmacist Clinic Visit after next visit with Dr. Shawnie Pons.   Total time in face to face counseling 35 minutes.  Patient seen with [name] - Med Student-4

## 2013-02-01 ENCOUNTER — Ambulatory Visit (INDEPENDENT_AMBULATORY_CARE_PROVIDER_SITE_OTHER): Payer: Self-pay | Admitting: Family Medicine

## 2013-02-01 ENCOUNTER — Encounter: Payer: Self-pay | Admitting: Family Medicine

## 2013-02-01 VITALS — BP 143/59 | HR 79 | Temp 98.0°F | Ht 63.0 in | Wt 222.7 lb

## 2013-02-01 DIAGNOSIS — E119 Type 2 diabetes mellitus without complications: Secondary | ICD-10-CM

## 2013-02-01 DIAGNOSIS — I10 Essential (primary) hypertension: Secondary | ICD-10-CM

## 2013-02-01 MED ORDER — LISINOPRIL 10 MG PO TABS: 10.0000 mg | ORAL_TABLET | Freq: Every day | ORAL | Status: DC

## 2013-02-01 NOTE — Progress Notes (Signed)
  Subjective:    Patient ID: Carmen [name], female    DOB: 1961-03-01, 52 y.o.   MRN: [id number]  Hypertension This is a chronic problem. The problem is uncontrolled. Pertinent negatives include no chest pain, shortness of breath or sweats. There are no associated agents to hypertension. Past treatments include beta blockers and diuretics. Compliance problems include diet and exercise.   Diabetes She presents for her follow-up diabetic visit. She has type 2 diabetes mellitus. No MedicAlert identification noted. Her disease course has been improving. Pertinent negatives for hypoglycemia include no pallor or sweats. Pertinent negatives for diabetes include no chest pain, no fatigue, no polydipsia and no polyphagia. Symptoms are improving. Risk factors for coronary artery disease include hypertension, sedentary lifestyle, post-menopausal and obesity. Current diabetic treatment includes oral agent (dual therapy). Her weight is fluctuating minimally. She is following a diabetic diet. She has not had a previous visit with a dietician. She rarely participates in exercise. There is no change in her home blood glucose trend. Her breakfast blood glucose is taken between 7-8 am. Her breakfast blood glucose range is generally 140-180 mg/dl. An ACE inhibitor/angiotensin II receptor blocker is not being taken. She does not see a podiatrist.Eye exam is not current.   Had retinal eval with eye MD--no evidence of retinopathy.  Has some numbness in leg.  Not exercising.  Trying to eat well.  Weight is up 3 pounds since last visit.   Review of Systems  Constitutional: Negative for fatigue.  HENT: Positive for ear pain and ear discharge.   Respiratory: Negative for cough and shortness of breath.   Cardiovascular: Negative for chest pain.  Gastrointestinal: Negative for nausea, abdominal pain and diarrhea.  Endocrine: Negative for polydipsia and polyphagia.  Genitourinary: Negative for hematuria.  Skin: Negative  for pallor.       Objective:   Physical Exam  Vitals reviewed. Constitutional: She appears well-developed and well-nourished.  HENT:  Head: Normocephalic and atraumatic.  Eyes: No scleral icterus.  Neck: Neck supple.  Cardiovascular: Normal rate and regular rhythm.   Pulmonary/Chest: Effort normal.  Skin: Skin is warm and dry.  Area on back is healed.  There is no erythema.          Assessment & Plan:

## 2013-02-01 NOTE — Assessment & Plan Note (Signed)
Previously on lisinopril--had come off in 2009 as she was trying to get pregnant.  Tolerated the medication fine.  Will add Lisinopril back.  If tolerates, could combine for one pill with pt.

## 2013-02-01 NOTE — Assessment & Plan Note (Signed)
Continue to work on diet and exercise.  BS control is improved, but could be better.

## 2013-02-07 ENCOUNTER — Ambulatory Visit: Payer: No Typology Code available for payment source

## 2013-03-01 ENCOUNTER — Encounter: Payer: Self-pay | Admitting: Pharmacist

## 2013-03-01 ENCOUNTER — Other Ambulatory Visit: Payer: Self-pay | Admitting: Family Medicine

## 2013-03-01 ENCOUNTER — Ambulatory Visit (INDEPENDENT_AMBULATORY_CARE_PROVIDER_SITE_OTHER): Payer: Self-pay | Admitting: Pharmacist

## 2013-03-01 ENCOUNTER — Other Ambulatory Visit: Payer: No Typology Code available for payment source

## 2013-03-01 VITALS — BP 123/63 | HR 64 | Ht 63.0 in | Wt 220.6 lb

## 2013-03-01 DIAGNOSIS — E119 Type 2 diabetes mellitus without complications: Secondary | ICD-10-CM

## 2013-03-01 DIAGNOSIS — J309 Allergic rhinitis, unspecified: Secondary | ICD-10-CM

## 2013-03-01 DIAGNOSIS — I10 Essential (primary) hypertension: Secondary | ICD-10-CM

## 2013-03-01 DIAGNOSIS — R5381 Other malaise: Secondary | ICD-10-CM

## 2013-03-01 LAB — COMPREHENSIVE METABOLIC PANEL
ALT: 19 U/L (ref 0–35)
Albumin: 4 g/dL (ref 3.5–5.2)
CO2: 28 mEq/L (ref 19–32)
Calcium: 8.8 mg/dL (ref 8.4–10.5)
Chloride: 99 mEq/L (ref 96–112)
Glucose, Bld: 163 mg/dL — ABNORMAL HIGH (ref 70–99)
Potassium: 3.2 mEq/L — ABNORMAL LOW (ref 3.5–5.3)
Sodium: 137 mEq/L (ref 135–145)
Total Bilirubin: 0.5 mg/dL (ref 0.3–1.2)
Total Protein: 6.6 g/dL (ref 6.0–8.3)

## 2013-03-01 LAB — CBC
MCV: 86.7 fL (ref 78.0–100.0)
Platelets: 262 10*3/uL (ref 150–400)
RDW: 13.2 % (ref 11.5–15.5)
WBC: 9.3 10*3/uL (ref 4.0–10.5)

## 2013-03-01 LAB — LIPID PANEL: LDL Cholesterol: 99 mg/dL (ref 0–99)

## 2013-03-01 MED ORDER — LISINOPRIL-HYDROCHLOROTHIAZIDE 20-12.5 MG PO TABS: 1.0000 | ORAL_TABLET | Freq: Every day | ORAL | Status: DC

## 2013-03-01 MED ORDER — LORATADINE 10 MG PO TABS: 10.0000 mg | ORAL_TABLET | Freq: Every day | ORAL | Status: DC

## 2013-03-01 MED ORDER — METFORMIN HCL ER 750 MG PO TB24: 1500.0000 mg | ORAL_TABLET | Freq: Every day | ORAL | Status: DC

## 2013-03-01 NOTE — Assessment & Plan Note (Addendum)
As for her allergies, patient instructed to use her Nasacort 2 sprays twice daily, normal saline spray prn, and loratadine 10mg  daily.  Asked her to avoid use of diphenhydramine (cheap antihistamines).

## 2013-03-01 NOTE — Patient Instructions (Addendum)
Good to see you today.  You have made great progress! Keep up the great work. We will make several changes today:  1. Please take metformin 750 mg XR 2 tablets daily before breakfast 2. Nasacort 2 sprays into each nostril twice daily (morning and evening) 3. Use nasal saline spray (OTC) as needed for congestion 4. Please take loratadine 10 mg once daily for your allergies 5. As soon as you run out of any of the blood pressure pills (atenolol, lisinopril, or hydrochlorothiazide), please start taking your lisinopril 20mg /HCTZ 12.5mg  combination pill once daily  Continue staying conscious with your diet and exercising as you can.  Next visit with Dr. Shawnie Pons, we will see you one month after that visit.

## 2013-03-01 NOTE — Assessment & Plan Note (Signed)
Based on her blood pressure 123/63 HR 64, we will also d/c her atenolol and combine her lisinopril 10mg  and HCTZ 25mg  into a Lisinopril 20mg  /HCTZ 12.5mg  combination pill. Patient instructed to continue with home meds until running out of one, in which she is then to start new combination tablet while discarding the rest.

## 2013-03-01 NOTE — Assessment & Plan Note (Signed)
Diabetes diagnosed in May 2014 currently on Metformin 1000 mg once daily and Glimepiride 4 mg once daily. Most recent A1C in June was 11.4%. CBGs per patient log 160s-180s, high 229, low 141. Denies hypoglycemic events and is able to verbalize appropriate hypoglycemia management plan.  Reports adherence with medication. Control is suboptimal due to dietary indiscretion and social stressors, though she has made significant improvement over the last few months. Her weight is also on the decline (lost 20 lbs since March). As we would like to continue pushing patient towards her goal, we will change her metformin 1000mg  once daily to 750mg  XR 2 tablets once daily. We will continue her current dose of glimepiride. Written patient instructions provided.  Follow up in Pharmacist Clinic Visit one month after seeing Dr. Shawnie Pons.   Total time in face to face counseling 45 minutes.  Patient seen with Anthony Sar, PharmD Resident

## 2013-03-01 NOTE — Progress Notes (Signed)
S:    Patient arrives today somewhat congested due to her allergies, but in good spirits.    Presents for diabetes follow up. At her last visit, she was given a glucometer and POC CBG was 227. She is currently unemployed and has financial issues, not eligible for the orange card as she lives in [facility]; reports that as a contributor of recent stress. Patient reports having history of Diabetes since May 2014, currently on metformin 1000 mg once daily and glimepiride 4 mg once daily; reports slight discomfort with metformin. In regards to diet, she admits to being conscientious and willing to eat healthy foods but based on current living situation with mother on weekdays and boyfriend on the weekends, typically will eat whatever is available as her mother is the primary grocery shopper. Denies any form of consistent exercise.  HTN meds - atenolol 50 mg once daily, HCTZ 25 mg once daily, lisinopril 10mg  once daily  O: 123/63 HR 64  Wt 220.6 lb (20 lb wt loss since March)  Lab Results  Component Value Date   HGBA1C 11.4 11/15/2012  home fasting CBG readings of 160-180s, highest 229 and low of 141  A/P: Diabetes diagnosed in May 2014 currently on Metformin 1000 mg once daily and Glimepiride 4 mg once daily. Most recent A1C in June was 11.4%. CBGs per patient log 160s-180s, high 229, low 141. Denies hypoglycemic events and is able to verbalize appropriate hypoglycemia management plan.  Reports adherence with medication. . Control is suboptimal due to dietary indiscretion and social stressors, though she has made significant improvement over the last few months. Her weight is also on the decline (lost 20 lbs since March). As we would like to continue pushing patient towards her goal, we will change her metformin 1000mg  once daily to 750mg  XR 2 tablets once daily. We will continue her current dose of glimepiride.   Based on her blood pressure 123/63 HR 64, we will also d/c her atenolol and combine  her lisinopril 10mg  and HCTZ 25mg  into a Lisinopril 20mg  /HCTZ 12.5mg  combination pill. Patient instructed to continue with home meds until running out of one, in which she is then to start new combination tablet while discarding the rest.   As for her allergies, patient instructed to use her Nasacort 2 sprays twice daily, normal saline spray prn, and loratadine 10mg  daily.  Written patient instructions provided.  Follow up in Pharmacist Clinic Visit one month after seeing Dr. Shawnie Pons.   Total time in face to face counseling 45 minutes.  Patient seen with Anthony Sar, PharmD Resident.

## 2013-03-02 LAB — TSH: TSH: 3.709 u[IU]/mL (ref 0.350–4.500)

## 2013-03-05 ENCOUNTER — Encounter: Payer: Self-pay | Admitting: Family Medicine

## 2013-03-08 NOTE — Progress Notes (Signed)
Patient ID: Carmen [name], female   DOB: 1960/11/25, 52 y.o.   MRN: [id number] Reviewed: Agree with Dr. Macky Lower documentation and management.

## 2013-03-21 ENCOUNTER — Encounter: Payer: Self-pay | Admitting: Family Medicine

## 2013-03-21 ENCOUNTER — Ambulatory Visit (INDEPENDENT_AMBULATORY_CARE_PROVIDER_SITE_OTHER): Payer: Self-pay | Admitting: Family Medicine

## 2013-03-21 VITALS — BP 138/83 | HR 103 | Temp 98.5°F | Wt 218.0 lb

## 2013-03-21 DIAGNOSIS — E119 Type 2 diabetes mellitus without complications: Secondary | ICD-10-CM

## 2013-03-21 DIAGNOSIS — I10 Essential (primary) hypertension: Secondary | ICD-10-CM

## 2013-03-21 LAB — POCT GLYCOSYLATED HEMOGLOBIN (HGB A1C): Hemoglobin A1C: 6.2

## 2013-03-21 MED ORDER — LISINOPRIL-HYDROCHLOROTHIAZIDE 20-12.5 MG PO TABS: 1.0000 | ORAL_TABLET | Freq: Every day | ORAL | Status: DC

## 2013-03-21 MED ORDER — METFORMIN HCL ER 750 MG PO TB24: 1500.0000 mg | ORAL_TABLET | Freq: Every day | ORAL | Status: DC

## 2013-03-21 NOTE — Patient Instructions (Signed)
Type 2 Diabetes Mellitus, Adult Type 2 diabetes mellitus, often simply referred to as type 2 diabetes, is a long-lasting (chronic) disease. In type 2 diabetes, the pancreas does not make enough insulin (a hormone), the cells are less responsive to the insulin that is made (insulin resistance), or both. Normally, insulin moves sugars from food into the tissue cells. The tissue cells use the sugars for energy. The lack of insulin or the lack of normal response to insulin causes excess sugars to build up in the blood instead of going into the tissue cells. As a result, high blood sugar (hyperglycemia) develops. The effect of high sugar (glucose) levels can cause many complications. Type 2 diabetes was also previously called adult-onset diabetes but it can occur at any age.  RISK FACTORS  A person is predisposed to developing type 2 diabetes if someone in the family has the disease and also has one or more of the following primary risk factors:  Overweight.  An inactive lifestyle.  A history of consistently eating high-calorie foods. Maintaining a normal weight and regular physical activity can reduce the chance of developing type 2 diabetes. SYMPTOMS  A person with type 2 diabetes may not show symptoms initially. The symptoms of type 2 diabetes appear slowly. The symptoms include:  Increased thirst (polydipsia).  Increased urination (polyuria).  Increased urination during the night (nocturia).  Weight loss. This weight loss may be rapid.  Frequent, recurring infections.  Tiredness (fatigue).  Weakness.  Vision changes, such as blurred vision.  Fruity smell to your breath.  Abdominal pain.  Nausea or vomiting.  Cuts or bruises which are slow to heal.  Tingling or numbness in the hands or feet. DIAGNOSIS Type 2 diabetes is frequently not diagnosed until complications of diabetes are present. Type 2 diabetes is diagnosed when symptoms or complications are present and when blood  glucose levels are increased. Your blood glucose level may be checked by one or more of the following blood tests:  A fasting blood glucose test. You will not be allowed to eat for at least 8 hours before a blood sample is taken.  A random blood glucose test. Your blood glucose is checked at any time of the day regardless of when you ate.  A hemoglobin A1c blood glucose test. A hemoglobin A1c test provides information about blood glucose control over the previous 3 months.  An oral glucose tolerance test (OGTT). Your blood glucose is measured after you have not eaten (fasted) for 2 hours and then after you drink a glucose-containing beverage. TREATMENT   You may need to take insulin or diabetes medicine daily to keep blood glucose levels in the desired range.  You will need to match insulin dosing with exercise and healthy food choices. The treatment goal is to maintain the before meal blood sugar (preprandial glucose) level at 70 130 mg/dL. HOME CARE INSTRUCTIONS   Have your hemoglobin A1c level checked twice a year.  Perform daily blood glucose monitoring as directed by your caregiver.  Monitor urine ketones when you are ill and as directed by your caregiver.  Take your diabetes medicine or insulin as directed by your caregiver to maintain your blood glucose levels in the desired range.  Never run out of diabetes medicine or insulin. It is needed every day.  Adjust insulin based on your intake of carbohydrates. Carbohydrates can raise blood glucose levels but need to be included in your diet. Carbohydrates provide vitamins, minerals, and fiber which are an essential part of   a healthy diet. Carbohydrates are found in fruits, vegetables, whole grains, dairy products, legumes, and foods containing added sugars.    Eat healthy foods. Alternate 3 meals with 3 snacks.  Lose weight if overweight.  Carry a medical alert card or wear your medical alert jewelry.  Carry a 15 gram  carbohydrate snack with you at all times to treat low blood glucose (hypoglycemia). Some examples of 15 gram carbohydrate snacks include:  Glucose tablets, 3 or 4   Glucose gel, 15 gram tube  Raisins, 2 tablespoons (24 grams)  Jelly beans, 6  Animal crackers, 8  Regular pop, 4 ounces (120 mL)  Gummy treats, 9  Recognize hypoglycemia. Hypoglycemia occurs with blood glucose levels of 70 mg/dL and below. The risk for hypoglycemia increases when fasting or skipping meals, during or after intense exercise, and during sleep. Hypoglycemia symptoms can include:  Tremors or shakes.  Decreased ability to concentrate.  Sweating.  Increased heart rate.  Headache.  Dry mouth.  Hunger.  Irritability.  Anxiety.  Restless sleep.  Altered speech or coordination.  Confusion.  Treat hypoglycemia promptly. If you are alert and able to safely swallow, follow the 15:15 rule:  Take 15 20 grams of rapid-acting glucose or carbohydrate. Rapid-acting options include glucose gel, glucose tablets, or 4 ounces (120 mL) of fruit juice, regular soda, or low fat milk.  Check your blood glucose level 15 minutes after taking the glucose.  Take 15 20 grams more of glucose if the repeat blood glucose level is still 70 mg/dL or below.  Eat a meal or snack within 1 hour once blood glucose levels return to normal.    Be alert to polyuria and polydipsia which are early signs of hyperglycemia. An early awareness of hyperglycemia allows for prompt treatment. Treat hyperglycemia as directed by your caregiver.  Engage in at least 150 minutes of moderate-intensity physical activity a week, spread over at least 3 days of the week or as directed by your caregiver. In addition, you should engage in resistance exercise at least 2 times a week or as directed by your caregiver.  Adjust your medicine and food intake as needed if you start a new exercise or sport.  Follow your sick day plan at any time you  are unable to eat or drink as usual.  Avoid tobacco use.  Limit alcohol intake to no more than 1 drink per day for nonpregnant women and 2 drinks per day for men. You should drink alcohol only when you are also eating food. Talk with your caregiver whether alcohol is safe for you. Tell your caregiver if you drink alcohol several times a week.  Follow up with your caregiver regularly.  Schedule an eye exam soon after the diagnosis of type 2 diabetes and then annually.  Perform daily skin and foot care. Examine your skin and feet daily for cuts, bruises, redness, nail problems, bleeding, blisters, or sores. A foot exam by a caregiver should be done annually.  Brush your teeth and gums at least twice a day and floss at least once a day. Follow up with your dentist regularly.  Share your diabetes management plan with your workplace or school.  Stay up-to-date with immunizations.  Learn to manage stress.  Obtain ongoing diabetes education and support as needed.  Participate in, or seek rehabilitation as needed to maintain or improve independence and quality of life. Request a physical or occupational therapy referral if you are having foot or hand numbness or difficulties with grooming,   dressing, eating, or physical activity. SEEK MEDICAL CARE IF:   You are unable to eat food or drink fluids for more than 6 hours.  You have nausea and vomiting for more than 6 hours.  Your blood glucose level is over 240 mg/dL.  There is a change in mental status.  You develop an additional serious illness.  You have diarrhea for more than 6 hours.  You have been sick or have had a fever for a couple of days and are not getting better.  You have pain during any physical activity.  SEEK IMMEDIATE MEDICAL CARE IF:  You have difficulty breathing.  You have moderate to large ketone levels. MAKE SURE YOU:  Understand these instructions.  Will watch your condition.  Will get help right away if  you are not doing well or get worse. Document Released: 05/24/2005 Document Revised: 02/16/2012 Document Reviewed: 12/21/2011 [facility] Patient Information 2014 [place], [place]. Diabetes and Exercise Regular exercise is important and can help:   Control blood glucose (sugar).  Decrease blood pressure.    Control blood lipids (cholesterol, triglycerides).  Improve overall health. BENEFITS FROM EXERCISE  Improved fitness.  Improved flexibility.  Improved endurance.  Increased bone density.  Weight control.  Increased muscle strength.  Decreased body fat.  Improvement of the body's use of insulin, a hormone.  Increased insulin sensitivity.  Reduction of insulin needs.  Reduced stress and tension.  Helps you feel better. People with diabetes who add exercise to their lifestyle gain additional benefits, including:  Weight loss.  Reduced appetite.  Improvement of the body's use of blood glucose.  Decreased risk factors for heart disease:  Lowering of cholesterol and triglycerides.  Raising the level of good cholesterol (high-density lipoproteins, HDL).  Lowering blood sugar.  Decreased blood pressure. TYPE 1 DIABETES AND EXERCISE  Exercise will usually lower your blood glucose.  If blood glucose is greater than 240 mg/dl, check urine ketones. If ketones are present, do not exercise.  Location of the insulin injection sites may need to be adjusted with exercise. Avoid injecting insulin into areas of the body that will be exercised. For example, avoid injecting insulin into:  The arms when playing tennis.  The legs when jogging. For more information, discuss this with your caregiver.  Keep a record of:  Food intake.  Type and amount of exercise.  Expected peak times of insulin action.  Blood glucose levels. Do this before, during, and after exercise. Review your records with your caregiver. This will help you to develop guidelines for adjusting  food intake and insulin amounts.  TYPE 2 DIABETES AND EXERCISE  Regular physical activity can help control blood glucose.  Exercise is important because it may:  Increase the body's sensitivity to insulin.  Improve blood glucose control.  Exercise reduces the risk of heart disease. It decreases serum cholesterol and triglycerides. It also lowers blood pressure.  Those who take insulin or oral hypoglycemic agents should watch for signs of hypoglycemia. These signs include dizziness, shaking, sweating, chills, and confusion.  Body water is lost during exercise. It must be replaced. This will help to avoid loss of body fluids (dehydration) or heat stroke. Be sure to talk to your caregiver before starting an exercise program to make sure it is safe for you. Remember, any activity is better than none.  Document Released: 08/14/2003 Document Revised: 08/16/2011 Document Reviewed: 11/28/2008 [facility] Patient Information 2014 [place], [place].

## 2013-03-21 NOTE — Progress Notes (Signed)
  Subjective:    Patient ID: Carmen [name], female    DOB: Apr 12, 1961, 52 y.o.   MRN: [id number]  Diabetes She presents for her follow-up diabetic visit. She has type 2 diabetes mellitus. The initial diagnosis of diabetes was made 4 months ago. Her disease course has been improving. Hypoglycemia symptoms include dizziness, pallor and sweats. Associated symptoms include fatigue, polydipsia and weight loss. Pertinent negatives for diabetes include no chest pain. Symptoms are improving. There are no diabetic complications. Risk factors for coronary artery disease include hypertension, obesity, post-menopausal and sedentary lifestyle. Current diabetic treatment includes diet and oral agent (dual therapy). She is compliant with treatment most of the time. Her weight is decreasing steadily. She is following a diabetic diet. She has not had a previous visit with a dietician. She rarely participates in exercise. Her home blood glucose trend is decreasing steadily. Her breakfast blood glucose range is generally 140-180 mg/dl. Her dinner blood glucose range is generally 140-180 mg/dl. An ACE inhibitor/angiotensin II receptor blocker is being taken. She does not see a podiatrist.Eye exam is not current.      Review of Systems  Constitutional: Positive for weight loss and fatigue. Negative for chills.  HENT: Negative for ear pain.   Respiratory: Negative for shortness of breath.   Cardiovascular: Negative for chest pain.  Gastrointestinal: Negative for abdominal pain.  Endocrine: Positive for polydipsia.  Genitourinary: Negative for dysuria.  Skin: Positive for pallor.  Neurological: Positive for dizziness.       Objective:   Physical Exam  Vitals reviewed. Constitutional: She appears well-developed and well-nourished. No distress.  HENT:  Head: Normocephalic and atraumatic.  Neck: Neck supple.  Cardiovascular: Normal rate.   Pulmonary/Chest: Effort normal.  Abdominal: Soft.  Neurological:  She is alert.  Skin: Skin is warm and dry.  Psychiatric: She has a normal mood and affect.          Assessment & Plan:

## 2013-03-21 NOTE — Progress Notes (Deleted)
  Subjective:    Patient ID: Carmen [name], female    DOB: December 08, 1960, 52 y.o.   MRN: [id number]  HPI    Review of Systems     Objective:   Physical Exam        Assessment & Plan:

## 2013-03-21 NOTE — Assessment & Plan Note (Addendum)
Has not filled new rx for combination HCTZ/Lisinopril--will refill soon.  Taking occasional Atenolol. BP up slightly today--should improve with medication change.

## 2013-03-21 NOTE — Assessment & Plan Note (Signed)
HgbA1C is 6.2 today--pt. Congratulated.  Will continue current regimen.  She reports one episode of hypoglycemia.  Encourage her to continue medications as prescribed.  Has times when she doesn't take meds as prescribed and is not following diet appropriately.

## 2013-04-30 ENCOUNTER — Ambulatory Visit (INDEPENDENT_AMBULATORY_CARE_PROVIDER_SITE_OTHER): Payer: Self-pay | Admitting: Family Medicine

## 2013-04-30 VITALS — BP 137/87 | HR 52 | Temp 97.9°F | Ht 62.0 in | Wt 232.0 lb

## 2013-04-30 DIAGNOSIS — J309 Allergic rhinitis, unspecified: Secondary | ICD-10-CM

## 2013-04-30 NOTE — Patient Instructions (Signed)
Allergic Rhinitis Allergic rhinitis is when the mucous membranes in the nose respond to allergens. Allergens are particles in the air that cause your body to have an allergic reaction. This causes you to release allergic antibodies. Through a chain of events, these eventually cause you to release histamine into the blood stream (hence the use of antihistamines). Although meant to be protective to the body, it is this release that causes your discomfort, such as frequent sneezing, congestion and an itchy runny nose.  CAUSES  The pollen allergens may come from grasses, trees, and weeds. This is seasonal allergic rhinitis, or "hay fever." Other allergens cause year-round allergic rhinitis (perennial allergic rhinitis) such as house dust mite allergen, pet dander and mold spores.  SYMPTOMS   Nasal stuffiness (congestion).  Runny, itchy nose with sneezing and tearing of the eyes.  There is often an itching of the mouth, eyes and ears. It cannot be cured, but it can be controlled with medications. DIAGNOSIS  If you are unable to determine the offending allergen, skin or blood testing may find it. TREATMENT   Avoid the allergen.  Medications and allergy shots (immunotherapy) can help.  Hay fever may often be treated with antihistamines in pill or nasal spray forms. Antihistamines block the effects of histamine. There are over-the-counter medicines that may help with nasal congestion and swelling around the eyes. Check with your caregiver before taking or giving this medicine. If the treatment above does not work, there are many new medications your caregiver can prescribe. Stronger medications may be used if initial measures are ineffective. Desensitizing injections can be used if medications and avoidance fails. Desensitization is when a patient is given ongoing shots until the body becomes less sensitive to the allergen. Make sure you follow up with your caregiver if problems continue. SEEK MEDICAL  CARE IF:   You develop fever (more than 100.5 F (38.1 C).  You develop a cough that does not stop easily (persistent).  You have shortness of breath.  You start wheezing.  Symptoms interfere with normal daily activities. Document Released: 02/16/2001 Document Revised: 08/16/2011 Document Reviewed: 08/28/2008 [employer] Patient Information 2014 [employer].  

## 2013-04-30 NOTE — Progress Notes (Signed)
Subjective:     Patient ID: [name], female   DOB: 10/25/1960, 52 y.o.   MRN: [id number]  HPI 52 y.o. F here for evaluation of sinus and congestion. Pt reports sinus headaches. Pt reports wheezing for last week and has been using inhaler as well.  Pt using antihistamines and decongestants. Usually happens 2x a year. Pt usually uses [employer] decongestants, and nasocort. Pt feels like it is hard to catch breath when walking distances with increase work of breathing.   Review of Systems No fevers, off/on chills, no CP, +SOB, nonproductive cough. +GERD, no urinary or bowel symptoms. +PND    Objective:   Physical Exam Filed Vitals:   04/30/13 1128  BP: 137/87  Pulse: 52  Temp: 97.9 F (36.6 C)   NAD VSS CTAB no wcr No sinus tenderness, COL Bil, no LAD, no tonsilar exudate RRR no mgt     Assessment:     52 y.o. F here with likely allergic rhinitis without evidence of infection. No evidence of asthma or lower respiratory infection. Recommend pt continue nasacort and use for entire season Take claritin as prescribed daily.  Use decongestants as needed. Pt has an inhaler that she uses when wheezing. No wheezing at this time, may continue to use PRN. No refills at this time Return if not having adequate control.   Tawana Scale, MD OB Fellow

## 2013-06-14 ENCOUNTER — Encounter: Payer: Self-pay | Admitting: Family Medicine

## 2013-06-14 ENCOUNTER — Ambulatory Visit (INDEPENDENT_AMBULATORY_CARE_PROVIDER_SITE_OTHER): Payer: Self-pay | Admitting: Family Medicine

## 2013-06-14 VITALS — BP 168/102 | HR 70 | Temp 98.0°F | Wt 218.0 lb

## 2013-06-14 DIAGNOSIS — I10 Essential (primary) hypertension: Secondary | ICD-10-CM

## 2013-06-14 DIAGNOSIS — H6240 Otitis externa in other diseases classified elsewhere, unspecified ear: Secondary | ICD-10-CM

## 2013-06-14 DIAGNOSIS — J309 Allergic rhinitis, unspecified: Secondary | ICD-10-CM

## 2013-06-14 DIAGNOSIS — B368 Other specified superficial mycoses: Secondary | ICD-10-CM

## 2013-06-14 DIAGNOSIS — E119 Type 2 diabetes mellitus without complications: Secondary | ICD-10-CM

## 2013-06-14 DIAGNOSIS — B369 Superficial mycosis, unspecified: Secondary | ICD-10-CM

## 2013-06-14 LAB — POCT GLYCOSYLATED HEMOGLOBIN (HGB A1C): Hemoglobin A1C: 5.9

## 2013-06-14 MED ORDER — METHYLPREDNISOLONE (PAK) 4 MG PO TABS: 4.0000 mg | ORAL_TABLET | Freq: Every day | ORAL | Status: DC

## 2013-06-14 MED ORDER — AZITHROMYCIN 250 MG PO TABS: 250.0000 mg | ORAL_TABLET | Freq: Every day | ORAL | Status: DC

## 2013-06-14 NOTE — Assessment & Plan Note (Signed)
Continue sweet oil--ENT prn

## 2013-06-14 NOTE — Patient Instructions (Addendum)
Type 2 Diabetes Mellitus, Adult Type 2 diabetes mellitus, often simply referred to as type 2 diabetes, is a long-lasting (chronic) disease. In type 2 diabetes, the pancreas does not make enough insulin (a hormone), the cells are less responsive to the insulin that is made (insulin resistance), or both. Normally, insulin moves sugars from food into the tissue cells. The tissue cells use the sugars for energy. The lack of insulin or the lack of normal response to insulin causes excess sugars to build up in the blood instead of going into the tissue cells. As a result, high blood sugar (hyperglycemia) develops. The effect of high sugar (glucose) levels can cause many complications. Type 2 diabetes was also previously called adult-onset diabetes but it can occur at any age.  RISK FACTORS  A person is predisposed to developing type 2 diabetes if someone in the family has the disease and also has one or more of the following primary risk factors:  Overweight.  An inactive lifestyle.  A history of consistently eating high-calorie foods. Maintaining a normal weight and regular physical activity can reduce the chance of developing type 2 diabetes. SYMPTOMS  A person with type 2 diabetes may not show symptoms initially. The symptoms of type 2 diabetes appear slowly. The symptoms include:  Increased thirst (polydipsia).  Increased urination (polyuria).  Increased urination during the night (nocturia).  Weight loss. This weight loss may be rapid.  Frequent, recurring infections.  Tiredness (fatigue).  Weakness.  Vision changes, such as blurred vision.  Fruity smell to your breath.  Abdominal pain.  Nausea or vomiting.  Cuts or bruises which are slow to heal.  Tingling or numbness in the hands or feet. DIAGNOSIS Type 2 diabetes is frequently not diagnosed until complications of diabetes are present. Type 2 diabetes is diagnosed when symptoms or complications are present and when blood  glucose levels are increased. Your blood glucose level may be checked by one or more of the following blood tests:  A fasting blood glucose test. You will not be allowed to eat for at least 8 hours before a blood sample is taken.  A random blood glucose test. Your blood glucose is checked at any time of the day regardless of when you ate.  A hemoglobin A1c blood glucose test. A hemoglobin A1c test provides information about blood glucose control over the previous 3 months.  An oral glucose tolerance test (OGTT). Your blood glucose is measured after you have not eaten (fasted) for 2 hours and then after you drink a glucose-containing beverage. TREATMENT   You may need to take insulin or diabetes medicine daily to keep blood glucose levels in the desired range.  You will need to match insulin dosing with exercise and healthy food choices. The treatment goal is to maintain the before meal blood sugar (preprandial glucose) level at 70 130 mg/dL. HOME CARE INSTRUCTIONS   Have your hemoglobin A1c level checked twice a year.  Perform daily blood glucose monitoring as directed by your caregiver.  Monitor urine ketones when you are ill and as directed by your caregiver.  Take your diabetes medicine or insulin as directed by your caregiver to maintain your blood glucose levels in the desired range.  Never run out of diabetes medicine or insulin. It is needed every day.  Adjust insulin based on your intake of carbohydrates. Carbohydrates can raise blood glucose levels but need to be included in your diet. Carbohydrates provide vitamins, minerals, and fiber which are an essential part of   a healthy diet. Carbohydrates are found in fruits, vegetables, whole grains, dairy products, legumes, and foods containing added sugars.    Eat healthy foods. Alternate 3 meals with 3 snacks.  Lose weight if overweight.  Carry a medical alert card or wear your medical alert jewelry.  Carry a 15 gram  carbohydrate snack with you at all times to treat low blood glucose (hypoglycemia). Some examples of 15 gram carbohydrate snacks include:  Glucose tablets, 3 or 4   Glucose gel, 15 gram tube  Raisins, 2 tablespoons (24 grams)  Jelly beans, 6  Animal crackers, 8  Regular pop, 4 ounces (120 mL)  Gummy treats, 9  Recognize hypoglycemia. Hypoglycemia occurs with blood glucose levels of 70 mg/dL and below. The risk for hypoglycemia increases when fasting or skipping meals, during or after intense exercise, and during sleep. Hypoglycemia symptoms can include:  Tremors or shakes.  Decreased ability to concentrate.  Sweating.  Increased heart rate.  Headache.  Dry mouth.  Hunger.  Irritability.  Anxiety.  Restless sleep.  Altered speech or coordination.  Confusion.  Treat hypoglycemia promptly. If you are alert and able to safely swallow, follow the 15:15 rule:  Take 15 20 grams of rapid-acting glucose or carbohydrate. Rapid-acting options include glucose gel, glucose tablets, or 4 ounces (120 mL) of fruit juice, regular soda, or low fat milk.  Check your blood glucose level 15 minutes after taking the glucose.  Take 15 20 grams more of glucose if the repeat blood glucose level is still 70 mg/dL or below.  Eat a meal or snack within 1 hour once blood glucose levels return to normal.    Be alert to polyuria and polydipsia which are early signs of hyperglycemia. An early awareness of hyperglycemia allows for prompt treatment. Treat hyperglycemia as directed by your caregiver.  Engage in at least 150 minutes of moderate-intensity physical activity a week, spread over at least 3 days of the week or as directed by your caregiver. In addition, you should engage in resistance exercise at least 2 times a week or as directed by your caregiver.  Adjust your medicine and food intake as needed if you start a new exercise or sport.  Follow your sick day plan at any time you  are unable to eat or drink as usual.  Avoid tobacco use.  Limit alcohol intake to no more than 1 drink per day for nonpregnant women and 2 drinks per day for men. You should drink alcohol only when you are also eating food. Talk with your caregiver whether alcohol is safe for you. Tell your caregiver if you drink alcohol several times a week.  Follow up with your caregiver regularly.  Schedule an eye exam soon after the diagnosis of type 2 diabetes and then annually.  Perform daily skin and foot care. Examine your skin and feet daily for cuts, bruises, redness, nail problems, bleeding, blisters, or sores. A foot exam by a caregiver should be done annually.  Brush your teeth and gums at least twice a day and floss at least once a day. Follow up with your dentist regularly.  Share your diabetes management plan with your workplace or school.  Stay up-to-date with immunizations.  Learn to manage stress.  Obtain ongoing diabetes education and support as needed.  Participate in, or seek rehabilitation as needed to maintain or improve independence and quality of life. Request a physical or occupational therapy referral if you are having foot or hand numbness or difficulties with grooming,   dressing, eating, or physical activity. SEEK MEDICAL CARE IF:   You are unable to eat food or drink fluids for more than 6 hours.  You have nausea and vomiting for more than 6 hours.  Your blood glucose level is over 240 mg/dL.  There is a change in mental status.  You develop an additional serious illness.  You have diarrhea for more than 6 hours.  You have been sick or have had a fever for a couple of days and are not getting better.  You have pain during any physical activity.  SEEK IMMEDIATE MEDICAL CARE IF:  You have difficulty breathing.  You have moderate to large ketone levels. MAKE SURE YOU:  Understand these instructions.  Will watch your condition.  Will get help right away if  you are not doing well or get worse. Document Released: 05/24/2005 Document Revised: 02/16/2012 Document Reviewed: 12/21/2011 [facility] Patient Information 2014 [place], [place]. Allergic Rhinitis Allergic rhinitis is when the mucous membranes in the nose respond to allergens. Allergens are particles in the air that cause your body to have an allergic reaction. This causes you to release allergic antibodies. Through a chain of events, these eventually cause you to release histamine into the blood stream (hence the use of antihistamines). Although meant to be protective to the body, it is this release that causes your discomfort, such as frequent sneezing, congestion and an itchy runny nose.  CAUSES  The pollen allergens may come from grasses, trees, and weeds. This is seasonal allergic rhinitis, or "hay fever." Other allergens cause year-round allergic rhinitis (perennial allergic rhinitis) such as house dust mite allergen, pet dander and mold spores.  SYMPTOMS   Nasal stuffiness (congestion).  Runny, itchy nose with sneezing and tearing of the eyes.  There is often an itching of the mouth, eyes and ears. It cannot be cured, but it can be controlled with medications. DIAGNOSIS  If you are unable to determine the offending allergen, skin or blood testing may find it. TREATMENT   Avoid the allergen.  Medications and allergy shots (immunotherapy) can help.  Hay fever may often be treated with antihistamines in pill or nasal spray forms. Antihistamines block the effects of histamine. There are over-the-counter medicines that may help with nasal congestion and swelling around the eyes. Check with your caregiver before taking or giving this medicine. If the treatment above does not work, there are many new medications your caregiver can prescribe. Stronger medications may be used if initial measures are ineffective. Desensitizing injections can be used if medications and avoidance fails.  Desensitization is when a patient is given ongoing shots until the body becomes less sensitive to the allergen. Make sure you follow up with your caregiver if problems continue. SEEK MEDICAL CARE IF:   You develop fever (more than 100.5 F (38.1 C).  You develop a cough that does not stop easily (persistent).  You have shortness of breath.  You start wheezing.  Symptoms interfere with normal daily activities. Document Released: 02/16/2001 Document Revised: 08/16/2011 Document Reviewed: 08/28/2008 [facility] Patient Information 2014 [place], [place]. Restless Legs Syndrome Restless legs syndrome is a movement disorder. It may also be called a sensori-motor disorder.  CAUSES  No one knows what specifically causes restless legs syndrome, but it tends to run in families. It is also more common in people with low iron, in pregnancy, in people who need dialysis, and those with nerve damage (neuropathy).Some medications may make restless legs syndrome worse.Those medications include drugs to treat high  blood pressure, some heart conditions, nausea, colds, allergies, and depression. SYMPTOMS Symptoms include uncomfortable sensations in the legs. These leg sensations are worse during periods of inactivity or rest. They are also worse while sitting or lying down. Individuals that have the disorder describe sensations in the legs that feel like:  Pulling.  Drawing.  Crawling.  Worming.  Boring.  Tingling.  Pins and needles.  Prickling.  Pain. The sensations are usually accompanied by an overwhelming urge to move the legs. Sudden muscle jerks may also occur. Movement provides temporary relief from the discomfort. In rare cases, the arms may also be affected. Symptoms may interfere with going to sleep (sleep onset insomnia). Restless legs syndrome may also be related to periodic limb movement disorder (PLMD). PLMD is another more common motor disorder. It also causes interrupted sleep. The  symptoms from PLMD usually occur most often when you are awake. TREATMENT  Treatment for restless legs syndrome is symptomatic. This means that the symptoms are treated.   Massage and cold compresses may provide temporary relief.  Walk, stretch, or take a cold or hot bath.  Get regular exercise and a good night's sleep.  Avoid caffeine, alcohol, nicotine, and medications that can make it worse.  Do activities that provide mental stimulation like discussions, needlework, and video games. These may be helpful if you are not able to walk or stretch. Some medications are effective in relieving the symptoms. However, many of these medications have side effects. Ask your caregiver about medications that may help your symptoms. Correcting iron deficiency may improve symptoms for some patients. Document Released: 05/14/2002 Document Revised: 08/16/2011 Document Reviewed: 08/20/2010 [facility] Patient Information 2014 [place].

## 2013-06-14 NOTE — Progress Notes (Signed)
   Subjective:    Patient ID: Carmen [name], female    DOB: 06/27/1960, 53 y.o.   MRN: [id number]  Diabetes She presents for her follow-up diabetic visit. She has type 2 diabetes mellitus. Her disease course has been stable. Hypoglycemia symptoms include headaches. Current diabetic treatment includes diet. She is compliant with treatment most of the time. Her weight is stable. She is following a diabetic diet. She has had a previous visit with a dietician. She monitors blood glucose at home 1-2 x per day. There is no change in her home blood glucose trend. Her breakfast blood glucose range is generally 140-180 mg/dl. Her lunch blood glucose range is generally 140-180 mg/dl.  URI  This is a chronic problem. The current episode started 1 to 4 weeks ago. The problem has been gradually worsening. There has been no fever. Associated symptoms include congestion, ear pain, headaches, a plugged ear sensation and sinus pain. Pertinent negatives include no abdominal pain. She has tried antihistamine, decongestant and NSAIDs for the symptoms. The treatment provided mild relief.      Review of Systems  Constitutional: Negative for fever and chills.  HENT: Positive for congestion and ear pain.   Respiratory: Negative for shortness of breath.   Cardiovascular: Negative for leg swelling.  Gastrointestinal: Negative for abdominal pain.  Musculoskeletal: Positive for arthralgias (? restless leg, but pain in leg with walking).  Neurological: Positive for headaches.       Objective:   Physical Exam  Vitals reviewed. Constitutional: She is oriented to person, place, and time. She appears well-developed and well-nourished. No distress.  HENT:  Head: Normocephalic and atraumatic.  Right Ear: There is swelling (with erythema and crusting).  Left Ear: Tympanic membrane and external ear normal.  Nose: Mucosal edema and rhinorrhea (pustular) present. Right sinus exhibits frontal sinus tenderness. Left  sinus exhibits frontal sinus tenderness.  Mouth/Throat: Oropharynx is clear and moist.  Cardiovascular: Normal rate.   Pulmonary/Chest: Effort normal.  Abdominal: Soft. There is no tenderness.  Neurological: She is alert and oriented to person, place, and time.  Skin: Skin is warm and dry.  Psychiatric: She has a normal mood and affect.          Assessment & Plan:

## 2013-06-14 NOTE — Assessment & Plan Note (Signed)
Self D/C'd all medications.  Check HgbA1C today off meds.  Weight is stable.

## 2013-06-14 NOTE — Assessment & Plan Note (Signed)
Previous BP's ok, but took decongestant and Ibuprofen today.

## 2013-06-14 NOTE — Assessment & Plan Note (Signed)
No relief of symptoms despite anti-histamine, decongestant and nasal steroid. Feels unwell.  Trial of medrol and Zpack.

## 2013-07-18 ENCOUNTER — Ambulatory Visit (INDEPENDENT_AMBULATORY_CARE_PROVIDER_SITE_OTHER): Payer: Self-pay | Admitting: Family Medicine

## 2013-07-18 ENCOUNTER — Encounter: Payer: Self-pay | Admitting: Family Medicine

## 2013-07-18 VITALS — BP 154/90 | HR 91 | Temp 98.0°F | Wt 222.0 lb

## 2013-07-18 DIAGNOSIS — E119 Type 2 diabetes mellitus without complications: Secondary | ICD-10-CM

## 2013-07-18 DIAGNOSIS — Z1239 Encounter for other screening for malignant neoplasm of breast: Secondary | ICD-10-CM

## 2013-07-18 DIAGNOSIS — I10 Essential (primary) hypertension: Secondary | ICD-10-CM

## 2013-07-18 DIAGNOSIS — E669 Obesity, unspecified: Secondary | ICD-10-CM

## 2013-07-18 DIAGNOSIS — J309 Allergic rhinitis, unspecified: Secondary | ICD-10-CM

## 2013-07-18 NOTE — Assessment & Plan Note (Signed)
Continue to work on weight loss 

## 2013-07-18 NOTE — Assessment & Plan Note (Signed)
Took decongestants earlier today which may explain elevated BP.

## 2013-07-18 NOTE — Assessment & Plan Note (Signed)
Working on it.  Using Zyrtec and Nasacort and decongestants prn.

## 2013-07-18 NOTE — Assessment & Plan Note (Signed)
Continue to watch diet, and exercise.

## 2013-07-18 NOTE — Patient Instructions (Signed)
Allergic Rhinitis Allergic rhinitis is when the mucous membranes in the nose respond to allergens. Allergens are particles in the air that cause your body to have an allergic reaction. This causes you to release allergic antibodies. Through a chain of events, these eventually cause you to release histamine into the blood stream. Although meant to protect the body, it is this release of histamine that causes your discomfort, such as frequent sneezing, congestion, and an itchy, runny nose.  CAUSES  Seasonal allergic rhinitis (hay fever) is caused by pollen allergens that may come from grasses, trees, and weeds. Year-round allergic rhinitis (perennial allergic rhinitis) is caused by allergens such as house dust mites, pet dander, and mold spores.  SYMPTOMS   Nasal stuffiness (congestion).  Itchy, runny nose with sneezing and tearing of the eyes. DIAGNOSIS  Your health care provider can help you determine the allergen or allergens that trigger your symptoms. If you and your health care provider are unable to determine the allergen, skin or blood testing may be used. TREATMENT  Allergic Rhinitis does not have a cure, but it can be controlled by:  Medicines and allergy shots (immunotherapy).  Avoiding the allergen. Hay fever may often be treated with antihistamines in pill or nasal spray forms. Antihistamines block the effects of histamine. There are over-the-counter medicines that may help with nasal congestion and swelling around the eyes. Check with your health care provider before taking or giving this medicine.  If avoiding the allergen or the medicine prescribed do not work, there are many new medicines your health care provider can prescribe. Stronger medicine may be used if initial measures are ineffective. Desensitizing injections can be used if medicine and avoidance does not work. Desensitization is when a patient is given ongoing shots until the body becomes less sensitive to the allergen.  Make sure you follow up with your health care provider if problems continue. HOME CARE INSTRUCTIONS It is not possible to completely avoid allergens, but you can reduce your symptoms by taking steps to limit your exposure to them. It helps to know exactly what you are allergic to so that you can avoid your specific triggers. SEEK MEDICAL CARE IF:   You have a fever.  You develop a cough that does not stop easily (persistent).  You have shortness of breath.  You start wheezing.  Symptoms interfere with normal daily activities. Document Released: 02/16/2001 Document Revised: 03/14/2013 Document Reviewed: 01/29/2013 [facility] Patient Information 2014 [place], [place]. Calorie Counting Diet A calorie counting diet requires you to eat the number of calories that are right for you in a day. Calories are the measurement of how much energy you get from the food you eat. Eating the right amount of calories is important for staying at a healthy weight. If you eat too many calories, your body will store them as fat and you may gain weight. If you eat too few calories, you may lose weight. Counting the number of calories you eat during a day will help you know if you are eating the right amount. A Registered Dietitian can determine how many calories you need in a day. The amount of calories needed varies from person to person. If your goal is to lose weight, you will need to eat fewer calories. Losing weight can benefit you if you are overweight or have health problems such as heart disease, high blood pressure, or diabetes. If your goal is to gain weight, you will need to eat more calories. Gaining weight may be  necessary if you have a certain health problem that causes your body to need more energy. TIPS Whether you are increasing or decreasing the number of calories you eat during a day, it may be hard to get used to changes in what you eat and drink. The following are tips to help you keep track of the  number of calories you eat.  Measure foods at home with measuring cups. This helps you know the amount of food and number of calories you are eating.  Restaurants often serve food in amounts that are larger than 1 serving. While eating out, estimate how many servings of a food you are given. For example, a serving of cooked rice is  cup or about the size of half of a fist. Knowing serving sizes will help you be aware of how much food you are eating at restaurants.  Ask for smaller portion sizes or child-size portions at restaurants.  Plan to eat half of a meal at a restaurant. Take the rest home or share the other half with a friend.  Read the Nutrition Facts panel on food labels for calorie content and serving size. You can find out how many servings are in a package, the size of a serving, and the number of calories each serving has.  For example, a package might contain 3 cookies. The Nutrition Facts panel on that package says that 1 serving is 1 cookie. Below that, it will say there are 3 servings in the container. The calories section of the Nutrition Facts label says there are 90 calories. This means there are 90 calories in 1 cookie (1 serving). If you eat 1 cookie you have eaten 90 calories. If you eat all 3 cookies, you have eaten 270 calories (3 servings x 90 calories = 270 calories). The list below tells you how big or small some common portion sizes are.  1 oz.........4 stacked dice.  3 oz........Marland Kitchen.Deck of cards.  1 tsp.......Marland Kitchen.Tip of little finger.  1 tbs......Marland Kitchen.Marland Kitchen.Thumb.  2 tbs.......Marland Kitchen.Golf ball.   cup......Marland Kitchen.Half of a fist.  1 cup.......Marland Kitchen.A fist. KEEP A FOOD LOG Write down every food item you eat, the amount you eat, and the number of calories in each food you eat during the day. At the end of the day, you can add up the total number of calories you have eaten. It may help to keep a list like the one below. Find out the calorie information by reading the Nutrition Facts panel on  food labels. Breakfast  Bran cereal (1 cup, 110 calories).  Fat-free milk ( cup, 45 calories). Snack  Apple (1 medium, 80 calories). Lunch  Spinach (1 cup, 20 calories).  Tomato ( medium, 20 calories).  Chicken breast strips (3 oz, 165 calories).  Shredded cheddar cheese ( cup, 110 calories).  Light [place] dressing (2 tbs, 60 calories).  Whole-wheat bread (1 slice, 80 calories).  Tub margarine (1 tsp, 35 calories).  Vegetable soup (1 cup, 160 calories). Dinner  Pork chop (3 oz, 190 calories).  Brown rice (1 cup, 215 calories).  Steamed broccoli ( cup, 20 calories).  Strawberries (1  cup, 65 calories).  Whipped cream (1 tbs, 50 calories). Daily Calorie Total: 1425 Document Released: 05/24/2005 Document Revised: 08/16/2011 Document Reviewed: 11/18/2006 [facility] Patient Information 2014 [place], [place].

## 2013-07-18 NOTE — Progress Notes (Signed)
    Subjective:    Patient ID: Carmen ComesFrances B Poyser is a 53 y.o. female presenting with Sinusitis and Follow-up  on 07/18/2013  HPI: Last check of DM shows HgbA1C was 5.9 off medications. Fasting BS between 110-150. Trying to watch diet. BP is too high--took decongestant today  Review of Systems  Constitutional: Negative for fever and chills.  Respiratory: Negative for shortness of breath.   Cardiovascular: Negative for leg swelling.  Gastrointestinal: Negative for abdominal pain.  Genitourinary: Negative for dysuria.  Musculoskeletal: Negative for back pain.      Objective:    BP 154/90  Pulse 91  Temp(Src) 98 F (36.7 C) (Oral)  Wt 222 lb (100.699 kg) Physical Exam  Constitutional: She is oriented to person, place, and time. She appears well-developed and well-nourished. No distress.  HENT:  Head: Normocephalic and atraumatic.  Eyes: No scleral icterus.  Neck: Neck supple.  Cardiovascular: Normal rate and regular rhythm.   No murmur heard. Pulmonary/Chest: Effort normal.  Abdominal: Soft. There is no tenderness.  Neurological: She is alert and oriented to person, place, and time.  Skin: Skin is warm. No rash noted.  Psychiatric: Her behavior is normal.        Assessment & Plan:  HYPERTENSION, BENIGN SYSTEMIC Took decongestants earlier today which may explain elevated BP.  ALLERGIC RHINITIS Working on it.  Using Zyrtec and Nasacort and decongestants prn.  OBESITY Continue to work on weight loss.  Type II or unspecified type diabetes mellitus without mention of complication, not stated as uncontrolled Continue to watch diet, and exercise.     Return in about 3 months (around 10/15/2013).

## 2013-07-27 ENCOUNTER — Ambulatory Visit (HOSPITAL_COMMUNITY): Payer: No Typology Code available for payment source

## 2013-08-07 ENCOUNTER — Ambulatory Visit: Payer: No Typology Code available for payment source

## 2013-10-03 ENCOUNTER — Other Ambulatory Visit: Payer: Self-pay | Admitting: Family Medicine

## 2013-10-03 ENCOUNTER — Ambulatory Visit
Admission: RE | Admit: 2013-10-03 | Discharge: 2013-10-03 | Disposition: A | Discharge status: Home / Self Care | Payer: No Typology Code available for payment source | Source: Ambulatory Visit | Attending: Family Medicine | Admitting: Family Medicine

## 2013-10-03 ENCOUNTER — Encounter: Payer: Self-pay | Admitting: Family Medicine

## 2013-10-03 ENCOUNTER — Ambulatory Visit (INDEPENDENT_AMBULATORY_CARE_PROVIDER_SITE_OTHER): Payer: Self-pay | Admitting: Family Medicine

## 2013-10-03 DIAGNOSIS — E119 Type 2 diabetes mellitus without complications: Secondary | ICD-10-CM

## 2013-10-03 DIAGNOSIS — I10 Essential (primary) hypertension: Secondary | ICD-10-CM

## 2013-10-03 LAB — POCT URINE PREGNANCY: Preg Test, Ur: NEGATIVE

## 2013-10-03 MED ORDER — LISINOPRIL-HYDROCHLOROTHIAZIDE 20-12.5 MG PO TABS: 2.0000 | ORAL_TABLET | Freq: Every day | ORAL | Status: DC

## 2013-10-03 MED ORDER — HYDROCODONE-ACETAMINOPHEN 5-325 MG PO TABS: 1.0000 | ORAL_TABLET | Freq: Four times a day (QID) | ORAL | Status: DC | PRN

## 2013-10-03 MED ORDER — CYCLOBENZAPRINE HCL 10 MG PO TABS: 10.0000 mg | ORAL_TABLET | Freq: Every day | ORAL | Status: DC

## 2013-10-03 NOTE — Assessment & Plan Note (Signed)
A: consistently elevated above goal P: double prinzide to 40/25 mg daily  PCP f/u in 2-3 weeks
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
## 2013-10-04 ENCOUNTER — Telehealth: Payer: Self-pay | Admitting: *Deleted

## 2013-10-04 NOTE — Telephone Encounter (Signed)
LMOVM informing that "your xrays are normal". Princella PellegriniJessica D Konrad Hoak

## 2013-10-04 NOTE — Telephone Encounter (Signed)
Message copied by Osborne OmanFLEEGER, JESSICA D on Thu Oct 04, 2013 10:29 AM ------      Message from: Dessa PhiFUNCHES, JOSALYN      Created: Wed Oct 03, 2013  5:32 PM       Please inform patient.       No rib fracture.       ------

## 2013-10-11 ENCOUNTER — Other Ambulatory Visit: Payer: Self-pay | Admitting: Family Medicine

## 2013-10-22 ENCOUNTER — Ambulatory Visit (INDEPENDENT_AMBULATORY_CARE_PROVIDER_SITE_OTHER): Payer: Self-pay | Admitting: Family Medicine

## 2013-10-22 ENCOUNTER — Encounter: Payer: Self-pay | Admitting: Family Medicine

## 2013-10-22 VITALS — BP 175/116 | HR 98 | Temp 98.6°F | Ht 62.0 in | Wt 228.0 lb

## 2013-10-22 DIAGNOSIS — I10 Essential (primary) hypertension: Secondary | ICD-10-CM

## 2013-10-22 DIAGNOSIS — J309 Allergic rhinitis, unspecified: Secondary | ICD-10-CM

## 2013-10-22 DIAGNOSIS — E119 Type 2 diabetes mellitus without complications: Secondary | ICD-10-CM

## 2013-10-22 LAB — POCT GLYCOSYLATED HEMOGLOBIN (HGB A1C): HEMOGLOBIN A1C: 5.9

## 2013-10-22 MED ORDER — ATENOLOL 50 MG PO TABS: 50.0000 mg | ORAL_TABLET | Freq: Every day | ORAL | Status: DC

## 2013-10-22 MED ORDER — LISINOPRIL-HYDROCHLOROTHIAZIDE 20-12.5 MG PO TABS: 2.0000 | ORAL_TABLET | Freq: Every day | ORAL | Status: DC
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
## 2013-10-22 NOTE — Assessment & Plan Note (Addendum)
BP is very high today--add b-blocker

## 2013-10-22 NOTE — Patient Instructions (Signed)

## 2013-10-22 NOTE — Assessment & Plan Note (Signed)
Continue meds and check HgbA1C

## 2013-10-22 NOTE — Assessment & Plan Note (Addendum)
Continues to have cough--may be related to ACE-I.  Otherwise allergies are improving.

## 2013-11-19 ENCOUNTER — Telehealth: Payer: Self-pay | Admitting: *Deleted

## 2013-11-19 ENCOUNTER — Other Ambulatory Visit: Payer: Self-pay | Admitting: Family Medicine

## 2013-11-19 DIAGNOSIS — I10 Essential (primary) hypertension: Secondary | ICD-10-CM

## 2013-11-19 NOTE — Telephone Encounter (Signed)
Received fax from [facility] stating they need a new Rx for Lisinopril 10 mg tablet; pt is taking 2 tabs daily.  Rx sent today was for 1 tab daily.  Clovis PuMartin, Tamika L, RN

## 2013-11-20 MED ORDER — LISINOPRIL-HYDROCHLOROTHIAZIDE 20-12.5 MG PO TABS: 2.0000 | ORAL_TABLET | Freq: Every day | ORAL | Status: DC

## 2013-11-20 NOTE — Addendum Note (Signed)
Addended by: Reva BoresPRATT, Treyven Lafauci S on: 11/20/2013 08:38 AM   Modules accepted: Orders

## 2014-01-01 ENCOUNTER — Ambulatory Visit (INDEPENDENT_AMBULATORY_CARE_PROVIDER_SITE_OTHER): Payer: Self-pay | Admitting: Family Medicine

## 2014-01-01 ENCOUNTER — Encounter: Payer: Self-pay | Admitting: Family Medicine

## 2014-01-01 VITALS — BP 154/85 | HR 60 | Temp 97.4°F | Resp 20 | Wt 227.0 lb

## 2014-01-01 DIAGNOSIS — J309 Allergic rhinitis, unspecified: Secondary | ICD-10-CM

## 2014-01-01 DIAGNOSIS — I10 Essential (primary) hypertension: Secondary | ICD-10-CM

## 2014-01-01 DIAGNOSIS — E119 Type 2 diabetes mellitus without complications: Secondary | ICD-10-CM

## 2014-01-01 DIAGNOSIS — J301 Allergic rhinitis due to pollen: Secondary | ICD-10-CM

## 2014-01-01 MED ORDER — TRIAMCINOLONE ACETONIDE 55 MCG/ACT NA AERO: 2.0000 | INHALATION_SPRAY | Freq: Every day | NASAL | Status: DC

## 2014-01-01 MED ORDER — LORATADINE 10 MG PO TABS: 10.0000 mg | ORAL_TABLET | Freq: Every day | ORAL | Status: DC

## 2014-01-01 NOTE — Assessment & Plan Note (Signed)
Has flared again.  Routine issue for her. Needs to get back on meds.  Should fix ear, eye, sinus pressure.  Anti-histamines and nasal steroids.  Please avoid decongestants as it affects your blood pressure adversely.

## 2014-01-01 NOTE — Patient Instructions (Signed)

## 2014-01-01 NOTE — Progress Notes (Signed)
    Subjective:    Patient ID: Carmen [name] is a 53 y.o. female presenting with URI  on 01/01/2014  HPI: Feels like worse 1-2 weeks, she has gotten worsening congestion and sinus pain.  Notes hoarseness, sneezing, productive cough. Notes post nasal drip.  Notes no fever, chills. Previously affected with allergic rhinitis.  Has been in pool and outside around grasses, which is not helping.  Having some ear pain and crusting at ear.  Has long standing h/o otomycosis and is using sweet oil prn.  Review of Systems  Constitutional: Negative for fever and chills.  HENT: Positive for rhinorrhea, sinus pressure and sneezing. Negative for sore throat.   Respiratory: Negative for shortness of breath.   Cardiovascular: Negative for chest pain.  Gastrointestinal: Negative for nausea, abdominal pain and diarrhea.  Genitourinary: Negative for dysuria.  Neurological: Positive for headaches.      Objective:    BP 154/85  Pulse 60  Temp(Src) 97.4 F (36.3 C) (Oral)  Resp 20  Wt 227 lb (102.967 kg)  SpO2 97% Physical Exam  Vitals reviewed. Constitutional: She appears well-developed and well-nourished.  HENT:  Head: Normocephalic and atraumatic.  Right Ear: There is drainage.  Left Ear: Tympanic membrane and external ear normal.  Mouth/Throat: Oropharynx is clear and moist. No oropharyngeal exudate.  Neck: Neck supple.  Cardiovascular: Normal rate and regular rhythm.   No murmur heard. Pulmonary/Chest: Effort normal and breath sounds normal.  Abdominal: Soft. There is no tenderness.  Neurological: She is alert.        Assessment & Plan:  ALLERGIC RHINITIS Has flared again.  Routine issue for her. Needs to get back on meds.  Should fix ear, eye, sinus pressure.  Anti-histamines and nasal steroids.  Please avoid decongestants as it affects your blood pressure adversely.  HYPERTENSION, BENIGN SYSTEMIC To bring medications next visit--for adjustment.  Type II or unspecified type  diabetes mellitus without mention of complication, not stated as uncontrolled ON no meds, but last A1C was normal.  Will re-address at next visit.  Needs foot and optho exam.     Return in about 3 months (around 04/03/2014).

## 2014-01-01 NOTE — Assessment & Plan Note (Signed)
ON no meds, but last A1C was normal.  Will re-address at next visit.  Needs foot and optho exam.

## 2014-01-01 NOTE — Assessment & Plan Note (Signed)
To bring medications next visit--for adjustment.

## 2014-01-16 ENCOUNTER — Ambulatory Visit (INDEPENDENT_AMBULATORY_CARE_PROVIDER_SITE_OTHER): Payer: Self-pay | Admitting: Family Medicine

## 2014-01-16 ENCOUNTER — Encounter: Payer: Self-pay | Admitting: Family Medicine

## 2014-01-16 VITALS — BP 159/83 | HR 71 | Temp 98.0°F | Ht 62.0 in | Wt 224.0 lb

## 2014-01-16 DIAGNOSIS — A499 Bacterial infection, unspecified: Secondary | ICD-10-CM

## 2014-01-16 DIAGNOSIS — H109 Unspecified conjunctivitis: Secondary | ICD-10-CM

## 2014-01-16 DIAGNOSIS — H1089 Other conjunctivitis: Secondary | ICD-10-CM

## 2014-01-16 DIAGNOSIS — B9689 Other specified bacterial agents as the cause of diseases classified elsewhere: Secondary | ICD-10-CM

## 2014-01-16 MED ORDER — POLYMYXIN B-TRIMETHOPRIM 10000-0.1 UNIT/ML-% OP SOLN: 1.0000 [drp] | OPHTHALMIC | Status: DC

## 2014-01-16 NOTE — Patient Instructions (Signed)

## 2014-01-16 NOTE — Progress Notes (Signed)
  Subjective:    Carmen [name] is a 53 y.o. female who presents for evaluation of discharge and tearing in the right eye. She has noticed the above symptoms for 3 weeks. Onset was gradual. Patient denies blurred vision, erythema, foreign body sensation, photophobia and visual field deficit. There is a history of allergies.  Was seen three weeks ago and at that time tx for allergic conjunctivitis.  Has seen waxing/waning improvement since that point up until about 3 days ago when she had worsening redness, purulent discharge in the AM, and pruritis of this eye.  She has used visine drops and pataday w/o improvement.   The following portions of the patient's history were reviewed and updated as appropriate: allergies, current medications, past family history, past medical history, past social history, past surgical history and problem list.  Review of Systems Pertinent items are noted in HPI.   Objective:    BP 159/83  Pulse 71  Temp(Src) 98 F (36.7 C) (Oral)  Ht 5\' 2"  (1.575 m)  Wt 224 lb (101.606 kg)  BMI 40.96 kg/m2      General: alert, cooperative and appears stated age  Eyes:  R Conjunctiva injected, PERRLA, EOMi B/L, Lacrimal duct clear   Vision: Not performed  Fluorescein:  not done     Assessment:    Acute conjunctivitis   Plan:    Discussed the diagnosis and proper care of conjunctivitis.  Stressed household Presenter, broadcastinghygiene. Antibiotics per orders.   F/U in 7 days if no improvement

## 2014-02-07 ENCOUNTER — Ambulatory Visit: Payer: No Typology Code available for payment source

## 2014-02-14 ENCOUNTER — Ambulatory Visit: Payer: Self-pay

## 2014-02-27 ENCOUNTER — Encounter: Payer: Self-pay | Admitting: Family Medicine

## 2014-02-27 ENCOUNTER — Ambulatory Visit (INDEPENDENT_AMBULATORY_CARE_PROVIDER_SITE_OTHER): Payer: Self-pay | Admitting: Family Medicine

## 2014-02-27 VITALS — BP 148/84 | HR 71 | Temp 98.1°F | Wt 226.0 lb

## 2014-02-27 DIAGNOSIS — I10 Essential (primary) hypertension: Secondary | ICD-10-CM

## 2014-02-27 DIAGNOSIS — J309 Allergic rhinitis, unspecified: Secondary | ICD-10-CM

## 2014-02-27 DIAGNOSIS — E119 Type 2 diabetes mellitus without complications: Secondary | ICD-10-CM

## 2014-02-27 LAB — POCT GLYCOSYLATED HEMOGLOBIN (HGB A1C): Hemoglobin A1C: 7.7

## 2014-02-27 NOTE — Assessment & Plan Note (Signed)
BP is better today. 

## 2014-02-27 NOTE — Patient Instructions (Signed)
Hypertension Hypertension, commonly called high blood pressure, is when the force of blood pumping through your arteries is too strong. Your arteries are the blood vessels that carry blood from your heart throughout your body. A blood pressure reading consists of a higher number over a lower number, such as 110/72. The higher number (systolic) is the pressure inside your arteries when your heart pumps. The lower number (diastolic) is the pressure inside your arteries when your heart relaxes. Ideally you want your blood pressure below 120/80. Hypertension forces your heart to work harder to pump blood. Your arteries may become narrow or stiff. Having hypertension puts you at risk for heart disease, stroke, and other problems.  RISK FACTORS Some risk factors for high blood pressure are controllable. Others are not.  Risk factors you cannot control include:   Race. You may be at higher risk if you are African American.  Age. Risk increases with age.  Gender. Men are at higher risk than women before age 45 years. After age 65, women are at higher risk than men. Risk factors you can control include:  Not getting enough exercise or physical activity.  Being overweight.  Getting too much fat, sugar, calories, or salt in your diet.  Drinking too much alcohol. SIGNS AND SYMPTOMS Hypertension does not usually cause signs or symptoms. Extremely high blood pressure (hypertensive crisis) may cause headache, anxiety, shortness of breath, and nosebleed. DIAGNOSIS  To check if you have hypertension, your health care provider will measure your blood pressure while you are seated, with your arm held at the level of your heart. It should be measured at least twice using the same arm. Certain conditions can cause a difference in blood pressure between your right and left arms. A blood pressure reading that is higher than normal on one occasion does not mean that you need treatment. If one blood pressure reading  is high, ask your health care provider about having it checked again. TREATMENT  Treating high blood pressure includes making lifestyle changes and possibly taking medicine. Living a healthy lifestyle can help lower high blood pressure. You may need to change some of your habits. Lifestyle changes may include:  Following the DASH diet. This diet is high in fruits, vegetables, and whole grains. It is low in salt, red meat, and added sugars.  Getting at least 2 hours of brisk physical activity every week.  Losing weight if necessary.  Not smoking.  Limiting alcoholic beverages.  Learning ways to reduce stress. If lifestyle changes are not enough to get your blood pressure under control, your health care provider may prescribe medicine. You may need to take more than one. Work closely with your health care provider to understand the risks and benefits. HOME CARE INSTRUCTIONS  Have your blood pressure rechecked as directed by your health care provider.   Take medicines only as directed by your health care provider. Follow the directions carefully. Blood pressure medicines must be taken as prescribed. The medicine does not work as well when you skip doses. Skipping doses also puts you at risk for problems.   Do not smoke.   Monitor your blood pressure at home as directed by your health care provider. SEEK MEDICAL CARE IF:   You think you are having a reaction to medicines taken.  You have recurrent headaches or feel dizzy.  You have swelling in your ankles.  You have trouble with your vision. SEEK IMMEDIATE MEDICAL CARE IF:  You develop a severe headache or confusion.    You have unusual weakness, numbness, or feel faint.  You have severe chest or abdominal pain.  You vomit repeatedly.  You have trouble breathing. MAKE SURE YOU:   Understand these instructions.  Will watch your condition.  Will get help right away if you are not doing well or get worse. Document  Released: 05/24/2005 Document Revised: 10/08/2013 Document Reviewed: 03/16/2013 ExitCare Patient Information 2015 [employer]. This information is not intended to replace advice given to you by your health care provider. Make sure you discuss any questions you have with your health care provider. Diabetes Mellitus and Food It is important for you to manage your blood sugar (glucose) level. Your blood glucose level can be greatly affected by what you eat. Eating healthier foods in the appropriate amounts throughout the day at about the same time each day will help you control your blood glucose level. It can also help slow or prevent worsening of your diabetes mellitus. Healthy eating may even help you improve the level of your blood pressure and reach or maintain a healthy weight.  HOW CAN FOOD AFFECT ME? Carbohydrates Carbohydrates affect your blood glucose level more than any other type of food. Your dietitian will help you determine how many carbohydrates to eat at each meal and teach you how to count carbohydrates. Counting carbohydrates is important to keep your blood glucose at a healthy level, especially if you are using insulin or taking certain medicines for diabetes mellitus. Alcohol Alcohol can cause sudden decreases in blood glucose (hypoglycemia), especially if you use insulin or take certain medicines for diabetes mellitus. Hypoglycemia can be a life-threatening condition. Symptoms of hypoglycemia (sleepiness, dizziness, and disorientation) are similar to symptoms of having too much alcohol.  If your health care provider has given you approval to drink alcohol, do so in moderation and use the following guidelines:  Women should not have more than one drink per day, and men should not have more than two drinks per day. One drink is equal to:  12 oz of beer.  5 oz of wine.  1 oz of hard liquor.  Do not drink on an empty stomach.  Keep yourself hydrated. Have water, diet soda, or  unsweetened iced tea.  Regular soda, juice, and other mixers might contain a lot of carbohydrates and should be counted. WHAT FOODS ARE NOT RECOMMENDED? As you make food choices, it is important to remember that all foods are not the same. Some foods have fewer nutrients per serving than other foods, even though they might have the same number of calories or carbohydrates. It is difficult to get your body what it needs when you eat foods with fewer nutrients. Examples of foods that you should avoid that are high in calories and carbohydrates but low in nutrients include:  Trans fats (most processed foods list trans fats on the Nutrition Facts label).  Regular soda.  Juice.  Candy.  Sweets, such as cake, pie, doughnuts, and cookies.  Fried foods. WHAT FOODS CAN I EAT? Have nutrient-rich foods, which will nourish your body and keep you healthy. The food you should eat also will depend on several factors, including:  The calories you need.  The medicines you take.  Your weight.  Your blood glucose level.  Your blood pressure level.  Your cholesterol level. You also should eat a variety of foods, including:  Protein, such as meat, poultry, fish, tofu, nuts, and seeds (lean animal proteins are best).  Fruits.  Vegetables.  Dairy products, such as   milk, cheese, and yogurt (low fat is best).  Breads, grains, pasta, cereal, rice, and beans.  Fats such as olive oil, trans fat-free margarine, canola oil, avocado, and olives. DOES EVERYONE WITH DIABETES MELLITUS HAVE THE SAME MEAL PLAN? Because every person with diabetes mellitus is different, there is not one meal plan that works for everyone. It is very important that you meet with a dietitian who will help you create a meal plan that is just right for you. Document Released: 02/18/2005 Document Revised: 05/29/2013 Document Reviewed: 04/20/2013 ExitCare Patient Information 2015 [employer]. This information is not intended  to replace advice given to you by your health care provider. Make sure you discuss any questions you have with your health care provider.  

## 2014-02-27 NOTE — Assessment & Plan Note (Signed)
Restart her metformin and amaryl--she has prescriptions already

## 2014-02-27 NOTE — Assessment & Plan Note (Signed)
Continued therapeutic support with meds during worsening time.

## 2014-02-27 NOTE — Progress Notes (Signed)
    Subjective:    Patient ID: Carmen [name] is a 53 y.o. female presenting with Diabetes  on 02/27/2014  HPI: Here for f/u. She is reports continued problem with her allergies. Takes nasal spray and Loratidine and sudafed if needed.  DM: Reports poor diet and check of BS shows 344.    HTN:  On 3 drug therapy taking 40 mg lisinopril and HCTZ at 50 mg and Atenolol 50 mg  Review of Systems  Constitutional: Negative for fever and chills.  Respiratory: Negative for shortness of breath.   Cardiovascular: Negative for chest pain.  Gastrointestinal: Negative for nausea, vomiting and abdominal pain.  Genitourinary: Negative for dysuria.  Skin: Negative for rash.      Objective:    BP 148/84  Pulse 71  Temp(Src) 98.1 F (36.7 C) (Oral)  Wt 226 lb (102.513 kg) Physical Exam  Constitutional: She is oriented to person, place, and time. She appears well-developed and well-nourished. No distress.  HENT:  Head: Normocephalic and atraumatic.  Eyes: No scleral icterus.  Neck: Neck supple.  Cardiovascular: Normal rate.   Pulmonary/Chest: Effort normal.  Abdominal: Soft.  Neurological: She is alert and oriented to person, place, and time.  Skin: Skin is warm and dry.  Psychiatric: She has a normal mood and affect.        Assessment & Plan:    Problem List Items Addressed This Visit     High   HYPERTENSION, BENIGN SYSTEMIC     BP is better today    Type II or unspecified type diabetes mellitus without mention of complication, not stated as uncontrolled - Primary     Restart her metformin and amaryl--she has prescriptions already    Relevant Orders      POCT A1C (Completed)     Medium   ALLERGIC RHINITIS     Continued therapeutic support with meds during worsening time.      Declines flu shot, TDaP, and pneumonia shot

## 2014-04-20 ENCOUNTER — Other Ambulatory Visit: Payer: Self-pay | Admitting: Family Medicine

## 2014-04-30 ENCOUNTER — Ambulatory Visit: Payer: No Typology Code available for payment source | Admitting: Family Medicine

## 2014-06-12 ENCOUNTER — Encounter: Payer: Self-pay | Admitting: Family Medicine

## 2014-06-12 ENCOUNTER — Ambulatory Visit (INDEPENDENT_AMBULATORY_CARE_PROVIDER_SITE_OTHER): Payer: Self-pay | Admitting: Family Medicine

## 2014-06-12 VITALS — BP 187/93 | HR 73 | Temp 97.9°F | Ht 62.0 in | Wt 231.0 lb

## 2014-06-12 DIAGNOSIS — E119 Type 2 diabetes mellitus without complications: Secondary | ICD-10-CM

## 2014-06-12 DIAGNOSIS — B373 Candidiasis of vulva and vagina: Secondary | ICD-10-CM

## 2014-06-12 DIAGNOSIS — I10 Essential (primary) hypertension: Secondary | ICD-10-CM

## 2014-06-12 DIAGNOSIS — B3731 Acute candidiasis of vulva and vagina: Secondary | ICD-10-CM

## 2014-06-12 LAB — POCT GLYCOSYLATED HEMOGLOBIN (HGB A1C): Hemoglobin A1C: 7.2

## 2014-06-12 MED ORDER — FLUCONAZOLE 150 MG PO TABS: 150.0000 mg | ORAL_TABLET | Freq: Every day | ORAL | Status: DC

## 2014-06-12 MED ORDER — GLIPIZIDE 5 MG PO TABS: 5.0000 mg | ORAL_TABLET | Freq: Two times a day (BID) | ORAL | Status: DC

## 2014-06-12 MED ORDER — HYDROCHLOROTHIAZIDE 25 MG PO TABS: 25.0000 mg | ORAL_TABLET | Freq: Every day | ORAL | Status: DC

## 2014-06-12 NOTE — Progress Notes (Signed)
    Subjective:    Patient ID: Carmen ComesFrances B Italiano is a 54 y.o. female presenting with Diabetes and Hypertension  on 06/12/2014  HPI: Reports that her life has a lot of drama. Has not taken BP meds x 2 wks, due to coughing. Taking her diabetes medication off and on.  She has not had any luck getting a job and was depressed.  Review of Systems  Constitutional: Negative for fever and chills.  Respiratory: Negative for shortness of breath.   Cardiovascular: Negative for chest pain.  Gastrointestinal: Negative for nausea, vomiting and abdominal pain.  Genitourinary: Negative for dysuria.  Skin: Negative for rash.      Objective:    BP 187/93 mmHg  Pulse 73  Temp(Src) 97.9 F (36.6 C) (Oral)  Ht 5\' 2"  (1.575 m)  Wt 231 lb (104.781 kg)  BMI 42.24 kg/m2 Physical Exam  Constitutional: She is oriented to person, place, and time. She appears well-developed and well-nourished. No distress.  HENT:  Head: Normocephalic and atraumatic.  Eyes: No scleral icterus.  Neck: Neck supple.  Cardiovascular: Normal rate.   Pulmonary/Chest: Effort normal.  Abdominal: Soft.  Neurological: She is alert and oriented to person, place, and time.  Skin: Skin is warm and dry.  Psychiatric: She has a normal mood and affect.   HgbA1C is 7.2     Assessment & Plan:   Problem List Items Addressed This Visit      High   HYPERTENSION, BENIGN SYSTEMIC    Very poorly controlled today--resume atenolol, stop lisinopril, HCTZ--consider addition of Diovan vs. CCB if pt. Can afford.    Relevant Medications      hydrochlorothiazide tablet   Diabetes mellitus without complication - Primary    Resume diet and exercise    Relevant Medications      glipiZIDE (GLUCOTROL) tablet   Other Relevant Orders      POCT A1C (Completed)    Other Visit Diagnoses    Vulval candidiasis        Relevant Medications       fluconazole (DIFLUCAN) tablet 150 mg        Return in about 3 months (around 09/11/2014).

## 2014-06-12 NOTE — Patient Instructions (Signed)
Diabetes Mellitus and Food It is important for you to manage your blood sugar (glucose) level. Your blood glucose level can be greatly affected by what you eat. Eating healthier foods in the appropriate amounts throughout the day at about the same time each day will help you control your blood glucose level. It can also help slow or prevent worsening of your diabetes mellitus. Healthy eating may even help you improve the level of your blood pressure and reach or maintain a healthy weight.  HOW CAN FOOD AFFECT ME? Carbohydrates Carbohydrates affect your blood glucose level more than any other type of food. Your dietitian will help you determine how many carbohydrates to eat at each meal and teach you how to count carbohydrates. Counting carbohydrates is important to keep your blood glucose at a healthy level, especially if you are using insulin or taking certain medicines for diabetes mellitus. Alcohol Alcohol can cause sudden decreases in blood glucose (hypoglycemia), especially if you use insulin or take certain medicines for diabetes mellitus. Hypoglycemia can be a life-threatening condition. Symptoms of hypoglycemia (sleepiness, dizziness, and disorientation) are similar to symptoms of having too much alcohol.  If your health care provider has given you approval to drink alcohol, do so in moderation and use the following guidelines:  Women should not have more than one drink per day, and men should not have more than two drinks per day. One drink is equal to:  12 oz of beer.  5 oz of wine.  1 oz of hard liquor.  Do not drink on an empty stomach.  Keep yourself hydrated. Have water, diet soda, or unsweetened iced tea.  Regular soda, juice, and other mixers might contain a lot of carbohydrates and should be counted. WHAT FOODS ARE NOT RECOMMENDED? As you make food choices, it is important to remember that all foods are not the same. Some foods have fewer nutrients per serving than other  foods, even though they might have the same number of calories or carbohydrates. It is difficult to get your body what it needs when you eat foods with fewer nutrients. Examples of foods that you should avoid that are high in calories and carbohydrates but low in nutrients include:  Trans fats (most processed foods list trans fats on the Nutrition Facts label).  Regular soda.  Juice.  Candy.  Sweets, such as cake, pie, doughnuts, and cookies.  Fried foods. WHAT FOODS CAN I EAT? Have nutrient-rich foods, which will nourish your body and keep you healthy. The food you should eat also will depend on several factors, including:  The calories you need.  The medicines you take.  Your weight.  Your blood glucose level.  Your blood pressure level.  Your cholesterol level. You also should eat a variety of foods, including:  Protein, such as meat, poultry, fish, tofu, nuts, and seeds (lean animal proteins are best).  Fruits.  Vegetables.  Dairy products, such as milk, cheese, and yogurt (low fat is best).  Breads, grains, pasta, cereal, rice, and beans.  Fats such as olive oil, trans fat-free margarine, canola oil, avocado, and olives. DOES EVERYONE WITH DIABETES MELLITUS HAVE THE SAME MEAL PLAN? Because every person with diabetes mellitus is different, there is not one meal plan that works for everyone. It is very important that you meet with a dietitian who will help you create a meal plan that is just right for you. Document Released: 02/18/2005 Document Revised: 05/29/2013 Document Reviewed: 04/20/2013 [employer] Patient Information 2015 [employer]. This   information is not intended to replace advice given to you by your health care provider. Make sure you discuss any questions you have with your health care provider. Hypertension Hypertension, commonly called high blood pressure, is when the force of blood pumping through your arteries is too strong. Your arteries are the  blood vessels that carry blood from your heart throughout your body. A blood pressure reading consists of a higher number over a lower number, such as 110/72. The higher number (systolic) is the pressure inside your arteries when your heart pumps. The lower number (diastolic) is the pressure inside your arteries when your heart relaxes. Ideally you want your blood pressure below 120/80. Hypertension forces your heart to work harder to pump blood. Your arteries may become narrow or stiff. Having hypertension puts you at risk for heart disease, stroke, and other problems.  RISK FACTORS Some risk factors for high blood pressure are controllable. Others are not.  Risk factors you cannot control include:   Race. You may be at higher risk if you are African American.  Age. Risk increases with age.  Gender. Men are at higher risk than women before age 45 years. After age 65, women are at higher risk than men. Risk factors you can control include:  Not getting enough exercise or physical activity.  Being overweight.  Getting too much fat, sugar, calories, or salt in your diet.  Drinking too much alcohol. SIGNS AND SYMPTOMS Hypertension does not usually cause signs or symptoms. Extremely high blood pressure (hypertensive crisis) may cause headache, anxiety, shortness of breath, and nosebleed. DIAGNOSIS  To check if you have hypertension, your health care provider will measure your blood pressure while you are seated, with your arm held at the level of your heart. It should be measured at least twice using the same arm. Certain conditions can cause a difference in blood pressure between your right and left arms. A blood pressure reading that is higher than normal on one occasion does not mean that you need treatment. If one blood pressure reading is high, ask your health care provider about having it checked again. TREATMENT  Treating high blood pressure includes making lifestyle changes and possibly  taking medicine. Living a healthy lifestyle can help lower high blood pressure. You may need to change some of your habits. Lifestyle changes may include:  Following the DASH diet. This diet is high in fruits, vegetables, and whole grains. It is low in salt, red meat, and added sugars.  Getting at least 2 hours of brisk physical activity every week.  Losing weight if necessary.  Not smoking.  Limiting alcoholic beverages.  Learning ways to reduce stress. If lifestyle changes are not enough to get your blood pressure under control, your health care provider may prescribe medicine. You may need to take more than one. Work closely with your health care provider to understand the risks and benefits. HOME CARE INSTRUCTIONS  Have your blood pressure rechecked as directed by your health care provider.   Take medicines only as directed by your health care provider. Follow the directions carefully. Blood pressure medicines must be taken as prescribed. The medicine does not work as well when you skip doses. Skipping doses also puts you at risk for problems.   Do not smoke.   Monitor your blood pressure at home as directed by your health care provider. SEEK MEDICAL CARE IF:   You think you are having a reaction to medicines taken.  You have recurrent headaches or   feel dizzy.  You have swelling in your ankles.  You have trouble with your vision. SEEK IMMEDIATE MEDICAL CARE IF:  You develop a severe headache or confusion.  You have unusual weakness, numbness, or feel faint.  You have severe chest or abdominal pain.  You vomit repeatedly.  You have trouble breathing. MAKE SURE YOU:   Understand these instructions.  Will watch your condition.  Will get help right away if you are not doing well or get worse. Document Released: 05/24/2005 Document Revised: 10/08/2013 Document Reviewed: 03/16/2013 Exit[employer] Patient Information 2015 [employer]. This information is not  intended to replace advice given to you by your health care provider. Make sure you discuss any questions you have with your health care provider.  

## 2014-06-12 NOTE — Assessment & Plan Note (Signed)
Resume diet and exercise

## 2014-06-12 NOTE — Assessment & Plan Note (Signed)
Very poorly controlled today--resume atenolol, stop lisinopril, HCTZ--consider addition of Diovan vs. CCB if pt. Can afford.

## 2014-07-01 ENCOUNTER — Encounter: Payer: Self-pay | Admitting: Family Medicine

## 2014-07-01 ENCOUNTER — Ambulatory Visit (INDEPENDENT_AMBULATORY_CARE_PROVIDER_SITE_OTHER): Payer: Self-pay | Admitting: Family Medicine

## 2014-07-01 VITALS — BP 165/85 | HR 61 | Temp 98.5°F | Ht 62.0 in | Wt 228.0 lb

## 2014-07-01 DIAGNOSIS — I10 Essential (primary) hypertension: Secondary | ICD-10-CM

## 2014-07-01 DIAGNOSIS — R202 Paresthesia of skin: Principal | ICD-10-CM

## 2014-07-01 DIAGNOSIS — R200 Anesthesia of skin: Secondary | ICD-10-CM

## 2014-07-01 DIAGNOSIS — R8299 Other abnormal findings in urine: Secondary | ICD-10-CM

## 2014-07-01 DIAGNOSIS — R82998 Other abnormal findings in urine: Secondary | ICD-10-CM

## 2014-07-01 LAB — POCT URINALYSIS DIPSTICK
Bilirubin, UA: NEGATIVE
Blood, UA: NEGATIVE
Glucose, UA: NEGATIVE
Ketones, UA: NEGATIVE
Leukocytes, UA: NEGATIVE
Nitrite, UA: NEGATIVE
Protein, UA: NEGATIVE
Spec Grav, UA: >=1.03
Urobilinogen, UA: 0.2
pH, UA: 5.5

## 2014-07-01 MED ORDER — LOSARTAN POTASSIUM 25 MG PO TABS: 25.0000 mg | ORAL_TABLET | Freq: Every day | ORAL | Status: DC

## 2014-07-01 MED ORDER — ASPIRIN 81 MG PO CHEW: 81.0000 mg | CHEWABLE_TABLET | Freq: Every day | ORAL | Status: DC

## 2014-07-01 NOTE — Assessment & Plan Note (Signed)
Unclear etiology--will check head CT and stress testing. Begin ASA daily.

## 2014-07-01 NOTE — Progress Notes (Signed)
    Subjective:    Patient ID: Carmen [name] is a 54 y.o. female presenting with Follow-up  on 07/01/2014  HPI: Reports foam in urine is still there.  Notes fluconazole did not work fully last time and she notes it did not work this time.  She still has foam in urine. Notes tingling in face and left arm and leg on the left. It will some and go.  Not exertionally related.  No associated diaphoresis or nausea or chest pain. Reports BS are 170's-200's. Has taken her glucotrol and when under stress. Then resumed metformin and glimepramide.--notes side effects of diarrhea. Cough is much improved now that lisinipril has been discontinued. Will need 3rd agent.  Review of Systems    Objective:    BP 165/85 mmHg  Pulse 61  Temp(Src) 98.5 F (36.9 C) (Oral)  Ht 5\' 2"  (1.575 m)  Wt 228 lb (103.42 kg)  BMI 41.69 kg/m2 Physical Exam  Constitutional: She is oriented to person, place, and time. She appears well-developed and well-nourished. No distress.  HENT:  Head: Normocephalic and atraumatic.  Eyes: No scleral icterus.  Neck: Neck supple.  Cardiovascular: Normal rate.   Pulmonary/Chest: Effort normal.  Abdominal: Soft.  Neurological: She is alert and oriented to person, place, and time.  Skin: Skin is warm and dry.  Psychiatric: She has a normal mood and affect.  Vitals reviewed.  Urinalysis: WNL - except for spec grav > 1.030     Assessment & Plan:   Problem List Items Addressed This Visit      High   HYPERTENSION, BENIGN SYSTEMIC    Will add Losartan for kidney protection with diabetes and for improved control.      Relevant Medications   losartan (COZAAR) tablet   aspirin chewable tablet 81 mg     Unprioritized   Numbness and tingling in left arm - Primary    Unclear etiology--will check head CT and stress testing. Begin ASA daily.      Relevant Medications   aspirin chewable tablet 81 mg   Other Relevant Orders   CT Head Wo Contrast   Exercise tolerance  test    Other Visit Diagnoses    Foamy urine        Likely needs to improve her hydration.    Relevant Orders    Urinalysis Dipstick (Completed)        Return in about 3 months (around 09/30/2014).

## 2014-07-01 NOTE — Patient Instructions (Addendum)
Cardiopulmonary Stress Test A cardiopulmonary stress test is an exercise test for your heart and lungs. It measures how well your body uses oxygen. This test is done to:  Look at how well your lungs work.  Look at why you may have trouble breathing.  Carmen [name] your ability to exercise.  Make an assessment for disability. BEFORE THE TEST  Ask your doctor what medicines you can or cannot take before the test.  Do not eat or drink anything 4 hours before the test or as told by your doctor.  Do not eat or drink foods with caffeine for 24 hours before the test.  Do not smoke on the day of your test.  Bring your inhaler to the test if you have one.  Wear comfortable clothes and shoes to exercise in. THE TEST  Sticky patches (electrodes) will be attached to your chest. These patches help to monitor your heart during the test.  A headpiece will be put on your face. This holds a breathing tube that you will breath through during the test. A nose clip will be attached to your nose.  You will walk on a treadmill or use a stationary bike. How hard and how long you exercise depends on:  Your ability to exercise.  Your blood pressure, breathing, and heart rate (vital signs).  If you start to have chest pain.  If you become short of breath. AFTER THE TEST You will be taken to a rest area. Your blood pressure, breathing, and heart rate will be monitored. Once these are normal, you can go home.  Finding out the results of your test Ask when your test results will be ready. Make sure you get your test results. Document Released: 05/12/2009 Document Revised: 10/08/2013 Document Reviewed: 05/12/2009 [facility] Patient Information 2015 [place], [place]. This information is not intended to replace advice given to you by your health care provider. Make sure you discuss any questions you have with your health care provider. Hypertension Hypertension, commonly called high blood pressure, is when the  force of blood pumping through your arteries is too strong. Your arteries are the blood vessels that carry blood from your heart throughout your body. A blood pressure reading consists of a higher number over a lower number, such as 110/72. The higher number (systolic) is the pressure inside your arteries when your heart pumps. The lower number (diastolic) is the pressure inside your arteries when your heart relaxes. Ideally you want your blood pressure below 120/80. Hypertension forces your heart to work harder to pump blood. Your arteries may become narrow or stiff. Having hypertension puts you at risk for heart disease, stroke, and other problems.  RISK FACTORS Some risk factors for high blood pressure are controllable. Others are not.  Risk factors you cannot control include:   Race. You may be at higher risk if you are African American.  Age. Risk increases with age.  Gender. Men are at higher risk than women before age 80 years. After age 66, women are at higher risk than men. Risk factors you can control include:  Not getting enough exercise or physical activity.  Being overweight.  Getting too much fat, sugar, calories, or salt in your diet.  Drinking too much alcohol. SIGNS AND SYMPTOMS Hypertension does not usually cause signs or symptoms. Extremely high blood pressure (hypertensive crisis) may cause headache, anxiety, shortness of breath, and nosebleed. DIAGNOSIS  To check if you have hypertension, your health care provider will measure your blood pressure while you  are seated, with your arm held at the level of your heart. It should be measured at least twice using the same arm. Certain conditions can cause a difference in blood pressure between your right and left arms. A blood pressure reading that is higher than normal on one occasion does not mean that you need treatment. If one blood pressure reading is high, ask your health care provider about having it checked  again. TREATMENT  Treating high blood pressure includes making lifestyle changes and possibly taking medicine. Living a healthy lifestyle can help lower high blood pressure. You may need to change some of your habits. Lifestyle changes may include:  Following the DASH diet. This diet is high in fruits, vegetables, and whole grains. It is low in salt, red meat, and added sugars.  Getting at least 2 hours of brisk physical activity every week.  Losing weight if necessary.  Not smoking.  Limiting alcoholic beverages.  Learning ways to reduce stress. If lifestyle changes are not enough to get your blood pressure under control, your health care provider may prescribe medicine. You may need to take more than one. Work closely with your health care provider to understand the risks and benefits. HOME CARE INSTRUCTIONS  Have your blood pressure rechecked as directed by your health care provider.   Take medicines only as directed by your health care provider. Follow the directions carefully. Blood pressure medicines must be taken as prescribed. The medicine does not work as well when you skip doses. Skipping doses also puts you at risk for problems.   Do not smoke.   Monitor your blood pressure at home as directed by your health care provider. SEEK MEDICAL CARE IF:   You think you are having a reaction to medicines taken.  You have recurrent headaches or feel dizzy.  You have swelling in your ankles.  You have trouble with your vision. SEEK IMMEDIATE MEDICAL CARE IF:  You develop a severe headache or confusion.  You have unusual weakness, numbness, or feel faint.  You have severe chest or abdominal pain.  You vomit repeatedly.  You have trouble breathing. MAKE SURE YOU:   Understand these instructions.  Will watch your condition.  Will get help right away if you are not doing well or get worse. Document Released: 05/24/2005 Document Revised: 10/08/2013 Document  Reviewed: 03/16/2013 [facility] Patient Information 2015 [place]. This information is not intended to replace advice given to you by your health care provider. Make sure you discuss any questions you have with your health care provider. Dehydration, Adult Dehydration is when you lose more fluids from the body than you take in. Vital organs like the kidneys, brain, and heart cannot function without a proper amount of fluids and salt. Any loss of fluids from the body can cause dehydration.  CAUSES   Vomiting.  Diarrhea.  Excessive sweating.  Excessive urine output.  Fever. SYMPTOMS  Mild dehydration  Thirst.  Dry lips.  Slightly dry mouth. Moderate dehydration  Very dry mouth.  Sunken eyes.  Skin does not bounce back quickly when lightly pinched and released.  Dark urine and decreased urine production.  Decreased tear production.  Headache. Severe dehydration  Very dry mouth.  Extreme thirst.  Rapid, weak pulse (more than 100 beats per minute at rest).  Cold hands and feet.  Not able to sweat in spite of heat and temperature.  Rapid breathing.  Blue lips.  Confusion and lethargy.  Difficulty being awakened.  Minimal urine production.  No tears. DIAGNOSIS  Your caregiver will diagnose dehydration based on your symptoms and your exam. Blood and urine tests will help confirm the diagnosis. The diagnostic evaluation should also identify the cause of dehydration. TREATMENT  Treatment of mild or moderate dehydration can often be done at home by increasing the amount of fluids that you drink. It is best to drink small amounts of fluid more often. Drinking too much at one time can make vomiting worse. Refer to the home care instructions below. Severe dehydration needs to be treated at the hospital where you will probably be given intravenous (IV) fluids that contain water and electrolytes. HOME CARE INSTRUCTIONS   Ask your caregiver about specific  rehydration instructions.  Drink enough fluids to keep your urine clear or pale yellow.  Drink small amounts frequently if you have nausea and vomiting.  Eat as you normally do.  Avoid:  Foods or drinks high in sugar.  Carbonated drinks.  Juice.  Extremely hot or cold fluids.  Drinks with caffeine.  Fatty, greasy foods.  Alcohol.  Tobacco.  Overeating.  Gelatin desserts.  Wash your hands well to avoid spreading bacteria and viruses.  Only take over-the-counter or prescription medicines for pain, discomfort, or fever as directed by your caregiver.  Ask your caregiver if you should continue all prescribed and over-the-counter medicines.  Keep all follow-up appointments with your caregiver. SEEK MEDICAL CARE IF:  You have abdominal pain and it increases or stays in one area (localizes).  You have a rash, stiff neck, or severe headache.  You are irritable, sleepy, or difficult to awaken.  You are weak, dizzy, or extremely thirsty. SEEK IMMEDIATE MEDICAL CARE IF:   You are unable to keep fluids down or you get worse despite treatment.  You have frequent episodes of vomiting or diarrhea.  You have blood or green matter (bile) in your vomit.  You have blood in your stool or your stool looks black and tarry.  You have not urinated in 6 to 8 hours, or you have only urinated a small amount of very dark urine.  You have a fever.  You faint. MAKE SURE YOU:   Understand these instructions.  Will watch your condition.  Will get help right away if you are not doing well or get worse. Document Released: 05/24/2005 Document Revised: 08/16/2011 Document Reviewed: 01/11/2011 [facility] Patient Information 2015 [place], [place]. This information is not intended to replace advice given to you by your health care provider. Make sure you discuss any questions you have with your health care provider.

## 2014-07-01 NOTE — Assessment & Plan Note (Signed)
Will add Losartan for kidney protection with diabetes and for improved control.

## 2014-07-04 ENCOUNTER — Ambulatory Visit (HOSPITAL_COMMUNITY)
Admission: RE | Admit: 2014-07-04 | Discharge: 2014-07-04 | Disposition: A | Discharge status: Home / Self Care | Payer: Self-pay | Source: Ambulatory Visit | Attending: Family Medicine | Admitting: Family Medicine

## 2014-07-04 DIAGNOSIS — I10 Essential (primary) hypertension: Secondary | ICD-10-CM | POA: Insufficient documentation

## 2014-07-04 DIAGNOSIS — J449 Chronic obstructive pulmonary disease, unspecified: Secondary | ICD-10-CM | POA: Insufficient documentation

## 2014-07-04 DIAGNOSIS — E119 Type 2 diabetes mellitus without complications: Secondary | ICD-10-CM | POA: Insufficient documentation

## 2014-07-04 DIAGNOSIS — Z87891 Personal history of nicotine dependence: Secondary | ICD-10-CM | POA: Insufficient documentation

## 2014-07-04 DIAGNOSIS — R200 Anesthesia of skin: Secondary | ICD-10-CM | POA: Insufficient documentation

## 2014-07-04 DIAGNOSIS — R202 Paresthesia of skin: Secondary | ICD-10-CM

## 2014-07-05 ENCOUNTER — Telehealth: Payer: Self-pay | Admitting: Family Medicine

## 2014-07-05 NOTE — Telephone Encounter (Signed)
Please call Ms. Eley before 2:00 for results of CT scan

## 2014-07-08 NOTE — Telephone Encounter (Signed)
Head CT is essentially normal except some possible thick bones which might require some blood work at her next visit. We may need a few more x-rays (skeletal series) as well, but nothing to explain her symptoms was found. We will continue the work-up.

## 2014-07-08 NOTE — Telephone Encounter (Signed)
Message delivered

## 2014-07-17 ENCOUNTER — Telehealth (HOSPITAL_COMMUNITY): Payer: Self-pay

## 2014-07-17 NOTE — Telephone Encounter (Signed)
Encounter complete. 

## 2014-07-19 ENCOUNTER — Ambulatory Visit (HOSPITAL_COMMUNITY)
Admission: RE | Admit: 2014-07-19 | Discharge: 2014-07-19 | Disposition: A | Discharge status: Home / Self Care | Payer: No Typology Code available for payment source | Source: Ambulatory Visit | Attending: Family Medicine | Admitting: Family Medicine

## 2014-07-19 DIAGNOSIS — I10 Essential (primary) hypertension: Secondary | ICD-10-CM | POA: Insufficient documentation

## 2014-07-19 DIAGNOSIS — R200 Anesthesia of skin: Secondary | ICD-10-CM | POA: Insufficient documentation

## 2014-07-19 DIAGNOSIS — R202 Paresthesia of skin: Secondary | ICD-10-CM | POA: Insufficient documentation

## 2014-07-19 DIAGNOSIS — R000 Tachycardia, unspecified: Secondary | ICD-10-CM | POA: Insufficient documentation

## 2014-07-19 NOTE — Procedures (Signed)
Exercise Treadmill Test  Pre-Exercise Testing Evaluation Rhythm: sinus tachycardia                  Test  Exercise Tolerance Test Ordering ZO:[id number]:Tanya Shawnie PonsPratt, MD Interpreting MD:   Unique Test No: 1 Treadmill:  1  Indication for ETT: Tingling and Nmbness In Left Arm, HTN  Contraindication to ETT: No   Stress Modality: exercise - treadmill  Cardiac Imaging Performed: non   Protocol: standard Bruce - maximal  Max BP:  154/87  Max MPHR (bpm):  168 85% MPR (bpm):  142  MPHR obtained (bpm):  155 % MPHR obtained: 92  Reached 85% MPHR (min:sec):  3:55 Total Exercise Time (min-sec):  6:00  Workload in METS:  7.0 Borg Scale:   Reason ETT Terminated:  fatigue    ST Segment Analysis At Rest: normal ST segments - no evidence of significant ST depression With Exercise: no evidence of significant ST depression  Other Information Arrhythmia:  No Angina during ETT:  absent (0) Quality of ETT:  diagnostic  ETT Interpretation:  normal - no evidence of ischemia by ST analysis  Comments: Nl GXT  Recommendations: Follow up with Dr. Shawnie PonsPratt

## 2014-07-30 ENCOUNTER — Encounter: Payer: Self-pay | Admitting: Family Medicine

## 2014-07-30 ENCOUNTER — Encounter: Payer: Self-pay | Admitting: *Deleted

## 2014-07-30 ENCOUNTER — Ambulatory Visit (INDEPENDENT_AMBULATORY_CARE_PROVIDER_SITE_OTHER): Payer: No Typology Code available for payment source | Admitting: Family Medicine

## 2014-07-30 VITALS — BP 134/64 | HR 72 | Temp 98.3°F | Wt 226.0 lb

## 2014-07-30 DIAGNOSIS — E119 Type 2 diabetes mellitus without complications: Secondary | ICD-10-CM

## 2014-07-30 DIAGNOSIS — I10 Essential (primary) hypertension: Secondary | ICD-10-CM

## 2014-07-30 DIAGNOSIS — Q782 Osteopetrosis: Secondary | ICD-10-CM

## 2014-07-30 DIAGNOSIS — R202 Paresthesia of skin: Secondary | ICD-10-CM

## 2014-07-30 DIAGNOSIS — R200 Anesthesia of skin: Secondary | ICD-10-CM

## 2014-07-30 NOTE — Assessment & Plan Note (Signed)
No diagnosis found on CT or with stress test.

## 2014-07-30 NOTE — Patient Instructions (Signed)
Diabetes and Exercise Exercising regularly is important. It is not just about losing weight. It has many health benefits, such as:  Improving your overall fitness, flexibility, and endurance.  Increasing your bone density.  Helping with weight control.  Decreasing your body fat.  Increasing your muscle strength.  Reducing stress and tension.  Improving your overall health. People with diabetes who exercise gain additional benefits because exercise:  Reduces appetite.  Improves the body's use of blood sugar (glucose).  Helps lower or control blood glucose.  Decreases blood pressure.  Helps control blood lipids (such as cholesterol and triglycerides).  Improves the body's use of the hormone insulin by:  Increasing the body's insulin sensitivity.  Reducing the body's insulin needs.  Decreases the risk for heart disease because exercising:  Lowers cholesterol and triglycerides levels.  Increases the levels of good cholesterol (such as high-density lipoproteins [HDL]) in the body.  Lowers blood glucose levels. YOUR ACTIVITY PLAN  Choose an activity that you enjoy and set realistic goals. Your health care provider or diabetes educator can help you make an activity plan that works for you. Exercise regularly as directed by your health care provider. This includes:  Performing resistance training twice a week such as push-ups, sit-ups, lifting weights, or using resistance bands.  Performing 150 minutes of cardio exercises each week such as walking, running, or playing sports.  Staying active and spending no more than 90 minutes at one time being inactive. Even short bursts of exercise are good for you. Three 10-minute sessions spread throughout the day are just as beneficial as a single 30-minute session. Some exercise ideas include:  Taking the dog for a walk.  Taking the stairs instead of the elevator.  Dancing to your favorite song.  Doing an exercise  video.  Doing your favorite exercise with a friend. RECOMMENDATIONS FOR EXERCISING WITH TYPE 1 OR TYPE 2 DIABETES   Check your blood glucose before exercising. If blood glucose levels are greater than 240 mg/dL, check for urine ketones. Do not exercise if ketones are present.  Avoid injecting insulin into areas of the body that are going to be exercised. For example, avoid injecting insulin into:  The arms when playing tennis.  The legs when jogging.  Keep a record of:  Food intake before and after you exercise.  Expected peak times of insulin action.  Blood glucose levels before and after you exercise.  The type and amount of exercise you have done.  Review your records with your health care provider. Your health care provider will help you to develop guidelines for adjusting food intake and insulin amounts before and after exercising.  If you take insulin or oral hypoglycemic agents, watch for signs and symptoms of hypoglycemia. They include:  Dizziness.  Shaking.  Sweating.  Chills.  Confusion.  Drink plenty of water while you exercise to prevent dehydration or heat stroke. Body water is lost during exercise and must be replaced.  Talk to your health care provider before starting an exercise program to make sure it is safe for you. Remember, almost any type of activity is better than none. Document Released: 08/14/2003 Document Revised: 10/08/2013 Document Reviewed: 10/31/2012 ExitCare Patient Information 2015 [employer]. This information is not intended to replace advice given to you by your health care provider. Make sure you discuss any questions you have with your health care provider.  

## 2014-07-30 NOTE — Assessment & Plan Note (Signed)
Check CMP.  ?

## 2014-07-30 NOTE — Progress Notes (Signed)
    Subjective:    Patient ID: Carmen [name] is a 54 y.o. female presenting with discuss results  on 07/30/2014  HPI: Patient returns following CT of the head and stress testing for numbness in the arm.  CT showed no acute abnormalities except possible osteosclerosis. Stress test was normal.  Review of Systems  Constitutional: Negative for fever and chills.  Respiratory: Negative for shortness of breath.   Cardiovascular: Negative for chest pain.  Gastrointestinal: Negative for nausea, vomiting and abdominal pain.  Genitourinary: Negative for dysuria.  Skin: Negative for rash.      Objective:    BP 134/64 mmHg  Pulse 72  Temp(Src) 98.3 F (36.8 C) (Oral)  Wt 226 lb (102.513 kg) Physical Exam  Constitutional: She is oriented to person, place, and time. She appears well-developed and well-nourished. No distress.  HENT:  Head: Normocephalic and atraumatic.  Eyes: No scleral icterus.  Neck: Neck supple.  Cardiovascular: Normal rate.   Pulmonary/Chest: Effort normal.  Abdominal: Soft.  Neurological: She is alert and oriented to person, place, and time.  Skin: Skin is warm and dry.  Psychiatric: She has a normal mood and affect.        Assessment & Plan:   Problem List Items Addressed This Visit      High   HYPERTENSION, BENIGN SYSTEMIC    Much better today      Diabetes mellitus without complication - Primary    Check CMP       Relevant Orders   Comprehensive metabolic panel   CBC     Unprioritized   Numbness and tingling in left arm    No diagnosis found on CT or with stress test.      Osteosclerosis   Relevant Orders   Vit D  25 hydroxy (rtn osteoporosis monitoring)   PTH, Intact and Calcium   TSH       Return in about 3 months (around 10/28/2014) for a follow-up.

## 2014-07-30 NOTE — Assessment & Plan Note (Signed)
Much better today

## 2014-08-15 ENCOUNTER — Other Ambulatory Visit: Payer: Self-pay

## 2014-08-15 ENCOUNTER — Ambulatory Visit: Payer: No Typology Code available for payment source

## 2014-08-27 ENCOUNTER — Ambulatory Visit: Payer: Self-pay

## 2014-08-27 ENCOUNTER — Other Ambulatory Visit: Payer: Self-pay

## 2014-08-27 DIAGNOSIS — I10 Essential (primary) hypertension: Secondary | ICD-10-CM

## 2014-08-27 DIAGNOSIS — Q782 Osteopetrosis: Secondary | ICD-10-CM

## 2014-08-27 DIAGNOSIS — E119 Type 2 diabetes mellitus without complications: Secondary | ICD-10-CM

## 2014-08-27 NOTE — Progress Notes (Signed)
[facility] phlebotomist drew:  CBC, CMET, TSH,  LIPID,  VIT D 25 OH,  PTH intact with calcium,  Mg

## 2014-08-28 ENCOUNTER — Encounter: Payer: Self-pay | Admitting: *Deleted

## 2014-08-28 ENCOUNTER — Telehealth: Payer: Self-pay | Admitting: *Deleted

## 2014-08-28 LAB — COMPREHENSIVE METABOLIC PANEL
ALBUMIN: 4 g/dL (ref 3.5–5.2)
ALT: 25 U/L (ref 0–35)
AST: 21 U/L (ref 0–37)
Alkaline Phosphatase: 67 U/L (ref 39–117)
BUN: 12 mg/dL (ref 6–23)
CALCIUM: 8.6 mg/dL (ref 8.4–10.5)
CHLORIDE: 101 meq/L (ref 96–112)
CO2: 32 mEq/L (ref 19–32)
Creat: 0.71 mg/dL (ref 0.50–1.10)
Glucose, Bld: 104 mg/dL — ABNORMAL HIGH (ref 70–99)
POTASSIUM: 3.5 meq/L (ref 3.5–5.3)
SODIUM: 140 meq/L (ref 135–145)
TOTAL PROTEIN: 6.4 g/dL (ref 6.0–8.3)
Total Bilirubin: 0.4 mg/dL (ref 0.2–1.2)

## 2014-08-28 LAB — CBC
HCT: 38.9 % (ref 36.0–46.0)
HEMOGLOBIN: 13.5 g/dL (ref 12.0–15.0)
MCH: 29.8 pg (ref 26.0–34.0)
MCHC: 34.7 g/dL (ref 30.0–36.0)
MCV: 85.9 fL (ref 78.0–100.0)
MPV: 10.5 fL (ref 8.6–12.4)
PLATELETS: 250 10*3/uL (ref 150–400)
RBC: 4.53 MIL/uL (ref 3.87–5.11)
RDW: 14.3 % (ref 11.5–15.5)
WBC: 8.7 10*3/uL (ref 4.0–10.5)

## 2014-08-28 LAB — PTH, INTACT AND CALCIUM
CALCIUM: 8.6 mg/dL (ref 8.4–10.5)
PTH: 45 pg/mL (ref 14–64)

## 2014-08-28 LAB — LIPID PANEL
Cholesterol: 169 mg/dL (ref 0–200)
HDL: 46 mg/dL (ref >=46)
LDL Cholesterol: 76 mg/dL (ref 0–99)
Total CHOL/HDL Ratio: 3.7 Ratio
Triglycerides: 234 mg/dL — ABNORMAL HIGH (ref <150)
VLDL: 47 mg/dL — AB (ref 0–40)

## 2014-08-28 LAB — VITAMIN D 25 HYDROXY (VIT D DEFICIENCY, FRACTURES): Vit D, 25-Hydroxy: 30 ng/mL (ref 30–100)

## 2014-08-28 LAB — TSH: TSH: 3.644 u[IU]/mL (ref 0.350–4.500)

## 2014-08-28 LAB — MAGNESIUM: Magnesium: 2.2 mg/dL (ref 1.5–2.5)

## 2014-08-28 NOTE — Telephone Encounter (Signed)
Letter mailed to patient with results and low fat diet information. Carmen [name],CMA

## 2014-09-06 ENCOUNTER — Ambulatory Visit: Payer: Self-pay | Admitting: Obstetrics and Gynecology

## 2014-09-11 ENCOUNTER — Encounter: Payer: Self-pay | Admitting: Obstetrics and Gynecology

## 2014-09-11 ENCOUNTER — Ambulatory Visit (INDEPENDENT_AMBULATORY_CARE_PROVIDER_SITE_OTHER): Payer: Self-pay | Admitting: Obstetrics and Gynecology

## 2014-09-11 VITALS — BP 161/86 | HR 61 | Temp 98.1°F | Ht 62.0 in | Wt 231.0 lb

## 2014-09-11 DIAGNOSIS — Z0271 Encounter for disability determination: Secondary | ICD-10-CM | POA: Insufficient documentation

## 2014-09-11 DIAGNOSIS — E669 Obesity, unspecified: Secondary | ICD-10-CM

## 2014-09-11 NOTE — Progress Notes (Signed)
    Subjective: Chief Complaint  Patient presents with  . f/u test results    HPI: Carmen [name] is a 54 y.o. presenting to clinic today to f/u on lab results and disccuss getting a handicap placard. Patient states that prior to coming to clinic she received lab results and does not have any questions at this time. 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 Patient does mention however that there is a shuttle service for students but she is not excited about that. She states that she thinks her diabetes and arthritis would qualify her for a handicap placed. Patient states her mother has one and they both have arthritis. Patient denies use of any assistive devices to get around, CP, and abnormal gait or pain with ambulation. She does state that when it is cold her arthritis hurts more. She also endorses SOB when walking long distances.  ROS reviewed and were negative unless otherwise noted in HPI.  Past Medical, Surgical, Social, and Family History Reviewed & Updated per EMR.   Objective: BP 161/86 mmHg  Pulse 61  Temp(Src) 98.1 F (36.7 C) (Oral)  Ht 5\' 2"  (1.575 m)  Wt 231 lb (104.781 kg)  BMI 42.24 kg/m2  Physical Exam  Constitutional: She is oriented to person, place, and time and well-developed, well-nourished, and in no distress.  Morbidly obese   Cardiovascular: Normal rate, regular rhythm and normal heart sounds.   Pulmonary/Chest: Effort normal and breath sounds normal. No respiratory distress.  Musculoskeletal: Normal range of motion. She exhibits no edema or tenderness.  Neurological: She is alert and oriented to person, place, and time. She has normal sensation, normal strength and intact cranial nerves. She displays no weakness. No cranial nerve deficit. Gait normal.  Skin: Skin is warm and dry.    Assessment/Plan: Please see  problem based Assessment and Plan   Carmen AdaJazma Phelps, DO 09/11/2014, 5:08 PM PGY-1, [facility]

## 2014-09-11 NOTE — Assessment & Plan Note (Signed)
Patient presented to clinic today wanting to be accessed for handicap placard. Upon reviewing PMH and doing a thorough physical exam I do not feel as though patient qualifies for handicap placard at this time. Since I am not this patient's PCP I do not know full history of patient. I discussed with patient talking this over more with PCP. If patient continues to be adament about handicap placard can consider PT referral for evaluation of disability that would limit patient mobility. 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

## 2014-09-11 NOTE — Assessment & Plan Note (Signed)
Patient encouraged to continue with weight loss. Feel as though some of her limitation with mobility, ie SOB with ambulation, is due to weight.

## 2014-09-11 NOTE — Patient Instructions (Signed)

## 2014-09-19 ENCOUNTER — Ambulatory Visit (INDEPENDENT_AMBULATORY_CARE_PROVIDER_SITE_OTHER): Payer: Self-pay | Admitting: Family Medicine

## 2014-09-19 ENCOUNTER — Encounter: Payer: Self-pay | Admitting: Family Medicine

## 2014-09-19 VITALS — BP 145/82 | HR 76 | Temp 98.8°F | Ht 62.0 in | Wt 227.0 lb

## 2014-09-19 DIAGNOSIS — M2550 Pain in unspecified joint: Secondary | ICD-10-CM

## 2014-09-19 DIAGNOSIS — E119 Type 2 diabetes mellitus without complications: Secondary | ICD-10-CM

## 2014-09-19 DIAGNOSIS — M545 Low back pain, unspecified: Secondary | ICD-10-CM

## 2014-09-19 DIAGNOSIS — E669 Obesity, unspecified: Secondary | ICD-10-CM

## 2014-09-19 DIAGNOSIS — I10 Essential (primary) hypertension: Secondary | ICD-10-CM

## 2014-09-19 LAB — POCT URINALYSIS DIP (MANUAL ENTRY)
BILIRUBIN UA: NEGATIVE
BILIRUBIN UA: NEGATIVE
Glucose, UA: 500
Leukocytes, UA: NEGATIVE
Nitrite, UA: NEGATIVE
RBC UA: NEGATIVE
Urobilinogen, UA: 0.2
pH, UA: 5.5

## 2014-09-19 LAB — POCT GLYCOSYLATED HEMOGLOBIN (HGB A1C): HEMOGLOBIN A1C: 8.7

## 2014-09-19 LAB — RHEUMATOID FACTOR

## 2014-09-19 MED ORDER — NAPROXEN 500 MG PO TABS: 500.0000 mg | ORAL_TABLET | Freq: Two times a day (BID) | ORAL | Status: DC

## 2014-09-19 NOTE — Assessment & Plan Note (Signed)
Improved control with addition of Losaartan

## 2014-09-19 NOTE — Patient Instructions (Signed)
Arthritis, Nonspecific Arthritis is inflammation of a joint. This usually means pain, redness, warmth or swelling are present. One or more joints may be involved. There are a number of types of arthritis. Your caregiver may not be able to tell what type of arthritis you have right away. CAUSES  The most common cause of arthritis is the wear and tear on the joint (osteoarthritis). This causes damage to the cartilage, which can break down over time. The knees, hips, back and neck are most often affected by this type of arthritis. Other types of arthritis and common causes of joint pain include:  Sprains and other injuries near the joint. Sometimes minor sprains and injuries cause pain and swelling that develop hours later.  Rheumatoid arthritis. This affects hands, feet and knees. It usually affects both sides of your body at the same time. It is often associated with chronic ailments, fever, weight loss and general weakness.  Crystal arthritis. Gout and pseudo gout can cause occasional acute severe pain, redness and swelling in the foot, ankle, or knee.  Infectious arthritis. Bacteria can get into a joint through a break in overlying skin. This can cause infection of the joint. Bacteria and viruses can also spread through the blood and affect your joints.  Drug, infectious and allergy reactions. Sometimes joints can become mildly painful and slightly swollen with these types of illnesses. SYMPTOMS   Pain is the main symptom.  Your joint or joints can also be red, swollen and warm or hot to the touch.  You may have a fever with certain types of arthritis, or even feel overall ill.  The joint with arthritis will hurt with movement. Stiffness is present with some types of arthritis. DIAGNOSIS  Your caregiver will suspect arthritis based on your description of your symptoms and on your exam. Testing may be needed to find the type of arthritis:  Blood and sometimes urine tests.  X-ray tests  and sometimes CT or MRI scans.  Removal of fluid from the joint (arthrocentesis) is done to check for bacteria, crystals or other causes. Your caregiver (or a specialist) will numb the area over the joint with a local anesthetic, and use a needle to remove joint fluid for examination. This procedure is only minimally uncomfortable.  Even with these tests, your caregiver may not be able to tell what kind of arthritis you have. Consultation with a specialist (rheumatologist) may be helpful. TREATMENT  Your caregiver will discuss with you treatment specific to your type of arthritis. If the specific type cannot be determined, then the following general recommendations may apply. Treatment of severe joint pain includes:  Rest.  Elevation.  Anti-inflammatory medication (for example, ibuprofen) may be prescribed. Avoiding activities that cause increased pain.  Only take over-the-counter or prescription medicines for pain and discomfort as recommended by your caregiver.  Cold packs over an inflamed joint may be used for 10 to 15 minutes every hour. Hot packs sometimes feel better, but do not use overnight. Do not use hot packs if you are diabetic without your caregiver's permission.  A cortisone shot into arthritic joints may help reduce pain and swelling.  Any acute arthritis that gets worse over the next 1 to 2 days needs to be looked at to be sure there is no joint infection. Long-term arthritis treatment involves modifying activities and lifestyle to reduce joint stress jarring. This can include weight loss. Also, exercise is needed to nourish the joint cartilage and remove waste. This helps keep the muscles   around the joint strong. HOME CARE INSTRUCTIONS   Do not take aspirin to relieve pain if gout is suspected. This elevates uric acid levels.  Only take over-the-counter or prescription medicines for pain, discomfort or fever as directed by your caregiver.  Rest the joint as much as  possible.  If your joint is swollen, keep it elevated.  Use crutches if the painful joint is in your leg.  Drinking plenty of fluids may help for certain types of arthritis.  Follow your caregiver's dietary instructions.  Try low-impact exercise such as:  Swimming.  Water aerobics.  Biking.  Walking.  Morning stiffness is often relieved by a warm shower.  Put your joints through regular range-of-motion. SEEK MEDICAL CARE IF:   You do not feel better in 24 hours or are getting worse.  You have side effects to medications, or are not getting better with treatment. SEEK IMMEDIATE MEDICAL CARE IF:   You have a fever.  You develop severe joint pain, swelling or redness.  Many joints are involved and become painful and swollen.  There is severe back pain and/or leg weakness.  You have loss of bowel or bladder control. Document Released: 07/01/2004 Document Revised: 08/16/2011 Document Reviewed: 07/17/2008 [facility] Patient Information 2015 [place], [place]. This information is not intended to replace advice given to you by your health care provider. Make sure you discuss any questions you have with your health care provider. Diabetes Mellitus and Food It is important for you to manage your blood sugar (glucose) level. Your blood glucose level can be greatly affected by what you eat. Eating healthier foods in the appropriate amounts throughout the day at about the same time each day will help you control your blood glucose level. It can also help slow or prevent worsening of your diabetes mellitus. Healthy eating may even help you improve the level of your blood pressure and reach or maintain a healthy weight.  HOW CAN FOOD AFFECT ME? Carbohydrates Carbohydrates affect your blood glucose level more than any other type of food. Your dietitian will help you determine how many carbohydrates to eat at each meal and teach you how to count carbohydrates. Counting carbohydrates is  important to keep your blood glucose at a healthy level, especially if you are using insulin or taking certain medicines for diabetes mellitus. Alcohol Alcohol can cause sudden decreases in blood glucose (hypoglycemia), especially if you use insulin or take certain medicines for diabetes mellitus. Hypoglycemia can be a life-threatening condition. Symptoms of hypoglycemia (sleepiness, dizziness, and disorientation) are similar to symptoms of having too much alcohol.  If your health care provider has given you approval to drink alcohol, do so in moderation and use the following guidelines:  Women should not have more than one drink per day, and men should not have more than two drinks per day. One drink is equal to:  12 oz of beer.  5 oz of wine.  1 oz of hard liquor.  Do not drink on an empty stomach.  Keep yourself hydrated. Have water, diet soda, or unsweetened iced tea.  Regular soda, juice, and other mixers might contain a lot of carbohydrates and should be counted. WHAT FOODS ARE NOT RECOMMENDED? As you make food choices, it is important to remember that all foods are not the same. Some foods have fewer nutrients per serving than other foods, even though they might have the same number of calories or carbohydrates. It is difficult to get your body what it needs when you  eat foods with fewer nutrients. Examples of foods that you should avoid that are high in calories and carbohydrates but low in nutrients include:  Trans fats (most processed foods list trans fats on the Nutrition Facts label).  Regular soda.  Juice.  Candy.  Sweets, such as cake, pie, doughnuts, and cookies.  Fried foods. WHAT FOODS CAN I EAT? Have nutrient-rich foods, which will nourish your body and keep you healthy. The food you should eat also will depend on several factors, including:  The calories you need.  The medicines you take.  Your weight.  Your blood glucose level.  Your blood pressure  level.  Your cholesterol level. You also should eat a variety of foods, including:  Protein, such as meat, poultry, fish, tofu, nuts, and seeds (lean animal proteins are best).  Fruits.  Vegetables.  Dairy products, such as milk, cheese, and yogurt (low fat is best).  Breads, grains, pasta, cereal, rice, and beans.  Fats such as olive oil, trans fat-free margarine, canola oil, avocado, and olives. DOES EVERYONE WITH DIABETES MELLITUS HAVE THE SAME MEAL PLAN? Because every person with diabetes mellitus is different, there is not one meal plan that works for everyone. It is very important that you meet with a dietitian who will help you create a meal plan that is just right for you. Document Released: 02/18/2005 Document Revised: 05/29/2013 Document Reviewed: 04/20/2013 [facility] Patient Information 2015 [place], [place]. This information is not intended to replace advice given to you by your health care provider. Make sure you discuss any questions you have with your health care provider. DASH Eating Plan DASH stands for "Dietary Approaches to Stop Hypertension." The DASH eating plan is a healthy eating plan that has been shown to reduce high blood pressure (hypertension). Additional health benefits may include reducing the risk of type 2 diabetes mellitus, heart disease, and stroke. The DASH eating plan may also help with weight loss. WHAT DO I NEED TO KNOW ABOUT THE DASH EATING PLAN? For the DASH eating plan, you will follow these general guidelines:  Choose foods with a percent daily value for sodium of less than 5% (as listed on the food label).  Use salt-free seasonings or herbs instead of table salt or sea salt.  Check with your health care provider or pharmacist before using salt substitutes.  Eat lower-sodium products, often labeled as "lower sodium" or "no salt added."  Eat fresh foods.  Eat more vegetables, fruits, and low-fat dairy products.  Choose whole grains. Look for  the word "whole" as the first word in the ingredient list.  Choose fish and skinless chicken or [place] more often than red meat. Limit fish, poultry, and meat to 6 oz (170 g) each day.  Limit sweets, desserts, sugars, and sugary drinks.  Choose heart-healthy fats.  Limit cheese to 1 oz (28 g) per day.  Eat more home-cooked food and less restaurant, buffet, and fast food.  Limit fried foods.  Cook foods using methods other than frying.  Limit canned vegetables. If you do use them, rinse them well to decrease the sodium.  When eating at a restaurant, ask that your food be prepared with less salt, or no salt if possible. WHAT FOODS CAN I EAT? Seek help from a dietitian for individual calorie needs. Grains Whole grain or whole wheat bread. Brown rice. Whole grain or whole wheat pasta. Quinoa, bulgur, and whole grain cereals. Low-sodium cereals. Corn or whole wheat flour tortillas. Whole grain cornbread. Whole grain crackers. Low-sodium  crackers. Vegetables Fresh or frozen vegetables (raw, steamed, roasted, or grilled). Low-sodium or reduced-sodium tomato and vegetable juices. Low-sodium or reduced-sodium tomato sauce and paste. Low-sodium or reduced-sodium canned vegetables.  Fruits All fresh, canned (in natural juice), or frozen fruits. Meat and Other Protein Products Ground beef (85% or leaner), grass-fed beef, or beef trimmed of fat. Skinless chicken or [place]. Ground chicken or [place]. Pork trimmed of fat. All fish and seafood. Eggs. Dried beans, peas, or lentils. Unsalted nuts and seeds. Unsalted canned beans. Dairy Low-fat dairy products, such as skim or 1% milk, 2% or reduced-fat cheeses, low-fat ricotta or cottage cheese, or plain low-fat yogurt. Low-sodium or reduced-sodium cheeses. Fats and Oils Tub margarines without trans fats. Light or reduced-fat mayonnaise and salad dressings (reduced sodium). Avocado. Safflower, olive, or canola oils. Natural peanut or almond  butter. Other Unsalted popcorn and pretzels. The items listed above may not be a complete list of recommended foods or beverages. Contact your dietitian for more options. WHAT FOODS ARE NOT RECOMMENDED? Grains White bread. White pasta. White rice. Refined cornbread. Bagels and croissants. Crackers that contain trans fat. Vegetables Creamed or fried vegetables. Vegetables in a cheese sauce. Regular canned vegetables. Regular canned tomato sauce and paste. Regular tomato and vegetable juices. Fruits Dried fruits. Canned fruit in light or heavy syrup. Fruit juice. Meat and Other Protein Products Fatty cuts of meat. Ribs, chicken wings, bacon, sausage, bologna, salami, chitterlings, fatback, hot dogs, bratwurst, and packaged luncheon meats. Salted nuts and seeds. Canned beans with salt. Dairy Whole or 2% milk, cream, half-and-half, and cream cheese. Whole-fat or sweetened yogurt. Full-fat cheeses or blue cheese. Nondairy creamers and whipped toppings. Processed cheese, cheese spreads, or cheese curds. Condiments Onion and garlic salt, seasoned salt, table salt, and sea salt. Canned and packaged gravies. Worcestershire sauce. Tartar sauce. Barbecue sauce. Teriyaki sauce. Soy sauce, including reduced sodium. Steak sauce. Fish sauce. Oyster sauce. Cocktail sauce. Horseradish. Ketchup and mustard. Meat flavorings and tenderizers. Bouillon cubes. Hot sauce. Tabasco sauce. Marinades. Taco seasonings. Relishes. Fats and Oils Butter, stick margarine, lard, shortening, ghee, and bacon fat. Coconut, palm kernel, or palm oils. Regular salad dressings. Other Pickles and olives. Salted popcorn and pretzels. The items listed above may not be a complete list of foods and beverages to avoid. Contact your dietitian for more information. WHERE CAN I FIND MORE INFORMATION? National Heart, Lung, and Blood Institute: [employer]Prom[employer].itwww.nhlbi.nih.gov/health/health-topics/topics/dash/ Document Released: 05/13/2011 Document Revised:  10/08/2013 Document Reviewed: 03/28/2013 [facility] Patient Information 2015 [place]. This information is not intended to replace advice given to you by your health care provider. Make sure you discuss any questions you have with your health care provider.

## 2014-09-19 NOTE — Assessment & Plan Note (Signed)
Continue meds and addition of exercise to improve control.  Watch diet.--written and verbal instructions given.

## 2014-09-19 NOTE — Progress Notes (Signed)
    Subjective:    Patient ID: Carmen [name] is a 54 y.o. female presenting with Diabetes  on 09/19/2014  HPI: Here today for f/u DM. Also reports that she is going back to work and going back to school. Would like to park closer at school. Reports pain in her legs with walking. Will need to walk 3 blocks to school. BP is improved with Diovan Reports vulvar and back pain and is worried about kidney infection.  Denies dysuria, or frequency.   Review of Systems  Constitutional: Negative for fever and chills.  Respiratory: Negative for shortness of breath.   Cardiovascular: Negative for chest pain.  Gastrointestinal: Negative for nausea, vomiting and abdominal pain.  Genitourinary: Negative for dysuria.  Skin: Negative for rash.      Objective:    BP 145/82 mmHg  Pulse 76  Temp(Src) 98.8 F (37.1 C) (Oral)  Ht $R'5\' 2"'dM$  (1.575 m)  Wt 337 lb (152.862 kg)  BMI 61.62 kg/m2 Physical Exam  Constitutional: She is oriented to person, place, and time. She appears well-developed and well-nourished. No distress.  HENT:  Head: Normocephalic and atraumatic.  Eyes: No scleral icterus.  Neck: Neck supple.  Cardiovascular: Normal rate.   Pulmonary/Chest: Effort normal.  Abdominal: Soft.  Neurological: She is alert and oriented to person, place, and time.  Skin: Skin is warm and dry.  Psychiatric: She has a normal mood and affect.        Assessment & Plan:   Problem List Items Addressed This Visit      High   HYPERTENSION, BENIGN SYSTEMIC    Improved control with addition of Losaartan      Diabetes mellitus without complication - Primary    Continue meds and addition of exercise to improve control.  Watch diet.--written and verbal instructions given.      Relevant Orders   POCT glycosylated hemoglobin (Hb A1C)     Medium   Obesity    Encouraged to walk more.  Will not give handicap placard at this time.  Moving more will help with weight.       Other Visit  Diagnoses    Arthralgia        Will check ESR, ANA, RF. Trial of NSAIDS with food.    Relevant Medications    naproxen (NAPROSYN) 500 MG tablet    Other Relevant Orders    Sedimentation Rate    ANA    Rheumatoid factor    Left-sided low back pain without sciatica        Given symptoms will check urinalysis.    Relevant Medications    naproxen (NAPROSYN) 500 MG tablet    Other Relevant Orders    POCT urinalysis dipstick       Return in about 3 months (around 12/19/2014).  Carrissa Taitano S 09/19/2014 2:51 PM

## 2014-09-19 NOTE — Assessment & Plan Note (Signed)
Encouraged to walk more.  Will not give handicap placard at this time.  Moving more will help with weight.

## 2014-09-20 LAB — ANA: Anti Nuclear Antibody(ANA): NEGATIVE

## 2014-09-20 LAB — SEDIMENTATION RATE: Sed Rate: 12 mm/hr (ref 0–30)

## 2014-09-23 ENCOUNTER — Encounter: Payer: Self-pay | Admitting: Family Medicine

## 2014-10-30 ENCOUNTER — Encounter: Payer: Self-pay | Admitting: Family Medicine

## 2014-11-05 ENCOUNTER — Encounter: Payer: Self-pay | Admitting: Family Medicine

## 2014-11-05 ENCOUNTER — Ambulatory Visit (INDEPENDENT_AMBULATORY_CARE_PROVIDER_SITE_OTHER): Payer: Self-pay | Admitting: Family Medicine

## 2014-11-05 VITALS — BP 161/91 | HR 74 | Temp 98.3°F | Ht 63.0 in | Wt 228.0 lb

## 2014-11-05 DIAGNOSIS — H109 Unspecified conjunctivitis: Secondary | ICD-10-CM | POA: Insufficient documentation

## 2014-11-05 DIAGNOSIS — B070 Plantar wart: Secondary | ICD-10-CM

## 2014-11-05 MED ORDER — POLYMYXIN B-TRIMETHOPRIM 10000-0.1 UNIT/ML-% OP SOLN: 1.0000 [drp] | Freq: Four times a day (QID) | OPHTHALMIC | Status: DC

## 2014-11-05 NOTE — Assessment & Plan Note (Signed)
Treating with Polytrim.

## 2014-11-05 NOTE — Progress Notes (Signed)
   Subjective:    Patient ID: Carmen [name], female    DOB: 08/31/1960, 54 y.o.   MRN: [id number]  HPI 54 year old female presents for a same day appointment with complaints of "pink eye". Patient also has complaints of right foot pain.  1) "Pink eye"  Began yesterday.  Patient noticed redness of her right eye as well as some crusting and purulent drainage.  She reports that she has difficulty with allergic conjunctivitis but that this seems to be different. She used some Pataday with no relief.  No exacerbating factors.  No changes in her vision.  No recent fevers or chills.  2) Foot pain  Patient reports a one-week history of right foot pain.  Pain is located on the heel of her foot.  No known inciting factor.  She states that she has an area of concern on her right heel. Her boyfriend has looked at the area and has attempted to drain it with no drainage or relief in her discomfort.  No open wound at this time.  No surrounding redness.   Patient does note some pain with ambulation.  Review of Systems Per HPI    Objective:   Physical Exam Filed Vitals:   11/05/14 1105  BP: 161/91  Pulse: 74  Temp: 98.3 F (36.8 C)   Vital signs reviewed.  Exam: General: well appearing, NAD. HEENT: NCAT. Right eye - mild to moderate conjunctival injection, no current drainage or crusting. EOMI. Extremities: Right foot - heel with a hard lesion noted in the midline. Appears consistent with a plantar wart.    Assessment & Plan:  See problem list.

## 2014-11-05 NOTE — Patient Instructions (Signed)
It was nice to see you today.  Use the drop as prescribed.  Regarding your heel discomfort - This appears to be due to a plantar wart.  Get some heel cups and that will help alleviate the discomfort.  Take care  Dr. Adriana Simasook

## 2014-11-05 NOTE — Assessment & Plan Note (Signed)
Patient's heel pain seems to be coming from a plantar wart. I offered the patient treatment in the office today versus conservative management at home. She chose conservative management home. Advised heel cups to aid in pain.

## 2014-11-20 ENCOUNTER — Ambulatory Visit (INDEPENDENT_AMBULATORY_CARE_PROVIDER_SITE_OTHER): Payer: Self-pay | Admitting: Family Medicine

## 2014-11-20 ENCOUNTER — Encounter: Payer: Self-pay | Admitting: Family Medicine

## 2014-11-20 VITALS — BP 156/83 | HR 79 | Temp 98.8°F | Wt 225.0 lb

## 2014-11-20 DIAGNOSIS — J309 Allergic rhinitis, unspecified: Secondary | ICD-10-CM

## 2014-11-20 DIAGNOSIS — H60391 Other infective otitis externa, right ear: Secondary | ICD-10-CM

## 2014-11-20 MED ORDER — NEOMYCIN-POLYMYXIN-HC 3.5-10000-1 OT SOLN: 3.0000 [drp] | Freq: Four times a day (QID) | OTIC | Status: DC

## 2014-11-20 NOTE — Patient Instructions (Signed)
Nice to see you. We will treat your ear with antibiotic drops.  Please start the nasocort and antihistamine for your sinus congestion.  If you develop worsening congestion, ear pain, fever, difficulty hearing, or trouble breathing please seek medical attention.

## 2014-11-21 NOTE — Progress Notes (Signed)
Patient ID: LILYANNE NOVIK, female   DOB: [birth year], 54 y.o.   MRN: [id number]  Marikay Alar, MD Phone: [phone]  MCCARTNEY KEEL is a 54 y.o. female who presents today for same day appointment.   Patient reports months of sinus congestion that has worsened over the past 3 days. Notes right ear is achey. Teeth are aching as well. Has post nasal drip. Some cough. No fevers or dyspnea. Notes she has dry ear syndrome and has used ciprodex and polymyxin drops in the past for this. Has not been using flonase recently. Has been taking decongestants for this, though typically tries to stay away from these. Has also been taking antihistamines.  PMH: nonsmoker.    ROS: Per HPI   Physical Exam Filed Vitals:   11/20/14 1100  BP: 156/83  Pulse: 79  Temp: 98.8 F (37.1 C)    Gen: Well NAD HEENT: PERRL,  MMM, no tonsils appreciated, no OP erythema, right ear canal with mild edema with minimal erythema and yellow flaking, normal TMs bilaterally, no cervical LAD, no mastoid process tenderness or swelling Lungs: CTABL Nl WOB Heart: RRR, no murmur appreciated Exts: Non edematous BL  LE, warm and well perfused.    Assessment/Plan: Please see individual problem list.  Marikay Alar, MD [facility] PGY-3

## 2014-11-22 DIAGNOSIS — H60399 Other infective otitis externa, unspecified ear: Secondary | ICD-10-CM | POA: Insufficient documentation

## 2014-11-22 NOTE — Assessment & Plan Note (Addendum)
Patient appears to have mild otitis externa. No spores to indicate fungal source. Likely irritant vs bacterial. No signs of perforation. Will treat with cortisporin otic drops. Will follow-up with ENT. Given return precautions.

## 2014-11-22 NOTE — Assessment & Plan Note (Signed)
Given length of symptoms I suspect this is more likely to be an exacerbation of her allergies, though could be viral illness on top of allergies. Unlikely bacterial sinus infection given time course. Patient is to restart nasocort and continue OTC antihistamine. Advised to avoid decongestants given her history of HTN. If not better in the next 4-5 days patient will call back and will send in antibiotic. Given return precautions.

## 2015-01-06 ENCOUNTER — Other Ambulatory Visit: Payer: Self-pay | Admitting: Family Medicine

## 2015-02-17 ENCOUNTER — Other Ambulatory Visit: Payer: Self-pay | Admitting: *Deleted

## 2015-02-17 DIAGNOSIS — I10 Essential (primary) hypertension: Secondary | ICD-10-CM

## 2015-02-18 MED ORDER — HYDROCHLOROTHIAZIDE 25 MG PO TABS: 25.0000 mg | ORAL_TABLET | Freq: Every day | ORAL | Status: DC

## 2015-02-19 ENCOUNTER — Other Ambulatory Visit: Payer: Self-pay | Admitting: Family Medicine

## 2015-02-19 ENCOUNTER — Ambulatory Visit (INDEPENDENT_AMBULATORY_CARE_PROVIDER_SITE_OTHER): Payer: Self-pay | Admitting: Family Medicine

## 2015-02-19 ENCOUNTER — Encounter: Payer: Self-pay | Admitting: Family Medicine

## 2015-02-19 ENCOUNTER — Telehealth: Payer: Self-pay | Admitting: *Deleted

## 2015-02-19 VITALS — BP 141/81 | HR 71 | Temp 98.4°F | Ht 63.0 in | Wt 217.1 lb

## 2015-02-19 DIAGNOSIS — E119 Type 2 diabetes mellitus without complications: Secondary | ICD-10-CM

## 2015-02-19 DIAGNOSIS — Z1231 Encounter for screening mammogram for malignant neoplasm of breast: Secondary | ICD-10-CM

## 2015-02-19 DIAGNOSIS — I10 Essential (primary) hypertension: Secondary | ICD-10-CM

## 2015-02-19 LAB — POCT GLYCOSYLATED HEMOGLOBIN (HGB A1C): HEMOGLOBIN A1C: 10

## 2015-02-19 MED ORDER — HYDROCHLOROTHIAZIDE 25 MG PO TABS: 25.0000 mg | ORAL_TABLET | Freq: Every day | ORAL | Status: DC

## 2015-02-19 MED ORDER — LOSARTAN POTASSIUM 50 MG PO TABS: 50.0000 mg | ORAL_TABLET | Freq: Every day | ORAL | Status: DC

## 2015-02-19 MED ORDER — ASPIRIN 81 MG PO CHEW: 81.0000 mg | CHEWABLE_TABLET | Freq: Every day | ORAL | Status: DC

## 2015-02-19 MED ORDER — ATENOLOL 50 MG PO TABS: 50.0000 mg | ORAL_TABLET | Freq: Every day | ORAL | Status: DC

## 2015-02-19 MED ORDER — LOSARTAN POTASSIUM 25 MG PO TABS: 25.0000 mg | ORAL_TABLET | Freq: Every day | ORAL | Status: DC

## 2015-02-19 MED ORDER — GLIPIZIDE 5 MG PO TABS: 5.0000 mg | ORAL_TABLET | Freq: Two times a day (BID) | ORAL | Status: DC

## 2015-02-19 MED ORDER — GLIMEPIRIDE 4 MG PO TABS: 4.0000 mg | ORAL_TABLET | Freq: Every day | ORAL | Status: DC

## 2015-02-19 NOTE — Telephone Encounter (Signed)
Received a call in our office for a rx refill for Glipizide  from [employer].  Pt was seen by Dr Shawnie Pons at [facility]. Please advise.

## 2015-02-19 NOTE — Patient Instructions (Signed)

## 2015-02-19 NOTE — Assessment & Plan Note (Signed)
Will increase her losartan to try to achieve control

## 2015-02-19 NOTE — Progress Notes (Signed)
    Subjective:    Patient ID: Carmen [name] is a 54 y.o. female presenting with Follow-up and Medication Refill  on 02/19/2015  HPI: Reports her BS are usually 300.  She is only taking her glipizide once daily. Previously better controlled on Metformin and Amaryl. Has BP that is still poorly controlled. Can not do pap, due to cost.    Review of Systems  Constitutional: Negative for fever and chills.  Respiratory: Negative for shortness of breath.   Cardiovascular: Negative for chest pain.  Gastrointestinal: Negative for nausea, vomiting and abdominal pain.  Genitourinary: Negative for dysuria.  Skin: Negative for rash.      Objective:    BP 141/81 mmHg  Pulse 71  Temp(Src) 98.4 F (36.9 C) (Oral)  Ht  (1.6 m)  Wt 217 lb 2 oz (98.487 kg)  BMI 38.47 kg/m2 Physical Exam  Constitutional: She is oriented to person, place, and time. She appears well-developed and well-nourished. No distress.  HENT:  Head: Normocephalic and atraumatic.  Eyes: No scleral icterus.  Neck: Neck supple.  Cardiovascular: Normal rate.   Pulmonary/Chest: Effort normal.  Abdominal: Soft.  Neurological: She is alert and oriented to person, place, and time.  Skin: Skin is warm and dry.  Psychiatric: She has a normal mood and affect.   Hemoglobin A1C 10.0     Assessment & Plan:   Problem List Items Addressed This Visit      High   HYPERTENSION, BENIGN SYSTEMIC    Will increase her losartan to try to achieve control      Relevant Medications   atenolol (TENORMIN) 50 MG tablet   aspirin 81 MG chewable tablet   hydrochlorothiazide (HYDRODIURIL) 25 MG tablet   losartan (COZAAR) 50 MG tablet   Diabetes mellitus without complication - Primary    Worsening control. Add Amaryl again.      Relevant Medications   aspirin 81 MG chewable tablet   glimepiride (AMARYL) 4 MG tablet   losartan (COZAAR) 50 MG tablet   Other Relevant Orders   POCT glycosylated hemoglobin (Hb A1C) (Completed)     Patient declines mammogram, pap testing due to cost.  Total face-to-face time with patient: 25 minutes. Over 50% of encounter was spent on counseling and coordination of care. Return in about 6 months (around 08/19/2015).  Nayomi Tabron S 02/19/2015 10:27 AM

## 2015-02-19 NOTE — Assessment & Plan Note (Signed)
Worsening control. Add Amaryl again.

## 2015-02-19 NOTE — Addendum Note (Signed)
Addended by: Reva Bores on: 02/19/2015 03:38 PM   Modules accepted: Orders

## 2015-02-25 ENCOUNTER — Telehealth: Payer: Self-pay | Admitting: Family Medicine

## 2015-02-25 DIAGNOSIS — E119 Type 2 diabetes mellitus without complications: Secondary | ICD-10-CM

## 2015-02-25 MED ORDER — GLIMEPIRIDE 4 MG PO TABS: 4.0000 mg | ORAL_TABLET | Freq: Every day | ORAL | Status: DC

## 2015-02-25 NOTE — Telephone Encounter (Signed)
Needs glimipride sent to [facility] [phone]

## 2015-02-25 NOTE — Telephone Encounter (Signed)
Refill completed.  Martin, Tamika L, RN  

## 2015-02-27 ENCOUNTER — Ambulatory Visit: Payer: Self-pay

## 2015-02-27 ENCOUNTER — Ambulatory Visit (HOSPITAL_COMMUNITY)
Admission: RE | Admit: 2015-02-27 | Discharge: 2015-02-27 | Disposition: A | Discharge status: Home / Self Care | Payer: Self-pay | Source: Ambulatory Visit | Attending: Family Medicine | Admitting: Family Medicine

## 2015-02-27 ENCOUNTER — Telehealth: Payer: Self-pay | Admitting: Family Medicine

## 2015-02-27 ENCOUNTER — Encounter: Payer: Self-pay | Admitting: Family Medicine

## 2015-02-27 ENCOUNTER — Ambulatory Visit
Admission: RE | Admit: 2015-02-27 | Discharge: 2015-02-27 | Disposition: A | Discharge status: Home / Self Care | Payer: Self-pay | Source: Ambulatory Visit | Attending: Family Medicine | Admitting: Family Medicine

## 2015-02-27 ENCOUNTER — Ambulatory Visit (INDEPENDENT_AMBULATORY_CARE_PROVIDER_SITE_OTHER): Payer: Self-pay | Admitting: Family Medicine

## 2015-02-27 ENCOUNTER — Ambulatory Visit (HOSPITAL_COMMUNITY): Payer: Self-pay

## 2015-02-27 VITALS — BP 126/69 | HR 101 | Temp 98.6°F | Ht 63.0 in | Wt 217.9 lb

## 2015-02-27 DIAGNOSIS — Z1231 Encounter for screening mammogram for malignant neoplasm of breast: Secondary | ICD-10-CM

## 2015-02-27 DIAGNOSIS — M545 Low back pain, unspecified: Secondary | ICD-10-CM

## 2015-02-27 DIAGNOSIS — M549 Dorsalgia, unspecified: Secondary | ICD-10-CM | POA: Insufficient documentation

## 2015-02-27 NOTE — Progress Notes (Signed)
   Subjective:    Patient ID: Carmen [name], female    DOB: [birth year], 54 y.o.   MRN: [id number]  Seen for Same day visit for   CC: back pain/fall   She fell on the kitchen floor a few days ago after mopping her floor.  She landed her back. The left lumbar.  Having pain in that area.  Has taken hot bath and shower  Taken ibuprofen 800 mg  Pain worse with standing from a seated position  Pain is described as sharp pani when she moves  Pain radiates no. History of trauma or injury: hx of back injury several years ago. 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 Objective:  BP 126/69 mmHg  Pulse 101  Temp(Src) 98.6 F (37 C) (Oral)  Ht  (1.6 m)  Wt 217 lb 14.4 oz (98.839 kg)  BMI 38.61 kg/m2  General: NAD Skin: warm and dry, no rashes noted MSK:  Back:  Appearance: Erythema or ecchymosis, no gross deformity Palpation: Tenderness to palpation in the left lumbar spine just proximal to the iliac crest Hip:  Appearance: No ecchymosis or erythema Palpation: No tenderness to palpation of the greater trochanter Rotation limited internal rotation and normal external rotation FADIR: Negative FABER: Negative Neuro: Strength hip flexion 5/5, knee extension 5/5, knee flexion 5/5, dorsiflexion 5/5, plantar flexion 5/5 Reflexes: patella 2/2 Bilateral  Achilles 2/2 Bilateral Straight Leg Raise: negative Sensation to light touch intact: yes Neurovascularly intact      Assessment & Plan:   Back pain Back pain secondary to fall a few days ago. Fall was with no loss of consciousness and secondary to the floor being recently  mopped Tenderness to palpation just above the iliac crest but no ecchymosis or deformity on exam. No red flags on exam. - Ordered lumbar and pelvic imaging. I will call or send a letter with the results - She can use ibuprofen or Tylenol for pain - If she continues to have pain in the next 3-4 weeks then have her follow-up. Consider PT or home modalities if still having pain +/- adding Flexeril

## 2015-02-27 NOTE — Assessment & Plan Note (Signed)
Back pain secondary to fall a few days ago. Fall was with no loss of consciousness and secondary to the floor being recently mopped Tenderness to palpation just above the iliac crest but no ecchymosis or deformity on exam. No red flags on exam. - Ordered lumbar and pelvic imaging. I will call or send a letter with the results - She can use ibuprofen or Tylenol for pain - If she continues to have pain in the next 3-4 weeks then have her follow-up. Consider PT or home modalities if still having pain +/- adding Flexeril

## 2015-02-27 NOTE — Patient Instructions (Signed)
Thank you for coming in,   I will call you or send a letter with the results from today.   You can use ibuprofen or tylenol for your pain.   Sign up for My Chart to have easy access to your labs results, and communication with your Primary care physician   Please feel free to call with any questions or concerns at any time, at [phone]. --Dr. Jordan Likes

## 2015-02-27 NOTE — Telephone Encounter (Signed)
Spoke with patient about her x-ray results.   Myra Rude, MD PGY-3, [facility] 02/27/2015, 5:19 PM

## 2015-03-05 ENCOUNTER — Ambulatory Visit: Payer: Self-pay

## 2015-06-13 ENCOUNTER — Ambulatory Visit (INDEPENDENT_AMBULATORY_CARE_PROVIDER_SITE_OTHER): Payer: Self-pay | Admitting: Internal Medicine

## 2015-06-13 ENCOUNTER — Encounter: Payer: Self-pay | Admitting: Internal Medicine

## 2015-06-13 VITALS — BP 120/82 | HR 77 | Temp 98.6°F | Ht 63.0 in | Wt 218.0 lb

## 2015-06-13 DIAGNOSIS — M545 Low back pain, unspecified: Secondary | ICD-10-CM

## 2015-06-13 DIAGNOSIS — E119 Type 2 diabetes mellitus without complications: Secondary | ICD-10-CM

## 2015-06-13 DIAGNOSIS — E118 Type 2 diabetes mellitus with unspecified complications: Secondary | ICD-10-CM

## 2015-06-13 DIAGNOSIS — L298 Other pruritus: Secondary | ICD-10-CM

## 2015-06-13 DIAGNOSIS — N898 Other specified noninflammatory disorders of vagina: Secondary | ICD-10-CM

## 2015-06-13 LAB — POCT WET PREP (WET MOUNT): CLUE CELLS WET PREP WHIFF POC: NEGATIVE

## 2015-06-13 LAB — POCT URINALYSIS DIPSTICK
Bilirubin, UA: NEGATIVE
Blood, UA: NEGATIVE
GLUCOSE UA: 250
Ketones, UA: NEGATIVE
LEUKOCYTES UA: NEGATIVE
NITRITE UA: NEGATIVE
Protein, UA: NEGATIVE
SPEC GRAV UA: 1.025
UROBILINOGEN UA: 0.2
pH, UA: 5

## 2015-06-13 LAB — POCT GLYCOSYLATED HEMOGLOBIN (HGB A1C): HEMOGLOBIN A1C: 8.6

## 2015-06-13 MED ORDER — FLUCONAZOLE 150 MG PO TABS: 150.0000 mg | ORAL_TABLET | Freq: Once | ORAL | Status: DC

## 2015-06-13 NOTE — Patient Instructions (Signed)
Carmen [name],  For your vaginal itching, please take diflucan 150 mg once. If still itching after 72 hours, please take another diflucan tablet.  Please make an appointment to follow-up your diabetes within the next month.  I recommend stretching exercises to help with back pain.  Thank you, Dr. Sampson GoonFitzgerald   Back Exercises If you have pain in your back, do these exercises 2-3 times each day or as told by your doctor. When the pain goes away, do the exercises once each day, but repeat the steps more times for each exercise (do more repetitions). If you do not have pain in your back, do these exercises once each day or as told by your doctor. EXERCISES Single Knee to Chest Do these steps 3-5 times in a row for each leg: 1. Lie on your back on a firm bed or the floor with your legs stretched out. 2. Bring one knee to your chest. 3. Hold your knee to your chest by grabbing your knee or thigh. 4. Pull on your knee until you feel a gentle stretch in your lower back. 5. Keep doing the stretch for 10-30 seconds. 6. Slowly let go of your leg and straighten it. Pelvic Tilt Do these steps 5-10 times in a row: 1. Lie on your back on a firm bed or the floor with your legs stretched out. 2. Bend your knees so they point up to the ceiling. Your feet should be flat on the floor. 3. Tighten your lower belly (abdomen) muscles to press your lower back against the floor. This will make your tailbone point up to the ceiling instead of pointing down to your feet or the floor. 4. Stay in this position for 5-10 seconds while you gently tighten your muscles and breathe evenly. Cat-Cow Do these steps until your lower back bends more easily: 1. Get on your hands and knees on a firm surface. Keep your hands under your shoulders, and keep your knees under your hips. You may put padding under your knees. 2. Let your head hang down, and make your tailbone point down to the floor so your lower back is round like the  back of a cat. 3. Stay in this position for 5 seconds. 4. Slowly lift your head and make your tailbone point up to the ceiling so your back hangs low (sags) like the back of a cow. 5. Stay in this position for 5 seconds. Press-Ups Do these steps 5-10 times in a row: 1. Lie on your belly (face-down) on the floor. 2. Place your hands near your head, about shoulder-width apart. 3. While you keep your back relaxed and keep your hips on the floor, slowly straighten your arms to raise the top half of your body and lift your shoulders. Do not use your back muscles. To make yourself more comfortable, you may change where you place your hands. 4. Stay in this position for 5 seconds. 5. Slowly return to lying flat on the floor. Bridges Do these steps 10 times in a row: 1. Lie on your back on a firm surface. 2. Bend your knees so they point up to the ceiling. Your feet should be flat on the floor. 3. Tighten your butt muscles and lift your butt off of the floor until your waist is almost as high as your knees. If you do not feel the muscles working in your butt and the back of your thighs, slide your feet 1-2 inches farther away from your butt. 4. Stay in  this position for 3-5 seconds. 5. Slowly lower your butt to the floor, and let your butt muscles relax. If this exercise is too easy, try doing it with your arms crossed over your chest. Belly Crunches Do these steps 5-10 times in a row: 1. Lie on your back on a firm bed or the floor with your legs stretched out. 2. Bend your knees so they point up to the ceiling. Your feet should be flat on the floor. 3. Cross your arms over your chest. 4. Tip your chin a little bit toward your chest but do not bend your neck. 5. Tighten your belly muscles and slowly raise your chest just enough to lift your shoulder blades a tiny bit off of the floor. 6. Slowly lower your chest and your head to the floor. Back Lifts Do these steps 5-10 times in a row: 1. Lie on  your belly (face-down) with your arms at your sides, and rest your forehead on the floor. 2. Tighten the muscles in your legs and your butt. 3. Slowly lift your chest off of the floor while you keep your hips on the floor. Keep the back of your head in line with the curve in your back. Look at the floor while you do this. 4. Stay in this position for 3-5 seconds. 5. Slowly lower your chest and your face to the floor. GET HELP IF:  Your back pain gets a lot worse when you do an exercise.  Your back pain does not lessen 2 hours after you exercise. If you have any of these problems, stop doing the exercises. Do not do them again unless your doctor says it is okay. GET HELP RIGHT AWAY IF:  You have sudden, very bad back pain. If this happens, stop doing the exercises. Do not do them again unless your doctor says it is okay.   This information is not intended to replace advice given to you by your health care provider. Make sure you discuss any questions you have with your health care provider.   Document Released: 06/26/2010 Document Revised: 02/12/2015 Document Reviewed: 07/18/2014 Elsevier Interactive Patient Education [employer]

## 2015-06-13 NOTE — Progress Notes (Signed)
Subjective: Carmen [name] is a 55 y.o. female patient of PRATT,TANYA S, MD, presenting for vaginal itching.  Vaginal itching: - Began last week; noticed itching and some odor - Has been using clomitrazole cream, which has helped a little.   - Had mild pain after sexual intercourse with her boyfriend on Sunday - Did take bubble bath with fragranced wash - Not interested in further STD testing - Concerned she could have a UTI because her blood sugars have been high, but denies dysuria  Back pain: - Frequent soreness mostly left-sided - Says mattress is 54 years old - Spends most her days in front of a computer looking for jobs  T2DM: - Reports that sugars have been running high in the 180s and 200s - Hgb A1c was 10.0 on 02/19/2015 - Taking glipizide 5 mg BID and glimepiride 4 mg daily; could not tolerate metformin, which gave her diarrhea - Says "mom eats junk" and she has to eat what mom has in the house (donuts, cereal, cinnamon rolls). She has food stamps and buys groceries for her and her boyfriend. Enjoys cooking but is not limiting carbohydrates. Boyfriend cooks a lot of pasta.  - Has reduced consumption of sugary beverages like propel after reading the label and finding it has sugar. Says money is a limitation for eating healthy but will not drink just plain water. Likes crystal light tea (sugar free) and Dannon flavored water.  - Unwilling to take insulin. 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 See HPI.  - Former smoker  Objective: BP 120/82 mmHg   Pulse 77  Temp(Src) 98.6 F (37 C) (Oral)  Ht 5\' 3"  (1.6 m)  Wt 218 lb (98.884 kg)  BMI 38.63 kg/m2  SpO2 97% Gen: Obese, well-appearing 55 y.o. female in no distress Cardiac: RRR, S1, S2, no m/r/g Pulm: CTAB Abdominal: Obese, soft, NT, +BS MSK: Mild TTP lateral to lumbar spine on left, no erythema or swelling; no limitation of flexion or extension or lateral flexion of spine GU: No lesions appreciated on speculum exam. Moderate amount of white, curd-like discharge. No cervical motion tenderness on bimanual exam.   Wet prep: negative UA: negative for nitrites and leukocytes Hgb A1c: 8.6  Assessment/Plan: Carmen [name] is a 55 y.o. female here for vaginal itching, back pain and poorly controlled T2DM.  Recommended follow-up in 1 month to discuss diabetes management further.   Vaginal itching - Negative wet prep and UA - Given itching and curd-like discharge, prescribed diflucan to treat empirically instead of getting a culture  Diabetes mellitus without complication - Hgb A1c improved from 3 months ago but not at goal of < 7 - Patient unable to state specific exercise or diet goals  - Counseled on importance of lifestyle modification for controlling diabetes, especially considering patient's desire not to start insulin  Back pain - Recommended stretching exercises, considering patient's sedentary lifestyle   Dani GobbleHillary Tawni Melkonian, MD [facility], PGY-1

## 2015-06-14 DIAGNOSIS — N898 Other specified noninflammatory disorders of vagina: Secondary | ICD-10-CM | POA: Insufficient documentation

## 2015-06-14 NOTE — Assessment & Plan Note (Signed)
-   Hgb A1c improved from 3 months ago but not at goal of < 7 - Patient unable to state specific exercise or diet goals  - Counseled on importance of lifestyle modification for controlling diabetes, especially considering patient's desire not to start insulin

## 2015-06-14 NOTE — Assessment & Plan Note (Signed)
-   Recommended stretching exercises, considering patient's sedentary lifestyle

## 2015-06-14 NOTE — Assessment & Plan Note (Signed)
-   Negative wet prep and UA - Given itching and curd-like discharge, prescribed diflucan to treat empirically instead of getting a culture

## 2015-06-20 ENCOUNTER — Encounter: Payer: Self-pay | Admitting: Internal Medicine

## 2015-06-20 ENCOUNTER — Ambulatory Visit (INDEPENDENT_AMBULATORY_CARE_PROVIDER_SITE_OTHER): Payer: Self-pay | Admitting: Internal Medicine

## 2015-06-20 VITALS — BP 128/62 | HR 88 | Temp 98.4°F | Ht 63.0 in | Wt 219.7 lb

## 2015-06-20 DIAGNOSIS — N9489 Other specified conditions associated with female genital organs and menstrual cycle: Secondary | ICD-10-CM

## 2015-06-20 DIAGNOSIS — Z113 Encounter for screening for infections with a predominantly sexual mode of transmission: Secondary | ICD-10-CM

## 2015-06-20 DIAGNOSIS — R8290 Unspecified abnormal findings in urine: Secondary | ICD-10-CM

## 2015-06-20 DIAGNOSIS — N898 Other specified noninflammatory disorders of vagina: Secondary | ICD-10-CM

## 2015-06-20 LAB — POCT URINALYSIS DIPSTICK
BILIRUBIN UA: NEGATIVE
GLUCOSE UA: 250
KETONES UA: NEGATIVE
Leukocytes, UA: NEGATIVE
Nitrite, UA: NEGATIVE
Protein, UA: NEGATIVE
RBC UA: NEGATIVE
Urobilinogen, UA: 0.2
pH, UA: 5

## 2015-06-20 LAB — POCT WET PREP (WET MOUNT): CLUE CELLS WET PREP WHIFF POC: NEGATIVE

## 2015-06-20 NOTE — Patient Instructions (Signed)
Ms. Carmen [name],  Thank you for coming in. Your exam was normal and urine and wet prep were both negative for infection. Your urine was very concentrated, which means you are likely dehydrated. Sodas have caffeine which can make dehydration worse.   I recommend drinking plenty of water and urinating within 30 minutes of sexual intercourse to reduce risk of urinary tract infections. To increase your water intake, try diluting your sugar free drinks to get used to the taste of water.   Your hgb A1c last week was 8.6 compared to 10.0 in September, so this is improved. However, goal is less than 7.0.

## 2015-06-20 NOTE — Progress Notes (Signed)
   Subjective:    Patient ID: Carmen [name], female    DOB: 06/19/1960, 55 y.o.   MRN: [id number]  HPI  Ms. Carmen [name] is a 55 y.o. woman who was seen last week for concern for a yeast infection. She is back today with concerns that her urine smells and for vaginal odor. Her wet prep last week was negative, but she had thick, curd-like discharge. She was treated presumptively with diflucan x 2, 72 hours apart, after already completing a course of clomitrazole cream. Today, she says the itching is better. She has not had sexual intercourse since New Year's Day. She has only one partner, her boyfriend of 8 years, and is not interested in STD testing beyond a repeat wet prep, which she is requesting. She denies dysuria but says sometimes she can urinate a little more after she thinks she has finished voiding. She also states her blood sugars have been running high the past few days between 160 and 180. She wants to make sure the yeast infection is gone before she has sexual intercourse with her boyfriend this weekend. She does use KY jelly to help but does not use condoms due to problems with irritation in the past.  She has only had 2 yeast infections within the last 8 years but says she has required extended treatment previously. She has not had her period in 1.5 years.   Review of Systems  Constitutional: Negative for fever.  Gastrointestinal: Negative for abdominal pain.  Genitourinary: Negative for dysuria.      Objective: Blood pressure 128/62, pulse 88, temperature 98.4 F (36.9 C), temperature source Oral, height 5\' 3"  (1.6 m), weight 219 lb 11.2 oz (99.655 kg).   Physical Exam  Constitutional: No distress.  Obese  Abdominal: There is no tenderness. There is no rebound and no guarding.  Genitourinary: Vagina normal. No vaginal discharge found.  No lesions noted on speculum exam. No cervical motion tenderness on bimanual exam.    Wet prep: negative UA: Negative for nitrites,  leukocytes and ketones. Specific gravity > 1.030. Glucose present.     Assessment & Plan:  Ms. Carmen [name] is a 55 y.o. menopausal woman who presented with concern for UTI and continued yeast infection. Both UA and wet prep were negative for infection. No evidence of PID on bimanual exam.   Follow-up in 3 months for repeat hgb A1c and diabetes management.  Vaginal odor - No obvious odor during speculum exam.  - No evidence of infection on wet prep, UA or vaginal exam. - Recommended drinking more water, given concentrated urine and to help prevent UTIs in the future. Patient does not like taste of water and will not drink water alone. Recommended strategy of diluting her sugar-free flavored beverages to increase intake and decrease cost of buying flavored water, crystal light, etc.    Dani GobbleHillary Fitzgerald, MD [facility], PGY-1

## 2015-06-21 ENCOUNTER — Other Ambulatory Visit: Payer: Self-pay | Admitting: Family Medicine

## 2015-06-21 DIAGNOSIS — N898 Other specified noninflammatory disorders of vagina: Secondary | ICD-10-CM | POA: Insufficient documentation

## 2015-06-21 NOTE — Assessment & Plan Note (Signed)
-   No obvious odor during speculum exam.  - No evidence of infection on wet prep, UA or vaginal exam. - Recommended drinking more water, given concentrated urine and to help prevent UTIs in the future. Patient does not like taste of water and will not drink water alone. Recommended strategy of diluting her sugar-free flavored beverages to increase intake and decrease cost of buying flavored water, crystal light, etc.

## 2015-06-25 ENCOUNTER — Other Ambulatory Visit: Payer: Self-pay | Admitting: Family Medicine

## 2015-08-20 ENCOUNTER — Ambulatory Visit (INDEPENDENT_AMBULATORY_CARE_PROVIDER_SITE_OTHER): Payer: Self-pay | Admitting: Family Medicine

## 2015-08-20 ENCOUNTER — Encounter: Payer: Self-pay | Admitting: Family Medicine

## 2015-08-20 VITALS — BP 151/86 | HR 75 | Temp 98.2°F | Wt 227.0 lb

## 2015-08-20 DIAGNOSIS — E119 Type 2 diabetes mellitus without complications: Secondary | ICD-10-CM

## 2015-08-20 DIAGNOSIS — I10 Essential (primary) hypertension: Secondary | ICD-10-CM

## 2015-08-20 DIAGNOSIS — L0292 Furuncle, unspecified: Secondary | ICD-10-CM

## 2015-08-20 NOTE — Patient Instructions (Addendum)
Basic Carbohydrate Counting for Diabetes Mellitus Carbohydrate counting is a method for keeping track of the amount of carbohydrates you eat. Eating carbohydrates naturally increases the level of sugar (glucose) in your blood, so it is important for you to know the amount that is okay for you to have in every meal. Carbohydrate counting helps keep the level of glucose in your blood within normal limits. The amount of carbohydrates allowed is different for every person. A dietitian can help you calculate the amount that is right for you. Once you know the amount of carbohydrates you can have, you can count the carbohydrates in the foods you want to eat. Carbohydrates are found in the following foods:  Grains, such as breads and cereals.  Dried beans and soy products.  Starchy vegetables, such as potatoes, peas, and corn.  Fruit and fruit juices.  Milk and yogurt.  Sweets and snack foods, such as cake, cookies, candy, chips, soft drinks, and fruit drinks. CARBOHYDRATE COUNTING There are two ways to count the carbohydrates in your food. You can use either of the methods or a combination of both. Reading the "Nutrition Facts" on Packaged Food The "Nutrition Facts" is an area that is included on the labels of almost all packaged food and beverages in the [place]. It includes the serving size of that food or beverage and information about the nutrients in each serving of the food, including the grams (g) of carbohydrate per serving.  Decide the number of servings of this food or beverage that you will be able to eat or drink. Multiply that number of servings by the number of grams of carbohydrate that is listed on the label for that serving. The total will be the amount of carbohydrates you will be having when you eat or drink this food or beverage. Learning Standard Serving Sizes of Food When you eat food that is not packaged or does not include "Nutrition Facts" on the label, you need to  measure the servings in order to count the amount of carbohydrates.A serving of most carbohydrate-rich foods contains about 15 g of carbohydrates. The following list includes serving sizes of carbohydrate-rich foods that provide 15 g ofcarbohydrate per serving:   1 slice of bread (1 oz) or 1 six-inch tortilla.    of a hamburger bun or English muffin.  4-6 crackers.   cup unsweetened dry cereal.    cup hot cereal.   cup rice or pasta.    cup mashed potatoes or  of a large baked potato.  1 cup fresh fruit or one small piece of fruit.    cup canned or frozen fruit or fruit juice.  1 cup milk.   cup plain fat-free yogurt or yogurt sweetened with artificial sweeteners.   cup cooked dried beans or starchy vegetable, such as peas, corn, or potatoes.  Decide the number of standard-size servings that you will eat. Multiply that number of servings by 15 (the grams of carbohydrates in that serving). For example, if you eat 2 cups of strawberries, you will have eaten 2 servings and 30 g of carbohydrates (2 servings x 15 g = 30 g). For foods such as soups and casseroles, in which more than one food is mixed in, you will need to count the carbohydrates in each food that is included. EXAMPLE OF CARBOHYDRATE COUNTING Sample Dinner  3 oz chicken breast.   cup of brown rice.   cup of corn.  1 cup milk.   1 cup strawberries with   sugar-free whipped topping.  Carbohydrate Calculation Step 1: Identify the foods that contain carbohydrates:   Rice.   Corn.   Milk.   Strawberries. Step 2:Calculate the number of servings eaten of each:   2 servings of rice.   1 serving of corn.   1 serving of milk.   1 serving of strawberries. Step 3: Multiply each of those number of servings by 15 g:   2 servings of rice x 15 g = 30 g.   1 serving of corn x 15 g = 15 g.   1 serving of milk x 15 g = 15 g.   1 serving of strawberries x 15 g = 15 g. Step 4: Add  together all of the amounts to find the total grams of carbohydrates eaten: 30 g + 15 g + 15 g + 15 g = 75 g.   This information is not intended to replace advice given to you by your health care provider. Make sure you discuss any questions you have with your health care provider.   Document Released: 05/24/2005 Document Revised: 06/14/2014 Document Reviewed: 04/20/2013 Elsevier Interactive Patient Education 2016 Elsevier Inc. Hidradenitis Suppurativa Hidradenitis suppurativa is a long-term (chronic) skin disease that starts with blocked sweat glands or hair follicles. Bacteria may grow in these blocked openings of your skin. Hidradenitis suppurativa is like a severe form of acne that develops in areas of your body where acne would be unusual. It is most likely to affect the areas of your body where skin rubs against skin and becomes moist. This includes your:  Underarms.  Groin.  Genital areas.  Buttocks.  Upper thighs.  Breasts. Hidradenitis suppurativa may start out with small pimples. The pimples can develop into deep sores that break open (rupture) and drain pus. Over time your skin may thicken and become scarred. Hidradenitis suppurativa cannot be passed from person to person.  CAUSES  The exact cause of hidradenitis suppurativa is not known. This condition may be due to:  Female and female hormones. The condition is rare before and after puberty.  An overactive body defense system (immune system). Your immune system may overreact to the blocked hair follicles or sweat glands and cause swelling and pus-filled sores. RISK FACTORS You may have a higher risk of hidradenitis suppurativa if you:  Are a woman.  Are between ages 51 and 56.  Have a family history of hidradenitis suppurativa.  Have a personal history of acne.  Are overweight.  Smoke.  Take the drug lithium. SIGNS AND SYMPTOMS  The first signs of an outbreak are usually painful skin bumps that look like  pimples. As the condition progresses:  Skin bumps may get bigger and grow deeper into the skin.  Bumps under the skin may rupture and drain smelly pus.  Skin may become itchy and infected.  Skin may thicken and scar.  Drainage may continue through tunnels under the skin (fistulas).  Walking and moving your arms can become painful. DIAGNOSIS  Your health care provider may diagnose hidradenitis suppurativa based on your medical history and your signs and symptoms. A physical exam will also be done. You may need to see a health care provider who specializes in skin diseases (dermatologist). You may also have tests done to confirm the diagnosis. These can include:  Swabbing a sample of pus or drainage from your skin so it can be sent to the lab and tested for infection.  Blood tests to check for infection. TREATMENT  The same  treatment will not work for everybody with hidradenitis suppurativa. Your treatment will depend on how severe your symptoms are. You may need to try several treatments to find what works best for you. Part of your treatment may include cleaning and bandaging (dressing) your wounds. You may also have to take medicines, such as the following:  Antibiotics.  Acne medicines.  Medicines to block or suppress the immune system.  A diabetes medicine (metformin) is sometimes used to treat this condition.  For women, birth control pills can sometimes help relieve symptoms. You may need surgery if you have a severe case of hidradenitis suppurativa that does not respond to medicine. Surgery may involve:   Using a laser to clear the skin and remove hair follicles.  Opening and draining deep sores.  Removing the areas of skin that are diseased and scarred. HOME CARE INSTRUCTIONS  Learn as much as you can about your disease, and work closely with your health care providers.  Take medicines only as directed by your health care provider.  If you were prescribed an  antibiotic medicine, finish it all even if you start to feel better.  If you are overweight, losing weight may be very helpful. Try to reach and maintain a healthy weight.  Do not use any tobacco products, including cigarettes, chewing tobacco, or electronic cigarettes. If you need help quitting, ask your health care provider.  Do not shave the areas where you get hidradenitis suppurativa.  Do not wear deodorant.  Wear loose-fitting clothes.  Try not to overheat and get sweaty.  Take a daily bleach bath as directed by your health care provider.  Fill your bathtub halfway with water.  Pour in  cup of unscented household bleach.  Soak for 5-10 minutes.  Cover sore areas with a warm, clean washcloth (compress) for 5-10 minutes. SEEK MEDICAL CARE IF:   You have a flare-up of hidradenitis suppurativa.  You have chills or a fever.  You are having trouble controlling your symptoms at home.   This information is not intended to replace advice given to you by your health care provider. Make sure you discuss any questions you have with your health care provider.   Document Released: 01/06/2004 Document Revised: 06/14/2014 Document Reviewed: 08/24/2013 Elsevier Interactive Patient Education [employer] Elsevier Inc.

## 2015-08-20 NOTE — Progress Notes (Signed)
    Subjective:    Patient ID: Carmen [name] is a 55 y.o. female presenting with Recurrent Skin Infections and dry scalp  on 08/20/2015  HPI: Seen in February by Dr. Sampson GoonFitzgerald. Getting boils on her chin. Now has left sided vaginal knot. On right side there was several other knots. Reports head itching has been worse. Has changed shampoo and now. Reports BS are in the 150-200 range Last HgbA1C was 8.6. Diet is a problem   Review of Systems  Constitutional: Negative for fever and chills.  Respiratory: Negative for shortness of breath.   Cardiovascular: Negative for chest pain.  Gastrointestinal: Negative for nausea, vomiting and abdominal pain.  Genitourinary: Negative for dysuria.  Skin: Negative for rash.      Objective:    BP 151/86 mmHg  Pulse 75  Temp(Src) 98.2 F (36.8 C) (Oral)  Wt 227 lb (102.967 kg)  LMP 07/15/2015 (Exact Date) Physical Exam  Constitutional: She is oriented to person, place, and time. She appears well-developed and well-nourished. No distress.  HENT:  Head: Normocephalic and atraumatic.  Eyes: No scleral icterus.  Neck: Neck supple.  Cardiovascular: Normal rate.   Pulmonary/Chest: Effort normal.  Abdominal: Soft.  Genitourinary:  NEFG, no evidence of bumps seen or felt.  Neurological: She is alert and oriented to person, place, and time.  Skin: Skin is warm and dry.  Psychiatric: She has a normal mood and affect.   HgbA1C 8.6 06/2015     Assessment & Plan:   Problem List Items Addressed This Visit      High   HYPERTENSION, BENIGN SYSTEMIC - Primary (Chronic)    Continue atenolol, losartan and HCTZ. Not quite at goal but dietary indiscretions.      Diabetes mellitus without complication (HCC) (Chronic)    Not at goal, needs to work on diet and exercise. Continue amaryl, glucotrol.       Other Visit Diagnoses    Boil        topical antibacterial soap       Total face-to-face time with patient: 25 minutes. Over 50% of  encounter was spent on counseling and coordination of care. Return in about 3 months (around 11/20/2015). Needs pap at next visit.  Dabney Schanz S 08/20/2015 3:34 PM

## 2015-08-20 NOTE — Assessment & Plan Note (Signed)
Continue atenolol, losartan and HCTZ. Not quite at goal but dietary indiscretions.

## 2015-08-20 NOTE — Assessment & Plan Note (Signed)
Not at goal, needs to work on diet and exercise. Continue amaryl, glucotrol.

## 2015-09-04 ENCOUNTER — Ambulatory Visit: Payer: Self-pay

## 2015-10-23 ENCOUNTER — Encounter: Payer: Self-pay | Admitting: Family Medicine

## 2015-10-23 ENCOUNTER — Ambulatory Visit (INDEPENDENT_AMBULATORY_CARE_PROVIDER_SITE_OTHER): Payer: Self-pay | Admitting: Family Medicine

## 2015-10-23 VITALS — BP 160/103 | HR 74 | Temp 98.5°F | Ht 63.0 in | Wt 228.4 lb

## 2015-10-23 DIAGNOSIS — M25562 Pain in left knee: Secondary | ICD-10-CM

## 2015-10-23 DIAGNOSIS — M159 Polyosteoarthritis, unspecified: Secondary | ICD-10-CM

## 2015-10-23 DIAGNOSIS — M150 Primary generalized (osteo)arthritis: Secondary | ICD-10-CM

## 2015-10-23 NOTE — Patient Instructions (Signed)
Thank you for coming in to clinic today.  1. 1. For your Knee Pain - I think this is a flare of arthritis, chronic problem but now painful flare. No evidence of other injury  2. Start with anti-inflammatory Iburpofen 600-800mg  every 6 to 8 hours (3 times a day) for up to 1 week, maybe longer if needed then STOP, with food, breakfast and dinner) then can use only as needed  3. May use Tylenol Extra Str 500mg  tabs - may take 1-2 tablets every 6 hours as needed  4. Recommend to start using heating pad on your lower back 1-2x daily for few weeks. Use ice packs on Knee - use Knee compression sleeve as well if you have one, this can help  This pain may take weeks to months to fully resolve, but hopefully it will respond to the medicine initially. Be careful with turning, twisting, lifting, sitting / standing for prolonged periods, and avoid re-injury.  Please schedule a follow-up appointment with Dr Shawnie PonsPratt within 1 month as scheduled, Arthritis, Discuss future X-rays, steroid injection, or PT  If you have any other questions or concerns, please feel free to call the clinic to contact me. You may also schedule an earlier appointment if necessary.  However, if your symptoms get significantly worse, please go to the Emergency Department to seek immediate medical attention.  Saralyn PilarAlexander Relena Ivancic, DO [facility]

## 2015-10-23 NOTE — Progress Notes (Signed)
Subjective:    Patient ID: Carmen [name], female    DOB: 02/28/1961, 55 y.o.   MRN: [id number]  Carmen [name] is a 55 y.o. female presenting on 10/23/2015 for left knee pain  Patient presents for a same day appointment.   HPI  LEFT KNEE PAIN / H/o OSTEOARTHRITIS - Reports symptoms with aching and pain started to get worse after Monday (had slept on couch over weekend), without known trauma or injury, seems to be tolerable most of the time but occasionally worsens in severity, able to ambulate but does have a "limp" and complains about feeling her knee frequently making "popping" sounds but not locking or sticking causing it to buckle or give way - history of arthritis, LBP with prior OA/DJD last X-ray L-spine 02/2015, also history of arthritis in other sites reportedly both knees in past (x-rays prior to 2004), and some R-shoulder  - Previously did some PT for R-shoulder, had established with Orthopedics in past not followed recently for her knees - Prior NSAIDs, now currently Ibuprofen 800mg  x 1 dose usually, infrequently maybe 1x weekly, not taking Tylenol. Tried topical heating pad for back or shoulder - Had prior steroid injection in shoulder, not interested in this today for knee - Denies swelling, redness, injury or trauma, weakness, numbness, tingling  Social History  Substance Use Topics  . Smoking status: Former Smoker -- 0.25 packs/day for 30 years    Types: Cigarettes    Quit date: 08/13/2007  . Smokeless tobacco: Never Used     Comment: boyfriend smokes  . Alcohol Use: No    Review of Systems Per HPI unless specifically indicated above     Objective:    BP 160/103 mmHg  Pulse 74  Temp(Src) 98.5 F (36.9 C) (Oral)  Ht 5\' 3"  (1.6 m)  Wt 228 lb 6.4 oz (103.602 kg)  BMI 40.47 kg/m2  Wt Readings from Last 3 Encounters:  10/23/15 228 lb 6.4 oz (103.602 kg)  08/20/15 227 lb (102.967 kg)  06/20/15 219 lb 11.2 oz (99.655 kg)    Physical Exam    Constitutional: She appears well-developed and well-nourished. No distress.  Well-appearing, comfortable, cooperative  Musculoskeletal: She exhibits no edema.  Left Knee Inspection: Bilateral bulky appearance and symmetrical. No ecchymosis or joint effusion. Some trace soft tissue edema around knees medial > lateral. Palpation: Mild +TTP Left knee only medial joint line. Some crepitus bilateral not too significant. ROM: Mostly full active ROM some limited L knee flexion Special Testing: Lachman / Valgus/Varus tests negative with intact ligaments (ACL, MCL, LCL). McMurray on Left with mild discomfort medial stress but not consistent with popping or positive test. Standing Thesslay for meniscus only mild discomfort on Left again but able to perform bilateral Strength: 5/5 intact knee flex/ext, ankle dorsi/plantarflex Neurovascular: distally intact sensation light touch and pulses   Neurological: She is alert.  Skin: Skin is warm and dry. She is not diaphoretic.  Nursing note and vitals reviewed.      Assessment & Plan:   Problem List Items Addressed This Visit    Osteoarthritis of multiple joints - Primary    Likely underlying etiology for Left knee acute on chronic pain, with OA flare. 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 Seems some inadequate conservative treatments only doing NSAID very infrequent, no Tylenol, no regular exercise regimen. - Counseled on NSAID burst dose for flare 1-2 week then stop - Tylenol PRN - Regular walking, stay active - May benefit from  PT / focused exercise regimen - Follow-up with PCP as planned      Left medial knee pain    Medial compartment Left knee acute on chronic pain, with likely OA flare. 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 - See above OA recommendations - Counseled on NSAID (home ibuprofen) 1-2 weeks, Tylenol PRN, stay active - RICE therapy for knee,  May use compression sleeve for activities - Future consider steroid injection in knee vs may be candidate for lubricating hyaluronic inj - Offered bilateral knee x-rays for baseline and update, patient declined today - May benefit from PT / focused exercise regimen - Follow-up with PCP as planned         No orders of the defined types were placed in this encounter.      Follow up plan: Return in about 4 weeks (around 11/20/2015) for left knee OA.  Saralyn Pilar, DO [facility], PGY-3

## 2015-10-24 DIAGNOSIS — M25562 Pain in left knee: Secondary | ICD-10-CM | POA: Insufficient documentation

## 2015-10-24 NOTE — Assessment & Plan Note (Signed)
Medial compartment Left knee acute on chronic pain, with likely OA flare. 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 - See above OA recommendations - Counseled on NSAID (home ibuprofen) 1-2 weeks, Tylenol PRN, stay active - RICE therapy for knee,  May use compression sleeve for activities - Future consider steroid injection in knee vs may be candidate for lubricating hyaluronic inj - Offered bilateral knee x-rays for baseline and update, patient declined today - May benefit from PT / focused exercise regimen - Follow-up with PCP as planned

## 2015-10-24 NOTE — Assessment & Plan Note (Addendum)
Likely underlying etiology for Left knee acute on chronic pain, with OA flare. 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 Seems some inadequate conservative treatments only doing NSAID very infrequent, no Tylenol, no regular exercise regimen. - Counseled on NSAID burst dose for flare 1-2 week then stop - Tylenol PRN - Regular walking, stay active - May benefit from PT / focused exercise regimen - Follow-up with PCP as planned

## 2015-11-12 ENCOUNTER — Ambulatory Visit (INDEPENDENT_AMBULATORY_CARE_PROVIDER_SITE_OTHER): Payer: Self-pay | Admitting: Family Medicine

## 2015-11-12 ENCOUNTER — Other Ambulatory Visit (HOSPITAL_COMMUNITY)
Admission: RE | Admit: 2015-11-12 | Discharge: 2015-11-12 | Disposition: A | Discharge status: Home / Self Care | Payer: Self-pay | Source: Ambulatory Visit | Attending: Family Medicine | Admitting: Family Medicine

## 2015-11-12 ENCOUNTER — Encounter: Payer: Self-pay | Admitting: Family Medicine

## 2015-11-12 VITALS — BP 142/76 | HR 80 | Temp 98.2°F | Wt 226.0 lb

## 2015-11-12 DIAGNOSIS — N912 Amenorrhea, unspecified: Secondary | ICD-10-CM

## 2015-11-12 DIAGNOSIS — E119 Type 2 diabetes mellitus without complications: Secondary | ICD-10-CM

## 2015-11-12 DIAGNOSIS — M25562 Pain in left knee: Secondary | ICD-10-CM

## 2015-11-12 DIAGNOSIS — I10 Essential (primary) hypertension: Secondary | ICD-10-CM

## 2015-11-12 DIAGNOSIS — Z124 Encounter for screening for malignant neoplasm of cervix: Secondary | ICD-10-CM

## 2015-11-12 DIAGNOSIS — Z1151 Encounter for screening for human papillomavirus (HPV): Secondary | ICD-10-CM | POA: Insufficient documentation

## 2015-11-12 DIAGNOSIS — M150 Primary generalized (osteo)arthritis: Secondary | ICD-10-CM

## 2015-11-12 DIAGNOSIS — M159 Polyosteoarthritis, unspecified: Secondary | ICD-10-CM

## 2015-11-12 DIAGNOSIS — Z01419 Encounter for gynecological examination (general) (routine) without abnormal findings: Secondary | ICD-10-CM | POA: Insufficient documentation

## 2015-11-12 DIAGNOSIS — Z0000 Encounter for general adult medical examination without abnormal findings: Secondary | ICD-10-CM

## 2015-11-12 LAB — CBC
HEMATOCRIT: 43.9 % (ref 35.0–45.0)
HEMOGLOBIN: 15.4 g/dL (ref 11.7–15.5)
MCH: 30.4 pg (ref 27.0–33.0)
MCHC: 35.1 g/dL (ref 32.0–36.0)
MCV: 86.6 fL (ref 80.0–100.0)
MPV: 10.3 fL (ref 7.5–12.5)
Platelets: 251 10*3/uL (ref 140–400)
RBC: 5.07 MIL/uL (ref 3.80–5.10)
RDW: 14.1 % (ref 11.0–15.0)
WBC: 9.3 10*3/uL (ref 3.8–10.8)

## 2015-11-12 LAB — COMPREHENSIVE METABOLIC PANEL
ALBUMIN: 4.2 g/dL (ref 3.6–5.1)
ALK PHOS: 77 U/L (ref 33–130)
ALT: 25 U/L (ref 6–29)
AST: 17 U/L (ref 10–35)
BILIRUBIN TOTAL: 0.5 mg/dL (ref 0.2–1.2)
BUN: 17 mg/dL (ref 7–25)
CALCIUM: 8.6 mg/dL (ref 8.6–10.4)
CO2: 22 mmol/L (ref 20–31)
Chloride: 95 mmol/L — ABNORMAL LOW (ref 98–110)
Creat: 0.8 mg/dL (ref 0.50–1.05)
GLUCOSE: 285 mg/dL — AB (ref 65–99)
POTASSIUM: 3.4 mmol/L — AB (ref 3.5–5.3)
Sodium: 134 mmol/L — ABNORMAL LOW (ref 135–146)
TOTAL PROTEIN: 6.9 g/dL (ref 6.1–8.1)

## 2015-11-12 LAB — LIPID PANEL
Cholesterol: 186 mg/dL (ref 125–200)
HDL: 50 mg/dL (ref >=46)
LDL CALC: 65 mg/dL (ref <130)
TRIGLYCERIDES: 356 mg/dL — AB (ref <150)
Total CHOL/HDL Ratio: 3.7 Ratio (ref <=5.0)
VLDL: 71 mg/dL — ABNORMAL HIGH (ref <30)

## 2015-11-12 LAB — POCT GLYCOSYLATED HEMOGLOBIN (HGB A1C): Hemoglobin A1C: 9.8

## 2015-11-12 LAB — TSH: TSH: 3.28 m[IU]/L

## 2015-11-12 MED ORDER — LOSARTAN POTASSIUM 100 MG PO TABS: 100.0000 mg | ORAL_TABLET | Freq: Every day | ORAL | Status: DC

## 2015-11-12 MED ORDER — GLIPIZIDE 10 MG PO TABS: ORAL_TABLET | ORAL | Status: DC

## 2015-11-12 NOTE — Patient Instructions (Addendum)
Basic Carbohydrate Counting for Diabetes Mellitus Carbohydrate counting is a method for keeping track of the amount of carbohydrates you eat. Eating carbohydrates naturally increases the level of sugar (glucose) in your blood, so it is important for you to know the amount that is okay for you to have in every meal. Carbohydrate counting helps keep the level of glucose in your blood within normal limits. The amount of carbohydrates allowed is different for every person. A dietitian can help you calculate the amount that is right for you. Once you know the amount of carbohydrates you can have, you can count the carbohydrates in the foods you want to eat. Carbohydrates are found in the following foods:  Grains, such as breads and cereals.  Dried beans and soy products.  Starchy vegetables, such as potatoes, peas, and corn.  Fruit and fruit juices.  Milk and yogurt.  Sweets and snack foods, such as cake, cookies, candy, chips, soft drinks, and fruit drinks. CARBOHYDRATE COUNTING There are two ways to count the carbohydrates in your food. You can use either of the methods or a combination of both. Reading the "Nutrition Facts" on [place] The "Nutrition Facts" is an area that is included on the labels of almost all packaged food and beverages in the [place]. It includes the serving size of that food or beverage and information about the nutrients in each serving of the food, including the grams (g) of carbohydrate per serving.  Decide the number of servings of this food or beverage that you will be able to eat or drink. Multiply that number of servings by the number of grams of carbohydrate that is listed on the label for that serving. The total will be the amount of carbohydrates you will be having when you eat or drink this food or beverage. Learning Standard Serving Sizes of Food When you eat food that is not packaged or does not include "Nutrition Facts" on the label, you need to  measure the servings in order to count the amount of carbohydrates.A serving of most carbohydrate-rich foods contains about 15 g of carbohydrates. The following list includes serving sizes of carbohydrate-rich foods that provide 15 g ofcarbohydrate per serving:   1 slice of bread (1 oz) or 1 six-inch tortilla.    of a hamburger bun or English muffin.  4-6 crackers.   cup unsweetened dry cereal.    cup hot cereal.   cup rice or pasta.    cup mashed potatoes or  of a large baked potato.  1 cup fresh fruit or one small piece of fruit.    cup canned or frozen fruit or fruit juice.  1 cup milk.   cup plain fat-free yogurt or yogurt sweetened with artificial sweeteners.   cup cooked dried beans or starchy vegetable, such as peas, corn, or potatoes.  Decide the number of standard-size servings that you will eat. Multiply that number of servings by 15 (the grams of carbohydrates in that serving). For example, if you eat 2 cups of strawberries, you will have eaten 2 servings and 30 g of carbohydrates (2 servings x 15 g = 30 g). For foods such as soups and casseroles, in which more than one food is mixed in, you will need to count the carbohydrates in each food that is included. EXAMPLE OF CARBOHYDRATE COUNTING Sample Dinner  3 oz chicken breast.   cup of brown rice.   cup of corn.  1 cup milk.   1 cup strawberries with  sugar-free whipped topping.  Carbohydrate Calculation Step 1: Identify the foods that contain carbohydrates:   Rice.   Corn.   Milk.   Strawberries. Step 2:Calculate the number of servings eaten of each:   2 servings of rice.   1 serving of corn.   1 serving of milk.   1 serving of strawberries. Step 3: Multiply each of those number of servings by 15 g:   2 servings of rice x 15 g = 30 g.   1 serving of corn x 15 g = 15 g.   1 serving of milk x 15 g = 15 g.   1 serving of strawberries x 15 g = 15 g. Step 4: Add  together all of the amounts to find the total grams of carbohydrates eaten: 30 g + 15 g + 15 g + 15 g = 75 g.   This information is not intended to replace advice given to you by your health care provider. Make sure you discuss any questions you have with your health care provider.   Document Released: 05/24/2005 Document Revised: 06/14/2014 Document Reviewed: 04/20/2013 Elsevier Interactive Patient Education 2016 [employer] Preventive Care for Adults, Female A healthy lifestyle and preventive care can promote health and wellness. Preventive health guidelines for women include the following key practices.  A routine yearly physical is a good way to check with your health care provider about your health and preventive screening. It is a chance to share any concerns and updates on your health and to receive a thorough exam.  Visit your dentist for a routine exam and preventive care every 6 months. Brush your teeth twice a day and floss once a day. Good oral hygiene prevents tooth decay and gum disease.  The frequency of eye exams is based on your age, health, family medical history, use of contact lenses, and other factors. Follow your health care provider's recommendations for frequency of eye exams.  Eat a healthy diet. Foods like vegetables, fruits, whole grains, low-fat dairy products, and lean protein foods contain the nutrients you need without too many calories. Decrease your intake of foods high in solid fats, added sugars, and salt. Eat the right amount of calories for you.Get information about a proper diet from your health care provider, if necessary.  Regular physical exercise is one of the most important things you can do for your health. Most adults should get at least 150 minutes of moderate-intensity exercise (any activity that increases your heart rate and causes you to sweat) each week. In addition, most adults need muscle-strengthening exercises on 2 or more days a  week.  Maintain a healthy weight. The body mass index (BMI) is a screening tool to identify possible weight problems. It provides an estimate of body fat based on height and weight. Your health care provider can find your BMI and can help you achieve or maintain a healthy weight.For adults 20 years and older:  A BMI below 18.5 is considered underweight.  A BMI of 18.5 to 24.9 is normal.  A BMI of 25 to 29.9 is considered overweight.  A BMI of 30 and above is considered obese.  Maintain normal blood lipids and cholesterol levels by exercising and minimizing your intake of saturated fat. Eat a balanced diet with plenty of fruit and vegetables. Blood tests for lipids and cholesterol should begin at age 20 and be repeated every 5 years. If your lipid or cholesterol levels are high, you are over 50, or you are   at high risk for heart disease, you may need your cholesterol levels checked more frequently.Ongoing high lipid and cholesterol levels should be treated with medicines if diet and exercise are not working.  If you smoke, find out from your health care provider how to quit. If you do not use tobacco, do not start.  Lung cancer screening is recommended for adults aged 55-80 years who are at high risk for developing lung cancer because of a history of smoking. A yearly low-dose CT scan of the lungs is recommended for people who have at least a 30-pack-year history of smoking and are a current smoker or have quit within the past 15 years. A pack year of smoking is smoking an average of 1 pack of cigarettes a day for 1 year (for example: 1 pack a day for 30 years or 2 packs a day for 15 years). Yearly screening should continue until the smoker has stopped smoking for at least 15 years. Yearly screening should be stopped for people who develop a health problem that would prevent them from having lung cancer treatment.  If you are pregnant, do not drink alcohol. If you are breastfeeding, be very  cautious about drinking alcohol. If you are not pregnant and choose to drink alcohol, do not have more than 1 drink per day. One drink is considered to be 12 ounces (355 mL) of beer, 5 ounces (148 mL) of wine, or 1.5 ounces (44 mL) of liquor.  Avoid use of street drugs. Do not share needles with anyone. Ask for help if you need support or instructions about stopping the use of drugs.  High blood pressure causes heart disease and increases the risk of stroke. Your blood pressure should be checked at least every 1 to 2 years. Ongoing high blood pressure should be treated with medicines if weight loss and exercise do not work.  If you are 55-79 years old, ask your health care provider if you should take aspirin to prevent strokes.  Diabetes screening is done by taking a blood sample to check your blood glucose level after you have not eaten for a certain period of time (fasting). If you are not overweight and you do not have risk factors for diabetes, you should be screened once every 3 years starting at age 45. If you are overweight or obese and you are 40-70 years of age, you should be screened for diabetes every year as part of your cardiovascular risk assessment.  Breast cancer screening is essential preventive care for women. You should practice "breast self-awareness." This means understanding the normal appearance and feel of your breasts and may include breast self-examination. Any changes detected, no matter how small, should be reported to a health care provider. Women in their 20s and 30s should have a clinical breast exam (CBE) by a health care provider as part of a regular health exam every 1 to 3 years. After age 40, women should have a CBE every year. Starting at age 40, women should consider having a mammogram (breast X-ray test) every year. Women who have a family history of breast cancer should talk to their health care provider about genetic screening. Women at a high risk of breast cancer  should talk to their health care providers about having an MRI and a mammogram every year.  Breast cancer gene (BRCA)-related cancer risk assessment is recommended for women who have family members with BRCA-related cancers. BRCA-related cancers include breast, ovarian, tubal, and peritoneal cancers. Having family members with   these cancers may be associated with an increased risk for harmful changes (mutations) in the breast cancer genes BRCA1 and BRCA2. Results of the assessment will determine the need for genetic counseling and BRCA1 and BRCA2 testing.  Your health care provider may recommend that you be screened regularly for cancer of the pelvic organs (ovaries, uterus, and vagina). This screening involves a pelvic examination, including checking for microscopic changes to the surface of your cervix (Pap test). You may be encouraged to have this screening done every 3 years, beginning at age 21.  For women ages 30-65, health care providers may recommend pelvic exams and Pap testing every 3 years, or they may recommend the Pap and pelvic exam, combined with testing for human papilloma virus (HPV), every 5 years. Some types of HPV increase your risk of cervical cancer. Testing for HPV may also be done on women of any age with unclear Pap test results.  Other health care providers may not recommend any screening for nonpregnant women who are considered low risk for pelvic cancer and who do not have symptoms. Ask your health care provider if a screening pelvic exam is right for you.  If you have had past treatment for cervical cancer or a condition that could lead to cancer, you need Pap tests and screening for cancer for at least 20 years after your treatment. If Pap tests have been discontinued, your risk factors (such as having a new sexual partner) need to be reassessed to determine if screening should resume. Some women have medical problems that increase the chance of getting cervical cancer. In  these cases, your health care provider may recommend more frequent screening and Pap tests.  Colorectal cancer can be detected and often prevented. Most routine colorectal cancer screening begins at the age of 50 years and continues through age 75 years. However, your health care provider may recommend screening at an earlier age if you have risk factors for colon cancer. On a yearly basis, your health care provider may provide home test kits to check for hidden blood in the stool. Use of a small camera at the end of a tube, to directly examine the colon (sigmoidoscopy or colonoscopy), can detect the earliest forms of colorectal cancer. Talk to your health care provider about this at age 50, when routine screening begins. Direct exam of the colon should be repeated every 5-10 years through age 75 years, unless early forms of precancerous polyps or small growths are found.  People who are at an increased risk for hepatitis B should be screened for this virus. You are considered at high risk for hepatitis B if:  You were born in a country where hepatitis B occurs often. Talk with your health care provider about which countries are considered high risk.  Your parents were born in a high-risk country and you have not received a shot to protect against hepatitis B (hepatitis B vaccine).  You have HIV or AIDS.  You use needles to inject street drugs.  You live with, or have sex with, someone who has hepatitis B.  You get hemodialysis treatment.  You take certain medicines for conditions like cancer, organ transplantation, and autoimmune conditions.  Hepatitis C blood testing is recommended for all people born from 1945 through 1965 and any individual with known risks for hepatitis C.  Practice safe sex. Use condoms and avoid high-risk sexual practices to reduce the spread of sexually transmitted infections (STIs). STIs include gonorrhea, chlamydia, syphilis, trichomonas, herpes, HPV, and   human  immunodeficiency virus (HIV). Herpes, HIV, and HPV are viral illnesses that have no cure. They can result in disability, cancer, and death.  You should be screened for sexually transmitted illnesses (STIs) including gonorrhea and chlamydia if:  You are sexually active and are younger than 24 years.  You are older than 24 years and your health care provider tells you that you are at risk for this type of infection.  Your sexual activity has changed since you were last screened and you are at an increased risk for chlamydia or gonorrhea. Ask your health care provider if you are at risk.  If you are at risk of being infected with HIV, it is recommended that you take a prescription medicine daily to prevent HIV infection. This is called preexposure prophylaxis (PrEP). You are considered at risk if:  You are sexually active and do not regularly use condoms or know the HIV status of your partner(s).  You take drugs by injection.  You are sexually active with a partner who has HIV.  Talk with your health care provider about whether you are at high risk of being infected with HIV. If you choose to begin PrEP, you should first be tested for HIV. You should then be tested every 3 months for as long as you are taking PrEP.  Osteoporosis is a disease in which the bones lose minerals and strength with aging. This can result in serious bone fractures or breaks. The risk of osteoporosis can be identified using a bone density scan. Women ages 4 years and over and women at risk for fractures or osteoporosis should discuss screening with their health care providers. Ask your health care provider whether you should take a calcium supplement or vitamin D to reduce the rate of osteoporosis.  Menopause can be associated with physical symptoms and risks. Hormone replacement therapy is available to decrease symptoms and risks. You should talk to your health care provider about whether hormone replacement therapy is  right for you.  Use sunscreen. Apply sunscreen liberally and repeatedly throughout the day. You should seek shade when your shadow is shorter than you. Protect yourself by wearing long sleeves, pants, a wide-brimmed hat, and sunglasses year round, whenever you are outdoors.  Once a month, do a whole body skin exam, using a mirror to look at the skin on your back. Tell your health care provider of new moles, moles that have irregular borders, moles that are larger than a pencil eraser, or moles that have changed in shape or color.  Stay current with required vaccines (immunizations).  Influenza vaccine. All adults should be immunized every year.  Tetanus, diphtheria, and acellular pertussis (Td, Tdap) vaccine. Pregnant women should receive 1 dose of Tdap vaccine during each pregnancy. The dose should be obtained regardless of the length of time since the last dose. Immunization is preferred during the 27th-36th week of gestation. An adult who has not previously received Tdap or who does not know her vaccine status should receive 1 dose of Tdap. This initial dose should be followed by tetanus and diphtheria toxoids (Td) booster doses every 10 years. Adults with an unknown or incomplete history of completing a 3-dose immunization series with Td-containing vaccines should begin or complete a primary immunization series including a Tdap dose. Adults should receive a Td booster every 10 years.  Varicella vaccine. An adult without evidence of immunity to varicella should receive 2 doses or a second dose if she has previously received 1 dose. Pregnant  females who do not have evidence of immunity should receive the first dose after pregnancy. This first dose should be obtained before leaving the health care facility. The second dose should be obtained 4-8 weeks after the first dose.  Human papillomavirus (HPV) vaccine. Females aged 13-26 years who have not received the vaccine previously should obtain the  3-dose series. The vaccine is not recommended for use in pregnant females. However, pregnancy testing is not needed before receiving a dose. If a female is found to be pregnant after receiving a dose, no treatment is needed. In that case, the remaining doses should be delayed until after the pregnancy. Immunization is recommended for any person with an immunocompromised condition through the age of 26 years if she did not get any or all doses earlier. During the 3-dose series, the second dose should be obtained 4-8 weeks after the first dose. The third dose should be obtained 24 weeks after the first dose and 16 weeks after the second dose.  Zoster vaccine. One dose is recommended for adults aged 60 years or older unless certain conditions are present.  Measles, mumps, and rubella (MMR) vaccine. Adults born before 1957 generally are considered immune to measles and mumps. Adults born in 1957 or later should have 1 or more doses of MMR vaccine unless there is a contraindication to the vaccine or there is laboratory evidence of immunity to each of the three diseases. A routine second dose of MMR vaccine should be obtained at least 28 days after the first dose for students attending postsecondary schools, health care workers, or international travelers. People who received inactivated measles vaccine or an unknown type of measles vaccine during 1963-1967 should receive 2 doses of MMR vaccine. People who received inactivated mumps vaccine or an unknown type of mumps vaccine before 1979 and are at high risk for mumps infection should consider immunization with 2 doses of MMR vaccine. For females of childbearing age, rubella immunity should be determined. If there is no evidence of immunity, females who are not pregnant should be vaccinated. If there is no evidence of immunity, females who are pregnant should delay immunization until after pregnancy. Unvaccinated health care workers born before 1957 who lack  laboratory evidence of measles, mumps, or rubella immunity or laboratory confirmation of disease should consider measles and mumps immunization with 2 doses of MMR vaccine or rubella immunization with 1 dose of MMR vaccine.  Pneumococcal 13-valent conjugate (PCV13) vaccine. When indicated, a person who is uncertain of his immunization history and has no record of immunization should receive the PCV13 vaccine. All adults 65 years of age and older should receive this vaccine. An adult aged 19 years or older who has certain medical conditions and has not been previously immunized should receive 1 dose of PCV13 vaccine. This PCV13 should be followed with a dose of pneumococcal polysaccharide (PPSV23) vaccine. Adults who are at high risk for pneumococcal disease should obtain the PPSV23 vaccine at least 8 weeks after the dose of PCV13 vaccine. Adults older than 54 years of age who have normal immune system function should obtain the PPSV23 vaccine dose at least 1 year after the dose of PCV13 vaccine.  Pneumococcal polysaccharide (PPSV23) vaccine. When PCV13 is also indicated, PCV13 should be obtained first. All adults aged 65 years and older should be immunized. An adult younger than age 65 years who has certain medical conditions should be immunized. Any person who resides in a nursing home or long-term care facility should be immunized.   An adult smoker should be immunized. People with an immunocompromised condition and certain other conditions should receive both PCV13 and PPSV23 vaccines. People with human immunodeficiency virus (HIV) infection should be immunized as soon as possible after diagnosis. Immunization during chemotherapy or radiation therapy should be avoided. Routine use of PPSV23 vaccine is not recommended for American Indians, [place] Natives, or people younger than 65 years unless there are medical conditions that require PPSV23 vaccine. When indicated, people who have unknown immunization and have  no record of immunization should receive PPSV23 vaccine. One-time revaccination 5 years after the first dose of PPSV23 is recommended for people aged 19-64 years who have chronic kidney failure, nephrotic syndrome, asplenia, or immunocompromised conditions. People who received 1-2 doses of PPSV23 before age 65 years should receive another dose of PPSV23 vaccine at age 65 years or later if at least 5 years have passed since the previous dose. Doses of PPSV23 are not needed for people immunized with PPSV23 at or after age 65 years.  Meningococcal vaccine. Adults with asplenia or persistent complement component deficiencies should receive 2 doses of quadrivalent meningococcal conjugate (MenACWY-D) vaccine. The doses should be obtained at least 2 months apart. Microbiologists working with certain meningococcal bacteria, military recruits, people at risk during an outbreak, and people who travel to or live in countries with a high rate of meningitis should be immunized. A first-year college student up through age 21 years who is living in a residence hall should receive a dose if she did not receive a dose on or after her 16th birthday. Adults who have certain high-risk conditions should receive one or more doses of vaccine.  Hepatitis A vaccine. Adults who wish to be protected from this disease, have certain high-risk conditions, work with hepatitis A-infected animals, work in hepatitis A research labs, or travel to or work in countries with a high rate of hepatitis A should be immunized. Adults who were previously unvaccinated and who anticipate close contact with an international adoptee during the first 60 days after arrival in the [place] from a country with a high rate of hepatitis A should be immunized.  Hepatitis B vaccine. Adults who wish to be protected from this disease, have certain high-risk conditions, may be exposed to blood or other infectious body fluids, are household contacts or sex  partners of hepatitis B positive people, are clients or workers in certain care facilities, or travel to or work in countries with a high rate of hepatitis B should be immunized.  Haemophilus influenzae type b (Hib) vaccine. A previously unvaccinated person with asplenia or sickle cell disease or having a scheduled splenectomy should receive 1 dose of Hib vaccine. Regardless of previous immunization, a recipient of a hematopoietic stem cell transplant should receive a 3-dose series 6-12 months after her successful transplant. Hib vaccine is not recommended for adults with HIV infection. Preventive Services / Frequency Ages 19 to 39 years  Blood pressure check.** / Every 3-5 years.  Lipid and cholesterol check.** / Every 5 years beginning at age 20.  Clinical breast exam.** / Every 3 years for women in their 20s and 30s.  BRCA-related cancer risk assessment.** / For women who have family members with a BRCA-related cancer (breast, ovarian, tubal, or peritoneal cancers).  Pap test.** / Every 2 years from ages 21 through 29. Every 3 years starting at age 30 through age 65 or 70 with a history of 3 consecutive normal Pap tests.  HPV screening.** / Every 3 years from   ages 57 through ages 28 to 40 with a history of 3 consecutive normal Pap tests.  Hepatitis C blood test.** / For any individual with known risks for hepatitis C.  Skin self-exam. / Monthly.  Influenza vaccine. / Every year.  Tetanus, diphtheria, and acellular pertussis (Tdap, Td) vaccine.** / Consult your health care provider. Pregnant women should receive 1 dose of Tdap vaccine during each pregnancy. 1 dose of Td every 10 years.  Varicella vaccine.** / Consult your health care provider. Pregnant females who do not have evidence of immunity should receive the first dose after pregnancy.  HPV vaccine. / 3 doses over 6 months, if 28 and younger. The vaccine is not recommended for use in pregnant females. However, pregnancy testing  is not needed before receiving a dose.  Measles, mumps, rubella (MMR) vaccine.** / You need at least 1 dose of MMR if you were born in 1957 or later. You may also need a 2nd dose. For females of childbearing age, rubella immunity should be determined. If there is no evidence of immunity, females who are not pregnant should be vaccinated. If there is no evidence of immunity, females who are pregnant should delay immunization until after pregnancy.  Pneumococcal 13-valent conjugate (PCV13) vaccine.** / Consult your health care provider.  Pneumococcal polysaccharide (PPSV23) vaccine.** / 1 to 2 doses if you smoke cigarettes or if you have certain conditions.  Meningococcal vaccine.** / 1 dose if you are age 72 to 67 years and a Market researcher living in a residence hall, or have one of several medical conditions, you need to get vaccinated against meningococcal disease. You may also need additional booster doses.  Hepatitis A vaccine.** / Consult your health care provider.  Hepatitis B vaccine.** / Consult your health care provider.  Haemophilus influenzae type b (Hib) vaccine.** / Consult your health care provider. Ages 51 to 54 years  Blood pressure check.** / Every year.  Lipid and cholesterol check.** / Every 5 years beginning at age 72 years.  Lung cancer screening. / Every year if you are aged 21-80 years and have a 30-pack-year history of smoking and currently smoke or have quit within the past 15 years. Yearly screening is stopped once you have quit smoking for at least 15 years or develop a health problem that would prevent you from having lung cancer treatment.  Clinical breast exam.** / Every year after age 3 years.  BRCA-related cancer risk assessment.** / For women who have family members with a BRCA-related cancer (breast, ovarian, tubal, or peritoneal cancers).  Mammogram.** / Every year beginning at age 16 years and continuing for as long as you are in good  health. Consult with your health care provider.  Pap test.** / Every 3 years starting at age 90 years through age 23 or 16 years with a history of 3 consecutive normal Pap tests.  HPV screening.** / Every 3 years from ages 59 years through ages 60 to 59 years with a history of 3 consecutive normal Pap tests.  Fecal occult blood test (FOBT) of stool. / Every year beginning at age 102 years and continuing until age 61 years. You may not need to do this test if you get a colonoscopy every 10 years.  Flexible sigmoidoscopy or colonoscopy.** / Every 5 years for a flexible sigmoidoscopy or every 10 years for a colonoscopy beginning at age 63 years and continuing until age 87 years.  Hepatitis C blood test.** / For all people born from [birth year] through 1965  and any individual with known risks for hepatitis C.  Skin self-exam. / Monthly.  Influenza vaccine. / Every year.  Tetanus, diphtheria, and acellular pertussis (Tdap/Td) vaccine.** / Consult your health care provider. Pregnant women should receive 1 dose of Tdap vaccine during each pregnancy. 1 dose of Td every 10 years.  Varicella vaccine.** / Consult your health care provider. Pregnant females who do not have evidence of immunity should receive the first dose after pregnancy.  Zoster vaccine.** / 1 dose for adults aged 60 years or older.  Measles, mumps, rubella (MMR) vaccine.** / You need at least 1 dose of MMR if you were born in 1957 or later. You may also need a second dose. For females of childbearing age, rubella immunity should be determined. If there is no evidence of immunity, females who are not pregnant should be vaccinated. If there is no evidence of immunity, females who are pregnant should delay immunization until after pregnancy.  Pneumococcal 13-valent conjugate (PCV13) vaccine.** / Consult your health care provider.  Pneumococcal polysaccharide (PPSV23) vaccine.** / 1 to 2 doses if you smoke cigarettes or if you have certain  conditions.  Meningococcal vaccine.** / Consult your health care provider.  Hepatitis A vaccine.** / Consult your health care provider.  Hepatitis B vaccine.** / Consult your health care provider.  Haemophilus influenzae type b (Hib) vaccine.** / Consult your health care provider. Ages 65 years and over  Blood pressure check.** / Every year.  Lipid and cholesterol check.** / Every 5 years beginning at age 20 years.  Lung cancer screening. / Every year if you are aged 55-80 years and have a 30-pack-year history of smoking and currently smoke or have quit within the past 15 years. Yearly screening is stopped once you have quit smoking for at least 15 years or develop a health problem that would prevent you from having lung cancer treatment.  Clinical breast exam.** / Every year after age 40 years.  BRCA-related cancer risk assessment.** / For women who have family members with a BRCA-related cancer (breast, ovarian, tubal, or peritoneal cancers).  Mammogram.** / Every year beginning at age 40 years and continuing for as long as you are in good health. Consult with your health care provider.  Pap test.** / Every 3 years starting at age 30 years through age 65 or 70 years with 3 consecutive normal Pap tests. Testing can be stopped between 65 and 70 years with 3 consecutive normal Pap tests and no abnormal Pap or HPV tests in the past 10 years.  HPV screening.** / Every 3 years from ages 30 years through ages 65 or 70 years with a history of 3 consecutive normal Pap tests. Testing can be stopped between 65 and 70 years with 3 consecutive normal Pap tests and no abnormal Pap or HPV tests in the past 10 years.  Fecal occult blood test (FOBT) of stool. / Every year beginning at age 50 years and continuing until age 75 years. You may not need to do this test if you get a colonoscopy every 10 years.  Flexible sigmoidoscopy or colonoscopy.** / Every 5 years for a flexible sigmoidoscopy or every 10  years for a colonoscopy beginning at age 50 years and continuing until age 75 years.  Hepatitis C blood test.** / For all people born from 1945 through 1965 and any individual with known risks for hepatitis C.  Osteoporosis screening.** / A one-time screening for women ages 65 years and over and women at risk for fractures   or osteoporosis.  Skin self-exam. / Monthly.  Influenza vaccine. / Every year.  Tetanus, diphtheria, and acellular pertussis (Tdap/Td) vaccine.** / 1 dose of Td every 10 years.  Varicella vaccine.** / Consult your health care provider.  Zoster vaccine.** / 1 dose for adults aged 60 years or older.  Pneumococcal 13-valent conjugate (PCV13) vaccine.** / Consult your health care provider.  Pneumococcal polysaccharide (PPSV23) vaccine.** / 1 dose for all adults aged 65 years and older.  Meningococcal vaccine.** / Consult your health care provider.  Hepatitis A vaccine.** / Consult your health care provider.  Hepatitis B vaccine.** / Consult your health care provider.  Haemophilus influenzae type b (Hib) vaccine.** / Consult your health care provider. ** Family history and personal history of risk and conditions may change your health care provider's recommendations.   This information is not intended to replace advice given to you by your health care provider. Make sure you discuss any questions you have with your health care provider.   Document Released: 07/20/2001 Document Revised: 06/14/2014 Document Reviewed: 10/19/2010 Elsevier Interactive Patient Education 2016 Elsevier Inc.  

## 2015-11-12 NOTE — Progress Notes (Signed)
Subjective:    Patient ID: Carmen [name] is a 55 y.o. female presenting with No chief complaint on file.  on 11/12/2015  HPI: Here today for f/u. She is doing ok. Continues to have issue with having her grandchildren in CPS custody. Seen recently for knee pain. Which is improving. Her BS continues to be up mostly due to dietary indiscretion. Has not had a cycle in 13 months, but had one in Feb.--No bleeding since.  Review of Systems  Constitutional: Negative for fever and chills.  HENT: Negative for congestion, nosebleeds and rhinorrhea.   Eyes: Negative for visual disturbance.  Respiratory: Negative for chest tightness and shortness of breath.   Cardiovascular: Negative for chest pain.  Gastrointestinal: Negative for nausea, vomiting, abdominal pain, diarrhea, constipation, blood in stool and abdominal distention.  Genitourinary: Negative for dysuria, frequency, vaginal bleeding and menstrual problem.  Musculoskeletal: Positive for arthralgias.  Skin: Negative for rash.  Neurological: Negative for dizziness and headaches.  Psychiatric/Behavioral: Negative for behavioral problems, sleep disturbance and dysphoric mood.  All other systems reviewed and are negative.     Objective:    BP 142/76 mmHg  Pulse 80  Temp(Src) 98.2 F (36.8 C) (Oral)  Wt 226 lb (102.513 kg)  SpO2 98%  LMP 07/15/2015 Physical Exam  Constitutional: She is oriented to person, place, and time. She appears well-developed and well-nourished. No distress.  HENT:  Head: Normocephalic and atraumatic.  Eyes: Pupils are equal, round, and reactive to light. No scleral icterus.  Neck: Normal range of motion. Neck supple. No thyromegaly present.  Cardiovascular: Normal rate, regular rhythm and intact distal pulses.   Pulmonary/Chest: Effort normal and breath sounds normal. Right breast exhibits no inverted nipple, no mass and no nipple discharge. Left breast exhibits no inverted nipple, no mass and no  nipple discharge.  Abdominal: Soft. She exhibits no distension. There is no tenderness.  Genitourinary:  BUS normal, vagina is pink and rugated, cervix is multiparous without lesion, uterus is small and anteverted, no adnexal mass or tenderness.    Lymphadenopathy:       Right axillary: No lateral adenopathy present.       Left axillary: No lateral adenopathy present.      Right: No supraclavicular adenopathy present.       Left: No supraclavicular adenopathy present.  Neurological: She is alert and oriented to person, place, and time.  Skin: Skin is warm and dry.  Psychiatric: She has a normal mood and affect.        Assessment & Plan:   Problem List Items Addressed This Visit      High   HYPERTENSION, BENIGN SYSTEMIC (Chronic)    Continue atenolol and HCTZ and increase Losartan.      Relevant Medications   losartan (COZAAR) 100 MG tablet   Other Relevant Orders   Comprehensive metabolic panel (Completed)   Lipid panel (Completed)   Diabetes mellitus without complication (HCC) - Primary (Chronic)    Continue amryl, increase glucotrol to 10 mg      Relevant Medications   glipiZIDE (GLUCOTROL) 10 MG tablet   losartan (COZAAR) 100 MG tablet   Other Relevant Orders   HgB A1c (Completed)     Unprioritized   Osteoarthritis of multiple joints   Relevant Orders   VITAMIN D 25 Hydroxy (Vit-D Deficiency, Fractures) (Completed)   Left medial knee pain    improving       Other Visit Diagnoses    Screening for cervical cancer  Relevant Orders    Cytology - PAP    Routine general medical examination at a health care facility        Relevant Orders    CBC (Completed)    TSH (Completed)    Amenorrhea        No evidence of menopause on exam    Relevant Orders    Follicle stimulating hormone (Completed)       Total face-to-face time with patient: 25 minutes. Over 50% of encounter was spent on counseling and coordination of care. Return in about 3 months (around  02/12/2016) for a follow-up.  Adelayde Minney S 11/12/2015 3:34 PM

## 2015-11-13 ENCOUNTER — Encounter: Payer: Self-pay | Admitting: Family Medicine

## 2015-11-13 LAB — CYTOLOGY - PAP

## 2015-11-13 LAB — VITAMIN D 25 HYDROXY (VIT D DEFICIENCY, FRACTURES): VIT D 25 HYDROXY: 36 ng/mL (ref 30–100)

## 2015-11-13 LAB — FOLLICLE STIMULATING HORMONE: FSH: 39 m[IU]/mL

## 2015-11-13 NOTE — Assessment & Plan Note (Signed)
Continue atenolol and HCTZ and increase Losartan.

## 2015-11-13 NOTE — Assessment & Plan Note (Signed)
Continue amryl, increase glucotrol to 10 mg

## 2015-11-13 NOTE — Assessment & Plan Note (Signed)
improving

## 2015-11-27 ENCOUNTER — Encounter: Payer: Self-pay | Admitting: Family Medicine

## 2015-11-27 ENCOUNTER — Ambulatory Visit (INDEPENDENT_AMBULATORY_CARE_PROVIDER_SITE_OTHER): Payer: Self-pay | Admitting: Family Medicine

## 2015-11-27 VITALS — BP 139/80 | HR 66 | Temp 98.4°F | Wt 226.0 lb

## 2015-11-27 DIAGNOSIS — K649 Unspecified hemorrhoids: Secondary | ICD-10-CM | POA: Insufficient documentation

## 2015-11-27 MED ORDER — LIDOCAINE-HYDROCORTISONE ACE 3-0.5 % RE CREA: 1.0000 | TOPICAL_CREAM | Freq: Two times a day (BID) | RECTAL | Status: DC

## 2015-11-27 NOTE — Assessment & Plan Note (Signed)
Small external thrombosed hemorrhoid that is improving with conservative management.  - Recommended sitz bath 3 times a day - Recommended topical lidocaine/hydrocortisone; prescription provided - Advised daily fiber supplement - Follow-up in 5-7 days if not improving or sooner if develops worsening pain or bleeding.  Would recommend hemorrhoidectomy at that

## 2015-11-27 NOTE — Progress Notes (Signed)
   Subjective:    Patient ID: Carmen ComesFrances B Louderback, female    DOB: 10/28/1960, 55 y.o.   MRN: [id number]  Seen for Same day visit for   CC: rectal pain  She reports rectal pain for the past 3-4 days.  Pain is worse with sitting.  Additionally, reports minimal frank red blood with wiping.  Has history of hemorrhoids treated with topical creams.  She has been using OTC "hemorrhoid" cream and warm baths with some improvement in the pain.  Reports she always has exacerbation of hemorrhoids during periods of stress.  Denies any fever, chills, abdominal pain.  She reports pain has been improving in the past 2  3 days.   Social: smoking history noted  Review of Systems   See HPI for ROS. Objective:  BP 139/80 mmHg  Pulse 66  Temp(Src) 98.4 F (36.9 C) (Oral)  Wt 226 lb (102.513 kg)  SpO2 97%  LMP 07/15/2015  General: NAD Rectal: Small, mldly tender 0.5 cm thrombosed hemorrhoid without bleeding  Assessment & Plan:   Hemorrhoids Small external thrombosed hemorrhoid that is improving with conservative management.  - Recommended sitz bath 3 times a day - Recommended topical lidocaine/hydrocortisone; prescription provided - Advised daily fiber supplement - Follow-up in 5-7 days if not improving or sooner if develops worsening pain or bleeding.  Would recommend hemorrhoidectomy at that

## 2015-11-27 NOTE — Patient Instructions (Addendum)
You have a thrombosed hemorrhoid.  - Continue sitz baths 3-4 times daily - Apply topical steroid and lidocaine cream twice a day for the next 7 days - Call if you develop worsening pain or bleeding and I will refer you to surgery for hemorrhoidectomy  Hemorrhoids Hemorrhoids are puffy (swollen) veins around the rectum or anus. Hemorrhoids can cause pain, itching, bleeding, or irritation. HOME CARE  Eat foods with fiber, such as whole grains, beans, nuts, fruits, and vegetables. Ask your doctor about taking products with added fiber in them (fibersupplements).  Drink enough fluid to keep your pee (urine) clear or pale yellow.  Exercise often.  Go to the bathroom when you have the urge to poop. Do not wait.  Avoid straining to poop (bowel movement).  Keep the butt area dry and clean. Use wet toilet paper or moist paper towels.  Medicated creams and medicine inserted into the anus (anal suppository) may be used or applied as told.  Only take medicine as told by your doctor.  Take a warm water bath (sitz bath) for 15-20 minutes to ease pain. Do this 3-4 times a day.  Place ice packs on the area if it is tender or puffy. Use the ice packs between the warm water baths.  Put ice in a plastic bag.  Place a towel between your skin and the bag.  Leave the ice on for 15-20 minutes, 03-04 times a day.  Do not use a donut-shaped pillow or sit on the toilet for a long time. GET HELP RIGHT AWAY IF:   You have more pain that is not controlled by treatment or medicine.  You have bleeding that will not stop.  You have trouble or are unable to poop (bowel movement).  You have pain or puffiness outside the area of the hemorrhoids. MAKE SURE YOU:   Understand these instructions.  Will watch your condition.  Will get help right away if you are not doing well or get worse.   This information is not intended to replace advice given to you by your health care provider. Make sure you  discuss any questions you have with your health care provider.   Document Released: 03/02/2008 Document Revised: 05/10/2012 Document Reviewed: 04/04/2012 Elsevier Interactive Patient Education [employer]2016 Elsevier Inc.

## 2015-11-28 ENCOUNTER — Telehealth: Payer: Self-pay | Admitting: Family Medicine

## 2015-11-28 DIAGNOSIS — E119 Type 2 diabetes mellitus without complications: Secondary | ICD-10-CM

## 2015-11-28 MED ORDER — GLIPIZIDE 10 MG PO TABS: ORAL_TABLET | ORAL | Status: DC

## 2015-11-28 NOTE — Telephone Encounter (Signed)
Tried [employer] [facility] with no answer.  Resent medication electronically. Garth Diffley,CMA

## 2015-11-28 NOTE — Telephone Encounter (Signed)
Pt called and said that [facility] told her that she didn't have any refills on her Glipizide. This was sent in on 11/12/15 with 3 refills. Can we call and straighten this out. jw

## 2015-12-02 ENCOUNTER — Other Ambulatory Visit: Payer: Self-pay | Admitting: *Deleted

## 2015-12-02 NOTE — Telephone Encounter (Signed)
Opened in error. Fleeger, Jessica Dawn, CMA  

## 2016-02-27 ENCOUNTER — Other Ambulatory Visit: Payer: Self-pay | Admitting: Family Medicine

## 2016-02-27 DIAGNOSIS — I10 Essential (primary) hypertension: Secondary | ICD-10-CM

## 2016-03-08 ENCOUNTER — Ambulatory Visit: Payer: Self-pay | Attending: Internal Medicine

## 2016-03-22 ENCOUNTER — Other Ambulatory Visit: Payer: Self-pay | Admitting: Family Medicine

## 2016-03-23 ENCOUNTER — Other Ambulatory Visit: Payer: Self-pay | Admitting: Family Medicine

## 2016-03-23 DIAGNOSIS — I10 Essential (primary) hypertension: Secondary | ICD-10-CM

## 2016-04-26 ENCOUNTER — Telehealth: Payer: Self-pay | Admitting: *Deleted

## 2016-04-26 NOTE — Telephone Encounter (Signed)
Patient states that she just started a new job at [employer] in [place].  States that she needs a doctors note saying she needs to keep water at her location due to her health conditions.  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  Patient states that she needs this done ASAP.  Patient would like this mailed to her if possible or she can come get it if absolutely necessary.  Offered to fax it to patient, but she would rather hand it to them in person.  Will forward to Md to advise. Mahogony Gilchrest,CMA

## 2016-04-27 NOTE — Telephone Encounter (Signed)
Letter printed and placed in mail for patient.  She is aware of this. Karn Derk,CMA

## 2016-05-01 ENCOUNTER — Other Ambulatory Visit: Payer: Self-pay | Admitting: Family Medicine

## 2016-05-01 DIAGNOSIS — I10 Essential (primary) hypertension: Secondary | ICD-10-CM

## 2016-05-17 ENCOUNTER — Ambulatory Visit: Payer: Self-pay | Admitting: Family Medicine

## 2016-05-19 ENCOUNTER — Encounter: Payer: Self-pay | Admitting: Family Medicine

## 2016-05-19 ENCOUNTER — Ambulatory Visit (INDEPENDENT_AMBULATORY_CARE_PROVIDER_SITE_OTHER): Payer: Self-pay | Admitting: Family Medicine

## 2016-05-19 VITALS — BP 134/72 | HR 68 | Temp 98.3°F | Wt 217.0 lb

## 2016-05-19 DIAGNOSIS — L0292 Furuncle, unspecified: Secondary | ICD-10-CM

## 2016-05-19 MED ORDER — DOXYCYCLINE HYCLATE 100 MG PO TABS: 100.0000 mg | ORAL_TABLET | Freq: Two times a day (BID) | ORAL | 0 refills | Status: AC

## 2016-05-19 NOTE — Progress Notes (Signed)
Subjective:    Patient ID: Carmen [name], female    DOB: Oct 19, 1960, 55 y.o.   MRN: [id number]   CC: Boil on her back  HPI: Carmen [name] is a 55 yo female with a past medical history significant for type 2 DM, hypertension, GERD and osteoarthritis who presents today with boil on her back.  Back abscess: Patient reports that she started to have an itchy feeling on her back about a week ago. On Saturday, patient felt a boil on her back which was confirmed by her mother. Patient states that she applied some bacitracin on it and put some warm compresses as well. Sunday abscess started to drain and she noticed redness. Abscess continue to drain on Monday with increase redness. Patient has similar developing abscess at the bottom of her left buttock on the inferior aspect. Given her diabetes, patient was concerning she had cellulitis and decided to come to clinic. Patient denies any fever, chills, abdominal pain, nausea or vomiting with symptoms.  Smoking status reviewed   ROS: all other systems were reviewed and are negative other than in the HPI   Past medical history, surgical, family, and social history reviewed and updated in the EMR as appropriate.  Objective:  BP 134/72   Pulse 68   Temp 98.3 F (36.8 C) (Oral)   Wt 217 lb (98.4 kg)   LMP 07/15/2015 (Exact Date)   SpO2 97%   BMI 38.44 kg/m       Vitals and nursing note reviewed  General: NAD, pleasant, able to participate in exam Cardiac: RRR, normal heart sounds, no murmurs. 2+ radial and PT pulses bilaterally Respiratory: CTAB, normal effort, No wheezes, rales or rhonchi Abdomen: soft, nontender, nondistended, no hepatic or splenomegaly, +BS Extremities: no edema or cyanosis. WWP. Skin: warm and dry, left upper back purulent abscess noted on exam. 2 x 2 cm surrounding erythema, minimal fluctuance, larger surrounding indurated area Neuro: alert and oriented x4, no focal deficits Psych: Normal affect and  mood   Assessment & Plan:    # Left sided upper back purulent abscess with surrounding erythema Patient with draining purulent abscess without any systemic signs with surrounding erythema consistent with cellulitis. Patient is a diabetic with poor control (last A1c-9.8). Given purulent drainage, and type 2 DM will treat more aggressively to minimize risk of larger infection. Abscess not large enough for I&D, however will express as much pus as possible from abscess while in clinic.  --Continue warm compresses for drainage --Will start patient on doxycycline 100 mg po BID for 7 days --Will continue to monitor for sign of worsening infection --Please follow up with PCP.    Lovena Neighbours, MD Family Medicine Resident PGY-1

## 2016-05-19 NOTE — Patient Instructions (Addendum)
It was great seeing you today! We have addressed the following issues today  1. Please take doxycycline for 7 days two times a day. Continue warm compresses at home to help drainage. 2. If it does not improve please return to clinic for further assessment   If we did any lab work today, and the results require attention, either me or my nurse will get in touch with you. If everything is normal, you will get a letter in mail. If you don't hear from us in two weeks, please give us a call. Otherwise, we look forward to seeing you again at your next visit. If you have any questions or concerns before then, please call the clinic at [phone].   Please bring all your medications to every doctors visit   Sign up for My Chart to have easy access to your labs results, and communication with your Primary care physician.     Please check-out at the front desk before leaving the clinic.    Take Care,   Skin Abscess A skin abscess is an infected area on or under your skin that contains pus and other material. An abscess can happen almost anywhere on your body. Some abscesses break open (rupture) on their own. Most continue to get worse unless they are treated. The infection can spread deeper into the body and into your blood, which can make you feel sick. Treatment usually involves draining the abscess. Follow these instructions at home: Abscess Care  If you have an abscess that has not drained, place a warm, clean, wet washcloth over the abscess several times a day. Do this as told by your doctor.  Follow instructions from your doctor about how to take care of your abscess. Make sure you:  Cover the abscess with a bandage (dressing).  Change your bandage or gauze as told by your doctor.  Wash your hands with soap and water before you change the bandage or gauze. If you cannot use soap and water, use hand sanitizer.  Check your abscess every day for signs that the infection is getting  worse. Check for:  More redness, swelling, or pain.  More fluid or blood.  Warmth.  More pus or a bad smell. Medicines  Take over-the-counter and prescription medicines only as told by your doctor.  If you were prescribed an antibiotic medicine, take it as told by your doctor. Do not stop taking the antibiotic even if you start to feel better. General instructions  To avoid spreading the infection:  Do not share personal care items, towels, or hot tubs with others.  Avoid making skin-to-skin contact with other people.  Keep all follow-up visits as told by your doctor. This is important. Contact a doctor if:  You have more redness, swelling, or pain around your abscess.  You have more fluid or blood coming from your abscess.  Your abscess feels warm when you touch it.  You have more pus or a bad smell coming from your abscess.  You have a fever.  Your muscles ache.  You have chills.  You feel sick. Get help right away if:  You have very bad (severe) pain.  You see red streaks on your skin spreading away from the abscess. This information is not intended to replace advice given to you by your health care provider. Make sure you discuss any questions you have with your health care provider. Document Released: 11/10/2007 Document Revised: 01/18/2016 Document Reviewed: 04/02/2015 Elsevier Interactive Patient Education  2017  [employer].

## 2016-06-01 ENCOUNTER — Other Ambulatory Visit: Payer: Self-pay | Admitting: Family Medicine

## 2016-06-01 DIAGNOSIS — I10 Essential (primary) hypertension: Secondary | ICD-10-CM

## 2016-06-10 ENCOUNTER — Encounter: Payer: Self-pay | Admitting: Family Medicine

## 2016-06-10 ENCOUNTER — Ambulatory Visit (INDEPENDENT_AMBULATORY_CARE_PROVIDER_SITE_OTHER): Payer: Self-pay | Admitting: Family Medicine

## 2016-06-10 VITALS — BP 140/90 | HR 71 | Temp 98.6°F | Ht 62.0 in | Wt 223.4 lb

## 2016-06-10 DIAGNOSIS — M7501 Adhesive capsulitis of right shoulder: Secondary | ICD-10-CM

## 2016-06-10 DIAGNOSIS — M7502 Adhesive capsulitis of left shoulder: Secondary | ICD-10-CM

## 2016-06-10 DIAGNOSIS — E119 Type 2 diabetes mellitus without complications: Secondary | ICD-10-CM

## 2016-06-10 DIAGNOSIS — I10 Essential (primary) hypertension: Secondary | ICD-10-CM

## 2016-06-10 LAB — POCT GLYCOSYLATED HEMOGLOBIN (HGB A1C): Hemoglobin A1C: 9.4

## 2016-06-10 NOTE — Assessment & Plan Note (Signed)
Struggling to do all functions at her job due to this limited mobility--continue exercises.

## 2016-06-10 NOTE — Assessment & Plan Note (Addendum)
Cannot afford many meds--will refer to Dr. Raymondo BandKoval to see if he has any other ideas of meds to help with glycemic control. Best control with Metformin-->she does not take due to GI side-effects.

## 2016-06-10 NOTE — Assessment & Plan Note (Signed)
BP is stable for now.

## 2016-06-10 NOTE — Progress Notes (Signed)
   Subjective:    Patient ID: Carmen [name] is a 56 y.o. female presenting with No chief complaint on file.  on 06/10/2016  HPI: Boil on back is smaller and still has a head on it. Took all of her antibiotics. Her HgbA1C is high--she has no source of food other than straight carbs. She lives with her mother and has no income. She cannot afford many of her medications. She has failed metformin due to side effects.  Review of Systems  Constitutional: Negative for chills and fever.  Respiratory: Negative for shortness of breath.   Cardiovascular: Negative for chest pain.  Gastrointestinal: Negative for abdominal pain, nausea and vomiting.  Genitourinary: Negative for dysuria.  Skin: Negative for rash.      Objective:    BP 140/90   Pulse 71   Temp 98.6 F (37 C) (Oral)   Ht 5\' 2"  (1.575 m)   Wt 223 lb 6.4 oz (101.3 kg)   LMP 07/15/2015 (Exact Date)   SpO2 98%   BMI 40.86 kg/m  Physical Exam  Constitutional: She is oriented to person, place, and time. She appears well-developed and well-nourished. No distress.  HENT:  Head: Normocephalic and atraumatic.  Eyes: No scleral icterus.  Neck: Neck supple.  Cardiovascular: Normal rate.   Pulmonary/Chest: Effort normal.  Abdominal: Soft.  Neurological: She is alert and oriented to person, place, and time.  Skin: Skin is warm and dry.  Area of back looked at---There remains some mild induration, no erythema, is not draining  Psychiatric: She has a normal mood and affect.        Assessment & Plan:   Problem List Items Addressed This Visit      High   HYPERTENSION, BENIGN SYSTEMIC (Chronic)    BP is stable for now.      Diabetes mellitus without complication (HCC) - Primary (Chronic)    Cannot afford many meds--will refer to Dr. Raymondo BandKoval to see if he has any other ideas of meds to help with glycemic control. Best control with Metformin-->she does not take due to GI side-effects.      Relevant Orders   HgB A1c  (Completed)     Unprioritized   Adhesive capsulitis of shoulder    Struggling to do all functions at her job due to this limited mobility--continue exercises.       Boil seems to be resolving  Total face-to-face time with patient: 25 minutes. Over 50% of encounter was spent on counseling and coordination of care. Return in about 6 months (around 12/08/2016) for a follow-up.  Reva Boresanya S Elisia Stepp 06/10/2016 11:04 AM

## 2016-06-10 NOTE — Patient Instructions (Signed)
Carbohydrate Counting for Diabetes Mellitus, Adult Carbohydrate counting is a method for keeping track of how many carbohydrates you eat. Eating carbohydrates naturally increases the amount of sugar (glucose) in the blood. Counting how many carbohydrates you eat helps keep your blood glucose within normal limits, which helps you manage your diabetes (diabetes mellitus). It is important to know how many carbohydrates you can safely have in each meal. This is different for every person. A diet and nutrition specialist (registered dietitian) can help you make a meal plan and calculate how many carbohydrates you should have at each meal and snack. Carbohydrates are found in the following foods:  Grains, such as breads and cereals.  Dried beans and soy products.  Starchy vegetables, such as potatoes, peas, and corn.  Fruit and fruit juices.  Milk and yogurt.  Sweets and snack foods, such as cake, cookies, candy, chips, and soft drinks. How do I count carbohydrates? There are two ways to count carbohydrates in food. You can use either of the methods or a combination of both. Reading "Nutrition Facts" on packaged food  The "Nutrition Facts" list is included on the labels of almost all packaged foods and beverages in the [place] It includes:  The serving size.  Information about nutrients in each serving, including the grams (g) of carbohydrate per serving. To use the "Nutrition Facts":  Decide how many servings you will have.  Multiply the number of servings by the number of carbohydrates per serving.  The resulting number is the total amount of carbohydrates that you will be having. Learning standard serving sizes of other foods  When you eat foods containing carbohydrates that are not packaged or do not include "Nutrition Facts" on the label, you need to measure the servings in order to count the amount of carbohydrates:  Measure the foods that you will eat with a food scale or measuring  cup, if needed.  Decide how many standard-size servings you will eat.  Multiply the number of servings by 15. Most carbohydrate-rich foods have about 15 g of carbohydrates per serving.  For example, if you eat 8 oz (170 g) of strawberries, you will have eaten 2 servings and 30 g of carbohydrates (2 servings x 15 g = 30 g).  For foods that have more than one food mixed, such as soups and casseroles, you must count the carbohydrates in each food that is included. The following list contains standard serving sizes of common carbohydrate-rich foods. Each of these servings has about 15 g of carbohydrates:   hamburger bun or  English muffin.   oz (15 mL) syrup.   oz (14 g) jelly.  1 slice of bread.  1 six-inch tortilla.  3 oz (85 g) cooked rice or pasta.  4 oz (113 g) cooked dried beans.  4 oz (113 g) starchy vegetable, such as peas, corn, or potatoes.  4 oz (113 g) hot cereal.  4 oz (113 g) mashed potatoes or  of a large baked potato.  4 oz (113 g) canned or frozen fruit.  4 oz (120 mL) fruit juice.  4-6 crackers.  6 chicken nuggets.  6 oz (170 g) unsweetened dry cereal.  6 oz (170 g) plain fat-free yogurt or yogurt sweetened with artificial sweeteners.  8 oz (240 mL) milk.  8 oz (170 g) fresh fruit or one small piece of fruit.  24 oz (680 g) popped popcorn. Example of carbohydrate counting Sample meal  3 oz (85 g) chicken breast.  6 oz (  170 g) brown rice.  4 oz (113 g) corn.  8 oz (240 mL) milk.  8 oz (170 g) strawberries with sugar-free whipped topping. Carbohydrate calculation 1. Identify the foods that contain carbohydrates:  Rice.  Corn.  Milk.  Strawberries. 2. Calculate how many servings you have of each food:  2 servings rice.  1 serving corn.  1 serving milk.  1 serving strawberries. 3. Multiply each number of servings by 15 g:  2 servings rice x 15 g = 30 g.  1 serving corn x 15 g = 15 g.  1 serving milk x 15 g = 15  g.  1 serving strawberries x 15 g = 15 g. 4. Add together all of the amounts to find the total grams of carbohydrates eaten:  30 g + 15 g + 15 g + 15 g = 75 g of carbohydrates total. This information is not intended to replace advice given to you by your health care provider. Make sure you discuss any questions you have with your health care provider. Document Released: 05/24/2005 Document Revised: 12/12/2015 Document Reviewed: 11/05/2015 Elsevier Interactive Patient Education  2017 Elsevier Inc.  

## 2016-06-30 ENCOUNTER — Other Ambulatory Visit: Payer: Self-pay | Admitting: Family Medicine

## 2016-06-30 DIAGNOSIS — E119 Type 2 diabetes mellitus without complications: Secondary | ICD-10-CM

## 2016-07-01 ENCOUNTER — Ambulatory Visit (INDEPENDENT_AMBULATORY_CARE_PROVIDER_SITE_OTHER): Payer: Self-pay | Admitting: Pharmacist

## 2016-07-01 ENCOUNTER — Encounter: Payer: Self-pay | Admitting: Pharmacist

## 2016-07-01 DIAGNOSIS — E119 Type 2 diabetes mellitus without complications: Secondary | ICD-10-CM

## 2016-07-01 MED ORDER — METFORMIN HCL ER 500 MG PO TB24: ORAL_TABLET | ORAL | 2 refills | Status: DC

## 2016-07-01 MED ORDER — GLIPIZIDE 10 MG PO TABS: 10.0000 mg | ORAL_TABLET | Freq: Two times a day (BID) | ORAL | 2 refills | Status: DC

## 2016-07-01 MED ORDER — GLIMEPIRIDE 4 MG PO TABS
4.0000 mg | ORAL_TABLET | Freq: Every day | ORAL | 10 refills | Status: DC
Start: 2016-07-01 — End: 2016-07-29

## 2016-07-01 NOTE — Assessment & Plan Note (Signed)
Diabetes longstanding diagnosed currently uncontrolled. Patient denies hypoglycemic events and is able to verbalize appropriate hypoglycemia management plan. Patient reports adherence with medication. Control is suboptimal due to nonadherence and insulin resistance. Discussed risks of uncontrolled diabetes and discussed starting insulin but patient has refused insulin or any injectable medication.  Patient has no insurance and refuses to go to social services to apply for medicaid.  She reports diarrhea with metformin in the past but is open to trying a low dose of extended release.  Also have discussed risks/benefits of pioglitazone including lower extremity edema and risk of bladder cancer.  Will start Metformin XR 500 mg daily x 1 week and increase to 500 mg BID as tolerated.  Have stopped Glipizide as patient is also taking glimepiride. Patient will finish glipizide then switch to glimepiride.  Instructed patient to start checking BG daily.  Will consider Actos at next visit or if patient unable to tolerate metformin. Have asked her to call clinic if she is unable to tolerate.  Next A1C anticipated 3 months.

## 2016-07-01 NOTE — Telephone Encounter (Signed)
Fax request received for this medication.  Refilled ok per standing order from provider. Jazmin Hartsell,CMA

## 2016-07-01 NOTE — Progress Notes (Signed)
S:    Patient arrives in good spirits ambulating without assistance.  Presents for diabetes evaluation, education, and management at the request of Dr Shawnie PonsPratt. Patient was referred on 06/10/16.  Patient was last seen by Primary Care Provider on 06/10/16.    Patient reports adherence with medications with the exception of Glipizide which she commonly forgets to take in the evening.  Current diabetes medications include: Glimepiride 4 mg once daily, Glipizide 10 mg BID. (taking both currently) Current hypertension medications include: Atenolol 50 mg, HCTZ 25 mg once daily, Losartan 100 mg once daily  Patient denies hypoglycemic events. Patient states that when she goes get low she drinks grapefruit juice or eats a peanut butter sandwich.   Patient reported dietary habits: Eats 3 meals/day. Reports that her mother buys a lot of junk food. Breakfast: Cereal, toast Lunch: Fried chicken with thousand [employer] dressing, western sandwich (egg, deli meat, ketchup) Dinner: Frozen pizza with salad, pork chop with baked potato and green peas. Snacks: Peanut butter crackers, donut Drinks: Nestle lemon water, drinks soda on occasion   Patient reported exercise habits: Walks some at work. Denies any other exercise.   Patient reports nocturia. Some nights she gets up every 1.5-2 hours to urinate. Normally, it's about 3 times per night Patient denies neuropathy. Patient denies visual changes. Denies having an eye exam this year. Patient reports self foot exams. Reports that PCP checks her feet.  Patient started working part-time at [employer] in November. She does report a lot of stress with family in her life.    Patient used to take Metformin but stopped taking it because it caused significant diarrhea. She has previously taken metformin 1000 mg BID and metformin XR 750 mg 2 tablets daily.   Patient checks her BG about 1-2 times weekly. 1/17: 207 in the morning 1/15: 266  No lower extremity edema on exam  today.    O:  Lab Results  Component Value Date   HGBA1C 9.4 06/10/2016   Vitals:   07/01/16 1059  BP: 124/78    A/P: Diabetes longstanding diagnosed currently uncontrolled. Patient denies hypoglycemic events and is able to verbalize appropriate hypoglycemia management plan. Patient reports adherence with medication. Control is suboptimal due to nonadherence and insulin resistance. Discussed risks of uncontrolled diabetes and discussed starting insulin but patient has refused insulin or any injectable medication.  Patient has no insurance and refuses to go to social services to apply for medicaid.  She reports diarrhea with metformin in the past but is open to trying a low dose of extended release.  Also have discussed risks/benefits of pioglitazone including lower extremity edema and risk of bladder cancer.  Will start Metformin XR 500 mg daily x 1 week and increase to 500 mg BID as tolerated.  Have stopped Glipizide as patient is also taking glimepiride. Patient will finish glipizide then switch to glimepiride.  Instructed patient to start checking BG daily.  Will consider Actos at next visit or if patient unable to tolerate metformin. Have asked her to call clinic if she is unable to tolerate.  Next A1C anticipated 3 months.    ASCVD risk greater than 7.5%. Restarted Aspirin 81 mg and will consider addition of statin at next visit.   Hypertension longstanding diagnosed currently at goal today.  Patient reports adherence with medication.  Written patient instructions provided.  Total time in face to face counseling 60 minutes.   Follow up in Pharmacist Clinic Visit in 4 weeks.   Patient seen  with Hazle Nordmann, PharmD BCPS and Crista Curb, PharmD Candidate.

## 2016-07-01 NOTE — Patient Instructions (Addendum)
Start Metformin XR 500 mg once daily for 1 week with food.  If you are able to tolerate then you may increase to 500 mg twice daily.    If you cannot tolerate the metformin please call the clinic and we may trial Actos (pioglitazone).    Continue Glipizide 10 mg twice daily.  Stop Glimepiride until you finish the glipizide 10 mg twice daily.  Then you can restart Glimepiride 4 mg once daily.   These medications are very similar and you should only continue one of them.    Restart aspirin 81 mg once daily  Please start checking your blood glucose once daily.    Followup in 4 weeks with Dr Raymondo BandKoval.

## 2016-07-02 NOTE — Progress Notes (Signed)
Patient ID: Carmen [name], female   DOB: 05/04/1961, 56 y.o.   MRN: [id number] Reviewed: I agree with Dr. Macky LowerKoval's documentation and management.

## 2016-07-07 ENCOUNTER — Other Ambulatory Visit: Payer: Self-pay | Admitting: Family Medicine

## 2016-07-07 DIAGNOSIS — I10 Essential (primary) hypertension: Secondary | ICD-10-CM

## 2016-07-20 ENCOUNTER — Ambulatory Visit (INDEPENDENT_AMBULATORY_CARE_PROVIDER_SITE_OTHER): Payer: Self-pay | Admitting: Student

## 2016-07-20 ENCOUNTER — Encounter: Payer: Self-pay | Admitting: Student

## 2016-07-20 VITALS — BP 120/80 | HR 69 | Temp 98.2°F | Wt 214.0 lb

## 2016-07-20 DIAGNOSIS — R200 Anesthesia of skin: Secondary | ICD-10-CM

## 2016-07-20 DIAGNOSIS — F419 Anxiety disorder, unspecified: Secondary | ICD-10-CM | POA: Insufficient documentation

## 2016-07-20 DIAGNOSIS — R202 Paresthesia of skin: Secondary | ICD-10-CM

## 2016-07-20 DIAGNOSIS — K582 Mixed irritable bowel syndrome: Secondary | ICD-10-CM

## 2016-07-20 NOTE — Progress Notes (Signed)
   Subjective:    Patient ID: Carmen ComesFrances B [name], female    DOB: 10/31/1960, 56 y.o.   MRN: [id number]   CC: bowel issues, tingling of face, back, left side  HPI: 56 y/o F with bowel issues, tingling of face, back, left side  Bowel issues - has a long history of intermittent diarrhea with constipation when she is stressed - she feels she has been stressed financially due to not getting the expected amount of hours at her new job at [employer] - she is considering starting probiotics  Body tingling - she has had tingling of the left side of her face, left arm, entire back and abdomen - she reports similar tingling she has has had episodes of anxiety - denies weakness, shortness or breath, chest pain, headache  Anxiety - previously treated with effexor and therapy - she didi not like her therapist so she stopped that and she stopped her effexor as she feels it " did not work" - denies low mood or SI/HI  Smoking status reviewed  Review of Systems  Per HPI   Objective:  BP 120/80   Pulse 69   Temp 98.2 F (36.8 C) (Oral)   Wt 214 lb (97.1 kg)   SpO2 98%   BMI 39.14 kg/m  Vitals and nursing note reviewed  General: NAD Cardiac: RRR Respiratory: CTAB, normal effort. Skin: warm and dry, no rashes noted Neuro:   Cranial Nerves II - XII - II - Visual field intact  III, IV, VI - Extraocular movements intact. V - Facial sensation intact bilaterally. VII - Facial movement intact bilaterally. VIII - Hearing & vestibular intact bilaterally. X - Palate elevates symmetrically, no dysarthria. XI - Chin turning & shoulder shrug intact bilaterally. XII - Tongue protrusion intact.  Motor Strength - The patient's strength was 5/5 in all extremities and pronator drift was absent.   Reflexes - The patient's reflexes were 1+ in bilateral LE  Sensory - Light touch was  assessed and was symmetrical.    Assessment & Plan:    Numbness and tingling in left arm Continued numbness  and tingling in the setting of history of normal head CT and stress test. Likely related to stress - encouraged behavioral health which she will consider  - she declines pharmacologic therapy  Anxiety Current symptoms of tingling appear to be related to life stressors - encouraged behavioral health which she will consider  - she declines pharmacologic therapy  Irritable bowel syndrome Continues to have IBS type symptoms - wishes to limit therapy to probiotics at this time - will follow    Jalexis Breed A. Kennon RoundsHaney MD, MS Family Medicine Resident PGY-3 Pager [phone]

## 2016-07-20 NOTE — Assessment & Plan Note (Signed)
Continued numbness and tingling in the setting of history of normal head CT and stress test. Likely related to stress - encouraged behavioral health which she will consider  - she declines pharmacologic therapy

## 2016-07-20 NOTE — Assessment & Plan Note (Signed)
Continues to have IBS type symptoms - wishes to limit therapy to probiotics at this time - will follow

## 2016-07-20 NOTE — Patient Instructions (Signed)
Follow up in 1 month with Dr Kallie EdwardPratt You can try Probiotics for your bowels Please consider making an appointment with behavioral health

## 2016-07-20 NOTE — Assessment & Plan Note (Signed)
Current symptoms of tingling appear to be related to life stressors - encouraged behavioral health which she will consider  - she declines pharmacologic therapy

## 2016-07-24 ENCOUNTER — Ambulatory Visit (HOSPITAL_COMMUNITY)
Admission: EM | Admit: 2016-07-24 | Discharge: 2016-07-24 | Disposition: A | Discharge status: Home / Self Care | Payer: Self-pay | Attending: Family Medicine | Admitting: Family Medicine

## 2016-07-24 ENCOUNTER — Encounter (HOSPITAL_COMMUNITY): Payer: Self-pay | Admitting: Emergency Medicine

## 2016-07-24 DIAGNOSIS — R42 Dizziness and giddiness: Secondary | ICD-10-CM

## 2016-07-24 DIAGNOSIS — F419 Anxiety disorder, unspecified: Secondary | ICD-10-CM

## 2016-07-24 NOTE — ED Provider Notes (Signed)
CSN: [id number]     Arrival date & time 07/24/16  1200 History   None    Chief Complaint  Patient presents with  . Dizziness   (Consider location/radiation/quality/duration/timing/severity/associated sxs/prior Treatment) Patient is a 56 y.o. Female, with hx of Anxiety, T2DM, HTN, COPD, and Borderline Personality disorder, started 6 days ago with a "pricking" sensation mainly on her left side of body (left face, neck, breast, arm, and sometimes the leg) but would occasionally experience this pricking sensation on the right side of body as well. She saw family medicine Dr. Kennon RoundsHaney 02/13 for this complaint and was believed to be due to her uncontrolled anxiety. She is working on getting in with a new therapist for her anxiety. She is not currently taking any medicine for her anxiety. Patient reports this "pricking" sensation has not resolved. In addition, it makes her even more worry this morning because she had a dizzy spell and feels loopy. She states the dizziness is intermittent without any alleviating or aggravating factors. She denies CP, SOB, extremity pain, diaphoresis, syncope, body weakness, confusion, or  Headache. She does endorses some tinnitus occasionally. 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        Past Medical History:  Diagnosis Date  . Adhesive capsulitis of shoulder   . Allergy   . Borderline personality disorder   . Carpal tunnel syndrome   . COPD (chronic obstructive pulmonary disease) (HCC)   . Depression   . GERD (gastroesophageal reflux disease)   . Hypertension    Past Surgical History:  Procedure Laterality Date  . CHOLECYSTECTOMY    . TONSILLECTOMY     Family History  Problem Relation Age of Onset  . Diabetes Mother   . Hypertension Mother    Social History  Substance Use Topics  . Smoking status: Former Smoker    Packs/day: 0.25   Years: 30.00    Types: Cigarettes    Quit date: 08/13/2007  . Smokeless tobacco: Never Used     Comment: boyfriend smokes  . Alcohol use No     Comment: rarely   OB History    No data available     Review of Systems  Constitutional:       As stated in the HPI    Allergies  Ace inhibitors and Metformin and related  Home Medications   Prior to Admission medications   Medication Sig Start Date End Date Taking? Authorizing Provider  atenolol (TENORMIN) 50 MG tablet TAKE ONE TABLET BY MOUTH ONCE DAILY 05/03/16   Reva Boresanya S Pratt, MD  glimepiride (AMARYL) 4 MG tablet Take 1 tablet (4 mg total) by mouth daily before breakfast. Patient to finish supply of Glipizide then only take Glimepiride. 07/01/16   Moses MannersWilliam A Hensel, MD  glipiZIDE (GLUCOTROL) 10 MG tablet Take 1 tablet (10 mg total) by mouth 2 (two) times daily. Patient to finish supply of Glipizide then only take Glimepiride. 07/01/16   Moses MannersWilliam A Hensel, MD  hydrochlorothiazide (HYDRODIURIL) 25 MG tablet TAKE ONE TABLET BY MOUTH ONCE DAILY 07/07/16   Reva Boresanya S Pratt, MD  ibuprofen (ADVIL,MOTRIN) 200 MG tablet Take 200 mg by mouth every 4 (four) hours as needed. Take with food     Historical Provider, MD  losartan (COZAAR) 50 MG tablet TAKE ONE TABLET BY MOUTH ONCE DAILY 07/07/16   Reva Boresanya S Pratt, MD  metFORMIN (GLUCOPHAGE XR) 500  MG 24 hr tablet Take 500 mg once daily with food for 1 week then increase to 500 mg twice daily with food as tolerated 07/01/16   Moses Manners, MD   Meds Ordered and Administered this Visit  Medications - No data to display  BP 152/94   Pulse 63   Temp 98.1 F (36.7 C) (Oral)   Resp 16   Ht 5\' 2"  (1.575 m)   Wt 214 lb (97.1 kg)   SpO2 98%   BMI 39.14 kg/m  Orthostatic VS for the past 24 hrs:  BP- Lying Pulse- Lying BP- Sitting Pulse- Sitting BP- Standing at 0 minutes Pulse- Standing at 0 minutes  07/24/16 1306 107/76 54 (!) 150/93 63 (!) 147/99 62    Physical Exam  Constitutional: She is oriented to  person, place, and time. She appears well-developed and well-nourished.  HENT:  Head: Normocephalic and atraumatic.  Right Ear: External ear normal.  Left Ear: External ear normal.  Eyes: Conjunctivae are normal. Pupils are equal, round, and reactive to light.  Neck: Normal range of motion. Neck supple.  Cardiovascular: Normal rate, regular rhythm and normal heart sounds.   Pulmonary/Chest: Effort normal and breath sounds normal. No respiratory distress. She has no wheezes.  Abdominal: Soft. Bowel sounds are normal. She exhibits no distension. There is no tenderness.  Neurological: She is alert and oriented to person, place, and time. No cranial nerve deficit or sensory deficit. She exhibits normal muscle tone. Coordination normal.  Skin: Skin is warm and dry.  Psychiatric: She has a normal mood and affect.  Nursing note and vitals reviewed.   Urgent Care Course     Procedures (including critical care time)  Labs Review Labs Reviewed - No data to display  Imaging Review No results found.   MDM   1. Dizziness   2. Anxiety    The pricking sensation on her skin/nerve is most likely due to her anxiety. No clear etiology for her dizziness today but she is neurologically intact, vital signs are appropriate (repeated BP is 145/83), and she appears well. There was no concerning clinical finding would would justify the need to go to ER. Her orthostatic vital is negative but patient does admit to drinking less water recently. I feel comfortable to send patient home to follow up outpatient.  Informed to take rest and drink more water to stay well hydrated and f/u with with Dr. Shawnie Pons (PCP) next week for re-evaluation. Informed to go to ER if symptoms gets worst. Informed to see the therapist for her anxiety. Patient denies any questions.    Lucia Estelle, NP 07/24/16 1331

## 2016-07-24 NOTE — Discharge Instructions (Signed)
Please see Dr. Shawnie PonsPratt next week for further evaluation of your dizziness. Please get in with the therapist to have your anxiety better managed.

## 2016-07-24 NOTE — ED Triage Notes (Signed)
PT reports she has had spells of feeling "loopy" and dizzy. PT is a diabetic and has history of HTN. PT reports glucose has been in 160s and 170s this morning. PT reports dizziness started this AM at 0630. PT was seen by a doctor for the same complaint on Tuesday.

## 2016-07-29 ENCOUNTER — Encounter: Payer: Self-pay | Admitting: Pharmacist

## 2016-07-29 ENCOUNTER — Ambulatory Visit (INDEPENDENT_AMBULATORY_CARE_PROVIDER_SITE_OTHER): Payer: Self-pay | Admitting: Pharmacist

## 2016-07-29 DIAGNOSIS — I10 Essential (primary) hypertension: Secondary | ICD-10-CM

## 2016-07-29 DIAGNOSIS — E119 Type 2 diabetes mellitus without complications: Secondary | ICD-10-CM

## 2016-07-29 MED ORDER — GLIMEPIRIDE 4 MG PO TABS: 4.0000 mg | ORAL_TABLET | Freq: Every day | ORAL | 3 refills | Status: DC

## 2016-07-29 MED ORDER — CARVEDILOL 6.25 MG PO TABS: 6.2500 mg | ORAL_TABLET | Freq: Two times a day (BID) | ORAL | 3 refills | Status: DC

## 2016-07-29 MED ORDER — HYDROCHLOROTHIAZIDE 12.5 MG PO CAPS: 12.5000 mg | ORAL_CAPSULE | Freq: Every day | ORAL | 3 refills | Status: DC

## 2016-07-29 MED ORDER — LOSARTAN POTASSIUM 50 MG PO TABS
50.0000 mg | ORAL_TABLET | Freq: Every day | ORAL | 3 refills | Status: DC
Start: 2016-07-29 — End: 2017-07-16

## 2016-07-29 MED ORDER — METFORMIN HCL ER 500 MG PO TB24: 1000.0000 mg | ORAL_TABLET | Freq: Every day | ORAL | 2 refills | Status: DC

## 2016-07-29 NOTE — Assessment & Plan Note (Signed)
Hypertension longstanding and currently controlled.  Patient reports adherence with medications, however, has had recent dizziness. She reports being concerned about the number of medications she is on. Per documentation from urgent care her orthostatic evaluation was negative, but noted patient reports of decreased water intake. Due to ongoing issue with dizziness, will cut HCTZ to 1/2 tablet (12.5 mg) daily. Will also discontinue atenolol and replace with low-dose carvedilol (6.25mg  BID) to decrease metabolic interactions from her medications. At next visit, consider increasing losartan to 100mg  or titrating carvedilol.  I anticipate the reduction of HCTZ and change from atenolol to carvedilol with modestly assist with lowering CBG.

## 2016-07-29 NOTE — Patient Instructions (Addendum)
Continue metformin and glimepiride as ordered.   Try to stick with 1-2 doses of bread per day. Increase your vegetable intake and be careful not to eat too many fruits.   Continue to check your blood glucose as you have been. Great job keeping record of these!   Make changes in position slowly, to avoid sudden changes to your blood pressure.   Also keep drinking water to make sure you don't get dehydrated.   Stop taking atenolol.  Start taking carvedilol 6.25 mg twice daily.   [employer] Losartan 50mg  once daily Glimepiride 4mg  once daily  HCTZ 12.5mg  once daily   [employer] Carvedilol 6.25mg  twice daily  Metformin XR 500mg  two tablets once daily   Follow-up with PCP in March. Dr. Shawnie PonsPratt can let you know if you need to see Dr. Raymondo BandKoval again in the future.

## 2016-07-29 NOTE — Progress Notes (Signed)
Patient ID: [name], female   DOB: 01/19/1961, 56 y.o.   MRN: [id number] Reviewed: Agree with Dr. Macky LowerKoval's documentation and management.

## 2016-07-29 NOTE — Assessment & Plan Note (Signed)
Diabetes longstanding and uncontrolled, however, slightly improved CBGs with use of Metformin XR and sulfonylurea (used up both glimepiride and glipizide supplies since last visit).  Patient denies hypoglycemic events and is able to verbalize appropriate hypoglycemia management plan. Patient reports adherence with medications. Patient expressed some concerns regarding drug costs. Spoke at length with patient about drug pricing from different pharmacies. Together, we identified which pharmacy to use for best pricing of her medications.  Since her blood glucose has been consistently <200, no changes were made to her diabetes medications at this time. Encouraged patient to cut back on fried food and carbohydrate intake. She reports she is motivated to make these changes. Prescriptions for her metformin XR and glimepiride were refilled. Her next A1C anticipated April 2018.

## 2016-07-29 NOTE — Progress Notes (Signed)
S:    Patient arrives stating "she is not doing well" but ambulating without assistance.  Presents for diabetes evaluation, education, and management at the request of Dr Shawnie Pons.  Patient was last seen by Primary Care Provider on 07/20/16.   Reports issues with bowels when she is stressed out.  Normally Activia helps with this or Miralax, IBGard.  She reports she can go but cannot get emptied. Since being back on metformin she goes 5-6 times a day with loose or regular stools but still cannot get "emptied."  She has also tried stool softeners/MiraLax which still do not provide complete evacuation. This issue has been ongoing for years but she doesn't know if she has discussed this with Dr Shawnie Pons.   She reports she went to Urgent Care recently for a prickling feeling running down her left side, reportedly related to anxiety. Patient has refused therapy after visit with Dr Kennon Rounds regarding the tingling, but states today she will think about it. She also reports recent dizziness which was evaluated at urgent care. She states they informed her it was likely dehydration-related. She reports increasing her water intake since then, which has improved her dizziness.  She also reports spells when she wakes up at night where she feels "drunk" bumping into stuff with dizziness.  She reports her last episode was this past Saturday.  She has been unable to work several days this week.    Patient reports Diabetes was diagnosed in 2016. She denies hypoglycemic events and monitors/records her sugars most days. She has provided these values below.  Patient reports adherence with medications.  Current diabetes medications include: glimepiride 4 gm daily, metformin XR 500 mg BID Current hypertension medications include: HCTZ 25 mg daily, losartan 50 mg daily, atenolol 50 mg daily  Reports most blood glucose in the high 100s (~180) mostly fasting but sometimes in the evening.  Reports lowest value of 145 and highest  value of 269.   She is starting to test herself more. She records her blood sugars in her calendar.   O:  Lab Results  Component Value Date   HGBA1C 9.4 06/10/2016   Vitals:   07/29/16 1107  BP: 138/84  Pulse: 68    A/P: Diabetes longstanding and uncontrolled, however, slightly improved CBGs with use of Metformin XR and sulfonylurea (used up both glimepiride and glipizide supplies since last visit).  Patient denies hypoglycemic events and is able to verbalize appropriate hypoglycemia management plan. Patient reports adherence with medications. Patient expressed some concerns regarding drug costs. Spoke at length with patient about drug pricing from different pharmacies. Together, we identified which pharmacy to use for best pricing of her medications.  Since her blood glucose has been consistently <200, no changes were made to her diabetes medications at this time. Encouraged patient to cut back on fried food and carbohydrate intake. She reports she is motivated to make these changes. Prescriptions for her metformin XR and glimepiride were refilled. Her next A1C anticipated April 2018.    Hypertension longstanding and currently controlled.  Patient reports adherence with medications, however, has had recent dizziness. She reports being concerned about the number of medications she is on. Per documentation from urgent care her orthostatic evaluation was negative, but noted patient reports of decreased water intake. Due to ongoing issue with dizziness, will cut HCTZ to 1/2 tablet (12.5 mg) daily. Will also discontinue atenolol and replace with low-dose carvedilol (6.25mg  BID) to decrease metabolic interactions from her medications. At next visit, consider  increasing losartan to 100mg  or titrating carvedilol.  I anticipate the reduction of HCTZ and change from atenolol to carvedilol with modestly assist with lowering CBG.    Encouraged patient to continue drinking water and to be cautious in making  position changes.   Written patient instructions provided.  Total time in face to face counseling 90 minutes.   Follow up in Pharmacist Clinic Visit per Dr. Shawnie PonsPratt.   Patient seen with Dietrich PatesKatie Cook, PharmD PGY1 Resident and Hazle NordmannKelsy Combs, PharmD PGY2 Resident.

## 2016-08-19 ENCOUNTER — Ambulatory Visit (INDEPENDENT_AMBULATORY_CARE_PROVIDER_SITE_OTHER): Payer: Self-pay | Admitting: Family Medicine

## 2016-08-19 ENCOUNTER — Encounter: Payer: Self-pay | Admitting: Family Medicine

## 2016-08-19 DIAGNOSIS — I10 Essential (primary) hypertension: Secondary | ICD-10-CM

## 2016-08-19 DIAGNOSIS — E119 Type 2 diabetes mellitus without complications: Secondary | ICD-10-CM

## 2016-08-19 NOTE — Progress Notes (Signed)
   Subjective:    Patient ID: Carmen ComesFrances B Rau is a 56 y.o. female presenting with Follow-up (diabetes)  on 08/19/2016  HPI: Has seen Dr. Raymondo BandKoval and he changed several of her meds. Her BP control appears to be improved. Her log book shows BS in the 180's to 190's.  On HCTZ 12.5 mg capsules and carvedilol 6.25. BP is improved. She is on Metformin XR and Amaryl which is working ok. Reports some Bowel issues. Has some tingling on her left side when she had a BM. It continued into the next day. Then it got better. Has h/o waking up and feels like she is drunk and stumbles around. Drinking water.  Review of Systems  Constitutional: Negative for chills and fever.  Respiratory: Negative for shortness of breath.   Cardiovascular: Negative for chest pain.  Gastrointestinal: Negative for abdominal pain, nausea and vomiting.  Genitourinary: Negative for dysuria.  Skin: Negative for rash.      Objective:    BP 132/78   Pulse 96   Temp 98.1 F (36.7 C) (Oral)   Wt 219 lb 9.6 oz (99.6 kg)   SpO2 95%   BMI 40.17 kg/m  Physical Exam  Constitutional: She is oriented to person, place, and time. She appears well-developed and well-nourished. No distress.  HENT:  Head: Normocephalic and atraumatic.  Eyes: No scleral icterus.  Neck: Neck supple.  Cardiovascular: Normal rate.   Pulmonary/Chest: Effort normal.  Abdominal: Soft.  Neurological: She is alert and oriented to person, place, and time.  Skin: Skin is warm and dry.  Psychiatric: She has a normal mood and affect.        Assessment & Plan:   Problem List Items Addressed This Visit      High   HYPERTENSION, BENIGN SYSTEMIC (Chronic)    Excellent control today--she wants to stop the HCTZ to se if that stops her dizzy spells. May need to increase carvedilol if stays up. Continue Losartan and check BP's.      Diabetes mellitus without complication (HCC)    Continue metformin and amaryl. Diet and add exercise.            Total face-to-face time with patient: 15 minutes. Over 50% of encounter was spent on counseling and coordination of care. Return in about 3 months (around 11/19/2016).  Reva Boresanya S Teigan Sahli 08/19/2016 4:15 PM

## 2016-08-19 NOTE — Assessment & Plan Note (Signed)
Excellent control today--she wants to stop the HCTZ to se if that stops her dizzy spells. May need to increase carvedilol if stays up. Continue Losartan and check BP's.

## 2016-08-19 NOTE — Patient Instructions (Signed)
DASH Eating Plan DASH stands for "Dietary Approaches to Stop Hypertension." The DASH eating plan is a healthy eating plan that has been shown to reduce high blood pressure (hypertension). It may also reduce your risk for type 2 diabetes, heart disease, and stroke. The DASH eating plan may also help with weight loss. What are tips for following this plan? General guidelines   Avoid eating more than 2,300 mg (milligrams) of salt (sodium) a day. If you have hypertension, you may need to reduce your sodium intake to 1,500 mg a day.  Limit alcohol intake to no more than 1 drink a day for nonpregnant women and 2 drinks a day for men. One drink equals 12 oz of beer, 5 oz of wine, or 1 oz of hard liquor.  Work with your health care provider to maintain a healthy body weight or to lose weight. Ask what an ideal weight is for you.  Get at least 30 minutes of exercise that causes your heart to beat faster (aerobic exercise) most days of the week. Activities may include walking, swimming, or biking.  Work with your health care provider or diet and nutrition specialist (dietitian) to adjust your eating plan to your individual calorie needs. Reading food labels   Check food labels for the amount of sodium per serving. Choose foods with less than 5 percent of the Daily Value of sodium. Generally, foods with less than 300 mg of sodium per serving fit into this eating plan.  To find whole grains, look for the word "whole" as the first word in the ingredient list. Shopping   Buy products labeled as "low-sodium" or "no salt added."  Buy fresh foods. Avoid canned foods and premade or frozen meals. Cooking   Avoid adding salt when cooking. Use salt-free seasonings or herbs instead of table salt or sea salt. Check with your health care provider or pharmacist before using salt substitutes.  Do not fry foods. Cook foods using healthy methods such as baking, boiling, grilling, and broiling instead.  Cook with  heart-healthy oils, such as olive, canola, soybean, or sunflower oil. Meal planning    Eat a balanced diet that includes:  5 or more servings of fruits and vegetables each day. At each meal, try to fill half of your plate with fruits and vegetables.  Up to 6-8 servings of whole grains each day.  Less than 6 oz of lean meat, poultry, or fish each day. A 3-oz serving of meat is about the same size as a deck of cards. One egg equals 1 oz.  2 servings of low-fat dairy each day.  A serving of nuts, seeds, or beans 5 times each week.  Heart-healthy fats. Healthy fats called Omega-3 fatty acids are found in foods such as flaxseeds and coldwater fish, like sardines, salmon, and mackerel.  Limit how much you eat of the following:  Canned or prepackaged foods.  Food that is high in trans fat, such as fried foods.  Food that is high in saturated fat, such as fatty meat.  Sweets, desserts, sugary drinks, and other foods with added sugar.  Full-fat dairy products.  Do not salt foods before eating.  Try to eat at least 2 vegetarian meals each week.  Eat more home-cooked food and less restaurant, buffet, and fast food.  When eating at a restaurant, ask that your food be prepared with less salt or no salt, if possible. What foods are recommended? The items listed may not be a complete list. Talk   with your dietitian about what dietary choices are best for you. Grains  Whole-grain or whole-wheat bread. Whole-grain or whole-wheat pasta. Brown rice. Oatmeal. Quinoa. Bulgur. Whole-grain and low-sodium cereals. Pita bread. Low-fat, low-sodium crackers. Whole-wheat flour tortillas. Vegetables  Fresh or frozen vegetables (raw, steamed, roasted, or grilled). Low-sodium or reduced-sodium tomato and vegetable juice. Low-sodium or reduced-sodium tomato sauce and tomato paste. Low-sodium or reduced-sodium canned vegetables. Fruits  All fresh, dried, or frozen fruit. Canned fruit in natural juice  (without added sugar). Meat and other protein foods  Skinless chicken or turkey. Ground chicken or turkey. Pork with fat trimmed off. Fish and seafood. Egg whites. Dried beans, peas, or lentils. Unsalted nuts, nut butters, and seeds. Unsalted canned beans. Lean cuts of beef with fat trimmed off. Low-sodium, lean deli meat. Dairy  Low-fat (1%) or fat-free (skim) milk. Fat-free, low-fat, or reduced-fat cheeses. Nonfat, low-sodium ricotta or cottage cheese. Low-fat or nonfat yogurt. Low-fat, low-sodium cheese. Fats and oils  Soft margarine without trans fats. Vegetable oil. Low-fat, reduced-fat, or light mayonnaise and salad dressings (reduced-sodium). Canola, safflower, olive, soybean, and sunflower oils. Avocado. Seasoning and other foods  Herbs. Spices. Seasoning mixes without salt. Unsalted popcorn and pretzels. Fat-free sweets. What foods are not recommended? The items listed may not be a complete list. Talk with your dietitian about what dietary choices are best for you. Grains  Baked goods made with fat, such as croissants, muffins, or some breads. Dry pasta or rice meal packs. Vegetables  Creamed or fried vegetables. Vegetables in a cheese sauce. Regular canned vegetables (not low-sodium or reduced-sodium). Regular canned tomato sauce and paste (not low-sodium or reduced-sodium). Regular tomato and vegetable juice (not low-sodium or reduced-sodium). Pickles. Olives. Fruits  Canned fruit in a light or heavy syrup. Fried fruit. Fruit in cream or butter sauce. Meat and other protein foods  Fatty cuts of meat. Ribs. Fried meat. Bacon. Sausage. Bologna and other processed lunch meats. Salami. Fatback. Hotdogs. Bratwurst. Salted nuts and seeds. Canned beans with added salt. Canned or smoked fish. Whole eggs or egg yolks. Chicken or turkey with skin. Dairy  Whole or 2% milk, cream, and half-and-half. Whole or full-fat cream cheese. Whole-fat or sweetened yogurt. Full-fat cheese. Nondairy creamers.  Whipped toppings. Processed cheese and cheese spreads. Fats and oils  Butter. Stick margarine. Lard. Shortening. Ghee. Bacon fat. Tropical oils, such as coconut, palm kernel, or palm oil. Seasoning and other foods  Salted popcorn and pretzels. Onion salt, garlic salt, seasoned salt, table salt, and sea salt. Worcestershire sauce. Tartar sauce. Barbecue sauce. Teriyaki sauce. Soy sauce, including reduced-sodium. Steak sauce. Canned and packaged gravies. Fish sauce. Oyster sauce. Cocktail sauce. Horseradish that you find on the shelf. Ketchup. Mustard. Meat flavorings and tenderizers. Bouillon cubes. Hot sauce and Tabasco sauce. Premade or packaged marinades. Premade or packaged taco seasonings. Relishes. Regular salad dressings. Where to find more information:  National Heart, Lung, and Blood Institute: www.nhlbi.nih.gov  American Heart Association: www.heart.org Summary  The DASH eating plan is a healthy eating plan that has been shown to reduce high blood pressure (hypertension). It may also reduce your risk for type 2 diabetes, heart disease, and stroke.  With the DASH eating plan, you should limit salt (sodium) intake to 2,300 mg a day. If you have hypertension, you may need to reduce your sodium intake to 1,500 mg a day.  When on the DASH eating plan, aim to eat more fresh fruits and vegetables, whole grains, lean proteins, low-fat dairy, and heart-healthy fats.  Work   with your health care provider or diet and nutrition specialist (dietitian) to adjust your eating plan to your individual calorie needs. This information is not intended to replace advice given to you by your health care provider. Make sure you discuss any questions you have with your health care provider. Document Released: 05/13/2011 Document Revised: 05/17/2016 Document Reviewed: 05/17/2016 Elsevier Interactive Patient Education  2017 [employer]. Carbohydrate Counting for Diabetes Mellitus, Adult Carbohydrate counting  is a method for keeping track of how many carbohydrates you eat. Eating carbohydrates naturally increases the amount of sugar (glucose) in the blood. Counting how many carbohydrates you eat helps keep your blood glucose within normal limits, which helps you manage your diabetes (diabetes mellitus). It is important to know how many carbohydrates you can safely have in each meal. This is different for every person. A diet and nutrition specialist (registered dietitian) can help you make a meal plan and calculate how many carbohydrates you should have at each meal and snack. Carbohydrates are found in the following foods:  Grains, such as breads and cereals.  Dried beans and soy products.  Starchy vegetables, such as potatoes, peas, and corn.  Fruit and fruit juices.  Milk and yogurt.  Sweets and snack foods, such as cake, cookies, candy, chips, and soft drinks. How do I count carbohydrates? There are two ways to count carbohydrates in food. You can use either of the methods or a combination of both. Reading "Nutrition Facts" on packaged food  The "Nutrition Facts" list is included on the labels of almost all packaged foods and beverages in the [place] It includes:  The serving size.  Information about nutrients in each serving, including the grams (g) of carbohydrate per serving. To use the "Nutrition Facts":  Decide how many servings you will have.  Multiply the number of servings by the number of carbohydrates per serving.  The resulting number is the total amount of carbohydrates that you will be having. Learning standard serving sizes of other foods  When you eat foods containing carbohydrates that are not packaged or do not include "Nutrition Facts" on the label, you need to measure the servings in order to count the amount of carbohydrates:  Measure the foods that you will eat with a food scale or measuring cup, if needed.  Decide how many standard-size servings you will  eat.  Multiply the number of servings by 15. Most carbohydrate-rich foods have about 15 g of carbohydrates per serving.  For example, if you eat 8 oz (170 g) of strawberries, you will have eaten 2 servings and 30 g of carbohydrates (2 servings x 15 g = 30 g).  For foods that have more than one food mixed, such as soups and casseroles, you must count the carbohydrates in each food that is included. The following list contains standard serving sizes of common carbohydrate-rich foods. Each of these servings has about 15 g of carbohydrates:   hamburger bun or  English muffin.   oz (15 mL) syrup.   oz (14 g) jelly.  1 slice of bread.  1 six-inch tortilla.  3 oz (85 g) cooked rice or pasta.  4 oz (113 g) cooked dried beans.  4 oz (113 g) starchy vegetable, such as peas, corn, or potatoes.  4 oz (113 g) hot cereal.  4 oz (113 g) mashed potatoes or  of a large baked potato.  4 oz (113 g) canned or frozen fruit.  4 oz (120 mL) fruit juice.  4-6 crackers.  6 chicken nuggets.  6 oz (170 g) unsweetened dry cereal.  6 oz (170 g) plain fat-free yogurt or yogurt sweetened with artificial sweeteners.  8 oz (240 mL) milk.  8 oz (170 g) fresh fruit or one small piece of fruit.  24 oz (680 g) popped popcorn. Example of carbohydrate counting Sample meal   3 oz (85 g) chicken breast.  6 oz (170 g) brown rice.  4 oz (113 g) corn.  8 oz (240 mL) milk.  8 oz (170 g) strawberries with sugar-free whipped topping. Carbohydrate calculation  1. Identify the foods that contain carbohydrates:  Rice.  Corn.  Milk.  Strawberries. 2. Calculate how many servings you have of each food:  2 servings rice.  1 serving corn.  1 serving milk.  1 serving strawberries. 3. Multiply each number of servings by 15 g:  2 servings rice x 15 g = 30 g.  1 serving corn x 15 g = 15 g.  1 serving milk x 15 g = 15 g.  1 serving strawberries x 15 g = 15 g. 4. Add together all of the  amounts to find the total grams of carbohydrates eaten:  30 g + 15 g + 15 g + 15 g = 75 g of carbohydrates total. This information is not intended to replace advice given to you by your health care provider. Make sure you discuss any questions you have with your health care provider. Document Released: 05/24/2005 Document Revised: 12/12/2015 Document Reviewed: 11/05/2015 Elsevier Interactive Patient Education  2017 [employer].

## 2016-08-19 NOTE — Assessment & Plan Note (Signed)
Continue metformin and amaryl. Diet and add exercise.

## 2016-09-27 ENCOUNTER — Ambulatory Visit: Payer: Self-pay

## 2016-09-27 NOTE — Progress Notes (Unsigned)
POC

## 2016-10-13 ENCOUNTER — Ambulatory Visit: Payer: Self-pay | Admitting: Family Medicine

## 2016-10-18 ENCOUNTER — Ambulatory Visit: Payer: Self-pay | Admitting: Family Medicine

## 2016-11-19 ENCOUNTER — Other Ambulatory Visit: Payer: Self-pay | Admitting: Family Medicine

## 2016-11-19 DIAGNOSIS — E119 Type 2 diabetes mellitus without complications: Secondary | ICD-10-CM

## 2016-11-19 DIAGNOSIS — I10 Essential (primary) hypertension: Secondary | ICD-10-CM

## 2016-11-26 ENCOUNTER — Other Ambulatory Visit: Payer: Self-pay | Admitting: Family Medicine

## 2016-11-26 DIAGNOSIS — I10 Essential (primary) hypertension: Secondary | ICD-10-CM

## 2016-11-26 NOTE — Telephone Encounter (Signed)
Refill request for 90 day supply.  Martin, Tamika L, RN  

## 2016-12-17 ENCOUNTER — Ambulatory Visit (INDEPENDENT_AMBULATORY_CARE_PROVIDER_SITE_OTHER): Payer: Self-pay | Admitting: Internal Medicine

## 2016-12-17 ENCOUNTER — Encounter: Payer: Self-pay | Admitting: Internal Medicine

## 2016-12-17 VITALS — BP 130/80 | HR 92 | Temp 98.1°F | Wt 222.6 lb

## 2016-12-17 DIAGNOSIS — R42 Dizziness and giddiness: Secondary | ICD-10-CM

## 2016-12-17 DIAGNOSIS — I951 Orthostatic hypotension: Secondary | ICD-10-CM

## 2016-12-17 MED ORDER — MECLIZINE HCL 12.5 MG PO TABS: 12.5000 mg | ORAL_TABLET | Freq: Three times a day (TID) | ORAL | 0 refills | Status: DC | PRN

## 2016-12-17 NOTE — Progress Notes (Signed)
   [facility] Noralee CharsAsiyah Richie Vadala, MD Phone: [phone]  Reason For Visit:SDA Dizziness   # SDA for Dizziness - Started last year  - Gets up from laying down to standing and then she feels woozy  - She has had a couple of times when she has been laying down in the bed when she rolls over she feels like the room is spinning  - Sometime positional, other times where it is not positional  - Denies any hearing loss  - No nausea and vomiting  - No focal or sensation changes, no blurry vision  - No palpations, no blood in stools or in urine  - No chest pain, no SOB, no palpations   Past Medical History Reviewed problem list.  Medications- reviewed and updated No additions to family history Social history- patient is a non-smoker  Objective: BP 130/80 (BP Location: Left Wrist, Patient Position: Sitting, Cuff Size: Normal)   Pulse 92   Temp 98.1 F (36.7 C) (Oral)   Wt 222 lb 9.6 oz (101 kg)   SpO2 98%   BMI 40.71 kg/m  Gen: NAD, alert, cooperative with exam Cardio: regular rate and rhythm, S1S2 heard, no murmurs appreciated Pulm: clear to auscultation bilaterally, no wheezes, rhonchi or rales Neurologic exam : Cn 2-7 intact Strength equal & normal in upper & lower extremities Able to walk on heels and toes.   Balance normal  Finger to nose   Assessment/Plan: See problem based a/p  Orthostatic hypotension Blood pressure medications could be contributing, given positional nature. - orthostatic blood pressures negative  - Counseled patient to slowly getting up from laying down to standing    Vertigo Possibly vertigo - normal neurologic exam, no nystagmus, make BPPV unlikely,negative dix-hallpike maneuvar, no hearing symptoms. Unlikely Meniere given lack of hearing issues. Possibly vestibular neuritis. Trial meclizine prn for this issues

## 2016-12-17 NOTE — Patient Instructions (Signed)
Please follow-up with your PCP. For your orthostatic hypotension please trying to get up more slowly from laying down to standing. When you are having dizziness while just sitting and laying in bed you can try meclizine for this

## 2016-12-20 DIAGNOSIS — R42 Dizziness and giddiness: Secondary | ICD-10-CM | POA: Insufficient documentation

## 2016-12-20 DIAGNOSIS — I951 Orthostatic hypotension: Secondary | ICD-10-CM | POA: Insufficient documentation

## 2016-12-20 NOTE — Assessment & Plan Note (Signed)
Possibly vertigo - normal neurologic exam, no nystagmus, make BPPV unlikely,negative dix-hallpike maneuvar, no hearing symptoms. Unlikely Meniere given lack of hearing issues. Possibly vestibular neuritis. Trial meclizine prn for this issues

## 2016-12-20 NOTE — Assessment & Plan Note (Addendum)
Blood pressure medications could be contributing, given positional nature. - orthostatic blood pressures negative  - Counseled patient to slowly getting up from laying down to standing

## 2017-01-05 ENCOUNTER — Ambulatory Visit (INDEPENDENT_AMBULATORY_CARE_PROVIDER_SITE_OTHER): Payer: Self-pay | Admitting: Family Medicine

## 2017-01-05 ENCOUNTER — Encounter: Payer: Self-pay | Admitting: Family Medicine

## 2017-01-05 VITALS — BP 138/98 | Ht 62.0 in | Wt 222.0 lb

## 2017-01-05 DIAGNOSIS — J069 Acute upper respiratory infection, unspecified: Secondary | ICD-10-CM

## 2017-01-05 DIAGNOSIS — E119 Type 2 diabetes mellitus without complications: Secondary | ICD-10-CM

## 2017-01-05 DIAGNOSIS — I10 Essential (primary) hypertension: Secondary | ICD-10-CM

## 2017-01-05 DIAGNOSIS — K5904 Chronic idiopathic constipation: Secondary | ICD-10-CM | POA: Insufficient documentation

## 2017-01-05 MED ORDER — DOCUSATE SODIUM 100 MG PO CAPS: 100.0000 mg | ORAL_CAPSULE | Freq: Two times a day (BID) | ORAL | 2 refills | Status: DC | PRN

## 2017-01-05 NOTE — Assessment & Plan Note (Signed)
Advised stool softener, increase fiber and water. Mag Citrate 1x/wk as needed. Asked about Linzess--but likely too expensive.

## 2017-01-05 NOTE — Assessment & Plan Note (Signed)
Bp ok off her HCTZ. Continue coreg and Cozaar.

## 2017-01-05 NOTE — Patient Instructions (Signed)
Diabetes Mellitus and Food It is important for you to manage your blood sugar (glucose) level. Your blood glucose level can be greatly affected by what you eat. Eating healthier foods in the appropriate amounts throughout the day at about the same time each day will help you control your blood glucose level. It can also help slow or prevent worsening of your diabetes mellitus. Healthy eating may even help you improve the level of your blood pressure and reach or maintain a healthy weight. General recommendations for healthful eating and cooking habits include:  Eating meals and snacks regularly. Avoid going long periods of time without eating to lose weight.  Eating a diet that consists mainly of plant-based foods, such as fruits, vegetables, nuts, legumes, and whole grains.  Using low-heat cooking methods, such as baking, instead of high-heat cooking methods, such as deep frying.  Work with your dietitian to make sure you understand how to use the Nutrition Facts information on food labels. How can food affect me? Carbohydrates Carbohydrates affect your blood glucose level more than any other type of food. Your dietitian will help you determine how many carbohydrates to eat at each meal and teach you how to count carbohydrates. Counting carbohydrates is important to keep your blood glucose at a healthy level, especially if you are using insulin or taking certain medicines for diabetes mellitus. Alcohol Alcohol can cause sudden decreases in blood glucose (hypoglycemia), especially if you use insulin or take certain medicines for diabetes mellitus. Hypoglycemia can be a life-threatening condition. Symptoms of hypoglycemia (sleepiness, dizziness, and disorientation) are similar to symptoms of having too much alcohol. If your health care provider has given you approval to drink alcohol, do so in moderation and use the following guidelines:  Women should not have more than one drink per day, and men  should not have more than two drinks per day. One drink is equal to: ? 12 oz of beer. ? 5 oz of wine. ? 1 oz of hard liquor.  Do not drink on an empty stomach.  Keep yourself hydrated. Have water, diet soda, or unsweetened iced tea.  Regular soda, juice, and other mixers might contain a lot of carbohydrates and should be counted.  What foods are not recommended? As you make food choices, it is important to remember that all foods are not the same. Some foods have fewer nutrients per serving than other foods, even though they might have the same number of calories or carbohydrates. It is difficult to get your body what it needs when you eat foods with fewer nutrients. Examples of foods that you should avoid that are high in calories and carbohydrates but low in nutrients include:  Trans fats (most processed foods list trans fats on the Nutrition Facts label).  Regular soda.  Juice.  Candy.  Sweets, such as cake, pie, doughnuts, and cookies.  Fried foods.  What foods can I eat? Eat nutrient-rich foods, which will nourish your body and keep you healthy. The food you should eat also will depend on several factors, including:  The calories you need.  The medicines you take.  Your weight.  Your blood glucose level.  Your blood pressure level.  Your cholesterol level.  You should eat a variety of foods, including:  Protein. ? Lean cuts of meat. ? Proteins low in saturated fats, such as fish, egg whites, and beans. Avoid processed meats.  Fruits and vegetables. ? Fruits and vegetables that may help control blood glucose levels, such as apples,   mangoes, and yams.  Dairy products. ? Choose fat-free or low-fat dairy products, such as milk, yogurt, and cheese.  Grains, bread, pasta, and rice. ? Choose whole grain products, such as multigrain bread, whole oats, and brown rice. These foods may help control blood pressure.  Fats. ? Foods containing healthful fats, such as  nuts, avocado, olive oil, canola oil, and fish.  Does everyone with diabetes mellitus have the same meal plan? Because every person with diabetes mellitus is different, there is not one meal plan that works for everyone. It is very important that you meet with a dietitian who will help you create a meal plan that is just right for you. This information is not intended to replace advice given to you by your health care provider. Make sure you discuss any questions you have with your health care provider. Document Released: 02/18/2005 Document Revised: 10/30/2015 Document Reviewed: 04/20/2013 Elsevier Interactive Patient Education  2017 Elsevier Inc.  

## 2017-01-05 NOTE — Assessment & Plan Note (Addendum)
Continue Glucophage and Amaryl--needs eye and foot esam and A1C next visit

## 2017-01-05 NOTE — Progress Notes (Signed)
   Subjective:    Patient ID: Carmen [name] is a 56 y.o. female presenting with No chief complaint on file.  on 01/05/2017  HPI: Notes that she has cough and congestion x 1 wk. Over last weekend had URI and had drainage and hoarseness. Taking her Nasacort and sudafed.  States that she is under a lot of stress. Her grand kids are possibly going to be put up for adoption. Still working her job at [employer]. Reports BS are continues to be high. Her food stamps were decreased and she is eating bread and junk food every day. Feels like she is quite constipated and it is not improving with dulcolax.  Review of Systems  Constitutional: Negative for chills and fever.  Respiratory: Negative for shortness of breath.   Cardiovascular: Negative for chest pain.  Gastrointestinal: Negative for abdominal pain, nausea and vomiting.  Genitourinary: Negative for dysuria.  Skin: Negative for rash.      Objective:    BP (!) 138/98   Ht 5\' 2"  (1.575 m)   Wt 222 lb (100.7 kg)   BMI 40.60 kg/m  Physical Exam  Constitutional: She is oriented to person, place, and time. She appears well-developed and well-nourished. No distress.  HENT:  Head: Normocephalic and atraumatic.  Eyes: No scleral icterus.  Neck: Neck supple.  Cardiovascular: Normal rate.   Pulmonary/Chest: Effort normal.  Abdominal: Soft.  Neurological: She is alert and oriented to person, place, and time.  Skin: Skin is warm and dry.  Psychiatric: She has a normal mood and affect.        Assessment & Plan:   Problem List Items Addressed This Visit      High   HYPERTENSION, BENIGN SYSTEMIC - Primary (Chronic)    Bp ok off her HCTZ. Continue coreg and Cozaar.      Diabetes mellitus without complication (HCC)    Continue Glucophage and Amaryl--needs eye and foot esam and A1C next visit        Unprioritized   Chronic idiopathic constipation    Advised stool softener, increase fiber and water. Mag Citrate 1x/wk as needed.  Asked about Linzess--but likely too expensive.      Relevant Medications   docusate sodium (COLACE) 100 MG capsule    Other Visit Diagnoses    Viral upper respiratory tract infection       supportive care      Total face-to-face time with patient: 25 minutes. Over 50% of encounter was spent on counseling and coordination of care. Return in about 3 months (around 04/07/2017) for a follow-up.  Reva Boresanya S Milda Lindvall 01/05/2017 4:42 PM

## 2017-02-15 ENCOUNTER — Other Ambulatory Visit: Payer: Self-pay | Admitting: Family Medicine

## 2017-02-15 DIAGNOSIS — E119 Type 2 diabetes mellitus without complications: Secondary | ICD-10-CM

## 2017-02-15 DIAGNOSIS — I10 Essential (primary) hypertension: Secondary | ICD-10-CM

## 2017-02-20 ENCOUNTER — Other Ambulatory Visit: Payer: Self-pay | Admitting: Family Medicine

## 2017-02-20 DIAGNOSIS — I10 Essential (primary) hypertension: Secondary | ICD-10-CM

## 2017-03-02 ENCOUNTER — Ambulatory Visit (INDEPENDENT_AMBULATORY_CARE_PROVIDER_SITE_OTHER): Payer: Self-pay | Admitting: Family Medicine

## 2017-03-02 ENCOUNTER — Ambulatory Visit: Payer: Self-pay | Admitting: Family Medicine

## 2017-03-02 ENCOUNTER — Encounter: Payer: Self-pay | Admitting: Family Medicine

## 2017-03-02 VITALS — BP 118/64 | HR 91 | Temp 98.0°F | Ht 62.0 in | Wt 223.0 lb

## 2017-03-02 DIAGNOSIS — I10 Essential (primary) hypertension: Secondary | ICD-10-CM

## 2017-03-02 DIAGNOSIS — E119 Type 2 diabetes mellitus without complications: Secondary | ICD-10-CM

## 2017-03-02 DIAGNOSIS — M7501 Adhesive capsulitis of right shoulder: Secondary | ICD-10-CM

## 2017-03-02 LAB — POCT GLYCOSYLATED HEMOGLOBIN (HGB A1C): HEMOGLOBIN A1C: 8.2

## 2017-03-02 NOTE — Patient Instructions (Signed)
DASH Eating Plan DASH stands for "Dietary Approaches to Stop Hypertension." The DASH eating plan is a healthy eating plan that has been shown to reduce high blood pressure (hypertension). It may also reduce your risk for type 2 diabetes, heart disease, and stroke. The DASH eating plan may also help with weight loss. What are tips for following this plan? General guidelines  Avoid eating more than 2,300 mg (milligrams) of salt (sodium) a day. If you have hypertension, you may need to reduce your sodium intake to 1,500 mg a day.  Limit alcohol intake to no more than 1 drink a day for nonpregnant women and 2 drinks a day for men. One drink equals 12 oz of beer, 5 oz of wine, or 1 oz of hard liquor.  Work with your health care provider to maintain a healthy body weight or to lose weight. Ask what an ideal weight is for you.  Get at least 30 minutes of exercise that causes your heart to beat faster (aerobic exercise) most days of the week. Activities may include walking, swimming, or biking.  Work with your health care provider or diet and nutrition specialist (dietitian) to adjust your eating plan to your individual calorie needs. Reading food labels  Check food labels for the amount of sodium per serving. Choose foods with less than 5 percent of the Daily Value of sodium. Generally, foods with less than 300 mg of sodium per serving fit into this eating plan.  To find whole grains, look for the word "whole" as the first word in the ingredient list. Shopping  Buy products labeled as "low-sodium" or "no salt added."  Buy fresh foods. Avoid canned foods and premade or frozen meals. Cooking  Avoid adding salt when cooking. Use salt-free seasonings or herbs instead of table salt or sea salt. Check with your health care provider or pharmacist before using salt substitutes.  Do not fry foods. Cook foods using healthy methods such as baking, boiling, grilling, and broiling instead.  Cook with  heart-healthy oils, such as olive, canola, soybean, or sunflower oil. Meal planning   Eat a balanced diet that includes: ? 5 or more servings of fruits and vegetables each day. At each meal, try to fill half of your plate with fruits and vegetables. ? Up to 6-8 servings of whole grains each day. ? Less than 6 oz of lean meat, poultry, or fish each day. A 3-oz serving of meat is about the same size as a deck of cards. One egg equals 1 oz. ? 2 servings of low-fat dairy each day. ? A serving of nuts, seeds, or beans 5 times each week. ? Heart-healthy fats. Healthy fats called Omega-3 fatty acids are found in foods such as flaxseeds and coldwater fish, like sardines, salmon, and mackerel.  Limit how much you eat of the following: ? Canned or prepackaged foods. ? Food that is high in trans fat, such as fried foods. ? Food that is high in saturated fat, such as fatty meat. ? Sweets, desserts, sugary drinks, and other foods with added sugar. ? Full-fat dairy products.  Do not salt foods before eating.  Try to eat at least 2 vegetarian meals each week.  Eat more home-cooked food and less restaurant, buffet, and fast food.  When eating at a restaurant, ask that your food be prepared with less salt or no salt, if possible. What foods are recommended? The items listed may not be a complete list. Talk with your dietitian about what   dietary choices are best for you. Grains Whole-grain or whole-wheat bread. Whole-grain or whole-wheat pasta. Brown rice. Oatmeal. Quinoa. Bulgur. Whole-grain and low-sodium cereals. Pita bread. Low-fat, low-sodium crackers. Whole-wheat flour tortillas. Vegetables Fresh or frozen vegetables (raw, steamed, roasted, or grilled). Low-sodium or reduced-sodium tomato and vegetable juice. Low-sodium or reduced-sodium tomato sauce and tomato paste. Low-sodium or reduced-sodium canned vegetables. Fruits All fresh, dried, or frozen fruit. Canned fruit in natural juice (without  added sugar). Meat and other protein foods Skinless chicken or turkey. Ground chicken or turkey. Pork with fat trimmed off. Fish and seafood. Egg whites. Dried beans, peas, or lentils. Unsalted nuts, nut butters, and seeds. Unsalted canned beans. Lean cuts of beef with fat trimmed off. Low-sodium, lean deli meat. Dairy Low-fat (1%) or fat-free (skim) milk. Fat-free, low-fat, or reduced-fat cheeses. Nonfat, low-sodium ricotta or cottage cheese. Low-fat or nonfat yogurt. Low-fat, low-sodium cheese. Fats and oils Soft margarine without trans fats. Vegetable oil. Low-fat, reduced-fat, or light mayonnaise and salad dressings (reduced-sodium). Canola, safflower, olive, soybean, and sunflower oils. Avocado. Seasoning and other foods Herbs. Spices. Seasoning mixes without salt. Unsalted popcorn and pretzels. Fat-free sweets. What foods are not recommended? The items listed may not be a complete list. Talk with your dietitian about what dietary choices are best for you. Grains Baked goods made with fat, such as croissants, muffins, or some breads. Dry pasta or rice meal packs. Vegetables Creamed or fried vegetables. Vegetables in a cheese sauce. Regular canned vegetables (not low-sodium or reduced-sodium). Regular canned tomato sauce and paste (not low-sodium or reduced-sodium). Regular tomato and vegetable juice (not low-sodium or reduced-sodium). Pickles. Olives. Fruits Canned fruit in a light or heavy syrup. Fried fruit. Fruit in cream or butter sauce. Meat and other protein foods Fatty cuts of meat. Ribs. Fried meat. Bacon. Sausage. Bologna and other processed lunch meats. Salami. Fatback. Hotdogs. Bratwurst. Salted nuts and seeds. Canned beans with added salt. Canned or smoked fish. Whole eggs or egg yolks. Chicken or turkey with skin. Dairy Whole or 2% milk, cream, and half-and-half. Whole or full-fat cream cheese. Whole-fat or sweetened yogurt. Full-fat cheese. Nondairy creamers. Whipped toppings.  Processed cheese and cheese spreads. Fats and oils Butter. Stick margarine. Lard. Shortening. Ghee. Bacon fat. Tropical oils, such as coconut, palm kernel, or palm oil. Seasoning and other foods Salted popcorn and pretzels. Onion salt, garlic salt, seasoned salt, table salt, and sea salt. Worcestershire sauce. Tartar sauce. Barbecue sauce. Teriyaki sauce. Soy sauce, including reduced-sodium. Steak sauce. Canned and packaged gravies. Fish sauce. Oyster sauce. Cocktail sauce. Horseradish that you find on the shelf. Ketchup. Mustard. Meat flavorings and tenderizers. Bouillon cubes. Hot sauce and Tabasco sauce. Premade or packaged marinades. Premade or packaged taco seasonings. Relishes. Regular salad dressings. Where to find more information:  National Heart, Lung, and Blood Institute: www.nhlbi.nih.gov  American Heart Association: www.heart.org Summary  The DASH eating plan is a healthy eating plan that has been shown to reduce high blood pressure (hypertension). It may also reduce your risk for type 2 diabetes, heart disease, and stroke.  With the DASH eating plan, you should limit salt (sodium) intake to 2,300 mg a day. If you have hypertension, you may need to reduce your sodium intake to 1,500 mg a day.  When on the DASH eating plan, aim to eat more fresh fruits and vegetables, whole grains, lean proteins, low-fat dairy, and heart-healthy fats.  Work with your health care provider or diet and nutrition specialist (dietitian) to adjust your eating plan to your individual   calorie needs. This information is not intended to replace advice given to you by your health care provider. Make sure you discuss any questions you have with your health care provider. Document Released: 05/13/2011 Document Revised: 05/17/2016 Document Reviewed: 05/17/2016 Elsevier Interactive Patient Education  2017 Elsevier Inc.  

## 2017-03-02 NOTE — Progress Notes (Signed)
   Subjective:    Patient ID: GLORIA RICARDO is a 56 y.o. female presenting with Diabetes and Hypertension  on 03/02/2017  HPI: Here for f/u. Her kids went to court to see about getting her grand kids back. They are due in court in March. Has low back pain and right arm pain. Pain is chornic and flares occasionally. Felt bad yesterday, but has resolved today.  Worse with movement. Took Ibuprofen which helped.  Review of Systems  Constitutional: Negative for chills and fever.  Respiratory: Negative for shortness of breath.   Cardiovascular: Negative for chest pain.  Gastrointestinal: Negative for abdominal pain, nausea and vomiting.  Genitourinary: Negative for dysuria.  Skin: Negative for rash.      Objective:    BP 118/64   Pulse 91   Temp 98 F (36.7 C) (Oral)   Ht  (1.575 m)   Wt 223 lb (101.2 kg)   SpO2 98%   BMI 40.79 kg/m  Physical Exam  Constitutional: She is oriented to person, place, and time. She appears well-developed and well-nourished. No distress.  HENT:  Head: Normocephalic and atraumatic.  Eyes: No scleral icterus.  Neck: Neck supple.  Cardiovascular: Normal rate.   Pulmonary/Chest: Effort normal.  Abdominal: Soft.  Neurological: She is alert and oriented to person, place, and time.  Skin: Skin is warm and dry.  Psychiatric: She has a normal mood and affect.   Hemoglobin A1C   8.2      Assessment & Plan:   Problem List Items Addressed This Visit      High   HYPERTENSION, BENIGN SYSTEMIC (Chronic)    BP at goal--continue HCTZ and Cozaar and Coreg      Diabetes mellitus without complication (HCC) - Primary    A1C is still a bit high Dietary improvement Continue Metformin 500 mg and Amaryl 4 mg      Relevant Orders   HgB A1c (Completed)     Unprioritized   Adhesive capsulitis of shoulder    Using Ibuprofen sparingly--may continue--consider steroid injection if needed.         Total face-to-face time with patient: 25  minutes. Over 50% of encounter was spent on counseling and coordination of care. Return in about 3 months (around 06/01/2017).  Reva Bores 03/02/2017 4:27 PM

## 2017-03-02 NOTE — Assessment & Plan Note (Signed)
Using Ibuprofen sparingly--may continue--consider steroid injection if needed.

## 2017-03-02 NOTE — Assessment & Plan Note (Addendum)
A1C is still a bit high Dietary improvement Continue Metformin 500 mg and Amaryl 4 mg

## 2017-03-02 NOTE — Assessment & Plan Note (Addendum)
BP at goal--continue HCTZ and Cozaar and Coreg

## 2017-03-15 ENCOUNTER — Telehealth: Payer: Self-pay | Admitting: *Deleted

## 2017-03-15 NOTE — Telephone Encounter (Signed)
Patient left message on nurse line requesting to speak with someone regarding ear pain.  Returned called patient is having left ear pain/fluid. Appt made for tomorrow 03/16/17.  Clovis Pu, RN

## 2017-03-16 ENCOUNTER — Encounter: Payer: Self-pay | Admitting: Internal Medicine

## 2017-03-16 ENCOUNTER — Ambulatory Visit (INDEPENDENT_AMBULATORY_CARE_PROVIDER_SITE_OTHER): Payer: Self-pay | Admitting: Internal Medicine

## 2017-03-16 VITALS — BP 118/80 | HR 92 | Temp 98.3°F | Wt 223.0 lb

## 2017-03-16 DIAGNOSIS — H60542 Acute eczematoid otitis externa, left ear: Secondary | ICD-10-CM

## 2017-03-16 MED ORDER — NEOMYCIN-POLYMYXIN-HC 3.5-10000-1 OT SOLN: 4.0000 [drp] | Freq: Four times a day (QID) | OTIC | 0 refills | Status: AC

## 2017-03-16 MED ORDER — NEOMYCIN-POLYMYXIN-HC 3.5-10000-1 OT SOLN: 4.0000 [drp] | Freq: Four times a day (QID) | OTIC | 0 refills | Status: DC

## 2017-03-16 NOTE — Patient Instructions (Signed)
Use the ear drops (in your left ear) we have prescribed for about 4 more days.  Please use over the counter medication for allergies

## 2017-03-16 NOTE — Progress Notes (Signed)
   [name] Phone: [phone]   Date of Visit: 03/16/2017   HPI:  Ear Pain and Itching:  - left ear pain for the past week along with nasal congestion, itchy nose, watery eyes - she has a history of external ear infections. She has been using old ear drops and her ear symptoms have been improving  - denies any hearing loss. Reports when she tries to clean out the ear, there is some moisture in her ear canal. Otherwise no significant drainage.  - no fevers or chills - no cough or shortness of breath - she also uses decongestant for her nasal congestion. She knows she should avoid in the setting of her HTN. We also discussed about rebound congestion with prolonged use.  - she has been using OTC antihistamine for allergies and nasal spray  ROS: See HPI.  PMFSH:  PMH:  HTN Orthostatic Hypotension GERD IBS DM2 Depression/Anxiety  Borderline Personality   PHYSICAL EXAM: BP 118/80   Pulse 92   Temp 98.3 F (36.8 C) (Oral)   Wt 223 lb (101.2 kg)   SpO2 99%   BMI 40.79 kg/m  Gen: NAD, nontoxic appearing HEENT: sclerae clear, sinuses are nontender to palpation, oropharynx clear. Right TM with chronic scarring. Left External Auditory canal without erythema but there is what appears like sloughed off skin in the canal that appears moist. This was removed with tissue paper. The TM appears normal but there may be a clear effusion.  Heart: RRR. No m/r/g Lungs: normal effort, CTAB  ASSESSMENT/PLAN:  Health maintenance:  - declined Flu due to lack of insurance   Acute eczematoid otitis externa of left ear She may have had a bacterial otitis externa which resolved with her old ear drops. On exam this currently is more consistent with possible eczematous otitis externa.  - Cortisporin otic solution 4 drops QID x 4 days   Carmen Holter, MD PGY 3 [facility]

## 2017-03-25 ENCOUNTER — Encounter: Payer: Self-pay | Admitting: Family Medicine

## 2017-03-25 ENCOUNTER — Ambulatory Visit (INDEPENDENT_AMBULATORY_CARE_PROVIDER_SITE_OTHER): Payer: Self-pay | Admitting: Internal Medicine

## 2017-03-25 DIAGNOSIS — L259 Unspecified contact dermatitis, unspecified cause: Secondary | ICD-10-CM | POA: Insufficient documentation

## 2017-03-25 MED ORDER — HYDROCORTISONE 2.5 % EX CREA: TOPICAL_CREAM | Freq: Two times a day (BID) | CUTANEOUS | 0 refills | Status: DC

## 2017-03-25 MED ORDER — DIPHENHYDRAMINE-ZINC ACETATE 2-0.1 % EX CREA: 1.0000 "application " | TOPICAL_CREAM | Freq: Three times a day (TID) | CUTANEOUS | 0 refills | Status: DC | PRN

## 2017-03-25 NOTE — Assessment & Plan Note (Signed)
Possibly contact dermatitis vs arthropod bite  - She was at her mother's house and has several animals could be fleas-would expect distribution to be more on the lower legs -Unlikely bedbugs this patient denies any blood on covers or bites after sleeping at night - hydrocortisone 2.5 % cream; Apply topically 2 (two) times daily.  Dispense: 30 g; Refill: 0 - diphenhydrAMINE-zinc acetate (BENADRYL EXTRA STRENGTH) cream; Apply 1 application topically 3 (three) times daily as needed for itching.  Dispense: 28.4 g; Refill: 0

## 2017-03-25 NOTE — Progress Notes (Signed)
   [facility] Noralee CharsAsiyah Richie Bonanno, MD Phone: [phone]  Reason For Visit: SDA for Bites   #Bites  - This Saturday, patient had stayed overnight at her mothers house until Monday  - Patient's mother  does have a hx of cats and dogs - States bites all over her body- started on her stomach, then arms.  - Patient states they have been itching - Has used some over the counter hydrocoritsone cream with some relief  - 1 week before boyfriend was expose to poison oak, however not patient  - Patient had a [place] in her house as well which could have contributed  - No blood on sheets, very old mattress,   Past Medical History Reviewed problem list.  Medications- reviewed and updated No additions to family history Social history- patient is a non-smoker  Objective: BP 110/70   Pulse 84   Temp 98.4 F (36.9 C) (Oral)   Ht 5\' 2"  (1.575 m)   Wt 223 lb (101.2 kg)   SpO2 98%   BMI 40.79 kg/m  Gen: NAD, alert, cooperative with exam Skin:  Several papules with reactive erythema surrounding, no signs of infection  Neuro: Strength and sensation grossly intact   Assessment/Plan: See problem based a/p  Contact dermatitis Possibly contact dermatitis vs arthropod bite  - She was at her mother's house and has several animals could be fleas-would expect distribution to be more on the lower legs -Unlikely bedbugs this patient denies any blood on covers or bites after sleeping at night - hydrocortisone 2.5 % cream; Apply topically 2 (two) times daily.  Dispense: 30 g; Refill: 0 - diphenhydrAMINE-zinc acetate (BENADRYL EXTRA STRENGTH) cream; Apply 1 application topically 3 (three) times daily as needed for itching.  Dispense: 28.4 g; Refill: 0

## 2017-03-25 NOTE — Patient Instructions (Signed)
I want you to follow up with your primary care physician. You can use the steroid cream and benadryl cream. I would not use steriod cream for more than 7 days.

## 2017-03-30 ENCOUNTER — Ambulatory Visit (HOSPITAL_COMMUNITY)
Admission: EM | Admit: 2017-03-30 | Discharge: 2017-03-30 | Disposition: A | Discharge status: Home / Self Care | Payer: Self-pay | Attending: Family Medicine | Admitting: Family Medicine

## 2017-03-30 ENCOUNTER — Encounter (HOSPITAL_COMMUNITY): Payer: Self-pay | Admitting: Family Medicine

## 2017-03-30 DIAGNOSIS — R21 Rash and other nonspecific skin eruption: Secondary | ICD-10-CM

## 2017-03-30 DIAGNOSIS — L309 Dermatitis, unspecified: Secondary | ICD-10-CM

## 2017-03-30 MED ORDER — PERMETHRIN 5 % EX CREA: TOPICAL_CREAM | CUTANEOUS | 0 refills | Status: DC

## 2017-03-30 NOTE — Discharge Instructions (Addendum)
He may continue using the steroid cream prescribed to you by your PCP. Had Lotrisone cream twice a day to the red spots. Use the Elimite cream as directed today and in 7 days. Keep her appointment follow-up with your PCP next week.

## 2017-03-30 NOTE — ED Triage Notes (Addendum)
Pt here for rash to stomach area and under both arms with itchin. Reports since Saturday. She is worried about scabies.

## 2017-03-30 NOTE — ED Provider Notes (Signed)
[facility]    CSN: [id number] Arrival date & time: 03/30/17  1039     History   Chief Complaint Chief Complaint  Patient presents with  . Rash    HPI Carmen [name] is a 56 y.o. female.   56 year old obese female with complaints of a rash. This may have occurred 2 weeks or more earlier. These are roughly annular slightly irregularly shaped areas of erythema 2 spots to the left abdomen, one larger area to the right upper arm over the triceps was skin comes in contact with the chest wall and similar smaller lesion to the left arm. These are primarily erythematous papules. Some of these lesions appear to be different from others. There are streaky red spots to the left arm. The patient is convinced that she has scabies. No clear evidence of this at this time. She is currently using steroid cream which offer temporary itching relief that is not allowed the rash to heal. There are total of 4-5 areas/lesions as above.      Past Medical History:  Diagnosis Date  . Adhesive capsulitis of shoulder   . Allergy   . Borderline personality disorder (HCC)   . Carpal tunnel syndrome   . COPD (chronic obstructive pulmonary disease) (HCC)   . Depression   . GERD (gastroesophageal reflux disease)   . Hypertension     Patient Active Problem List   Diagnosis Date Noted  . Contact dermatitis 03/25/2017  . Chronic idiopathic constipation 01/05/2017  . Orthostatic hypotension 12/20/2016  . Vertigo 12/20/2016  . Anxiety 07/20/2016  . Hemorrhoids 11/27/2015  . Left medial knee pain 10/24/2015  . Back pain 02/27/2015  . Osteosclerosis 07/30/2014  . Numbness and tingling in left arm 07/01/2014  . Diabetes mellitus without complication (HCC) 11/15/2012  . Otomycosis 09/24/2011  . Metabolic syndrome 08/19/2011  . Carpal tunnel syndrome 06/10/2010  . Allergic rhinitis 11/18/2008  . Osteoarthritis of multiple joints 07/30/2008  . Adhesive capsulitis of shoulder 01/15/2008    . Obesity 07/25/2007  . GERD 09/20/2006  . DEPRESSION, MAJOR, RECURRENT 08/04/2006  . BORDERLINE PERSONALITY 08/04/2006  . HYPERTENSION, BENIGN SYSTEMIC 08/04/2006  . Irritable bowel syndrome 08/04/2006    Past Surgical History:  Procedure Laterality Date  . CHOLECYSTECTOMY    . TONSILLECTOMY      OB History    No data available       Home Medications    Prior to Admission medications   Medication Sig Start Date End Date Taking? Authorizing Provider  aspirin EC 81 MG tablet Take 81 mg by mouth daily.    [provider]  carvedilol (COREG) 6.25 MG tablet TAKE ONE TABLET BY MOUTH TWICE DAILY WITH FOOD 02/15/17   Reva Bores, MD  diphenhydrAMINE-zinc acetate (BENADRYL EXTRA STRENGTH) cream Apply 1 application topically 3 (three) times daily as needed for itching. 03/25/17   Mikell, Antionette Poles, MD  glimepiride (AMARYL) 4 MG tablet Take 1 tablet (4 mg total) by mouth daily before breakfast. Patient to finish supply of Glipizide then only take Glimepiride. 07/29/16   Moses Manners, MD  hydrochlorothiazide (MICROZIDE) 12.5 MG capsule TAKE ONE CAPSULE BY MOUTH ONE TIME DAILY 11/26/16   Reva Bores, MD  hydrocortisone 2.5 % cream Apply topically 2 (two) times daily. 03/25/17   Mikell, Antionette Poles, MD  ibuprofen (ADVIL,MOTRIN) 200 MG tablet Take 200 mg by mouth every 4 (four) hours as needed. Take with food     [provider]  losartan (COZAAR)  50 MG tablet Take 1 tablet (50 mg total) by mouth daily. 07/29/16   Moses Manners, MD  metFORMIN (GLUCOPHAGE-XR) 500 MG 24 hr tablet TAKE TWO TABLETS BY MOUTH ONCE DAILY WITH SUPPER 02/15/17   Reva Bores, MD  permethrin (ELIMITE) 5 % cream Apply from chin down, leave on for 8-14 hours, rinse. Repeat in 1 week 03/30/17   Hayden Rasmussen, NP  Probiotic Product (DIGESTIVE ADVANTAGE PO) Take by mouth daily.    [provider]  Pseudoephedrine HCl (NASAL DECONGESTANT PO) Take by mouth.    [provider]   triamcinolone (NASACORT ALLERGY 24HR) 55 MCG/ACT AERO nasal inhaler Place 2 sprays into the nose daily.    [provider]    Family History Family History  Problem Relation Age of Onset  . Diabetes Mother   . Hypertension Mother     Social History Social History  Substance Use Topics  . Smoking status: Former Smoker    Packs/day: 0.25    Years: 30.00    Types: Cigarettes    Quit date: 08/13/2007  . Smokeless tobacco: Never Used     Comment: boyfriend smokes  . Alcohol use No     Comment: rarely     Allergies   Ace inhibitors and Metformin and related   Review of Systems Review of Systems  Constitutional: Negative.   HENT: Negative.   Respiratory: Negative.   Skin: Positive for rash.  Neurological: Negative.   All other systems reviewed and are negative.    Physical Exam Triage Vital Signs ED Triage Vitals  Enc Vitals Group     BP      Pulse      Resp      Temp      Temp src      SpO2      Weight      Height      Head Circumference      Peak Flow      Pain Score      Pain Loc      Pain Edu?      Excl. in GC?    No data found.   Updated Vital Signs There were no vitals taken for this visit.  Visual Acuity Right Eye Distance:   Left Eye Distance:   Bilateral Distance:    Right Eye Near:   Left Eye Near:    Bilateral Near:     Physical Exam  Constitutional: She is oriented to person, place, and time. She appears well-developed and well-nourished. No distress.  Eyes: EOM are normal.  Neck: Normal range of motion. Neck supple.  Cardiovascular: Normal rate.   Pulmonary/Chest: Effort normal. No respiratory distress.  Musculoskeletal: She exhibits no edema.  Neurological: She is alert and oriented to person, place, and time. She exhibits normal muscle tone.  Skin: Skin is warm and dry.  Rash as per history of present illness. The rash and left arm looks fairly typical of poison ivy or poison oak contact as it occurs in a linear bumpy  fashion. The patient is convinced this is due to scabies and burrowing. There are 2 separate rough but flat lesions to the left abdomen measuring 3-4 cm in diameter. The color is uniform. No central clearing. In the rashes to the upper arm. To be more erythematous papular macular.  Psychiatric: She has a normal mood and affect.  Nursing note and vitals reviewed.    UC Treatments / Results  Labs (all labs ordered  are listed, but only abnormal results are displayed) Labs Reviewed - No data to display  EKG  EKG Interpretation None       Radiology No results found.  Procedures Procedures (including critical care time)  Medications Ordered in UC Medications - No data to display   Initial Impression / Assessment and Plan / UC Course  I have reviewed the triage vital signs and the nursing notes.  Pertinent labs & imaging results that were available during my care of the patient were reviewed by me and considered in my medical decision making (see chart for details).    He may continue using the steroid cream prescribed to you by your PCP. Had Lotrisone cream twice a day to the red spots. Use the Elimite cream as directed today and in 7 days. Keep her appointment follow-up with your PCP next week.  Patient is convinced that she is asked those to scabies. No convincing evidence of this however she insist on being treated. Elimite prescribed.    Final Clinical Impressions(s) / UC Diagnoses   Final diagnoses:  Rash and nonspecific skin eruption    New Prescriptions New Prescriptions   PERMETHRIN (ELIMITE) 5 % CREAM    Apply from chin down, leave on for 8-14 hours, rinse. Repeat in 1 week     Controlled Substance Prescriptions [place] Controlled Substance Registry consulted? Not Applicable   Hayden RasmussenMabe, Rashada Klontz, NP 03/30/17 1158

## 2017-03-30 NOTE — ED Provider Notes (Addendum)
error   Carmen [name], Carmen Dobratz, MD 03/30/17 1131

## 2017-03-31 ENCOUNTER — Other Ambulatory Visit: Payer: Self-pay | Admitting: Family Medicine

## 2017-03-31 DIAGNOSIS — E119 Type 2 diabetes mellitus without complications: Secondary | ICD-10-CM

## 2017-04-06 ENCOUNTER — Ambulatory Visit (INDEPENDENT_AMBULATORY_CARE_PROVIDER_SITE_OTHER): Payer: Self-pay | Admitting: Family Medicine

## 2017-04-06 ENCOUNTER — Encounter: Payer: Self-pay | Admitting: Family Medicine

## 2017-04-06 VITALS — BP 132/88 | HR 95 | Temp 98.3°F | Wt 222.0 lb

## 2017-04-06 DIAGNOSIS — L259 Unspecified contact dermatitis, unspecified cause: Secondary | ICD-10-CM

## 2017-04-06 MED ORDER — METHYLPREDNISOLONE 4 MG PO TBPK: ORAL_TABLET | ORAL | 0 refills | Status: DC

## 2017-04-06 NOTE — Assessment & Plan Note (Signed)
Likely this, will be able to afford Permethrin this week. Use this. Trial of dose-pak--should improve.

## 2017-04-06 NOTE — Progress Notes (Signed)
   Subjective:    Patient ID: Carmen [name] is a 56 y.o. female presenting with No chief complaint on file.  on 04/06/2017  HPI: Here today to f/u rash--convinced she has scabies. Given rx for permethrin, but could not afford it. Has been seen here and Urgent care. Has been treated and using steroid cream. Rash is improved. Notes some new lesions. Has possible poison oak exposure. Rash is itchy.  Review of Systems  Constitutional: Negative for chills and fever.  Respiratory: Negative for shortness of breath.   Cardiovascular: Negative for chest pain.  Gastrointestinal: Negative for abdominal pain, nausea and vomiting.  Genitourinary: Negative for dysuria.  Skin: Positive for rash.      Objective:    BP 132/88   Pulse 95   Temp 98.3 F (36.8 C) (Oral)   Wt 222 lb (100.7 kg)   SpO2 98%   BMI 40.60 kg/m  Physical Exam  Constitutional: She is oriented to person, place, and time. She appears well-developed and well-nourished. No distress.  HENT:  Head: Normocephalic and atraumatic.  Eyes: No scleral icterus.  Neck: Neck supple.  Cardiovascular: Normal rate.   Pulmonary/Chest: Effort normal.  Abdominal: Soft.  Neurological: She is alert and oriented to person, place, and time.  Skin: Skin is warm and dry. Rash noted.  Multiple plaques, macular,papular rash on arms, back, nipples  Psychiatric: She has a normal mood and affect.        Assessment & Plan:   Problem List Items Addressed This Visit      Unprioritized   Contact dermatitis - Primary    Likely this, will be able to afford Permethrin this week. Use this. Trial of dose-pak--should improve.       Relevant Medications   methylPREDNISolone (MEDROL DOSEPAK) 4 MG TBPK tablet      Total face-to-face time with patient: 15 minutes. Over 50% of encounter was spent on counseling and coordination of care. Return if symptoms worsen or fail to improve.  Reva Boresanya S Stephfon Bovey 04/06/2017 11:32 AM

## 2017-04-06 NOTE — Patient Instructions (Signed)
Contact Dermatitis Dermatitis is redness, soreness, and swelling (inflammation) of the skin. Contact dermatitis is a reaction to certain substances that touch the skin. You either touched something that irritated your skin, or you have allergies to something you touched. Follow these instructions at home: Skin Care  Moisturize your skin as needed.  Apply cool compresses to the affected areas.  Try taking a bath with: ? Epsom salts. Follow the instructions on the package. You can get these at a pharmacy or grocery store. ? Baking soda. Pour a small amount into the bath as told by your doctor. ? Colloidal oatmeal. Follow the instructions on the package. You can get this at a pharmacy or grocery store.  Try applying baking soda paste to your skin. Stir water into baking soda until it looks like paste.  Do not scratch your skin.  Bathe less often.  Bathe in lukewarm water. Avoid using hot water. Medicines  Take or apply over-the-counter and prescription medicines only as told by your doctor.  If you were prescribed an antibiotic medicine, take or apply your antibiotic as told by your doctor. Do not stop taking the antibiotic even if your condition starts to get better. General instructions  Keep all follow-up visits as told by your doctor. This is important.  Avoid the substance that caused your reaction. If you do not know what caused it, keep a journal to try to track what caused it. Write down: ? What you eat. ? What cosmetic products you use. ? What you drink. ? What you wear in the affected area. This includes jewelry.  If you were given a bandage (dressing), take care of it as told by your doctor. This includes when to change and remove it. Contact a doctor if:  You do not get better with treatment.  Your condition gets worse.  You have signs of infection such as: ? Swelling. ? Tenderness. ? Redness. ? Soreness. ? Warmth.  You have a fever.  You have new  symptoms. Get help right away if:  You have a very bad headache.  You have neck pain.  Your neck is stiff.  You throw up (vomit).  You feel very sleepy.  You see red streaks coming from the affected area.  Your bone or joint underneath the affected area becomes painful after the skin has healed.  The affected area turns darker.  You have trouble breathing. This information is not intended to replace advice given to you by your health care provider. Make sure you discuss any questions you have with your health care provider. Document Released: 03/21/2009 Document Revised: 10/30/2015 Document Reviewed: 10/09/2014 Elsevier Interactive Patient Education  2018 Elsevier Inc.  

## 2017-04-22 ENCOUNTER — Other Ambulatory Visit: Payer: Self-pay | Admitting: Family Medicine

## 2017-04-22 DIAGNOSIS — I10 Essential (primary) hypertension: Secondary | ICD-10-CM

## 2017-05-18 ENCOUNTER — Other Ambulatory Visit: Payer: Self-pay | Admitting: Family Medicine

## 2017-05-18 DIAGNOSIS — E119 Type 2 diabetes mellitus without complications: Secondary | ICD-10-CM

## 2017-06-02 ENCOUNTER — Other Ambulatory Visit: Payer: Self-pay | Admitting: *Deleted

## 2017-06-02 DIAGNOSIS — I10 Essential (primary) hypertension: Secondary | ICD-10-CM

## 2017-06-02 MED ORDER — HYDROCHLOROTHIAZIDE 12.5 MG PO CAPS: 12.5000 mg | ORAL_CAPSULE | Freq: Every day | ORAL | 3 refills | Status: DC

## 2017-06-02 NOTE — Telephone Encounter (Signed)
Pharmacy requesting refill for HCTZ.  Sent in to pharmacy. Jazmin Hartsell,CMA

## 2017-06-21 ENCOUNTER — Other Ambulatory Visit: Payer: Self-pay | Admitting: Family Medicine

## 2017-06-21 DIAGNOSIS — E119 Type 2 diabetes mellitus without complications: Secondary | ICD-10-CM

## 2017-06-21 DIAGNOSIS — I10 Essential (primary) hypertension: Secondary | ICD-10-CM

## 2017-06-22 ENCOUNTER — Ambulatory Visit: Payer: Self-pay

## 2017-06-22 ENCOUNTER — Other Ambulatory Visit: Payer: Self-pay | Admitting: Family Medicine

## 2017-06-22 NOTE — Telephone Encounter (Signed)
LM for patient that scripts were sent to pharmacy this morning from her PCP. Shadasia Oldfield,CMA

## 2017-06-22 NOTE — Telephone Encounter (Signed)
[facility] in [place], [phone] faxed a request for metformin, carvedilol for patient yesterday.  She needs these filled.  Please check on this for patient.  Her number is [phone] (ok to leave messge)

## 2017-07-16 ENCOUNTER — Other Ambulatory Visit: Payer: Self-pay | Admitting: Family Medicine

## 2017-07-16 DIAGNOSIS — I10 Essential (primary) hypertension: Secondary | ICD-10-CM

## 2017-07-21 ENCOUNTER — Other Ambulatory Visit: Payer: Self-pay | Admitting: Family Medicine

## 2017-07-21 DIAGNOSIS — E119 Type 2 diabetes mellitus without complications: Secondary | ICD-10-CM

## 2017-07-30 ENCOUNTER — Other Ambulatory Visit: Payer: Self-pay | Admitting: Family Medicine

## 2017-07-30 DIAGNOSIS — E119 Type 2 diabetes mellitus without complications: Secondary | ICD-10-CM

## 2017-08-25 ENCOUNTER — Other Ambulatory Visit: Payer: Self-pay | Admitting: Family Medicine

## 2017-08-25 DIAGNOSIS — I10 Essential (primary) hypertension: Secondary | ICD-10-CM

## 2017-09-15 ENCOUNTER — Other Ambulatory Visit: Payer: Self-pay | Admitting: Family Medicine

## 2017-09-15 DIAGNOSIS — E119 Type 2 diabetes mellitus without complications: Secondary | ICD-10-CM

## 2017-09-21 ENCOUNTER — Ambulatory Visit: Payer: Self-pay

## 2017-11-14 ENCOUNTER — Other Ambulatory Visit: Payer: Self-pay

## 2017-11-14 ENCOUNTER — Encounter: Payer: Self-pay | Admitting: Family Medicine

## 2017-11-14 ENCOUNTER — Ambulatory Visit (INDEPENDENT_AMBULATORY_CARE_PROVIDER_SITE_OTHER): Payer: Self-pay | Admitting: Family Medicine

## 2017-11-14 VITALS — BP 122/70 | HR 81 | Temp 98.4°F | Wt 222.0 lb

## 2017-11-14 DIAGNOSIS — E119 Type 2 diabetes mellitus without complications: Secondary | ICD-10-CM

## 2017-11-14 DIAGNOSIS — R0981 Nasal congestion: Secondary | ICD-10-CM

## 2017-11-14 LAB — POCT GLYCOSYLATED HEMOGLOBIN (HGB A1C): HbA1c, POC (controlled diabetic range): 8.7 % — AB (ref 0.0–7.0)

## 2017-11-14 NOTE — Progress Notes (Signed)
poct a  

## 2017-11-14 NOTE — Progress Notes (Signed)
    Subjective:  Carmen ComesFrances B [name] is a 57 y.o. female who [facility] with a chief complaint of congestion.   HPI:  Has been having nasal congestion and R ear fullness from the past 2 weeks. Rare occasional cough. No ear pain.  Got sick Saturday night with vomiting, thinks it might have been what she ate because it quickly resolved that same night and no vomiting or abdominal pain since. Has been using nascort which has been irritating to her nose. Also taking OTC decongestants phenylephrine but only when she absolutely needs it. Corcidin has not worked for her in the past. Has not tried antihistamines this time. This usually happens 2-3 times a year when the weather changes. No fever/chills.  No sore throat. No facial pressure. No purulent nasal discharge. No double worsening  ROS: Per HPI  Objective:  Physical Exam: BP 122/70   Pulse 81   Temp 98.4 F (36.9 C) (Oral)   Wt 222 lb (100.7 kg)   SpO2 98%   BMI 40.60 kg/m   Gen: NAD, resting comfortably HEENT: [place], AT. R ear canal occluded with wax initially but after lavage, TM is pearly without erythema or fluid or bulging. L ear canal with cerumen. No maxillary tenderness. Oropharynx nonerythematous. Nasal mucosa slight boggy without erythema. CV: RRR with no murmurs appreciated Pulm: NWOB, CTAB with no crackles, wheezes, or rhonchi GI: Normal bowel sounds present. Soft, Nontender, Nondistended. MSK: no edema, cyanosis, or clubbing noted Skin: warm, dry Neuro: grossly normal, moves all extremities Psych: Normal affect and thought content  Assessment/Plan:  1. Nasal congestion No signs or symptoms of bacterial infection. DDx include viral given the vomiting on Saturday or environmental allergies given the trigger with change in weather. R ear fullness resolved after cerumen removal so likely a separate issue. Advised trial of antihistamine and supportive care at home. Discussed RTC if not improved in 1 week, at  which patient stated that she had appt scheduled with PCP already.    Leland HerElsia J Yoo, DO PGY-2, [facility] 11/14/2017 2:28 PM

## 2017-11-14 NOTE — Patient Instructions (Addendum)
Please try antihistamine like claritin or zyrtec.  I hope you will feel better now that we have opened up your ear.   Keep doing the Nasacort   Come back and see your doctor as planned

## 2017-11-15 ENCOUNTER — Ambulatory Visit: Payer: Self-pay | Admitting: Family Medicine

## 2017-11-24 ENCOUNTER — Other Ambulatory Visit: Payer: Self-pay

## 2017-11-24 ENCOUNTER — Encounter: Payer: Self-pay | Admitting: Family Medicine

## 2017-11-24 ENCOUNTER — Ambulatory Visit (INDEPENDENT_AMBULATORY_CARE_PROVIDER_SITE_OTHER): Payer: Self-pay | Admitting: Family Medicine

## 2017-11-24 VITALS — BP 128/70 | HR 96 | Temp 98.6°F | Ht 62.0 in | Wt 223.0 lb

## 2017-11-24 DIAGNOSIS — I10 Essential (primary) hypertension: Secondary | ICD-10-CM

## 2017-11-24 DIAGNOSIS — M159 Polyosteoarthritis, unspecified: Secondary | ICD-10-CM

## 2017-11-24 DIAGNOSIS — E119 Type 2 diabetes mellitus without complications: Secondary | ICD-10-CM

## 2017-11-24 DIAGNOSIS — M150 Primary generalized (osteo)arthritis: Secondary | ICD-10-CM

## 2017-11-24 MED ORDER — CELECOXIB 200 MG PO CAPS: 200.0000 mg | ORAL_CAPSULE | Freq: Two times a day (BID) | ORAL | 2 refills | Status: DC

## 2017-11-24 NOTE — Assessment & Plan Note (Signed)
Trial of celebrex

## 2017-11-24 NOTE — Assessment & Plan Note (Addendum)
Poorly controlled. Continue metformin and amaryl. Declines further medication, specifically declines any injection. Her lack of insurance makes this difficult. She states she wants to eat whatever she wants and just take her meds. She has not been tolerant of many other oral agents.

## 2017-11-24 NOTE — Assessment & Plan Note (Addendum)
Well controlled, continue carvedilol, Cozaar, May try to come off her HCTZ if possible--come by for BP checks periodically.

## 2017-11-24 NOTE — Patient Instructions (Signed)
Diabetes Mellitus and Nutrition When you have diabetes (diabetes mellitus), it is very important to have healthy eating habits because your blood sugar (glucose) levels are greatly affected by what you eat and drink. Eating healthy foods in the appropriate amounts, at about the same times every day, can help you:  Control your blood glucose.  Lower your risk of heart disease.  Improve your blood pressure.  Reach or maintain a healthy weight.  Every person with diabetes is different, and each person has different needs for a meal plan. Your health care provider may recommend that you work with a diet and nutrition specialist (dietitian) to make a meal plan that is best for you. Your meal plan may vary depending on factors such as:  The calories you need.  The medicines you take.  Your weight.  Your blood glucose, blood pressure, and cholesterol levels.  Your activity level.  Other health conditions you have, such as heart or kidney disease.  How do carbohydrates affect me? Carbohydrates affect your blood glucose level more than any other type of food. Eating carbohydrates naturally increases the amount of glucose in your blood. Carbohydrate counting is a method for keeping track of how many carbohydrates you eat. Counting carbohydrates is important to keep your blood glucose at a healthy level, especially if you use insulin or take certain oral diabetes medicines. It is important to know how many carbohydrates you can safely have in each meal. This is different for every person. Your dietitian can help you calculate how many carbohydrates you should have at each meal and for snack. Foods that contain carbohydrates include:  Bread, cereal, rice, pasta, and crackers.  Potatoes and corn.  Peas, beans, and lentils.  Milk and yogurt.  Fruit and juice.  Desserts, such as cakes, cookies, ice cream, and candy.  How does alcohol affect me? Alcohol can cause a sudden decrease in blood  glucose (hypoglycemia), especially if you use insulin or take certain oral diabetes medicines. Hypoglycemia can be a life-threatening condition. Symptoms of hypoglycemia (sleepiness, dizziness, and confusion) are similar to symptoms of having too much alcohol. If your health care provider says that alcohol is safe for you, follow these guidelines:  Limit alcohol intake to no more than 1 drink per day for nonpregnant women and 2 drinks per day for men. One drink equals 12 oz of beer, 5 oz of wine, or 1 oz of hard liquor.  Do not drink on an empty stomach.  Keep yourself hydrated with water, diet soda, or unsweetened iced tea.  Keep in mind that regular soda, juice, and other mixers may contain a lot of sugar and must be counted as carbohydrates.  What are tips for following this plan? Reading food labels  Start by checking the serving size on the label. The amount of calories, carbohydrates, fats, and other nutrients listed on the label are based on one serving of the food. Many foods contain more than one serving per package.  Check the total grams (g) of carbohydrates in one serving. You can calculate the number of servings of carbohydrates in one serving by dividing the total carbohydrates by 15. For example, if a food has 30 g of total carbohydrates, it would be equal to 2 servings of carbohydrates.  Check the number of grams (g) of saturated and trans fats in one serving. Choose foods that have low or no amount of these fats.  Check the number of milligrams (mg) of sodium in one serving. Most people   should limit total sodium intake to less than 2,300 mg per day.  Always check the nutrition information of foods labeled as "low-fat" or "nonfat". These foods may be higher in added sugar or refined carbohydrates and should be avoided.  Talk to your dietitian to identify your daily goals for nutrients listed on the label. Shopping  Avoid buying canned, premade, or processed foods. These  foods tend to be high in fat, sodium, and added sugar.  Shop around the outside edge of the grocery store. This includes fresh fruits and vegetables, bulk grains, fresh meats, and fresh dairy. Cooking  Use low-heat cooking methods, such as baking, instead of high-heat cooking methods like deep frying.  Cook using healthy oils, such as olive, canola, or sunflower oil.  Avoid cooking with butter, cream, or high-fat meats. Meal planning  Eat meals and snacks regularly, preferably at the same times every day. Avoid going long periods of time without eating.  Eat foods high in fiber, such as fresh fruits, vegetables, beans, and whole grains. Talk to your dietitian about how many servings of carbohydrates you can eat at each meal.  Eat 4-6 ounces of lean protein each day, such as lean meat, chicken, fish, eggs, or tofu. 1 ounce is equal to 1 ounce of meat, chicken, or fish, 1 egg, or 1/4 cup of tofu.  Eat some foods each day that contain healthy fats, such as avocado, nuts, seeds, and fish. Lifestyle   Check your blood glucose regularly.  Exercise at least 30 minutes 5 or more days each week, or as told by your health care provider.  Take medicines as told by your health care provider.  Do not use any products that contain nicotine or tobacco, such as cigarettes and e-cigarettes. If you need help quitting, ask your health care provider.  Work with a counselor or diabetes educator to identify strategies to manage stress and any emotional and social challenges. What are some questions to ask my health care provider?  Do I need to meet with a diabetes educator?  Do I need to meet with a dietitian?  What number can I call if I have questions?  When are the best times to check my blood glucose? Where to find more information:  American Diabetes Association: diabetes.org/food-and-fitness/food  Academy of Nutrition and Dietetics:  www.eatright.org/resources/health/diseases-and-conditions/diabetes  National Institute of Diabetes and Digestive and Kidney Diseases (NIH): www.niddk.nih.gov/health-information/diabetes/overview/diet-eating-physical-activity Summary  A healthy meal plan will help you control your blood glucose and maintain a healthy lifestyle.  Working with a diet and nutrition specialist (dietitian) can help you make a meal plan that is best for you.  Keep in mind that carbohydrates and alcohol have immediate effects on your blood glucose levels. It is important to count carbohydrates and to use alcohol carefully. This information is not intended to replace advice given to you by your health care provider. Make sure you discuss any questions you have with your health care provider. Document Released: 02/18/2005 Document Revised: 06/28/2016 Document Reviewed: 06/28/2016 Elsevier Interactive Patient Education  2018 Elsevier Inc.  

## 2017-11-24 NOTE — Progress Notes (Signed)
   Subjective:    Patient ID: Carmen [name] is a 57 y.o. female presenting with Diabetes and Hypertension  on 11/24/2017  HPI: Going to the tanning bed. Picking at scabs. Joined a gym in February. Going 3x/wk. Does the treadmill and bike. Works out x 1-4 hours. Goes with co-worker and Radiation protection practitionerhannon. Has felt sick for last 1 month and noted she has some allergy symptoms, which come in spring. Still having some symptoms. Taking Zyrtec and Flonase. Having pain, which might be related to arthritis. Has pain on right hip, low back, neck and spine. Has to turn sideways to get up stairs. Her right shoulder is frozen and is giving her more pain.  Review of Systems  Constitutional: Negative for chills and fever.  Respiratory: Negative for shortness of breath.   Cardiovascular: Negative for chest pain.  Gastrointestinal: Negative for abdominal pain, nausea and vomiting.  Genitourinary: Negative for dysuria.  Skin: Negative for rash.      Objective:    BP 128/70   Pulse 96   Temp 98.6 F (37 C) (Oral)   Ht 5\' 2"  (1.575 m)   Wt 223 lb (101.2 kg)   SpO2 97%   BMI 40.79 kg/m  Physical Exam  Constitutional: She is oriented to person, place, and time. She appears well-developed and well-nourished. No distress.  HENT:  Head: Normocephalic and atraumatic.  Eyes: No scleral icterus.  Neck: Neck supple.  Cardiovascular: Normal rate.  Pulmonary/Chest: Effort normal.  Abdominal: Soft.  Neurological: She is alert and oriented to person, place, and time.  Skin: Skin is warm and dry.  Psychiatric: She has a normal mood and affect.        Assessment & Plan:   Problem List Items Addressed This Visit      High   HYPERTENSION, BENIGN SYSTEMIC (Chronic)    Well controlled, continue carvedilol, Cozaar, May try to come off her HCTZ if possible--come by for BP checks periodically.      Diabetes mellitus without complication (HCC)    Poorly controlled. Continue metformin and amaryl. Declines  further medication, specifically declines any injection. Her lack of insurance makes this difficult. She states she wants to eat whatever she wants and just take her meds. She has not been tolerant of many other oral agents.        Unprioritized   Osteoarthritis of multiple joints - Primary    Trial of celebrex      Relevant Medications   celecoxib (CELEBREX) 200 MG capsule      Patient declines routine HM studies, including pneumonia vaccines, mammogram and colon cancer screening. Total face-to-face time with patient: 15 minutes. Over 50% of encounter was spent on counseling and coordination of care. Return in about 3 months (around 02/24/2018).  Reva Boresanya S Pratt 11/24/2017 4:01 PM

## 2017-11-26 ENCOUNTER — Encounter: Payer: Self-pay | Admitting: Family Medicine

## 2017-12-19 ENCOUNTER — Encounter: Payer: Self-pay | Admitting: Family Medicine

## 2017-12-19 ENCOUNTER — Other Ambulatory Visit: Payer: Self-pay

## 2017-12-19 ENCOUNTER — Ambulatory Visit (INDEPENDENT_AMBULATORY_CARE_PROVIDER_SITE_OTHER): Payer: Self-pay | Admitting: Family Medicine

## 2017-12-19 VITALS — BP 148/90 | HR 80 | Ht 62.0 in | Wt 229.0 lb

## 2017-12-19 DIAGNOSIS — M7501 Adhesive capsulitis of right shoulder: Secondary | ICD-10-CM

## 2017-12-19 DIAGNOSIS — M150 Primary generalized (osteo)arthritis: Secondary | ICD-10-CM

## 2017-12-19 DIAGNOSIS — M159 Polyosteoarthritis, unspecified: Secondary | ICD-10-CM

## 2017-12-19 MED ORDER — MELOXICAM 15 MG PO TABS: 15.0000 mg | ORAL_TABLET | Freq: Every day | ORAL | 1 refills | Status: DC

## 2017-12-19 NOTE — Patient Instructions (Addendum)
Keep up the good work with exercising- that's the best thing you can do for your joints and to help with weight loss.   Try incorporating pool exercises while the pool is open this summer.   Let's try the mobic, one pill once daily. Please follow up with Dr. Shawnie PonsPratt.   If you have questions or concerns please do not hesitate to call at [phone].  Dolores PattyAngela Sahib Pella, DO PGY-2, [facility] 12/19/2017 3:16 PM   Shoulder Range of Motion Exercises Shoulder range of motion (ROM) exercises are designed to keep the shoulder moving freely. They are often recommended for people who have shoulder pain. Phase 1 exercises When you are able, do this exercise 5-6 days per week, or as told by your health care provider. Work toward doing 2 sets of 10 swings. Pendulum Exercise How To Do This Exercise Lying Down 1. Lie face-down on a bed with your abdomen close to the side of the bed. 2. Let your arm hang over the side of the bed. 3. Relax your shoulder, arm, and hand. 4. Slowly and gently swing your arm forward and back. Do not use your neck muscles to swing your arm. They should be relaxed. If you are struggling to swing your arm, have someone gently swing it for you. When you do this exercise for the first time, swing your arm at a 15 degree angle for 15 seconds, or swing your arm 10 times. As pain lessens over time, increase the angle of the swing to 30-45 degrees. 5. Repeat steps 1-4 with the other arm.  How To Do This Exercise While Standing 1. Stand next to a sturdy chair or table and hold on to it with your hand. 1. Bend forward at the waist. 2. Bend your knees slightly. 3. Relax your other arm and let it hang limp. 4. Relax the shoulder blade of the arm that is hanging and let it drop. 5. While keeping your shoulder relaxed, use body motion to swing your arm in small circles. The first time you do this exercise, swing your arm for about 30 seconds or 10 times. When you do it next  time, swing your arm for a little longer. 6. Stand up tall and relax. 7. Repeat steps 1-7, this time changing the direction of the circles. 2. Repeat steps 1-8 with the other arm.  Phase 2 exercises Do these exercises 3-4 times per day on 5-6 days per week or as told by your health care provider. Work toward holding the stretch for 20 seconds. Stretching Exercise 1 1. Lift your arm straight out in front of you. 2. Bend your arm 90 degrees at the elbow (right angle) so your forearm goes across your body and looks like the letter "L." 3. Use your other arm to gently pull the elbow forward and across your body. 4. Repeat steps 1-3 with the other arm. Stretching Exercise 2 You will need a towel or rope for this exercise. 1. Bend one arm behind your back with the palm facing outward. 2. Hold a towel with your other hand. 3. Reach the arm that holds the towel above your head, and bend that arm at the elbow. Your wrist should be behind your neck. 4. Use your free hand to grab the free end of the towel. 5. With the higher hand, gently pull the towel up behind you. 6. With the lower hand, pull the towel down behind you. 7. Repeat steps 1-6 with the other arm.  Phase 3 exercises Do each of these exercises at four different times of day (sessions) every day or as told by your health care provider. To begin with, repeat each exercise 5 times (repetitions). Work toward doing 3 sets of 12 repetitions or as told by your health care provider. Strengthening Exercise 1 You will need a light weight for this activity. As you grow stronger, you may use a heavier weight. 1. Standing with a weight in your hand, lift your arm straight out to the side until it is at the same height as your shoulder. 2. Bend your arm at 90 degrees so that your fingers are pointing to the ceiling. 3. Slowly raise your hand until your arm is straight up in the air. 4. Repeat steps 1-3 with the other arm.  Strengthening Exercise  2 You will need a light weight for this activity. As you grow stronger, you may use a heavier weight. 1. Standing with a weight in your hand, gradually move your straight arm in an arc, starting at your side, then out in front of you, then straight up over your head. 2. Gradually move your other arm in an arc, starting at your side, then out in front of you, then straight up over your head. 3. Repeat steps 1-2 with the other arm.  Strengthening Exercise 3 You will need an elastic band for this activity. As you grow stronger, gradually increase the size of the bands or increase the number of bands that you use at one time. 1. While standing, hold an elastic band in one hand and raise that arm up in the air. 2. With your other hand, pull down the band until that hand is by your side. 3. Repeat steps 1-2 with the other arm.  This information is not intended to replace advice given to you by your health care provider. Make sure you discuss any questions you have with your health care provider. Document Released: 02/20/2003 Document Revised: 01/18/2016 Document Reviewed: 05/20/2014 Elsevier Interactive Patient Education  [employer].

## 2017-12-19 NOTE — Progress Notes (Signed)
    Subjective:    Patient ID: Carmen [name], female    DOB: 05/21/1961, 57 y.o.   MRN: [id number]   CC: "in pain bad"  HPI: was rx celebrex at last visit, she got the medication and has not noted a difference so far. She has stopped taking it. She was taking it as needed, not twice a day as it was prescribed.  Frozen shoulder on right, failed outpatient PT in the past. Diffuse joint pains, knees, back, ankles, hands, feet. Feels she has ankylosing spondylitis because she researched that on the internet and it fits her symptoms.   Last imaging of spine in 2016 shows degenerative changes in lumbar spine progressed from prior imaging.   She is trying CBG oil without relief. Started using "hemp freeze" topical rub with some relief temporarily. Ibuprofen helps some.   She has joined a gym as of February and was initially going often but then became "depressed about life situations" and stopped going as much. She works several part time jobs and has difficulty performing tasks due to pain. She has complicated social situation with 5 grandchildren in foster care that she worries about.  Smoking status reviewed- former smoker  Review of Systems- denies weight loss, night sweats, rash, joint swelling, nail changes, hair loss, SOB, chest pains.   Objective:  BP (!) 148/90   Pulse 80   Ht 5\' 2"  (1.575 m)   Wt 229 lb (103.9 kg)   SpO2 98%   BMI 41.88 kg/m  Vitals and nursing note reviewed  General: obese woman, appears older than stated age, in no acute distress HEENT: normocephalic, MMM Neck: supple, non-tender, without lymphadenopathy Cardiac: RRR, clear S1 and S2, no murmurs, rubs, or gallops Respiratory: clear to auscultation bilaterally, no increased work of breathing Extremities: no edema or cyanosis. No joint deformities noted. Limited ROM of right shoulder. Normal ROM in left shoulder.  Skin: warm and dry, no rashes noted Neuro: alert and oriented, no focal  deficits   Assessment & Plan:   1. Primary osteoarthritis involving multiple joints Discussed her symptoms are likely due to known OA of her knees, spine. Discussed ankylosing spondylitis is likely not causing her issues and I would not initiate work up for this. Her imaging does not give any indication of this, also AS more commonly affects young men. Encouraged her to continue with exercising and weight loss as this will ultimately be the best treatment for her pains. Will give short course of mobic to take once daily as needed for her pains as she seems to have a good response to antiinflammatories. She may benefit from addition of SSRI or SNRI in the future. She is also interested in counseling due to life stressors. [facility].   2. Adhesive capsulitis of right shoulder Reviewed ROM exercises with patient and encouraged her to do these daily as much as she could tolerate. Can use mobic as above for pain.    Return if symptoms worsen or fail to improve.   Dolores PattyAngela Xiong Haidar, DO Family Medicine Resident PGY-2

## 2017-12-28 ENCOUNTER — Ambulatory Visit: Payer: Self-pay

## 2018-01-04 ENCOUNTER — Ambulatory Visit: Payer: Self-pay

## 2018-04-21 ENCOUNTER — Other Ambulatory Visit: Payer: Self-pay | Admitting: Family Medicine

## 2018-04-21 DIAGNOSIS — E119 Type 2 diabetes mellitus without complications: Secondary | ICD-10-CM

## 2018-07-19 ENCOUNTER — Other Ambulatory Visit: Payer: Self-pay

## 2018-07-19 DIAGNOSIS — I10 Essential (primary) hypertension: Secondary | ICD-10-CM

## 2018-07-19 DIAGNOSIS — E119 Type 2 diabetes mellitus without complications: Secondary | ICD-10-CM

## 2018-07-19 MED ORDER — HYDROCHLOROTHIAZIDE 12.5 MG PO CAPS
12.5000 mg | ORAL_CAPSULE | Freq: Every day | ORAL | 3 refills | Status: DC
Start: 2018-07-19 — End: 2019-02-01

## 2018-07-20 ENCOUNTER — Encounter: Payer: Self-pay | Admitting: Family Medicine

## 2018-07-20 ENCOUNTER — Telehealth: Payer: Self-pay

## 2018-07-20 ENCOUNTER — Other Ambulatory Visit: Payer: Self-pay

## 2018-07-20 ENCOUNTER — Ambulatory Visit (INDEPENDENT_AMBULATORY_CARE_PROVIDER_SITE_OTHER): Payer: Self-pay | Admitting: Family Medicine

## 2018-07-20 VITALS — BP 135/80 | HR 92 | Temp 98.4°F | Wt 218.0 lb

## 2018-07-20 DIAGNOSIS — I10 Essential (primary) hypertension: Secondary | ICD-10-CM

## 2018-07-20 DIAGNOSIS — J069 Acute upper respiratory infection, unspecified: Secondary | ICD-10-CM

## 2018-07-20 DIAGNOSIS — R6889 Other general symptoms and signs: Secondary | ICD-10-CM

## 2018-07-20 DIAGNOSIS — Z20828 Contact with and (suspected) exposure to other viral communicable diseases: Secondary | ICD-10-CM

## 2018-07-20 LAB — POCT INFLUENZA A/B
INFLUENZA A, POC: NEGATIVE
INFLUENZA B, POC: NEGATIVE

## 2018-07-20 MED ORDER — BENZONATATE 100 MG PO CAPS: 100.0000 mg | ORAL_CAPSULE | Freq: Two times a day (BID) | ORAL | 0 refills | Status: DC | PRN

## 2018-07-20 MED ORDER — METFORMIN HCL ER 500 MG PO TB24: ORAL_TABLET | ORAL | 5 refills | Status: DC

## 2018-07-20 MED ORDER — OSELTAMIVIR PHOSPHATE 75 MG PO CAPS: 75.0000 mg | ORAL_CAPSULE | Freq: Two times a day (BID) | ORAL | 0 refills | Status: DC

## 2018-07-20 NOTE — Patient Instructions (Addendum)
  Go to the lab today  Honey for your cough  Humidifier in your room   Your flu test was negative    Please seek attention in the Emergency Department if you develop fevers, inability to tolerate fluids, or difficulty taking your medicatiosn    It was wonderful to see you today.  Thank you for choosing [facility].   Please call [phone] with any questions about today's appointment.  Please be sure to schedule follow up at the front  desk before you leave today.   Terisa Starr, MD  Family Medicine

## 2018-07-20 NOTE — Telephone Encounter (Signed)
[facility] calling [facility] for [facility].  Ples SpecterAlisa Gabbrielle Mcnicholas, RN [facility] RN)

## 2018-07-20 NOTE — Progress Notes (Signed)
  Patient Name: Carmen [name] Date of Birth: [birth year] Date of Visit: 07/20/18 PCP: Reva Bores, MD  Chief Complaint: cough, congestion   Subjective: Carmen [name] is a pleasant 58 y.o. year old with history of tobacco abuse (quit 11 years ago), HTN, type 2 diabetes, and osteoarthritis presenting for cough and congestion.   The patient visited her boyfriend earlier this week, who was diagnosed with influenza. She spent 2 days at his house while he was ill. She reports 18 hours of cough, congestion, and sinus pressure. No fevers, headache, myalgias, chest pain, difficulty breathing, palpitations, nausea, or diarrhea. She had emesis after eating a sweet cake and cheeseburger pie. Eating and drinking well now. No further vomiting. She has been taking OTC decongestants round the clock.   The patient has a history of diabetes (last A1C 8.7). She has not recently had her kidney function checked- regularly takes ibuprofen and HCTZ.    ROS:  ROS Negative for fevers, chest pain, dyspnea, wheezing.   I have reviewed the patient's medical, surgical, family, and social history as appropriate.   Vitals:   07/20/18 1049  BP: 135/80  Pulse: 92  Temp: 98.4 F (36.9 C)  SpO2: 98%   Filed Weights   07/20/18 1049  Weight: 218 lb (98.9 kg)   HEENT: Sclera anicteric. Dentition is moderate. Appears well hydrated. Neck: Supple Cardiac: Regular rate and rhythm. Normal S1/S2. No murmurs, rubs, or gallops appreciated. Lungs: Good air entry, no wheezes, rales, or rhonchi.  Abdomen: Normoactive bowel sounds. No tenderness to deep or light palpation. No rebound or guarding.  Extremities: Warm, well perfused without edema.  Skin: Warm, dry Psych: Pleasant and appropriate   Jimia was seen today for cough.  Diagnoses and all orders for this visit:  Flu-like symptoms -     Influenza A/B- negative   HYPERTENSION, BENIGN SYSTEMIC, due for labs as has not had creatinine since 2017.  -      Basic Metabolic Panel  Exposure to influenza -     oseltamivir (TAMIFLU) 75 MG capsule; Take 1 capsule (75 mg total) by mouth 2 (two) times daily.  Viral URI, one day of symptoms, likely alternative virus, flu negative. Given co morbidities with prophylax per CDC guidelines (Diabetes which is uncontrolled). Strict return precautions were given. Discussed appropriate care for viral illness includinghumidified air, honey, drinking fluids, Tylenol, and Motrin.   Terisa Starr, MD  Family Medicine Teaching Service

## 2018-07-20 NOTE — Telephone Encounter (Signed)
Called and LM that Rx was sent in - hopefully to the correct pharmacy.

## 2018-07-21 LAB — BASIC METABOLIC PANEL WITH GFR
BUN/Creatinine Ratio: 25 — ABNORMAL HIGH (ref 9–23)
BUN: 14 mg/dL (ref 6–24)
CO2: 24 mmol/L (ref 20–29)
Calcium: 9 mg/dL (ref 8.7–10.2)
Chloride: 95 mmol/L — ABNORMAL LOW (ref 96–106)
Creatinine, Ser: 0.55 mg/dL — ABNORMAL LOW (ref 0.57–1.00)
GFR calc Af Amer: 121 mL/min/1.73
GFR calc non Af Amer: 105 mL/min/1.73
Glucose: 298 mg/dL — ABNORMAL HIGH (ref 65–99)
Potassium: 4.1 mmol/L (ref 3.5–5.2)
Sodium: 138 mmol/L (ref 134–144)

## 2018-07-24 ENCOUNTER — Telehealth: Payer: Self-pay | Admitting: Family Medicine

## 2018-07-24 DIAGNOSIS — E119 Type 2 diabetes mellitus without complications: Secondary | ICD-10-CM

## 2018-07-24 MED ORDER — METFORMIN HCL ER 500 MG PO TB24: ORAL_TABLET | ORAL | 5 refills | Status: DC

## 2018-07-24 NOTE — Telephone Encounter (Signed)
Called patient with results. Hyperglycemia noted.  A1C >10. Discussed adding medication vs. Increasing metformin. Patient elected to increase metformin to 1,000 mg BID (GFR will tolerate).   Terisa Starr, MD  Family Medicine Teaching Service

## 2018-07-27 ENCOUNTER — Telehealth: Payer: Self-pay

## 2018-07-27 ENCOUNTER — Encounter: Payer: Self-pay | Admitting: Family Medicine

## 2018-07-27 NOTE — Telephone Encounter (Signed)
Letter in letters tab to print and fax.   Terisa Starr, MD  Family Medicine Teaching Service

## 2018-07-27 NOTE — Telephone Encounter (Signed)
Attempted to call patient. Left generic message for patient to call back. Given patient's persistent symptoms, I recommend she be seen again for evaluation and possible investigation of additional causes of her symptoms given her influenza test was negative.  Terisa Starr, MD  Family Medicine Teaching Service

## 2018-07-27 NOTE — Telephone Encounter (Signed)
Patient left message she has been out of work x one week with the flu. Wants provider to write a note excusing her and fax to her employer.  Her call back is [phone]  Ples Specter, RN [facility] RN)

## 2018-07-27 NOTE — Telephone Encounter (Signed)
Printed letter and called patient to get fax number to fulfill her request.  Patient states that she is still sick and vomiting and that she will not be able to go back to  work until Friday, 08/04/2018  Letter needs to excuse her until then.    Fax number for job is [phone].  Marland KitchenGlennie Hawk, CMA

## 2018-07-27 NOTE — Progress Notes (Signed)
Letter in letters tabs. Access team, please print and fax as indicated in RN note.  Thanks! Terisa Starr, MD  Family Medicine Teaching Service

## 2018-07-28 ENCOUNTER — Encounter: Payer: Self-pay | Admitting: Family Medicine

## 2018-07-28 ENCOUNTER — Ambulatory Visit (INDEPENDENT_AMBULATORY_CARE_PROVIDER_SITE_OTHER): Payer: Self-pay | Admitting: Family Medicine

## 2018-07-28 VITALS — BP 130/80 | HR 112 | Temp 98.2°F | Wt 208.6 lb

## 2018-07-28 DIAGNOSIS — R112 Nausea with vomiting, unspecified: Secondary | ICD-10-CM

## 2018-07-28 NOTE — Progress Notes (Signed)
  Patient Name: [name] Date of Birth: 08/22/1960 Date of Visit: 07/28/18 PCP: Reva Bores, MD  Chief Complaint: Emesis for 3 days  Subjective: Carmen [name] is a pleasant 58 y.o. woman with history of type 2 diabetes, hypertension, constipation, tobacco abuse, and insomnia presenting today with emesis.  The patient was seen last week for a viral syndrome.  She had exposure to influenza from her significant other at that time.  She reports her cough and congestion have improved.   Over the last week she is continued to feel fatigued.  She has been unable to go to work she reports because she is having small volume of emesis once or twice per day.  She denies abdominal pain, constant nausea, inability to tolerate foods or fluids.  This week she reports she has been drinking water, diet ginger ale and Diet Coke.  She thinks what may have triggered her symptoms was that she had the grease from a pot roast.  She reports she can eat a whole pot roast and thinks this may have been the cause of her initial episodes of emesis.  She did not pick up the Tamiflu as it was expensive.  Her cough is improved.She denies fevers,  dyspnea or chest pain.   ROS: Negative except for as above ROS  I have reviewed the patient's medical, surgical, family, and social history as appropriate.   Vitals:   07/28/18 1041  BP: 130/80  Pulse: (!) 112  Temp: 98.2 F (36.8 C)  SpO2: 97%   Filed Weights   07/28/18 1041  Weight: 208 lb 9.6 oz (94.6 kg)    HR repeated 92 on exam  HEENT: Sclera anicteric. Dentition is moderate. Appears well hydrated. Neck: Supple Cardiac: Regular rate and rhythm. Normal S1/S2. No murmurs, rubs, or gallops appreciated. Lungs: Coarse bilateral breath sounds Abdomen: Normoactive bowel sounds. No tenderness to deep or light palpation. No rebound or guarding.  Extremities: Warm, well perfused without edema.  Skin: Warm, dry Psych: Pleasant and appropriate   [name]  was seen today for emesis and fatigue.  Most likely this is related to a viral illness or possibly gastroenteritis due to her food intake.  Differential does include hyperglycemia and resulting acidosis from this.  As such we will get a metabolic panel today.  Reviewed strict return precautions with patient and it was given for the patient for her work.  Diagnoses and all orders for this visit:  Non-intractable vomiting with nausea, unspecified vomiting type -     Comprehensive metabolic panel    Terisa Starr, MD  Family Medicine Teaching Service

## 2018-07-28 NOTE — Patient Instructions (Signed)
   Go to the lab today  It was wonderful to see you today.  Thank you for choosing [facility].   Please call [phone] with any questions about today's appointment.  Please be sure to schedule follow up at the front  desk before you leave today.   Terisa Starr, MD  Family Medicine

## 2018-07-29 ENCOUNTER — Telehealth: Payer: Self-pay | Admitting: Family Medicine

## 2018-07-29 ENCOUNTER — Other Ambulatory Visit: Payer: Self-pay | Admitting: Family Medicine

## 2018-07-29 DIAGNOSIS — N179 Acute kidney failure, unspecified: Secondary | ICD-10-CM

## 2018-07-29 LAB — COMPREHENSIVE METABOLIC PANEL
ALT: 29 IU/L (ref 0–32)
AST: 29 IU/L (ref 0–40)
Albumin/Globulin Ratio: 1.8 (ref 1.2–2.2)
Albumin: 4.5 g/dL (ref 3.8–4.9)
Alkaline Phosphatase: 69 IU/L (ref 39–117)
BUN/Creatinine Ratio: 22 (ref 9–23)
BUN: 27 mg/dL — ABNORMAL HIGH (ref 6–24)
Bilirubin Total: 0.9 mg/dL (ref 0.0–1.2)
CO2: 27 mmol/L (ref 20–29)
Calcium: 9.1 mg/dL (ref 8.7–10.2)
Chloride: 89 mmol/L — ABNORMAL LOW (ref 96–106)
Creatinine, Ser: 1.22 mg/dL — ABNORMAL HIGH (ref 0.57–1.00)
GFR calc Af Amer: 57 mL/min/{1.73_m2} — ABNORMAL LOW (ref >59)
GFR calc non Af Amer: 50 mL/min/{1.73_m2} — ABNORMAL LOW (ref >59)
GLOBULIN, TOTAL: 2.5 g/dL (ref 1.5–4.5)
Glucose: 271 mg/dL — ABNORMAL HIGH (ref 65–99)
Potassium: 3.2 mmol/L — ABNORMAL LOW (ref 3.5–5.2)
Sodium: 138 mmol/L (ref 134–144)
Total Protein: 7 g/dL (ref 6.0–8.5)

## 2018-07-29 NOTE — Telephone Encounter (Signed)
Called patient regarding metabolic panel that was drawn for concerns for persistent hyperglycemia and mild dehydration in the office.  She has a mild acute kidney injury and hypokalemia.  She has been taking ibuprofen, losartan and HCTZ.  I recommended she stop NSAIDS (discussed at length), HCTZ, and losartan.  Her blood pressure has been very well controlled.  She is feeling better today.  She has not vomited.  She is drinking well.  She is back to eating her usual amount.  We will hold these 3 medications.  She is to return to the office on Tuesday for repeat metabolic panel.  This is ordered her lab appointment has been scheduled.  I reviewed strict return precautions. If she continues to vomit, has dizziness,  reduced urine output or has worsening of her kidney injury will send directly to emergency department. All questions were answered. Patient repeated back to me which medications to stop.   Terisa Starr, MD  Family Medicine Teaching Service

## 2018-08-01 ENCOUNTER — Ambulatory Visit (INDEPENDENT_AMBULATORY_CARE_PROVIDER_SITE_OTHER): Payer: 59 | Admitting: *Deleted

## 2018-08-01 ENCOUNTER — Other Ambulatory Visit: Payer: 59

## 2018-08-01 DIAGNOSIS — N179 Acute kidney failure, unspecified: Secondary | ICD-10-CM

## 2018-08-01 NOTE — Progress Notes (Signed)
Patient here today for BP check.  She was instructed to stop losartan and HCTZ by Dr. Manson Passey on 07/29/18.  BP today is 152/82.  Checked BP in left arm with large cuff.    Symptoms present: none.    Routed note to PCP and Dr. Manson Passey.   Pt is expecting call from Dr. Manson Passey tomorrow with lab results.   FYI: I had her sign up from mychart while in clinic in case she needs a note to be out of work.   Fleeger, Maryjo Rochester, CMA

## 2018-08-02 ENCOUNTER — Telehealth: Payer: Self-pay | Admitting: Family Medicine

## 2018-08-02 LAB — BASIC METABOLIC PANEL
BUN/Creatinine Ratio: 18 (ref 9–23)
BUN: 19 mg/dL (ref 6–24)
CHLORIDE: 94 mmol/L — AB (ref 96–106)
CO2: 26 mmol/L (ref 20–29)
Calcium: 8.3 mg/dL — ABNORMAL LOW (ref 8.7–10.2)
Creatinine, Ser: 1.03 mg/dL — ABNORMAL HIGH (ref 0.57–1.00)
GFR calc Af Amer: 70 mL/min/{1.73_m2} (ref >59)
GFR calc non Af Amer: 61 mL/min/{1.73_m2} (ref >59)
Glucose: 293 mg/dL — ABNORMAL HIGH (ref 65–99)
POTASSIUM: 3.2 mmol/L — AB (ref 3.5–5.2)
SODIUM: 138 mmol/L (ref 134–144)

## 2018-08-02 NOTE — Telephone Encounter (Signed)
Called patient with results. AKI improving, nearly back to baseline (0.7-0.9). Will re-start antihypertensives tomorrow, repeat metabolic panel at follow up with PCP.   Terisa Starr, MD  Family Medicine Teaching Service

## 2018-08-18 ENCOUNTER — Other Ambulatory Visit: Payer: Self-pay

## 2018-08-18 DIAGNOSIS — I10 Essential (primary) hypertension: Secondary | ICD-10-CM

## 2018-08-18 DIAGNOSIS — E119 Type 2 diabetes mellitus without complications: Secondary | ICD-10-CM

## 2018-08-19 MED ORDER — LOSARTAN POTASSIUM 50 MG PO TABS: 50.0000 mg | ORAL_TABLET | Freq: Every day | ORAL | 3 refills | Status: DC

## 2018-08-19 MED ORDER — GLIMEPIRIDE 4 MG PO TABS: 4.0000 mg | ORAL_TABLET | Freq: Every day | ORAL | 3 refills | Status: DC

## 2018-08-21 ENCOUNTER — Ambulatory Visit: Payer: 59

## 2018-08-21 ENCOUNTER — Other Ambulatory Visit: Payer: Self-pay

## 2018-08-21 ENCOUNTER — Ambulatory Visit (INDEPENDENT_AMBULATORY_CARE_PROVIDER_SITE_OTHER): Payer: Self-pay | Admitting: Family Medicine

## 2018-08-21 ENCOUNTER — Encounter: Payer: Self-pay | Admitting: Family Medicine

## 2018-08-21 VITALS — BP 128/86 | HR 80 | Temp 98.3°F | Ht 62.0 in | Wt 220.0 lb

## 2018-08-21 DIAGNOSIS — I10 Essential (primary) hypertension: Secondary | ICD-10-CM

## 2018-08-21 DIAGNOSIS — E119 Type 2 diabetes mellitus without complications: Secondary | ICD-10-CM

## 2018-08-21 LAB — POCT GLYCOSYLATED HEMOGLOBIN (HGB A1C): HBA1C, POC (CONTROLLED DIABETIC RANGE): 9.6 % — AB (ref 0.0–7.0)

## 2018-08-21 MED ORDER — ATORVASTATIN CALCIUM 40 MG PO TABS: 40.0000 mg | ORAL_TABLET | ORAL | 3 refills | Status: DC

## 2018-08-21 NOTE — Progress Notes (Signed)
   Subjective:    Patient ID: Carmen [name] is a 58 y.o. female presenting with Diabetes and Hypertension  on 08/21/2018  HPI: Here for f/u following acute viral GE. Had some metabolic derangements and asked to stop meds. These have been restarted x 3 wks. Has no money for food. Gets food stamps. Has to eat what is available. Still working at the [employer], but only working 3 hours/wk. Having trouble affording meds. Knows her CBGs are out of whack. In fact, has not returned for several months due to being concerned about my reaction to her CBGs. Taking 2 oral meds and not having the same issues with diarrhea she was having. Cannot afford diabetic eye exam.  Review of Systems  Constitutional: Negative for chills and fever.  Respiratory: Negative for shortness of breath.   Cardiovascular: Negative for chest pain.  Gastrointestinal: Negative for abdominal pain, nausea and vomiting.  Genitourinary: Negative for dysuria.  Skin: Negative for rash.      Objective:    BP 128/86   Pulse 80   Temp 98.3 F (36.8 C) (Oral)   Ht 5\' 2"  (1.575 m)   Wt 220 lb (99.8 kg)   BMI 40.24 kg/m  Physical Exam Constitutional:      General: She is not in acute distress.    Appearance: She is well-developed.  HENT:     Head: Normocephalic and atraumatic.  Eyes:     General: No scleral icterus. Neck:     Musculoskeletal: Neck supple.  Cardiovascular:     Rate and Rhythm: Normal rate.  Pulmonary:     Effort: Pulmonary effort is normal.  Abdominal:     Palpations: Abdomen is soft.  Skin:    General: Skin is warm and dry.  Neurological:     Mental Status: She is alert and oriented to person, place, and time.         Assessment & Plan:   Problem List Items Addressed This Visit      High   HYPERTENSION, BENIGN SYSTEMIC (Chronic)    Continue Losartan, HCTZ      Relevant Medications   atorvastatin (LIPITOR) 40 MG tablet   Other Relevant Orders   Comprehensive metabolic panel   Diabetes mellitus without complication (HCC) - Primary (Chronic)    Needs eye exam, foot exam--is exercising. Diet reviewed. Continue amaryl and Glucophage. May need to add another agent and cost is a huge issue. Check labs today      Relevant Medications   atorvastatin (LIPITOR) 40 MG tablet   Other Relevant Orders   HgB A1c (Completed)   Comprehensive metabolic panel   TSH   Lipid panel      Total face-to-face time with patient: 15 minutes. Over 50% of encounter was spent on counseling and coordination of care. Return in about 3 months (around 11/21/2018).  Reva Bores 08/21/2018 10:57 AM

## 2018-08-21 NOTE — Patient Instructions (Signed)
Diabetes Basics    Diabetes (diabetes mellitus) is a long-term (chronic) disease. It occurs when the body does not properly use sugar (glucose) that is released from food after you eat.  Diabetes may be caused by one or both of these problems:  · Your pancreas does not make enough of a hormone called insulin.  · Your body does not react in a normal way to insulin that it makes.  Insulin lets sugars (glucose) go into cells in your body. This gives you energy. If you have diabetes, sugars cannot get into cells. This causes high blood sugar (hyperglycemia).  Follow these instructions at home:  How is diabetes treated?  You may need to take insulin or other diabetes medicines daily to keep your blood sugar in balance. Take your diabetes medicines every day as told by your doctor. List your diabetes medicines here:  Diabetes medicines  · Name of medicine: ______________________________  ? Amount (dose): _______________ Time (a.m./p.m.): _______________ Notes: ___________________________________  · Name of medicine: ______________________________  ? Amount (dose): _______________ Time (a.m./p.m.): _______________ Notes: ___________________________________  · Name of medicine: ______________________________  ? Amount (dose): _______________ Time (a.m./p.m.): _______________ Notes: ___________________________________  If you use insulin, you will learn how to give yourself insulin by injection. You may need to adjust the amount based on the food that you eat. List the types of insulin you use here:  Insulin  · Insulin type: ______________________________  ? Amount (dose): _______________ Time (a.m./p.m.): _______________ Notes: ___________________________________  · Insulin type: ______________________________  ? Amount (dose): _______________ Time (a.m./p.m.): _______________ Notes: ___________________________________  · Insulin type: ______________________________  ? Amount (dose): _______________ Time (a.m./p.m.):  _______________ Notes: ___________________________________  · Insulin type: ______________________________  ? Amount (dose): _______________ Time (a.m./p.m.): _______________ Notes: ___________________________________  · Insulin type: ______________________________  ? Amount (dose): _______________ Time (a.m./p.m.): _______________ Notes: ___________________________________  How do I manage my blood sugar?    Check your blood sugar levels using a blood glucose monitor as directed by your doctor.  Your doctor will set treatment goals for you. Generally, you should have these blood sugar levels:  · Before meals (preprandial): 80-130 mg/dL (4.4-7.2 mmol/L).  · After meals (postprandial): below 180 mg/dL (10 mmol/L).  · A1c level: less than 7%.  Write down the times that you will check your blood sugar levels:  Blood sugar checks  · Time: _______________ Notes: ___________________________________  · Time: _______________ Notes: ___________________________________  · Time: _______________ Notes: ___________________________________  · Time: _______________ Notes: ___________________________________  · Time: _______________ Notes: ___________________________________  · Time: _______________ Notes: ___________________________________    What do I need to know about low blood sugar?  Low blood sugar is called hypoglycemia. This is when blood sugar is at or below 70 mg/dL (3.9 mmol/L). Symptoms may include:  · Feeling:  ? Hungry.  ? Worried or nervous (anxious).  ? Sweaty and clammy.  ? Confused.  ? Dizzy.  ? Sleepy.  ? Sick to your stomach (nauseous).  · Having:  ? A fast heartbeat.  ? A headache.  ? A change in your vision.  ? Tingling or no feeling (numbness) around the mouth, lips, or tongue.  ? Jerky movements that you cannot control (seizure).  · Having trouble with:  ? Moving (coordination).  ? Sleeping.  ? Passing out (fainting).  ? Getting upset easily (irritability).  Treating low blood sugar  To treat low blood  sugar, eat or drink something sugary right away. If you can think clearly and swallow safely, follow the 15:15   rule:  · Take 15 grams of a fast-acting carb (carbohydrate). Talk with your doctor about how much you should take.  · Some fast-acting carbs are:  ? Sugar tablets (glucose pills). Take 3-4 glucose pills.  ? 6-8 pieces of hard candy.  ? 4-6 oz (120-150 mL) of fruit juice.  ? 4-6 oz (120-150 mL) of regular (not diet) soda.  ? 1 Tbsp (15 mL) honey or sugar.  · Check your blood sugar 15 minutes after you take the carb.  · If your blood sugar is still at or below 70 mg/dL (3.9 mmol/L), take 15 grams of a carb again.  · If your blood sugar does not go above 70 mg/dL (3.9 mmol/L) after 3 tries, get help right away.  · After your blood sugar goes back to normal, eat a meal or a snack within 1 hour.  Treating very low blood sugar  If your blood sugar is at or below 54 mg/dL (3 mmol/L), you have very low blood sugar (severe hypoglycemia). This is an emergency. Do not wait to see if the symptoms will go away. Get medical help right away. Call your local emergency services (911 in the [place] Do not drive yourself to the hospital.  Questions to ask your health care provider  · Do I need to meet with a diabetes educator?  · What equipment will I need to care for myself at home?  · What diabetes medicines do I need? When should I take them?  · How often do I need to check my blood sugar?  · What number can I call if I have questions?  · When is my next doctor's visit?  · Where can I find a support group for people with diabetes?  Where to find more information  · American Diabetes Association: www.diabetes.org  · American Association of Diabetes Educators: www.diabeteseducator.org/patient-resources  Contact a doctor if:  · Your blood sugar is at or above 240 mg/dL (13.3 mmol/L) for 2 days in a row.  · You have been sick or have had a fever for 2 days or more, and you are not getting better.  · You have any of these  problems for more than 6 hours:  ? You cannot eat or drink.  ? You feel sick to your stomach (nauseous).  ? You throw up (vomit).  ? You have watery poop (diarrhea).  Get help right away if:  · Your blood sugar is lower than 54 mg/dL (3 mmol/L).  · You get confused.  · You have trouble:  ? Thinking clearly.  ? Breathing.  Summary  · Diabetes (diabetes mellitus) is a long-term (chronic) disease. It occurs when the body does not properly use sugar (glucose) that is released from food after digestion.  · Take insulin and diabetes medicines as told.  · Check your blood sugar every day, as often as told.  · Keep all follow-up visits as told by your doctor. This is important.  This information is not intended to replace advice given to you by your health care provider. Make sure you discuss any questions you have with your health care provider.  Document Released: 08/26/2017 Document Revised: 11/14/2017 Document Reviewed: 08/26/2017  Elsevier Interactive Patient Education © 2019 [employer]

## 2018-08-21 NOTE — Assessment & Plan Note (Addendum)
Needs eye exam, foot exam--is exercising. Diet reviewed. Continue amaryl and Glucophage. May need to add another agent and cost is a huge issue. Check labs today

## 2018-08-21 NOTE — Progress Notes (Signed)
21 

## 2018-08-21 NOTE — Assessment & Plan Note (Signed)
Continue Losartan, HCTZ

## 2018-08-22 LAB — LIPID PANEL
Chol/HDL Ratio: 3.8 ratio (ref 0.0–4.4)
Cholesterol, Total: 188 mg/dL (ref 100–199)
HDL: 50 mg/dL (ref >39)
LDL Calculated: 93 mg/dL (ref 0–99)
Triglycerides: 223 mg/dL — ABNORMAL HIGH (ref 0–149)
VLDL Cholesterol Cal: 45 mg/dL — ABNORMAL HIGH (ref 5–40)

## 2018-08-22 LAB — COMPREHENSIVE METABOLIC PANEL
A/G RATIO: 1.7 (ref 1.2–2.2)
ALT: 19 IU/L (ref 0–32)
AST: 14 IU/L (ref 0–40)
Albumin: 4 g/dL (ref 3.8–4.9)
Alkaline Phosphatase: 70 IU/L (ref 39–117)
BUN/Creatinine Ratio: 16 (ref 9–23)
BUN: 12 mg/dL (ref 6–24)
Bilirubin Total: 0.4 mg/dL (ref 0.0–1.2)
CALCIUM: 8.2 mg/dL — AB (ref 8.7–10.2)
CO2: 24 mmol/L (ref 20–29)
Chloride: 98 mmol/L (ref 96–106)
Creatinine, Ser: 0.76 mg/dL (ref 0.57–1.00)
GFR calc Af Amer: 101 mL/min/{1.73_m2} (ref >59)
GFR calc non Af Amer: 88 mL/min/{1.73_m2} (ref >59)
Globulin, Total: 2.3 g/dL (ref 1.5–4.5)
Glucose: 220 mg/dL — ABNORMAL HIGH (ref 65–99)
Potassium: 3.4 mmol/L — ABNORMAL LOW (ref 3.5–5.2)
Sodium: 141 mmol/L (ref 134–144)
Total Protein: 6.3 g/dL (ref 6.0–8.5)

## 2018-08-22 LAB — TSH: TSH: 4.53 u[IU]/mL — ABNORMAL HIGH (ref 0.450–4.500)

## 2018-08-22 NOTE — Addendum Note (Signed)
Addended by: Jennette Bill on: 08/22/2018 02:50 PM   Modules accepted: Orders

## 2018-08-23 LAB — T4, FREE: Free T4: 1.02 ng/dL (ref 0.82–1.77)

## 2018-08-23 LAB — T3, FREE: T3, Free: 2.9 pg/mL (ref 2.0–4.4)

## 2018-09-11 ENCOUNTER — Telehealth: Payer: Self-pay

## 2018-09-11 NOTE — Telephone Encounter (Signed)
Pt called nurse line stating she was informed by her boyfriend to not take any ibuprofen during the coronavirus. Pt stated she has osteoarthritis and "hurts everyday." Pt stated she has been using Tylenol, but is worried of side effects. Pt would like some advice on whether or not she should continue tylenol until covid is gone, or if ibuprofen is safe to take?

## 2018-09-12 NOTE — Telephone Encounter (Signed)
Called patient to inform to continue taking Tylenol. Pt stated, "I do not want to risk my insides taking Tylenol daily." Pt is requesting something to be called in for her, until it is safe to take Ibuprofen again. Please advise. I can set her up in telemedicine if you think appropriate.

## 2018-09-14 ENCOUNTER — Telehealth: Payer: Self-pay | Admitting: Family Medicine

## 2018-09-14 ENCOUNTER — Telehealth (INDEPENDENT_AMBULATORY_CARE_PROVIDER_SITE_OTHER): Payer: Self-pay | Admitting: Family Medicine

## 2018-09-14 ENCOUNTER — Other Ambulatory Visit: Payer: Self-pay

## 2018-09-14 DIAGNOSIS — M159 Polyosteoarthritis, unspecified: Secondary | ICD-10-CM

## 2018-09-14 DIAGNOSIS — M150 Primary generalized (osteo)arthritis: Secondary | ICD-10-CM

## 2018-09-14 NOTE — Progress Notes (Signed)
 [facility]  Patient consented to have visit conducted via telephone.  Encounter participants: Patient: Carmen [name]  Provider: Janit Pagan  Others (if applicable): NA  Chief Complaint: OA pain  HPI:  C/O hip joint, hand, and finger pain ongoing for months. She endorsed occasional frozen joints. She attributed her pain to OA. She typically feels better with OTC Ibuprofen. However, she read in the news that Ibuprofen makes COVID infection worse. Hence she switched to Tylenol Arthritis, which has not helped much. She will like to know what other meds to take for her pain. A pharmacist talked to her about Humira, and she wonders if that will be a better option for her pain. She does not want to take too much Tylenol for fear of liver damage.  She denies fever, no cough, no SOB, denies exposure to a person with confirmed COVID infection.   ROS: As in the body of hx.  Pertinent PMHx: Problem list reviewed  Exam:  Respiratory: Could complete a full sentence without difficulty.  Assessment/Plan:  Osteoarthritis of multiple joints Symptoms somewhat inconsistent with OA given small joint pain with stiffness. However, she had a normal RF and ANA in 2016. As discussed with her, there is no data out there to support the fact that Ibuprofen can worsen COVID-19 symptoms. Also, she currently does not have COVID-19 related symptoms or risk due to a lack of exposure. It is probably safe to continue Ibuprofen while asymptomatic. She can alternate with Tylenol as needed for her pain. Since she does not have confirmed RA, Humira is not a good option, and she will need a rheumatologic eval/referral for this. She insisted on an alternate med. I advised that the other option will be Opioid or Tramadol. I will defer the initiation of such meds to her PCP. She is informed that I will message her PCP regarding her request for an alternate pain med option  and have her decide on Opioid management. She agreed with the plan. F/U soon for repeat RA workup.    Time spent on phone with patient: 13 minutes

## 2018-09-14 NOTE — Telephone Encounter (Signed)
See note

## 2018-09-14 NOTE — Telephone Encounter (Signed)
Scheduled patient for telehealth visit for this afternoon. Pt is aware.

## 2018-09-14 NOTE — Assessment & Plan Note (Signed)
Symptoms somewhat inconsistent with OA given small joint pain with stiffness. However, she had a normal RF and ANA in 2016. As discussed with her, there is no data out there to support the fact that Ibuprofen can worsen COVID-19 symptoms. Also, she currently does not have COVID-19 related symptoms or risk due to a lack of exposure. It is probably safe to continue Ibuprofen while asymptomatic. She can alternate with Tylenol as needed for her pain. Since she does not have confirmed RA, Humira is not a good option, and she will need a rheumatologic eval/referral for this. She insisted on an alternate med. I advised that the other option will be Opioid or Tramadol. I will defer the initiation of such meds to her PCP. She is informed that I will message her PCP regarding her request for an alternate pain med option and have her decide on Opioid management. She agreed with the plan. F/U soon for repeat RA workup.

## 2018-09-20 NOTE — Telephone Encounter (Signed)
Discussed taking Ibuprofen in setting of COVID, but needs it for her arthritis. Has carpal tunnel and arthritis in the shoulder and hip and joint pain. Was improving with staying active. She was using Hemp freeze, this was helping but the stores are closed. Tried Glucosamine and Chondroitin in the past. Discussed Turmeric. She is worried about her Ibuprofen hurting her kidneys.  Discussed limits. She declined naprosyn.

## 2018-09-28 ENCOUNTER — Telehealth: Payer: Self-pay | Admitting: Family Medicine

## 2018-09-28 NOTE — Telephone Encounter (Signed)
Rash on neck, chest and between breast.  Has tried vaseline, hydrocortisone cream, benadryl cream and also tried taking benadryl pill (25mg ) daily as needed. It does not always help. She states that she received a new used mattress that she cleaned with several different chemicals.  She states that the rash is getting a little better.  She is afraid that it is a symptoms of Corona.  She denies fever, cough (other than allergy), SOB or exposure.  Reassured patient that this is not a common symptom per the CDC.  Advised that I could set her up for a televisit but she declines at this time.   Ultimately, I think she wanted a corona test because of the rash.  35 minutes spent on phone with patient.  Pt will monitor rash and call back if needed.  She would also like Dr. Shawnie Pons to call her "if she has time".  Jone Baseman, CMA

## 2018-09-28 NOTE — Telephone Encounter (Signed)
Pt called this morning stating that she is having allergy issues, and an allergic reaction, and she would like to receive a call back. Please give pt a call.

## 2018-10-03 ENCOUNTER — Telehealth: Payer: 59 | Admitting: Family Medicine

## 2018-10-03 ENCOUNTER — Other Ambulatory Visit: Payer: Self-pay

## 2018-10-06 ENCOUNTER — Ambulatory Visit (INDEPENDENT_AMBULATORY_CARE_PROVIDER_SITE_OTHER): Payer: Self-pay | Admitting: Family Medicine

## 2018-10-06 ENCOUNTER — Other Ambulatory Visit: Payer: Self-pay

## 2018-10-06 VITALS — BP 162/100 | HR 106

## 2018-10-06 DIAGNOSIS — Z2089 Contact with and (suspected) exposure to other communicable diseases: Secondary | ICD-10-CM

## 2018-10-06 DIAGNOSIS — Z207 Contact with and (suspected) exposure to pediculosis, acariasis and other infestations: Secondary | ICD-10-CM | POA: Insufficient documentation

## 2018-10-06 MED ORDER — PERMETHRIN 5 % EX CREA: 1.0000 "application " | TOPICAL_CREAM | Freq: Once | CUTANEOUS | 0 refills | Status: AC

## 2018-10-06 NOTE — Patient Instructions (Signed)
It was great to meet you today! Thank you for letting me participate in your care!  Today, we discussed your recent rash. While it does not look like typical scabies given your history and symptoms I agree that a treatment is reasonable. I have sent the prescription to the pharmacy. Please pick it up and use as directed.   Please follow up with Dr. Shawnie Pons about your blood pressure.  Be well, Jules Schick, DO PGY-2, [facility]

## 2018-10-06 NOTE — Assessment & Plan Note (Signed)
Patient is convinced her rash and symptoms are from another case of scabies which she has had in the past. Her rash and pattern are not consistent with scabies but she did note her last episode had an atypical presentation.   May be an underlying anxiety component to her symptoms as she seems to get fixated on certain topics, often gets lost in her own ideas, and appears to connect topics that are unrelated to her current symptoms.  - Permethrin cream 5% to apply once - If symptoms persist or worsen perform GAD-7, PHQ-9, and consider psych consult

## 2018-10-06 NOTE — Progress Notes (Signed)
     Subjective: Chief Complaint  Patient presents with  . Allergic Reaction    HPI: Carmen [name] is a 58 y.o. presenting to clinic today to discuss the following:  Rash Patient wrote 10 pages of "medical history" for me to review. At first she was concerned with having COVID-19 due to developing a skin rash. She then talked about getting a new mattress that smelled like "cat urine and cigarettes" and how this upset her. She slept on it several times and now thinks she may have bed bugs. The rash has improved on its own and is not currently itching. She takes diphenhydramine for the itching which she says helps. No fevers, chills, cough, nausea, vomiting, or abdominal pain.  Patient is concerned about having another episode of scabies.  She is no longer concerned for COVID as she states she was worried about her boyfriend giving it to her but he "tested negative".  ROS noted in HPI.   Past Medical, Surgical, Social, and Family History Reviewed & Updated per EMR.   Pertinent Historical Findings include:   Social History   Tobacco Use  Smoking Status Former Smoker  . Packs/day: 0.25  . Years: 30.00  . Pack years: 7.50  . Types: Cigarettes  . Last attempt to quit: 08/13/2007  . Years since quitting: 11.1  Smokeless Tobacco Never Used  Tobacco Comment   boyfriend smokes   Objective: BP (!) 162/100   Pulse (!) 106   SpO2 98%  Vitals and nursing notes reviewed  Physical Exam Gen: Alert and Oriented x 3, NAD HEENT: Normocephalic, atraumatic Resp: NWOB, speaks in full sentences Ext: no clubbing, cyanosis, or edema Skin: warm, dry, intact, multiple isolated small erythematous papules on both legs in random distribution. Hands have small dry red macules  Assessment/Plan:  Scabies exposure Patient is convinced her rash and symptoms are from another case of scabies which she has had in the past. Her rash and pattern are not consistent with scabies but she did note her  last episode had an atypical presentation.   May be an underlying anxiety component to her symptoms as she seems to get fixated on certain topics, often gets lost in her own ideas, and appears to connect topics that are unrelated to her current symptoms.  - Permethrin cream 5% to apply once - If symptoms persist or worsen perform GAD-7, PHQ-9, and consider psych consult   PATIENT EDUCATION PROVIDED: See AVS    Diagnosis and plan along with any newly prescribed medication(s) were discussed in detail with this patient today. The patient verbalized understanding and agreed with the plan. Patient advised if symptoms worsen return to clinic or ER.   Meds ordered this encounter  Medications  . permethrin (ELIMITE) 5 % cream    Sig: Apply 1 application topically once for 1 dose.    Dispense:  60 g    Refill:  0     Tim Karen Chafe, DO 10/06/2018, 11:52 AM PGY-2 [facility]

## 2018-10-09 ENCOUNTER — Other Ambulatory Visit: Payer: Self-pay

## 2018-10-09 ENCOUNTER — Encounter (HOSPITAL_COMMUNITY): Payer: Self-pay

## 2018-10-09 ENCOUNTER — Ambulatory Visit (HOSPITAL_COMMUNITY)
Admission: EM | Admit: 2018-10-09 | Discharge: 2018-10-09 | Disposition: A | Discharge status: Home / Self Care | Payer: Self-pay | Attending: Family Medicine | Admitting: Family Medicine

## 2018-10-09 ENCOUNTER — Ambulatory Visit (INDEPENDENT_AMBULATORY_CARE_PROVIDER_SITE_OTHER): Payer: Self-pay

## 2018-10-09 DIAGNOSIS — R079 Chest pain, unspecified: Secondary | ICD-10-CM

## 2018-10-09 DIAGNOSIS — I10 Essential (primary) hypertension: Secondary | ICD-10-CM

## 2018-10-09 DIAGNOSIS — R21 Rash and other nonspecific skin eruption: Secondary | ICD-10-CM

## 2018-10-09 MED ORDER — ALUM & MAG HYDROXIDE-SIMETH 200-200-20 MG/5ML PO SUSP
ORAL | Status: AC
  Filled 2018-10-09: qty 30

## 2018-10-09 MED ORDER — CARVEDILOL 6.25 MG PO TABS: 6.2500 mg | ORAL_TABLET | Freq: Two times a day (BID) | ORAL | 1 refills | Status: DC

## 2018-10-09 MED ORDER — LIDOCAINE VISCOUS HCL 2 % MT SOLN
OROMUCOSAL | Status: AC
  Filled 2018-10-09: qty 15

## 2018-10-09 MED ORDER — ALUM & MAG HYDROXIDE-SIMETH 200-200-20 MG/5ML PO SUSP
30.0000 mL | Freq: Once | ORAL | Status: AC
  Administered 2018-10-09: 30 mL via ORAL

## 2018-10-09 MED ORDER — LIDOCAINE VISCOUS HCL 2 % MT SOLN
15.0000 mL | Freq: Once | OROMUCOSAL | Status: AC
  Administered 2018-10-09: 11:00:00 15 mL via ORAL

## 2018-10-09 NOTE — ED Triage Notes (Signed)
Patient presents to Urgent Care with complaints of nasal congestion since March, and a rash for 2 weeks since dealing with a hand-me-down mattress. Pt would also like her blood pressure checked today and wants something for acid reflux, which has caused unpleasant belching. Pt was just seen by her PCP on Friday, has premetherin rx for rash.

## 2018-10-09 NOTE — Discharge Instructions (Signed)
Your EKG and chest x-ray not show anything concerning. We gave you a GI cocktail here in clinic to help with the acid reflux and chest discomfort I am restarting your carvedilol due to the increase in your blood pressure I would like for you to follow-up on Friday for a recheck of your blood pressure If you develop any more severe symptoms between now and then to include worsening chest pain, shortness of breath or dizziness you need to go to the ER. Otherwise follow-up with your primary care provider as needed

## 2018-10-10 NOTE — ED Provider Notes (Signed)
[facility]    CSN: [id number] Arrival date & time: 10/09/18  8657     History   Chief Complaint Chief Complaint  Patient presents with  . Multiple Complaints    HPI Carmen [name] is a 58 y.o. female.   Patient is a 58 year old female with past medical history of allergies, COPD, depression, GERD, hypertension, borderline personality disorder, diabetes that presents today with multiple complaints.  She is complaining of nasal congestion, allergy type symptoms since March.  She has had some mild chest tightness.  Denies any cough.  The symptoms have been waxing and waning over the past month.  She takes Zyrtec for her allergies and Flonase.    Reports that this weekend she ate a large meal and started having some indigestion type symptoms.  She takes omeprazole as needed for her symptoms.  This did not seem to help much.  She has also had increased bloating and belching.  Denies any shortness of breath or palpitations.  She is also complaining of rash that is mostly resolved.  Denies any nausea, vomiting or diarrhea.  Denies any abdominal discomfort.  She was seen by her PCP for rash on Friday and given permethrin for possible scabies.  Reports that she is getting ready to a second treatment. Her symptoms have improved.   Over the weekend she was concerned due to her blood pressure being constantly elevated.  She checked it multiple times at different stores.  Reporting a sensation of " just not feeling right".  Denies any dizziness but has mild headache.  She has still continued to work.  BP elevated at 189/104 today.  Denies any numbness, tingling, slurred speech or trouble with vision. Reports that she was also checked out by EMS at the local fire station.   ROS per HPI      Past Medical History:  Diagnosis Date  . Adhesive capsulitis of shoulder   . Allergy   . Borderline personality disorder (HCC)   . Carpal tunnel syndrome   . COPD (chronic obstructive  pulmonary disease) (HCC)   . Depression   . GERD (gastroesophageal reflux disease)   . Hypertension     Patient Active Problem List   Diagnosis Date Noted  . Scabies exposure 10/06/2018  . Contact dermatitis 03/25/2017  . Chronic idiopathic constipation 01/05/2017  . Orthostatic hypotension 12/20/2016  . Vertigo 12/20/2016  . Anxiety 07/20/2016  . Hemorrhoids 11/27/2015  . Left medial knee pain 10/24/2015  . Back pain 02/27/2015  . Osteosclerosis 07/30/2014  . Numbness and tingling in left arm 07/01/2014  . Diabetes mellitus without complication (HCC) 11/15/2012  . Otomycosis 09/24/2011  . Metabolic syndrome 08/19/2011  . Carpal tunnel syndrome 06/10/2010  . Allergic rhinitis 11/18/2008  . Osteoarthritis of multiple joints 07/30/2008  . Adhesive capsulitis of shoulder 01/15/2008  . Obesity 07/25/2007  . GERD 09/20/2006  . DEPRESSION, MAJOR, RECURRENT 08/04/2006  . BORDERLINE PERSONALITY 08/04/2006  . HYPERTENSION, BENIGN SYSTEMIC 08/04/2006  . Irritable bowel syndrome 08/04/2006    Past Surgical History:  Procedure Laterality Date  . CHOLECYSTECTOMY    . TONSILLECTOMY      OB History   No obstetric history on file.      Home Medications    Prior to Admission medications   Medication Sig Start Date End Date Taking? Authorizing Provider  aspirin EC 81 MG tablet Take 81 mg by mouth daily.    [provider]  atorvastatin (LIPITOR) 40 MG tablet Take 1 tablet (40  mg total) by mouth 2 (two) times a week. Patient not taking: Reported on 09/14/2018 08/21/18   Reva Bores, MD  carvedilol (COREG) 6.25 MG tablet Take 1 tablet (6.25 mg total) by mouth 2 (two) times daily with a meal. 10/09/18   Darbie Biancardi A, NP  cetirizine (ZYRTEC) 10 MG tablet Take 10 mg by mouth daily.    [provider]  glimepiride (AMARYL) 4 MG tablet Take 1 tablet (4 mg total) by mouth daily before breakfast. Patient to finish supply of Glipizide then only take Glimepiride. 08/19/18    Reva Bores, MD  hydrochlorothiazide (MICROZIDE) 12.5 MG capsule Take 1 capsule (12.5 mg total) by mouth daily. 07/19/18   Reva Bores, MD  losartan (COZAAR) 50 MG tablet Take 1 tablet (50 mg total) by mouth daily. 08/19/18   Reva Bores, MD  metFORMIN (GLUCOPHAGE-XR) 500 MG 24 hr tablet TAKE TWO TABLETS BY MOUTH TWICE PER DAY 07/24/18   Westley Chandler, MD  Pseudoephedrine HCl (NASAL DECONGESTANT PO) Take by mouth.    [provider]  triamcinolone (NASACORT ALLERGY 24HR) 55 MCG/ACT AERO nasal inhaler Place 2 sprays into the nose daily.    [provider]  clonazePAM (KLONOPIN) 0.5 MG tablet Take 1 tablet (0.5 mg total) by mouth 2 (two) times daily as needed for anxiety. 01/11/11 08/19/11  Reva Bores, MD  venlafaxine (EFFEXOR) 75 MG tablet Take 1 tablet (75 mg total) by mouth 2 (two) times daily. 03/15/11 08/19/11  Reva Bores, MD    Family History Family History  Problem Relation Age of Onset  . Diabetes Mother   . Hypertension Mother     Social History Social History   Tobacco Use  . Smoking status: Former Smoker    Packs/day: 0.25    Years: 30.00    Pack years: 7.50    Types: Cigarettes    Last attempt to quit: 08/13/2007    Years since quitting: 11.1  . Smokeless tobacco: Never Used  . Tobacco comment: boyfriend smokes  Substance Use Topics  . Alcohol use: No    Comment: rarely  . Drug use: No     Allergies   Ace inhibitors and Metformin and related   Review of Systems Review of Systems   Physical Exam Triage Vital Signs ED Triage Vitals  Enc Vitals Group     BP 10/09/18 0901 (!) 189/104     Pulse Rate 10/09/18 0901 86     Resp 10/09/18 0901 18     Temp 10/09/18 0901 98.3 F (36.8 C)     Temp Source 10/09/18 0901 Oral     SpO2 10/09/18 0901 99 %     Weight --      Height --      Head Circumference --      Peak Flow --      Pain Score 10/09/18 0859 0     Pain Loc --      Pain Edu? --      Excl. in GC? --    No data found.   Updated Vital Signs BP (!) 189/104 (BP Location: Right Arm)   Pulse 86   Temp 98.3 F (36.8 C) (Oral)   Resp 18   SpO2 99%   Visual Acuity Right Eye Distance:   Left Eye Distance:   Bilateral Distance:    Right Eye Near:   Left Eye Near:    Bilateral Near:     Physical Exam Vitals signs  and nursing note reviewed.  Constitutional:      General: She is not in acute distress.    Appearance: Normal appearance. She is not ill-appearing, toxic-appearing or diaphoretic.  HENT:     Head: Normocephalic and atraumatic.     Right Ear: Tympanic membrane and ear canal normal.     Left Ear: Tympanic membrane and ear canal normal.     Nose: Nose normal.     Mouth/Throat:     Pharynx: Oropharynx is clear.  Eyes:     Conjunctiva/sclera: Conjunctivae normal.  Neck:     Musculoskeletal: Normal range of motion and neck supple.  Cardiovascular:     Rate and Rhythm: Normal rate and regular rhythm.     Pulses: Normal pulses.  Pulmonary:     Effort: Pulmonary effort is normal.     Breath sounds: Normal breath sounds.  Abdominal:     Palpations: Abdomen is soft.     Tenderness: There is no abdominal tenderness.  Musculoskeletal: Normal range of motion.  Skin:    General: Skin is warm and dry.     Findings: Rash present.  Neurological:     General: No focal deficit present.     Mental Status: She is alert.     Sensory: No sensory deficit.     Motor: No weakness.     Gait: Gait normal.  Psychiatric:        Mood and Affect: Mood normal.       UC Treatments / Results  Labs (all labs ordered are listed, but only abnormal results are displayed) Labs Reviewed - No data to display  EKG None  Radiology Dg Chest 2 View  Result Date: 10/09/2018 CLINICAL DATA:  Middle congestion and chest discomfort for 2 weeks EXAM: CHEST - 2 VIEW COMPARISON:  October 03, 2013 FINDINGS: The heart size and mediastinal contours are within normal limits. There is no focal infiltrate, pulmonary edema, or  pleural effusion. Degenerative joint changes of bilateral shoulders and spine are noted. IMPRESSION: No active cardiopulmonary disease. Electronically Signed   By: Sherian Rein M.D.   On: 10/09/2018 10:12    Procedures Procedures (including critical care time)  Medications Ordered in UC Medications  alum & mag hydroxide-simeth (MAALOX/MYLANTA) 200-200-20 MG/5ML suspension 30 mL (30 mLs Oral Given 10/09/18 1058)    And  lidocaine (XYLOCAINE) 2 % viscous mouth solution 15 mL (15 mLs Oral Given 10/09/18 1058)    Initial Impression / Assessment and Plan / UC Course  I have reviewed the triage vital signs and the nursing notes.  Pertinent labs & imaging results that were available during my care of the patient were reviewed by me and considered in my medical decision making (see chart for details).     Patient is a 58 year old female here with multiple complaints.  She has a rash that is somewhat resolved. We will have her continue the permethrin as planned. She is also concerned of her elevated blood pressure and tightness across her chest over the weekend.  She has had some acid reflux type symptoms and bloating and belching. Took omeprazole without much relief.  Gi cocktail given here in the clinic with some relief.   EKG here not concerning for ACS. Chest x-ray normal She is very hypertensive here in clinic at 189/104.  She is currently taking losartan for her blood pressure. She has not missed any doses.  She reports that she used to be on carvedilol but stopped taking that. I feel it  would be appropriate to go ahead and restart her carvedilol and have her follow-up with her primary care doctor for blood pressure recheck. Told her to keep monitoring her blood pressures at home If she starts developing any symptoms to include dizziness, weakness, slurred speech, vision changes, chest pain , SOB or palpitations she will need to go to the hospital. Pt understanding and agreed.    Otherwise  her exam is normal and there is no focal neuro deficits. Greater than 40 minutes was spent talking with the patient and reassuring her.    Final Clinical Impressions(s) / UC Diagnoses   Final diagnoses:  Chest pain, unspecified type  Essential hypertension  Rash and nonspecific skin eruption     Discharge Instructions     Your EKG and chest x-ray not show anything concerning. We gave you a GI cocktail here in clinic to help with the acid reflux and chest discomfort I am restarting your carvedilol due to the increase in your blood pressure I would like for you to follow-up on Friday for a recheck of your blood pressure If you develop any more severe symptoms between now and then to include worsening chest pain, shortness of breath or dizziness you need to go to the ER. Otherwise follow-up with your primary care provider as needed    ED Prescriptions    Medication Sig Dispense Auth. Provider   carvedilol (COREG) 6.25 MG tablet Take 1 tablet (6.25 mg total) by mouth 2 (two) times daily with a meal. 30 tablet Jaci LazierBast, Taelyr Jantz A, NP     Controlled Substance Prescriptions [place] Controlled Substance Registry consulted? Not Applicable   Janace ArisBast, Trexton Escamilla A, NP 10/10/18 [phone]

## 2018-10-11 ENCOUNTER — Ambulatory Visit (HOSPITAL_COMMUNITY)
Admission: EM | Admit: 2018-10-11 | Discharge: 2018-10-11 | Disposition: A | Discharge status: Home / Self Care | Payer: Self-pay | Attending: Family Medicine | Admitting: Family Medicine

## 2018-10-11 ENCOUNTER — Other Ambulatory Visit: Payer: Self-pay

## 2018-10-11 ENCOUNTER — Encounter (HOSPITAL_COMMUNITY): Payer: Self-pay | Admitting: Emergency Medicine

## 2018-10-11 DIAGNOSIS — I10 Essential (primary) hypertension: Secondary | ICD-10-CM

## 2018-10-11 DIAGNOSIS — F418 Other specified anxiety disorders: Secondary | ICD-10-CM

## 2018-10-11 MED ORDER — ALPRAZOLAM 0.25 MG PO TABS: 0.2500 mg | ORAL_TABLET | Freq: Two times a day (BID) | ORAL | 0 refills | Status: DC | PRN

## 2018-10-11 NOTE — ED Notes (Signed)
Patient able to ambulate independently  

## 2018-10-11 NOTE — Discharge Instructions (Addendum)
Increase your losartan dose to 100mg  daily.  Be aware, the anxiety medication prescribed may cause drowsiness. Please do not drive, operate heavy machinery or make important decisions while on this medication, it may cloud your judgement.

## 2018-10-11 NOTE — ED Triage Notes (Signed)
Pt here for follow up for htn

## 2018-10-14 NOTE — ED Provider Notes (Signed)
[facility]   [id number] 10/11/18 Arrival Time: 1400  ASSESSMENT & PLAN:  1. Uncontrolled hypertension   2. Situational anxiety     Meds ordered this encounter  Medications  . ALPRAZolam (XANAX) 0.25 MG tablet    Sig: Take 1 tablet (0.25 mg total) by mouth 2 (two) times daily as needed for anxiety.    Dispense:  30 tablet    Refill:  0     Discharge Instructions     Increase your losartan dose to 100mg  daily.  Be aware, the anxiety medication prescribed may cause drowsiness. Please do not drive, operate heavy machinery or make important decisions while on this medication, it may cloud your judgement.      Follow-up Information    Schedule an appointment as soon as possible for a visit  with Reva Bores, MD.   Specialty:  Obstetrics and Gynecology Contact information: [address] [place] [address] [phone]        [facility].   Specialty:  Urgent Care Why:  As needed. Contact information: [address] [place] [address] [phone]         May f/u here next week for BP recheck if she desires.  Reviewed expectations re: course of current medical issues. Questions answered. Outlined signs and symptoms indicating need for more acute intervention. Patient verbalized understanding. After Visit Summary given.   SUBJECTIVE:  Carmen [name] is a 58 y.o. female who presents with concerns regarding increased blood pressures. Seen here two days ago; note reviewed.  She reports taking medications as instructed, no medication side effects noted, no chest pain on exertion, no dyspnea on exertion, no swelling of ankles, no orthostatic dizziness or lightheadedness, no orthopnea or paroxysmal nocturnal dyspnea and no palpitations.  Also reports recent feelings of anxiety. "Life stuff going on" but does not care to elaborate specifically. Has taken Xanax in the past. Requests. "Hardly  ever need it. Just to help me when I do." No SI/HI. Activities of daily living without difficulty. Does admit anxiety affects sleep. No illicit drug use or heavy alcohol use reported.  Social History   Tobacco Use  Smoking Status Former Smoker  . Packs/day: 0.25  . Years: 30.00  . Pack years: 7.50  . Types: Cigarettes  . Last attempt to quit: 08/13/2007  . Years since quitting: 11.1  Smokeless Tobacco Never Used  Tobacco Comment   boyfriend smokes    ROS: As per HPI. All other systems negative.    OBJECTIVE:  Vitals:   10/11/18 1416  BP: (!) 187/99  Pulse: 88  Resp: 18  Temp: 98.4 F (36.9 C)  TempSrc: Oral  SpO2: 98%    General appearance: alert; no distress Eyes: PERRLA; EOMI HENT: normocephalic; atraumatic Neck: supple Lungs: clear to auscultation bilaterally Heart: regular rate and rhythm without murmer Abdomen: soft, non-tender; bowel sounds normal Extremities: no edema; symmetrical with no gross deformities Skin: warm and dry Psychological: alert and cooperative; normal mood and affect   Labs Reviewed: Results for orders placed or performed in visit on 08/21/18  Comprehensive metabolic panel  Result Value Ref Range   Glucose 220 (H) 65 - 99 mg/dL   BUN 12 6 - 24 mg/dL   Creatinine, Ser 4.23 0.57 - 1.00 mg/dL   GFR calc non Af Amer 88 >59 mL/min/1.73   GFR calc Af Amer 101 >59 mL/min/1.73   BUN/Creatinine Ratio 16 9 - 23   Sodium  141 134 - 144 mmol/L   Potassium 3.4 (L) 3.5 - 5.2 mmol/L   Chloride 98 96 - 106 mmol/L   CO2 24 20 - 29 mmol/L   Calcium 8.2 (L) 8.7 - 10.2 mg/dL   Total Protein 6.3 6.0 - 8.5 g/dL   Albumin 4.0 3.8 - 4.9 g/dL   Globulin, Total 2.3 1.5 - 4.5 g/dL   Albumin/Globulin Ratio 1.7 1.2 - 2.2   Bilirubin Total 0.4 0.0 - 1.2 mg/dL   Alkaline Phosphatase 70 39 - 117 IU/L   AST 14 0 - 40 IU/L   ALT 19 0 - 32 IU/L  TSH  Result Value Ref Range   TSH 4.530 (H) 0.450 - 4.500 uIU/mL  Lipid panel  Result Value Ref Range    Cholesterol, Total 188 100 - 199 mg/dL   Triglycerides 045 (H) 0 - 149 mg/dL   HDL 50 >40 mg/dL   VLDL Cholesterol Cal 45 (H) 5 - 40 mg/dL   LDL Calculated 93 0 - 99 mg/dL   Chol/HDL Ratio 3.8 0.0 - 4.4 ratio  T4, Free  Result Value Ref Range   Free T4 1.02 0.82 - 1.77 ng/dL  T3, Free  Result Value Ref Range   T3, Free 2.9 2.0 - 4.4 pg/mL  HgB A1c  Result Value Ref Range   Hemoglobin A1C     HbA1c POC (<> result, manual entry)     HbA1c, POC (prediabetic range)     HbA1c, POC (controlled diabetic range) 9.6 (A) 0.0 - 7.0 %    Allergies  Allergen Reactions  . Ace Inhibitors Cough  . Metformin And Related Diarrhea    Able to tolerate XR with only mild symptoms     Past Medical History:  Diagnosis Date  . Adhesive capsulitis of shoulder   . Allergy   . Borderline personality disorder (HCC)   . Carpal tunnel syndrome   . COPD (chronic obstructive pulmonary disease) (HCC)   . Depression   . GERD (gastroesophageal reflux disease)   . Hypertension    Social History   Socioeconomic History  . Marital status: Divorced    Spouse name: Not on file  . Number of children: Not on file  . Years of education: Not on file  . Highest education level: Not on file  Occupational History  . Not on file  Social Needs  . Financial resource strain: Not on file  . Food insecurity:    Worry: Not on file    Inability: Not on file  . Transportation needs:    Medical: Not on file    Non-medical: Not on file  Tobacco Use  . Smoking status: Former Smoker    Packs/day: 0.25    Years: 30.00    Pack years: 7.50    Types: Cigarettes    Last attempt to quit: 08/13/2007    Years since quitting: 11.1  . Smokeless tobacco: Never Used  . Tobacco comment: boyfriend smokes  Substance and Sexual Activity  . Alcohol use: No    Comment: rarely  . Drug use: No  . Sexual activity: Not on file  Lifestyle  . Physical activity:    Days per week: Not on file    Minutes per session: Not on file   . Stress: Not on file  Relationships  . Social connections:    Talks on phone: Not on file    Gets together: Not on file    Attends religious service: Not on file  Active member of club or organization: Not on file    Attends meetings of clubs or organizations: Not on file    Relationship status: Not on file  . Intimate partner violence:    Fear of current or ex partner: Not on file    Emotionally abused: Not on file    Physically abused: Not on file    Forced sexual activity: Not on file  Other Topics Concern  . Not on file  Social History Narrative  . Not on file   Family History  Problem Relation Age of Onset  . Diabetes Mother   . Hypertension Mother    Past Surgical History:  Procedure Laterality Date  . CHOLECYSTECTOMY    . Greig RightNSILLECTOMY        Jessel Gettinger, MD 10/14/18 [phone]

## 2018-10-19 ENCOUNTER — Encounter: Payer: Self-pay | Admitting: Family Medicine

## 2018-10-19 ENCOUNTER — Other Ambulatory Visit: Payer: Self-pay

## 2018-10-19 ENCOUNTER — Ambulatory Visit (INDEPENDENT_AMBULATORY_CARE_PROVIDER_SITE_OTHER): Payer: Self-pay | Admitting: Family Medicine

## 2018-10-19 VITALS — BP 160/84 | HR 81 | Ht 62.0 in

## 2018-10-19 DIAGNOSIS — K219 Gastro-esophageal reflux disease without esophagitis: Secondary | ICD-10-CM

## 2018-10-19 DIAGNOSIS — E119 Type 2 diabetes mellitus without complications: Secondary | ICD-10-CM

## 2018-10-19 DIAGNOSIS — R0981 Nasal congestion: Secondary | ICD-10-CM

## 2018-10-19 DIAGNOSIS — I10 Essential (primary) hypertension: Secondary | ICD-10-CM

## 2018-10-19 MED ORDER — LOSARTAN POTASSIUM 50 MG PO TABS: 50.0000 mg | ORAL_TABLET | Freq: Two times a day (BID) | ORAL | 3 refills | Status: DC

## 2018-10-19 MED ORDER — FLUTICASONE PROPIONATE 50 MCG/ACT NA SUSP: 2.0000 | Freq: Every day | NASAL | 6 refills | Status: DC

## 2018-10-19 MED ORDER — ALUM & MAG HYDROXIDE-SIMETH 400-400-40 MG/5ML PO SUSP: 10.0000 mL | Freq: Four times a day (QID) | ORAL | 0 refills | Status: DC | PRN

## 2018-10-19 NOTE — Assessment & Plan Note (Signed)
Blood pressure was 160/84 today. Continue to take Coreg 6.25mg  twice daily, HCTZ 12.5mg  daily, and the Cozaar/Losartan 50mg  twice daily.

## 2018-10-19 NOTE — Assessment & Plan Note (Signed)
Patient instructed to start taking allergy meds Zyrtec and Nasacort daily.

## 2018-10-19 NOTE — Assessment & Plan Note (Signed)
Patient instructed to keep taking the Metformin 1000mg  (2 tablets) twice daily and the Glimepiride 4mg  every morning. Patient given instructions for diabetes nutritional guidelines.

## 2018-10-19 NOTE — Progress Notes (Signed)
   Subjective:    Patient ID: Carmen [name], female    DOB: [birth year], 58 y.o.   MRN: [id number]   CC: Blood pressure and blood sugar follow-up.  HPI: Patient is a pleasant 58 year old female with history of anxiety, uncontrolled type 2 diabetes and high blood pressure.  Hypertension: Patient recently seen at urgent care for concerns of high blood pressure, patient was asymptomatic but blood pressure at that visit 187/99. Patient was told to start taking losartan 50 mg twice daily.  Patient denies dyspnea on exertion, headaches, vision changes, and chest pain today.  Blood pressure at today's visit 160/84.  Patient reports she has only been taking Coreg once daily.  Type 2 diabetes: last A1c 8.7 > 9.6%. Patient reports taking 2000mg  Metformin at night before bed instead of 1000mg  BID. She reports taking Glimeperide. Refuses to take insulin. Says her food weakness is bread and pastas, continues to eat fatty foods from [employer]. Denies vision changes and neuropathies.  Indigestion: patient has occasional indigestion for which she takes Omeprazole, would like something else. Patient was also instructed to not eat foods late at night before bed time.  Allergies/Nasal Congestion: patient reports not taking her allergy meds and Nasacort daily as prescribed, she was instructed to take her meds daily. Denies itchy/watery eyes, sneezing. Only has nasal congestion.  Smoking status reviewed: non smoker, has 2nd-hand smoke exposure  Review of Systems: negative except for HPO   Objective:  BP (!) 160/84   Pulse 81   Ht 5\' 2"  (1.575 m)   SpO2 98%   BMI 40.24 kg/m  Vitals and nursing note reviewed  General: well nourished, in no acute distress, chatty, obese Cardiac: RRR, clear S1 and S2, no murmurs, rubs, or gallops Respiratory: clear to auscultation bilaterally, no increased work of breathing Abdomen: soft, nontender, nondistended, no masses or organomegaly. Bowel sounds present  Extremities: no edema or cyanosis. Warm, well perfused. 2+ radial and PT pulses bilaterally Neuro: alert and oriented, no focal deficits  Assessment & Plan:   HYPERTENSION, BENIGN SYSTEMIC Blood pressure was 160/84 today. Continue to take Coreg 6.25mg  twice daily, HCTZ 12.5mg  daily, and the Cozaar/Losartan 50mg  twice daily.  GERD Patient instructed to continue to take omeprazole daily.  Was also given prescription for Maalox plus.  Diabetes mellitus without complication (HCC) Patient instructed to keep taking the Metformin 1000mg  (2 tablets) twice daily and the Glimepiride 4mg  every morning. Patient given instructions for diabetes nutritional guidelines.  Nasal congestion Patient instructed to start taking allergy meds Zyrtec and Nasacort daily.   Return if symptoms worsen or fail to improve.  Dr. Peggyann Shoals [facility], PGY-1

## 2018-10-19 NOTE — Assessment & Plan Note (Signed)
Patient instructed to continue to take omeprazole daily.  Was also given prescription for Maalox plus.

## 2018-10-19 NOTE — Patient Instructions (Addendum)
Thank you for coming in to see Carmen [name] today! Please see below to review our plan for today's visit:  1. Your blood pressure was 160/84 today. Continue to take Coreg 6.25mg  twice daily, HCTZ 12.5mg  daily, and the Cozaar/Losartan 50mg  twice daily. 2. Keep taking the Metformin 1000mg  (2 tablets) twice daily and the Glimepiride 4mg  every morning. 3. See the following information about diabetes and nutrition!  Please call the clinic at [phone] if your symptoms worsen or you have any concerns. It was our pleasure to serve you!    Dr. Peggyann Shoals [facility]   Diabetes Mellitus and Nutrition, Adult When you have diabetes (diabetes mellitus), it is very important to have healthy eating habits because your blood sugar (glucose) levels are greatly affected by what you eat and drink. Eating healthy foods in the appropriate amounts, at about the same times every day, can help you:  Control your blood glucose.  Lower your risk of heart disease.  Improve your blood pressure.  Reach or maintain a healthy weight. Every person with diabetes is different, and each person has different needs for a meal plan. Your health care provider may recommend that you work with a diet and nutrition specialist (dietitian) to make a meal plan that is best for you. Your meal plan may vary depending on factors such as:  The calories you need.  The medicines you take.  Your weight.  Your blood glucose, blood pressure, and cholesterol levels.  Your activity level.  Other health conditions you have, such as heart or kidney disease. How do carbohydrates affect me? Carbohydrates, also called carbs, affect your blood glucose level more than any other type of food. Eating carbs naturally raises the amount of glucose in your blood. Carb counting is a method for keeping track of how many carbs you eat. Counting carbs is important to keep your blood glucose at a healthy level, especially if you use  insulin or take certain oral diabetes medicines. It is important to know how many carbs you can safely have in each meal. This is different for every person. Your dietitian can help you calculate how many carbs you should have at each meal and for each snack. Foods that contain carbs include:  Bread, cereal, rice, pasta, and crackers.  Potatoes and corn.  Peas, beans, and lentils.  Milk and yogurt.  Fruit and juice.  Desserts, such as cakes, cookies, ice cream, and candy. How does alcohol affect me? Alcohol can cause a sudden decrease in blood glucose (hypoglycemia), especially if you use insulin or take certain oral diabetes medicines. Hypoglycemia can be a life-threatening condition. Symptoms of hypoglycemia (sleepiness, dizziness, and confusion) are similar to symptoms of having too much alcohol. If your health care provider says that alcohol is safe for you, follow these guidelines:  Limit alcohol intake to no more than 1 drink per day for nonpregnant women and 2 drinks per day for men. One drink equals 12 oz of beer, 5 oz of wine, or 1 oz of hard liquor.  Do not drink on an empty stomach.  Keep yourself hydrated with water, diet soda, or unsweetened iced tea.  Keep in mind that regular soda, juice, and other mixers may contain a lot of sugar and must be counted as carbs. What are tips for following this plan?  Reading food labels  Start by checking the serving size on the "Nutrition Facts" label of packaged foods and drinks. The amount of calories, carbs, fats, and other  nutrients listed on the label is based on one serving of the item. Many items contain more than one serving per package.  Check the total grams (g) of carbs in one serving. You can calculate the number of servings of carbs in one serving by dividing the total carbs by 15. For example, if a food has 30 g of total carbs, it would be equal to 2 servings of carbs.  Check the number of grams (g) of saturated and  trans fats in one serving. Choose foods that have low or no amount of these fats.  Check the number of milligrams (mg) of salt (sodium) in one serving. Most people should limit total sodium intake to less than 2,300 mg per day.  Always check the nutrition information of foods labeled as "low-fat" or "nonfat". These foods may be higher in added sugar or refined carbs and should be avoided.  Talk to your dietitian to identify your daily goals for nutrients listed on the label. Shopping  Avoid buying canned, premade, or processed foods. These foods tend to be high in fat, sodium, and added sugar.  Shop around the outside edge of the grocery store. This includes fresh fruits and vegetables, bulk grains, fresh meats, and fresh dairy. Cooking  Use low-heat cooking methods, such as baking, instead of high-heat cooking methods like deep frying.  Cook using healthy oils, such as olive, canola, or sunflower oil.  Avoid cooking with butter, cream, or high-fat meats. Meal planning  Eat meals and snacks regularly, preferably at the same times every day. Avoid going long periods of time without eating.  Eat foods high in fiber, such as fresh fruits, vegetables, beans, and whole grains. Talk to your dietitian about how many servings of carbs you can eat at each meal.  Eat 4-6 ounces (oz) of lean protein each day, such as lean meat, chicken, fish, eggs, or tofu. One oz of lean protein is equal to: ? 1 oz of meat, chicken, or fish. ? 1 egg. ?  cup of tofu.  Eat some foods each day that contain healthy fats, such as avocado, nuts, seeds, and fish. Lifestyle  Check your blood glucose regularly.  Exercise regularly as told by your health care provider. This may include: ? 150 minutes of moderate-intensity or vigorous-intensity exercise each week. This could be brisk walking, biking, or water aerobics. ? Stretching and doing strength exercises, such as yoga or weightlifting, at least 2 times a week.   Take medicines as told by your health care provider.  Do not use any products that contain nicotine or tobacco, such as cigarettes and e-cigarettes. If you need help quitting, ask your health care provider.  Work with a Veterinary surgeoncounselor or diabetes educator to identify strategies to manage stress and any emotional and social challenges. Questions to ask a health care provider  Do I need to meet with a diabetes educator?  Do I need to meet with a dietitian?  What number can I call if I have questions?  When are the best times to check my blood glucose? Where to find more information:  American Diabetes Association: diabetes.org  Academy of Nutrition and Dietetics: www.eatright.[employer]org  National Institute of Diabetes and Digestive and Kidney Diseases (NIH): CarFlippers.tnwww.niddk.nih.gov Summary  A healthy meal plan will help you control your blood glucose and maintain a healthy lifestyle.  Working with a diet and nutrition specialist (dietitian) can help you make a meal plan that is best for you.  Keep in mind that carbohydrates (  carbs) and alcohol have immediate effects on your blood glucose levels. It is important to count carbs and to use alcohol carefully.

## 2018-10-20 ENCOUNTER — Encounter (HOSPITAL_COMMUNITY): Payer: Self-pay

## 2018-10-20 ENCOUNTER — Other Ambulatory Visit: Payer: Self-pay

## 2018-10-20 ENCOUNTER — Ambulatory Visit (HOSPITAL_COMMUNITY)
Admission: EM | Admit: 2018-10-20 | Discharge: 2018-10-20 | Disposition: A | Discharge status: Home / Self Care | Payer: Self-pay | Attending: Family Medicine | Admitting: Family Medicine

## 2018-10-20 DIAGNOSIS — K219 Gastro-esophageal reflux disease without esophagitis: Secondary | ICD-10-CM

## 2018-10-20 DIAGNOSIS — R030 Elevated blood-pressure reading, without diagnosis of hypertension: Secondary | ICD-10-CM

## 2018-10-20 MED ORDER — ESOMEPRAZOLE MAGNESIUM 20 MG PO PACK: 20.0000 mg | PACK | Freq: Every day | ORAL | 12 refills | Status: DC

## 2018-10-20 MED ORDER — ALUM & MAG HYDROXIDE-SIMETH 200-200-20 MG/5ML PO SUSP
ORAL | Status: AC
  Filled 2018-10-20: qty 30

## 2018-10-20 MED ORDER — LIDOCAINE VISCOUS HCL 2 % MT SOLN
15.0000 mL | Freq: Once | OROMUCOSAL | Status: AC
  Administered 2018-10-20: 10:00:00 15 mL via ORAL

## 2018-10-20 MED ORDER — LIDOCAINE VISCOUS HCL 2 % MT SOLN
OROMUCOSAL | Status: AC
  Filled 2018-10-20: qty 15

## 2018-10-20 MED ORDER — ALUM & MAG HYDROXIDE-SIMETH 200-200-20 MG/5ML PO SUSP
30.0000 mL | Freq: Once | ORAL | Status: AC
  Administered 2018-10-20: 10:00:00 30 mL via ORAL

## 2018-10-20 NOTE — Discharge Instructions (Signed)
May stop omeprazole and start daily Nexium to see if this provides better reflux control.  See Food choice recommendations to help control your reflux.

## 2018-10-20 NOTE — ED Triage Notes (Signed)
Patient presents to Urgent Care with complaints of needing a blood pressure recheck since being instructed to do so before travelling out of the area. Patient reports she has been exerting herself recently.

## 2018-10-20 NOTE — ED Provider Notes (Signed)
[facility]    CSN: [id number] Arrival date & time: 10/20/18  0820     History   Chief Complaint Chief Complaint  Patient presents with  . Blood Pressure Check    HPI Carmen [name] is a 58 y.o. female.   Carmen [name] presents with requests to check her bp as well as reflux with increased belching. She has had issues with this in the past. Has been seen here and received a GI cocktail which helped. Her BP has been elevated recently, with new medication changes. She saw her PCP yesterday and losartan was adjusted to twice a day. Patient states she did take her coreg, hctz and losartan prior to arrival. She states she has had increased stress over the past week and she drank coffee this morning, which she feels attributes to her elevated reading today. She has been taking OTC omeprazole which doesn't always manage her gerd symptoms. She was prescribed Mylanta but she states this is not as effective as the cocktail with lidocaine here in clinic. No arm, jaw pain, no shortness of breath , no dizziness, no nausea.     ROS per HPI, negative if not otherwise mentioned.      Past Medical History:  Diagnosis Date  . Adhesive capsulitis of shoulder   . Allergy   . Borderline personality disorder (HCC)   . Carpal tunnel syndrome   . COPD (chronic obstructive pulmonary disease) (HCC)   . Depression   . GERD (gastroesophageal reflux disease)   . Hypertension     Patient Active Problem List   Diagnosis Date Noted  . Nasal congestion 10/19/2018  . Scabies exposure 10/06/2018  . Contact dermatitis 03/25/2017  . Chronic idiopathic constipation 01/05/2017  . Orthostatic hypotension 12/20/2016  . Vertigo 12/20/2016  . Anxiety 07/20/2016  . Hemorrhoids 11/27/2015  . Left medial knee pain 10/24/2015  . Back pain 02/27/2015  . Osteosclerosis 07/30/2014  . Numbness and tingling in left arm 07/01/2014  . Diabetes mellitus without complication (HCC) 11/15/2012  .  Otomycosis 09/24/2011  . Metabolic syndrome 08/19/2011  . Carpal tunnel syndrome 06/10/2010  . Allergic rhinitis 11/18/2008  . Osteoarthritis of multiple joints 07/30/2008  . Adhesive capsulitis of shoulder 01/15/2008  . Obesity 07/25/2007  . GERD 09/20/2006  . DEPRESSION, MAJOR, RECURRENT 08/04/2006  . BORDERLINE PERSONALITY 08/04/2006  . HYPERTENSION, BENIGN SYSTEMIC 08/04/2006  . Irritable bowel syndrome 08/04/2006    Past Surgical History:  Procedure Laterality Date  . CHOLECYSTECTOMY    . TONSILLECTOMY      OB History   No obstetric history on file.      Home Medications    Prior to Admission medications   Medication Sig Start Date End Date Taking? Authorizing Provider  ALPRAZolam (XANAX) 0.25 MG tablet Take 1 tablet (0.25 mg total) by mouth 2 (two) times daily as needed for anxiety. 10/11/18   Mardella Layman, MD  alum & mag hydroxide-simeth (MAALOX PLUS) 400-400-40 MG/5ML suspension Take 10 mLs by mouth every 6 (six) hours as needed for indigestion. 10/19/18   Dollene Cleveland, DO  aspirin EC 81 MG tablet Take 81 mg by mouth daily.    [provider]  atorvastatin (LIPITOR) 40 MG tablet Take 1 tablet (40 mg total) by mouth 2 (two) times a week. Patient not taking: Reported on 09/14/2018 08/21/18   Reva Bores, MD  carvedilol (COREG) 6.25 MG tablet Take 1 tablet (6.25 mg total) by mouth 2 (two) times daily with a meal.  10/09/18   Dahlia Byes A, NP  cetirizine (ZYRTEC) 10 MG tablet Take 10 mg by mouth daily.    [provider]  esomeprazole (NEXIUM) 20 MG packet Take 20 mg by mouth daily before breakfast. 10/20/18   Georgetta Haber, NP  fluticasone (FLONASE) 50 MCG/ACT nasal spray Place 2 sprays into both nostrils daily. 10/19/18   Dollene Cleveland, DO  glimepiride (AMARYL) 4 MG tablet Take 1 tablet (4 mg total) by mouth daily before breakfast. Patient to finish supply of Glipizide then only take Glimepiride. 08/19/18   Reva Bores, MD  hydrochlorothiazide  (MICROZIDE) 12.5 MG capsule Take 1 capsule (12.5 mg total) by mouth daily. 07/19/18   Reva Bores, MD  losartan (COZAAR) 50 MG tablet Take 1 tablet (50 mg total) by mouth 2 (two) times a day. 10/19/18   Dollene Cleveland, DO  metFORMIN (GLUCOPHAGE-XR) 500 MG 24 hr tablet TAKE TWO TABLETS BY MOUTH TWICE PER DAY 07/24/18   Westley Chandler, MD  triamcinolone (NASACORT ALLERGY 24HR) 55 MCG/ACT AERO nasal inhaler Place 2 sprays into the nose daily.    [provider]  clonazePAM (KLONOPIN) 0.5 MG tablet Take 1 tablet (0.5 mg total) by mouth 2 (two) times daily as needed for anxiety. 01/11/11 08/19/11  Reva Bores, MD  venlafaxine (EFFEXOR) 75 MG tablet Take 1 tablet (75 mg total) by mouth 2 (two) times daily. 03/15/11 08/19/11  Reva Bores, MD    Family History Family History  Problem Relation Age of Onset  . Diabetes Mother   . Hypertension Mother     Social History Social History   Tobacco Use  . Smoking status: Former Smoker    Packs/day: 0.25    Years: 30.00    Pack years: 7.50    Types: Cigarettes    Last attempt to quit: 08/13/2007    Years since quitting: 11.1  . Smokeless tobacco: Never Used  . Tobacco comment: boyfriend smokes  Substance Use Topics  . Alcohol use: No    Comment: rarely  . Drug use: No     Allergies   Ace inhibitors and Metformin and related   Review of Systems Review of Systems   Physical Exam Triage Vital Signs ED Triage Vitals  Enc Vitals Group     BP 10/20/18 0850 (!) 187/90     Pulse Rate 10/20/18 0850 81     Resp 10/20/18 0850 18     Temp 10/20/18 0850 98.1 F (36.7 C)     Temp Source 10/20/18 0850 Oral     SpO2 10/20/18 0850 98 %     Weight --      Height --      Head Circumference --      Peak Flow --      Pain Score 10/20/18 0849 0     Pain Loc --      Pain Edu? --      Excl. in GC? --    No data found.  Updated Vital Signs BP (!) 187/90 (BP Location: Left Arm)   Pulse 81   Temp 98.1 F (36.7 C) (Oral)   Resp  18   SpO2 98%    Physical Exam Constitutional:      General: She is not in acute distress.    Appearance: She is well-developed.  Cardiovascular:     Rate and Rhythm: Normal rate and regular rhythm.     Heart sounds: Normal heart sounds.  Pulmonary:  Effort: Pulmonary effort is normal.     Breath sounds: Normal breath sounds.  Abdominal:     Comments: Belches multiple times during history   Skin:    General: Skin is warm and dry.  Neurological:     Mental Status: She is alert and oriented to person, place, and time.  Psychiatric:        Mood and Affect: Mood normal.     Comments: Patient is difficult to direct with continuous narration of often extraneous information      UC Treatments / Results  Labs (all labs ordered are listed, but only abnormal results are displayed) Labs Reviewed - No data to display  EKG None  Radiology No results found.  Procedures Procedures (including critical care time)  Medications Ordered in UC Medications  alum & mag hydroxide-simeth (MAALOX/MYLANTA) 200-200-20 MG/5ML suspension 30 mL (has no administration in time range)    And  lidocaine (XYLOCAINE) 2 % viscous mouth solution 15 mL (has no administration in time range)    Initial Impression / Assessment and Plan / UC Course  I have reviewed the triage vital signs and the nursing notes.  Pertinent labs & imaging results that were available during my care of the patient were reviewed by me and considered in my medical decision making (see chart for details).     bp is noted to be elevated here today, this appears consistent for patient, however. No indication of ACS at this time. It is difficult to confirm if patient is truly compliant with medication adjustments. It appears that patient does experience underlying anxiety as evidenced by history. Difficult to direct patient, approximately 30 minutes in room with limited information obtained. Gi cocktail provided and will try  nexium to see if this better controls her reflux. Return precautions provided. Encouraged to continue to follow with PCP for management of bp as needed. Patient verbalized understanding and agreeable to plan.   Final Clinical Impressions(s) / UC Diagnoses   Final diagnoses:  Gastroesophageal reflux disease, esophagitis presence not specified  Elevated blood pressure reading     Discharge Instructions     May stop omeprazole and start daily Nexium to see if this provides better reflux control.  See Food choice recommendations to help control your reflux.    ED Prescriptions    Medication Sig Dispense Auth. Provider   esomeprazole (NEXIUM) 20 MG packet Take 20 mg by mouth daily before breakfast. 30 each Georgetta HaberBurky, Kaycie Pegues B, NP     Controlled Substance Prescriptions [place] Controlled Substance Registry consulted? Not Applicable   Georgetta HaberBurky, Ashlen Kiger B, NP 10/20/18 [phone]

## 2018-10-22 ENCOUNTER — Telehealth: Payer: Self-pay | Admitting: Family Medicine

## 2018-10-22 NOTE — Telephone Encounter (Signed)
FPTS After Hours Line  Ms. Liou called the after-hours line because she says "I just do not feel right."  She says that she has been under a lot of stress lately, has had increased allergic symptoms, and has had a rash that she thinks has been from scabies and which has responded to permethrin but has still caused her a lot of stress.  She was recently seen at urgent care, where an EKG was performed which she was told was reassuring.  She has not been able to work which has increased her stress as well.  She is worried about her blood pressure and her blood sugar levels and she thinks these are both higher because of her increased stress levels.  Since her concerns are not emergent, I told patient that I will be happy to make her an appointment to be seen early this week in our clinic.  She says that she had a very good visit with Dr. Manson Passey recently and would like to see her if possible.  I have made an appointment with Dr. Manson Passey on 5/19 at 9:20 AM.  Will forward to Dr. Shawnie Pons, patient's PCP, and Dr. Manson Passey as an Lorain Childes.  Viraj Liby C. Emireth Furbish, MD PGY-2, [facility] 10/22/2018 11:43 AM

## 2018-10-24 ENCOUNTER — Encounter: Payer: Self-pay | Admitting: Family Medicine

## 2018-10-24 ENCOUNTER — Other Ambulatory Visit: Payer: Self-pay

## 2018-10-24 ENCOUNTER — Ambulatory Visit (INDEPENDENT_AMBULATORY_CARE_PROVIDER_SITE_OTHER): Payer: Self-pay | Admitting: Family Medicine

## 2018-10-24 VITALS — BP 152/94 | HR 92 | Wt 208.6 lb

## 2018-10-24 DIAGNOSIS — R05 Cough: Secondary | ICD-10-CM

## 2018-10-24 DIAGNOSIS — E039 Hypothyroidism, unspecified: Secondary | ICD-10-CM

## 2018-10-24 DIAGNOSIS — R053 Chronic cough: Secondary | ICD-10-CM

## 2018-10-24 DIAGNOSIS — I10 Essential (primary) hypertension: Secondary | ICD-10-CM

## 2018-10-24 DIAGNOSIS — R002 Palpitations: Secondary | ICD-10-CM

## 2018-10-24 DIAGNOSIS — E038 Other specified hypothyroidism: Secondary | ICD-10-CM

## 2018-10-24 NOTE — Progress Notes (Signed)
Patient Name: Carmen [name] Date of Birth: Sep 02, 1960 Date of Visit: 10/24/18 PCP: Reva Bores, MD  Chief Complaint: Not feeling right  Subjective: Carmen [name] is a pleasant 58 y.o. with medical history significant for bipolar disorder, anxiety, dyslipidemia, hypertension, former tobacco abuse and poorly controlled type 2 diabetes presenting today for not feeling well.  The patient reports a 101-month history of worsening anxiety fatigue, intermittent burning sensation in the epigastric area, and intermittent in pruritus.The patient has been seen several [facility] at [facility] and several times at Urgent Care for the same complaints. She  Is unsure the cause of her symptoms.  Today she is not sure of her most bothersome symptoms.  She reports she just does not feel right.  She specifically denies chest pain, exertional chest pressure, dyspnea, , fevers, nausea, vomiting, polyuria or polydipsia.  She reports persistent nocturnal cough and morning cough.  She did smoke for many years.  Her current housing situation is less than optimal for her asthma.  She has never had spirometry she reports she feels overwhelming anxiety at work, irritability and significant frustration with most things in life currently.  She is frustrated with her family, her significant other in her work, she works at [employer].  She feels like there is something medically wrong with her but is not sure what it is.  She reports the only one that is helped her is taking her friend's Xanax intermittently.  She was prescribed 15 days worth of Xanax by the emergency room on 6 May.  She is also interested in potentially restarting SNRI therapy in the future.  Previous evaluation included a metabolic panel which showed very mild hypokalemia, TSH which demonstrated subclinical hypothyroidism and elevated A1c.  She has had an EKG which was unchanged from prior per her report and a chest x-ray which is unremarkable on my read.   ROS:  Negative as above.  ROS  I have reviewed the patient's medical, surgical, family, and social history as appropriate.   Vitals:   10/24/18 0924  BP: (!) 152/94  Pulse: 92  SpO2: 98%  BP on repeat with appropriate cuff--- 138/88  HEENT: Sclera anicteric. Dentition is poor. Appears well hydrated. Neck: Supple Cardiac: Regular rate and rhythm. Normal S1/S2- prominent S2.  Lungs: Coarse bilateral breath sounds Abdomen: Obese abdomen. Extremities: Warm, well perfused minimal 1+ edema Skin: Warm, dry there is a superficial scratch on the left arm.  No lesions no petechiae no macules or papules on her forearms or face. Psych: Pleasant and appropriate   Diagnoses and all orders for this visit: Carmen [name] is a pleasant 58 year old woman with a very complicated medical and psychosocial history.  I am uncertain of the exact cause of her underlying symptoms.  She does not have concerning findings and her vital signs are within normal limits including her blood pressure on repeat today.  She has had a multitude of stressors over the last several months.  She continues to struggle with her mental health and has a number of significant medical comorbidities.  We will start with evaluation as below. Differential includes overt hypothyroidism, complications of diabetes, gastroparesis (with intermittent abdominal pain), anemia (reports intermittent palpitations), metabolic derangement. Less likely causes include cardiac disease (reviewed return precautions). Patient is concerned about 'chronic COVID', which I believe is much less likely.  I reviewed emergency department return precautions with her and reasons to call or return to care.  She keeps very detailed notes of her symptoms and  may benefit from frequent contacts with her care providers via virtual visit or in person to better understand her symptoms.  The patient specifically requested Xanax for her symptoms today as this is been the only  alleviating factor for them.  I reviewed the risks of this medication and do not recommend this and  I did not prescribe this medication for her.  Subclinical hypothyroidism -     TSH  HYPERTENSION, BENIGN SYSTEMIC -     Basic Metabolic Panel  Palpitations -     CBC  Chronic cough, likely due to COPD, although differential includes allergies or GERD.  Must also consider more concerning pathology giving her smoking history.  Will start with spirometry.  She is a candidate for low-dose lung CT.    If pruritus continues could consider hepatic panel as well as imaging.  If epigastric pain changes or worsens could consider further cardiac evaluation or further evaluation for gastroparesis.  Pending the above labs will consider restarting an SNRI.  The patient is interested in restarting venlafaxine which is been on her medication list previously.  Terisa Starr, MD  Family Medicine Teaching Service

## 2018-10-24 NOTE — Patient Instructions (Addendum)
   It was wonderful to see you today.  Thank you for choosing [facility].   Please call [phone] with any questions about today's appointment.  Please be sure to schedule follow up at the front  desk before you leave today.   Terisa Starr, MD  Family Medicine     Follow up in 2 weeks to check in   I will call you with labs   Please schedule spirometry with Dr. Raymondo Band

## 2018-10-25 LAB — BASIC METABOLIC PANEL
BUN/Creatinine Ratio: 24 — ABNORMAL HIGH (ref 9–23)
BUN: 18 mg/dL (ref 6–24)
CO2: 26 mmol/L (ref 20–29)
Calcium: 9 mg/dL (ref 8.7–10.2)
Chloride: 92 mmol/L — ABNORMAL LOW (ref 96–106)
Creatinine, Ser: 0.75 mg/dL (ref 0.57–1.00)
GFR calc Af Amer: 103 mL/min/{1.73_m2} (ref >59)
GFR calc non Af Amer: 89 mL/min/{1.73_m2} (ref >59)
Glucose: 204 mg/dL — ABNORMAL HIGH (ref 65–99)
Potassium: 3.2 mmol/L — ABNORMAL LOW (ref 3.5–5.2)
Sodium: 139 mmol/L (ref 134–144)

## 2018-10-25 LAB — CBC
Hematocrit: 39.3 % (ref 34.0–46.6)
Hemoglobin: 13.9 g/dL (ref 11.1–15.9)
MCH: 30.5 pg (ref 26.6–33.0)
MCHC: 35.4 g/dL (ref 31.5–35.7)
MCV: 86 fL (ref 79–97)
Platelets: 271 10*3/uL (ref 150–450)
RBC: 4.56 x10E6/uL (ref 3.77–5.28)
RDW: 12.9 % (ref 11.7–15.4)
WBC: 8.8 10*3/uL (ref 3.4–10.8)

## 2018-10-25 LAB — TSH: TSH: 4.11 u[IU]/mL (ref 0.450–4.500)

## 2018-10-27 ENCOUNTER — Ambulatory Visit (HOSPITAL_COMMUNITY)
Admission: EM | Admit: 2018-10-27 | Discharge: 2018-10-27 | Disposition: A | Discharge status: Home / Self Care | Payer: Self-pay | Attending: Family Medicine | Admitting: Family Medicine

## 2018-10-27 ENCOUNTER — Encounter (HOSPITAL_COMMUNITY): Payer: Self-pay | Admitting: Emergency Medicine

## 2018-10-27 ENCOUNTER — Other Ambulatory Visit: Payer: Self-pay

## 2018-10-27 DIAGNOSIS — L299 Pruritus, unspecified: Secondary | ICD-10-CM

## 2018-10-27 DIAGNOSIS — R5383 Other fatigue: Secondary | ICD-10-CM

## 2018-10-27 NOTE — ED Triage Notes (Addendum)
Pt presents to [facility] for assessment of the "same thing that's been going on for months".  Patient states this in reference to multiple doctors visits in which patient states she has been c/o left sided weakness, itching to her face intermittently (no pattern that she can find), and a "weird feeling inside of me" that she states is indicative that something is 'wrong" with her.  Patient has blood work done at PCP 3 days ago.  When asked what she hopes we will be able to do for he rtoday, patient states "just figure out what's wrong with me".  Pt states she had a viral illness, along with two other family members, and she is convinced it is COVID, and is wondering if she can be tested.  This RN informed her we do not test here.

## 2018-10-27 NOTE — ED Notes (Signed)
Patient able to ambulate independently  

## 2018-10-27 NOTE — ED Provider Notes (Signed)
[facility]    CSN: [id number] Arrival date & time: 10/27/18  1344     History   Chief Complaint Chief Complaint  Patient presents with  . Weakness    HPI Carmen [name] is a 58 y.o. female.   She is presenting with multiple symptoms.  She expresses having itching for several months.  She has been seen several different times.  She reports itching that is generalized.  She has been treated for scabies previously.  Describes a rash that is occurring on her arms and her chest.  Also reports a feeling of fatigue.  Has had lab work recently by her primary doctor.  This did show some subclinical hypothyroidism.  She does have a history of diabetes.  No recent infection.  Review of the chest x-ray from 5/4 showed no active disease.  HPI  Past Medical History:  Diagnosis Date  . Adhesive capsulitis of shoulder   . Allergy   . Borderline personality disorder (HCC)   . Carpal tunnel syndrome   . COPD (chronic obstructive pulmonary disease) (HCC)   . Depression   . GERD (gastroesophageal reflux disease)   . Hypertension   . Type 2 diabetes mellitus [facility](HCC)     Patient Active Problem List   Diagnosis Date Noted  . Contact dermatitis 03/25/2017  . Chronic idiopathic constipation 01/05/2017  . Orthostatic hypotension 12/20/2016  . Anxiety 07/20/2016  . Hemorrhoids 11/27/2015  . Back pain 02/27/2015  . Osteosclerosis 07/30/2014  . Numbness and tingling in left arm 07/01/2014  . Diabetes mellitus without complication (HCC) 11/15/2012  . Otomycosis 09/24/2011  . Metabolic syndrome 08/19/2011  . Carpal tunnel syndrome 06/10/2010  . Allergic rhinitis 11/18/2008  . Osteoarthritis of multiple joints 07/30/2008  . Adhesive capsulitis of shoulder 01/15/2008  . Obesity 07/25/2007  . GERD 09/20/2006  . DEPRESSION, MAJOR, RECURRENT 08/04/2006  . BORDERLINE PERSONALITY 08/04/2006  . HYPERTENSION, BENIGN SYSTEMIC 08/04/2006  . Irritable bowel syndrome 08/04/2006     Past Surgical History:  Procedure Laterality Date  . CHOLECYSTECTOMY    . TONSILLECTOMY      OB History   No obstetric history on file.      Home Medications    Prior to Admission medications   Medication Sig Start Date End Date Taking? Authorizing Provider  ALPRAZolam (XANAX) 0.25 MG tablet Take 1 tablet (0.25 mg total) by mouth 2 (two) times daily as needed for anxiety. 10/11/18   Mardella LaymanHagler, Brian, MD  alum & mag hydroxide-simeth (MAALOX PLUS) 400-400-40 MG/5ML suspension Take 10 mLs by mouth every 6 (six) hours as needed for indigestion. 10/19/18   Dollene ClevelandAnderson, Hannah C, DO  aspirin EC 81 MG tablet Take 81 mg by mouth daily.    [provider]  atorvastatin (LIPITOR) 40 MG tablet Take 1 tablet (40 mg total) by mouth 2 (two) times a week. Patient not taking: Reported on 09/14/2018 08/21/18   Reva BoresPratt, Tanya S, MD  carvedilol (COREG) 6.25 MG tablet Take 1 tablet (6.25 mg total) by mouth 2 (two) times daily with a meal. 10/09/18   Bast, Traci A, NP  cetirizine (ZYRTEC) 10 MG tablet Take 10 mg by mouth daily.    [provider]  esomeprazole (NEXIUM) 20 MG packet Take 20 mg by mouth daily before breakfast. 10/20/18   Georgetta HaberBurky, Natalie B, NP  fluticasone (FLONASE) 50 MCG/ACT nasal spray Place 2 sprays into both nostrils daily. 10/19/18   Dollene ClevelandAnderson, Hannah C, DO  glimepiride (AMARYL) 4 MG tablet Take 1 tablet (  4 mg total) by mouth daily before breakfast. Patient to finish supply of Glipizide then only take Glimepiride. 08/19/18   Reva Bores, MD  hydrochlorothiazide (MICROZIDE) 12.5 MG capsule Take 1 capsule (12.5 mg total) by mouth daily. 07/19/18   Reva Bores, MD  losartan (COZAAR) 50 MG tablet Take 1 tablet (50 mg total) by mouth 2 (two) times a day. 10/19/18   Dollene Cleveland, DO  metFORMIN (GLUCOPHAGE-XR) 500 MG 24 hr tablet TAKE TWO TABLETS BY MOUTH TWICE PER DAY 07/24/18   Westley Chandler, MD  triamcinolone (NASACORT ALLERGY 24HR) 55 MCG/ACT AERO nasal inhaler Place 2 sprays into  the nose daily.    [provider]  clonazePAM (KLONOPIN) 0.5 MG tablet Take 1 tablet (0.5 mg total) by mouth 2 (two) times daily as needed for anxiety. 01/11/11 08/19/11  Reva Bores, MD  venlafaxine (EFFEXOR) 75 MG tablet Take 1 tablet (75 mg total) by mouth 2 (two) times daily. 03/15/11 08/19/11  Reva Bores, MD    Family History Family History  Problem Relation Age of Onset  . Diabetes Mother   . Hypertension Mother     Social History Social History   Tobacco Use  . Smoking status: Former Smoker    Packs/day: 0.25    Years: 30.00    Pack years: 7.50    Types: Cigarettes    Last attempt to quit: 08/13/2007    Years since quitting: 11.2  . Smokeless tobacco: Never Used  . Tobacco comment: boyfriend smokes  Substance Use Topics  . Alcohol use: No    Comment: rarely  . Drug use: No     Allergies   Ace inhibitors and Metformin and related   Review of Systems Review of Systems  Constitutional: Positive for fatigue. Negative for fever.  HENT: Negative for congestion.   Respiratory: Negative for cough.   Cardiovascular: Negative for chest pain.  Gastrointestinal: Negative for abdominal pain.  Musculoskeletal: Positive for arthralgias.  Skin: Positive for rash.  Hematological: Negative for adenopathy.  Psychiatric/Behavioral: The patient is nervous/anxious.      Physical Exam Triage Vital Signs ED Triage Vitals [10/27/18 1357]  Enc Vitals Group     BP (!) 152/119     Pulse Rate 91     Resp 18     Temp 98.3 F (36.8 C)     Temp Source Oral     SpO2 99 %     Weight      Height      Head Circumference      Peak Flow      Pain Score 0     Pain Loc      Pain Edu?      Excl. in [place]?    No data found.  Updated Vital Signs BP (!) 152/119 (BP Location: Right Wrist)   Pulse 91   Temp 98.3 F (36.8 C) (Oral)   Resp 18   SpO2 99%   Visual Acuity Right Eye Distance:   Left Eye Distance:   Bilateral Distance:    Right Eye Near:   Left Eye  Near:    Bilateral Near:     Physical Exam Gen: NAD, alert, cooperative with exam,  ENT: normal lips, normal nasal mucosa,  Eye: normal EOM, normal conjunctiva and lids CV:  no edema, +2 pedal pulses,  Resp: no accessory muscle use, non-labored Skin: no rashes, no areas of induration  Neuro: normal tone, normal sensation to touch Psych:  normal  insight, alert and oriented MSK: Normal gait, normal strength    UC Treatments / Results  Labs (all labs ordered are listed, but only abnormal results are displayed) Labs Reviewed - No data to display  EKG None  Radiology No results found.  Procedures Procedures (including critical care time)  Medications Ordered in UC Medications - No data to display  Initial Impression / Assessment and Plan / UC Course  I have reviewed the triage vital signs and the nursing notes.  Pertinent labs & imaging results that were available during my care of the patient were reviewed by me and considered in my medical decision making (see chart for details).     Arianni is a 58 year old female is presenting with itching and fatigue.  Has had labs recently done by her primary care.  No specific rash today.  Counseled on continuing her course of Nasacort.  Encouraged her to either take Allegra or Xyzal for the itching.  Can take Benadryl at night.  Does have subclinical hypothyroidism and can be followed up by her primary care.  A work note was provided for today.  Given indications to return.  Final Clinical Impressions(s) / UC Diagnoses   Final diagnoses:  Itching  Fatigue, unspecified type     Discharge Instructions     Please try to log your symptoms  Please get regular exercise and eat well.  Please maintain a good sleep hygiene.  Please follow up if your symptoms fail to improve.     ED Prescriptions    None     Controlled Substance Prescriptions [place] Controlled Substance Registry consulted? Not Applicable   Myra Rude, MD  10/27/18 1501

## 2018-10-27 NOTE — Discharge Instructions (Addendum)
Please try to log your symptoms  Please get regular exercise and eat well.  Please maintain a good sleep hygiene.  Please follow up if your symptoms fail to improve.

## 2018-11-02 ENCOUNTER — Ambulatory Visit: Payer: Self-pay | Admitting: Pharmacist

## 2018-11-03 ENCOUNTER — Other Ambulatory Visit: Payer: Self-pay

## 2018-11-03 ENCOUNTER — Telehealth (INDEPENDENT_AMBULATORY_CARE_PROVIDER_SITE_OTHER): Payer: Self-pay | Admitting: Family Medicine

## 2018-11-03 DIAGNOSIS — L259 Unspecified contact dermatitis, unspecified cause: Secondary | ICD-10-CM

## 2018-11-03 DIAGNOSIS — E119 Type 2 diabetes mellitus without complications: Secondary | ICD-10-CM

## 2018-11-03 NOTE — Progress Notes (Signed)
Called patient to follow-up regarding her recent vision during which time she had a number of concerns and had been seen in the ED for not feeling right.  The patient reports that she is doing much better.  She has no questions or concerns today.  She has been working all week.  She is doing well.  She is now traveling to her boyfriend's house.  She is aware of her upcoming appoint with Dr. Shawnie Pons.  She does admit to some dietary indiscretions including blueberry pancake with cream cheese, rice crispy treats and a number of other items.  We discussed reducing carbohydrates during each meal eating appropriate portions and opportunities for reducing calorie load.  All questions were answered.  She knows she has follow-up with Dr. Shawnie Pons this upcoming week.  Terisa Starr, MD  Family Medicine Teaching Service   No charge as no concerns, has appointment in <7 days.

## 2018-11-08 ENCOUNTER — Ambulatory Visit: Payer: Self-pay | Admitting: Family Medicine

## 2018-11-24 ENCOUNTER — Other Ambulatory Visit: Payer: Self-pay

## 2018-11-27 MED ORDER — CARVEDILOL 6.25 MG PO TABS: 6.2500 mg | ORAL_TABLET | Freq: Two times a day (BID) | ORAL | 3 refills | Status: DC

## 2018-12-17 ENCOUNTER — Encounter (HOSPITAL_COMMUNITY): Payer: Self-pay

## 2018-12-17 ENCOUNTER — Other Ambulatory Visit: Payer: Self-pay

## 2018-12-17 ENCOUNTER — Ambulatory Visit (HOSPITAL_COMMUNITY)
Admission: EM | Admit: 2018-12-17 | Discharge: 2018-12-17 | Disposition: A | Discharge status: Home / Self Care | Payer: Self-pay | Attending: Physician Assistant | Admitting: Physician Assistant

## 2018-12-17 DIAGNOSIS — R5383 Other fatigue: Secondary | ICD-10-CM

## 2018-12-17 LAB — POCT URINALYSIS DIP (DEVICE)
Bilirubin Urine: NEGATIVE
Glucose, UA: 500 mg/dL — AB
Hgb urine dipstick: NEGATIVE
Ketones, ur: NEGATIVE mg/dL
Leukocytes,Ua: NEGATIVE
Nitrite: NEGATIVE
Protein, ur: 100 mg/dL — AB
Specific Gravity, Urine: >=1.03 (ref 1.005–1.030)
Urobilinogen, UA: 0.2 mg/dL (ref 0.0–1.0)
pH: 5 (ref 5.0–8.0)

## 2018-12-17 LAB — GLUCOSE, CAPILLARY: Glucose-Capillary: 172 mg/dL — ABNORMAL HIGH (ref 70–99)

## 2018-12-17 MED ORDER — AZELASTINE HCL 0.15 % NA SOLN: 1.0000 | Freq: Every day | NASAL | 0 refills | Status: DC

## 2018-12-17 NOTE — ED Provider Notes (Signed)
[facility]    CSN: [id number] Arrival date & time: 12/17/18  1536     History   Chief Complaint Chief Complaint  Patient presents with  . Hyperglycemia  . Fatigue    HPI Carmen [name] is a 58 y.o. female.   58 year old female with history of diabetes, COPD, hypertension comes in for evaluation of few day history of fatigue and possibly elevated blood sugar.  She states she has been told she might need insulin, but refuses.  Metformin causes diarrhea, but given she does not have insurance, she continues to take as it is the only other medicine she can afford.  At this time she takes metformin 2000 mg nightly, glimepiride 4 mg every morning.  She does not check her blood sugars at home.  She states for the past few days, she has been eating out, having [place] toast, drinking lemonade/soda.  She went home yesterday and has felt increased fatigue, and wonders if her blood sugar is elevated.  She has nausea without vomiting.  Denies abdominal pain.  Has baseline diarrhea due to metformin but has not worsened.  Denies fever, chills, night sweats.  Denies URI symptoms such as cough, congestion, sore throat.  Denies urinary symptoms such as frequency, dysuria, hematuria. DM is uncontrolled, last a1c 9.6     Past Medical History:  Diagnosis Date  . Adhesive capsulitis of shoulder   . Allergy   . Borderline personality disorder (HCC)   . Carpal tunnel syndrome   . COPD (chronic obstructive pulmonary disease) (HCC)   . Depression   . GERD (gastroesophageal reflux disease)   . Hypertension   . Type 2 diabetes mellitus [facility](HCC)     Patient Active Problem List   Diagnosis Date Noted  . Contact dermatitis 03/25/2017  . Chronic idiopathic constipation 01/05/2017  . Orthostatic hypotension 12/20/2016  . Anxiety 07/20/2016  . Hemorrhoids 11/27/2015  . Back pain 02/27/2015  . Osteosclerosis 07/30/2014  . Numbness and tingling in left arm 07/01/2014  . Diabetes mellitus  without complication (HCC) 11/15/2012  . Otomycosis 09/24/2011  . Metabolic syndrome 08/19/2011  . Carpal tunnel syndrome 06/10/2010  . Allergic rhinitis 11/18/2008  . Osteoarthritis of multiple joints 07/30/2008  . Adhesive capsulitis of shoulder 01/15/2008  . Obesity 07/25/2007  . GERD 09/20/2006  . DEPRESSION, MAJOR, RECURRENT 08/04/2006  . BORDERLINE PERSONALITY 08/04/2006  . HYPERTENSION, BENIGN SYSTEMIC 08/04/2006  . Irritable bowel syndrome 08/04/2006    Past Surgical History:  Procedure Laterality Date  . CHOLECYSTECTOMY    . TONSILLECTOMY      OB History   No obstetric history on file.      Home Medications    Prior to Admission medications   Medication Sig Start Date End Date Taking? Authorizing Provider  ALPRAZolam (XANAX) 0.25 MG tablet Take 1 tablet (0.25 mg total) by mouth 2 (two) times daily as needed for anxiety. 10/11/18   Mardella LaymanHagler, Brian, MD  alum & mag hydroxide-simeth (MAALOX PLUS) 400-400-40 MG/5ML suspension Take 10 mLs by mouth every 6 (six) hours as needed for indigestion. 10/19/18   Dollene ClevelandAnderson, Hannah C, DO  aspirin EC 81 MG tablet Take 81 mg by mouth daily.    [provider]  atorvastatin (LIPITOR) 40 MG tablet Take 1 tablet (40 mg total) by mouth 2 (two) times a week. Patient not taking: Reported on 09/14/2018 08/21/18   Reva BoresPratt, Tanya S, MD  Azelastine HCl 0.15 % SOLN Place 1 spray into the nose daily. 12/17/18   Cathie HoopsYu,   V, PA-C  carvedilol (COREG) 6.25 MG tablet Take 1 tablet (6.25 mg total) by mouth 2 (two) times daily with a meal. 11/27/18   Donnamae Jude, MD  cetirizine (ZYRTEC) 10 MG tablet Take 10 mg by mouth daily.    [provider]  esomeprazole (NEXIUM) 20 MG packet Take 20 mg by mouth daily before breakfast. 10/20/18   Zigmund Gottron, NP  fluticasone (FLONASE) 50 MCG/ACT nasal spray Place 2 sprays into both nostrils daily. 10/19/18   Daisy Floro, DO  glimepiride (AMARYL) 4 MG tablet Take 1 tablet (4 mg total) by mouth daily  before breakfast. Patient to finish supply of Glipizide then only take Glimepiride. 08/19/18   Donnamae Jude, MD  hydrochlorothiazide (MICROZIDE) 12.5 MG capsule Take 1 capsule (12.5 mg total) by mouth daily. 07/19/18   Donnamae Jude, MD  losartan (COZAAR) 50 MG tablet Take 1 tablet (50 mg total) by mouth 2 (two) times a day. 10/19/18   Daisy Floro, DO  metFORMIN (GLUCOPHAGE-XR) 500 MG 24 hr tablet TAKE TWO TABLETS BY MOUTH TWICE PER DAY 07/24/18   Martyn Malay, MD  triamcinolone (NASACORT ALLERGY 24HR) 55 MCG/ACT AERO nasal inhaler Place 2 sprays into the nose daily.    [provider]  clonazePAM (KLONOPIN) 0.5 MG tablet Take 1 tablet (0.5 mg total) by mouth 2 (two) times daily as needed for anxiety. 01/11/11 08/19/11  Donnamae Jude, MD  venlafaxine (EFFEXOR) 75 MG tablet Take 1 tablet (75 mg total) by mouth 2 (two) times daily. 03/15/11 08/19/11  Donnamae Jude, MD    Family History Family History  Problem Relation Age of Onset  . Diabetes Mother   . Hypertension Mother     Social History Social History   Tobacco Use  . Smoking status: Former Smoker    Packs/day: 0.25    Years: 30.00    Pack years: 7.50    Types: Cigarettes    Quit date: 08/13/2007    Years since quitting: 11.3  . Smokeless tobacco: Never Used  . Tobacco comment: boyfriend smokes  Substance Use Topics  . Alcohol use: No    Comment: rarely  . Drug use: No     Allergies   Ace inhibitors and Metformin and related   Review of Systems Review of Systems  Reason unable to perform ROS: See HPI as above.     Physical Exam Triage Vital Signs ED Triage Vitals  Enc Vitals Group     BP 12/17/18 1624 (!) 161/80     Pulse Rate 12/17/18 1620 83     Resp 12/17/18 1624 18     Temp 12/17/18 1620 98.4 F (36.9 C)     Temp Source 12/17/18 1620 Oral     SpO2 12/17/18 1624 100 %     Weight --      Height --      Head Circumference --      Peak Flow --      Pain Score 12/17/18 1624 0     Pain Loc  --      Pain Edu? --      Excl. in [place]? --    No data found.  Updated Vital Signs BP (!) 161/80 (BP Location: Left Wrist)   Pulse 81   Temp 98.4 F (36.9 C) (Oral)   Resp 18   SpO2 100%   Physical Exam Constitutional:      General: She is not in acute distress.  Appearance: She is well-developed. She is not ill-appearing, toxic-appearing or diaphoretic.  HENT:     Head: Normocephalic and atraumatic.  Eyes:     Conjunctiva/sclera: Conjunctivae normal.     Pupils: Pupils are equal, round, and reactive to light.  Cardiovascular:     Rate and Rhythm: Normal rate and regular rhythm.     Heart sounds: Normal heart sounds. No murmur. No friction rub. No gallop.   Pulmonary:     Effort: Pulmonary effort is normal.     Breath sounds: Normal breath sounds. No wheezing or rales.  Abdominal:     General: Bowel sounds are normal.     Palpations: Abdomen is soft.     Tenderness: There is no abdominal tenderness. There is no right CVA tenderness, left CVA tenderness, guarding or rebound.  Skin:    General: Skin is warm and dry.  Neurological:     Mental Status: She is alert and oriented to person, place, and time.     GCS: GCS eye subscore is 4. GCS verbal subscore is 5. GCS motor subscore is 6.     Coordination: Coordination is intact.     Gait: Gait is intact.  Psychiatric:        Behavior: Behavior normal.        Judgment: Judgment normal.      UC Treatments / Results  Labs (all labs ordered are listed, but only abnormal results are displayed) Labs Reviewed  GLUCOSE, CAPILLARY - Abnormal; Notable for the following components:      Result Value   Glucose-Capillary 172 (*)    All other components within normal limits  POCT URINALYSIS DIP (DEVICE) - Abnormal; Notable for the following components:   Glucose, UA 500 (*)    Protein, ur 100 (*)    All other components within normal limits  CBG MONITORING, ED    EKG   Radiology No results found.  Procedures  Procedures (including critical care time)  Medications Ordered in UC Medications - No data to display  Initial Impression / Assessment and Plan / UC Course  I have reviewed the triage vital signs and the nursing notes.  Pertinent labs & imaging results that were available during my care of the patient were reviewed by me and considered in my medical decision making (see chart for details).    No alarming signs on exam.  Urine without infection.  Patient with no focal deficits, able to ambulate on own without difficulty.  No dizziness, syncope.  CBG 172.  At this time, will have patient push fluids, and contact PCP tomorrow morning for further evaluation and management needed.  Return precautions given.  Patient brought up that she has some sinus pressure.  She denies fever, chills, night sweats.  Denies cough, shortness of breath.  Will have patient continue Flonase and Zyrtec.  We will add on azelastine to help with symptoms.  Again to follow-up with PCP for further evaluation if symptoms not improving.  Final Clinical Impressions(s) / UC Diagnoses   Final diagnoses:  Fatigue, unspecified type    ED Prescriptions    Medication Sig Dispense Auth. Provider   Azelastine HCl 0.15 % SOLN Place 1 spray into the nose daily. 30 mL Threasa Alpha,  V, PA-C        ,  V, [place]-C 12/17/18 1826

## 2018-12-17 NOTE — Discharge Instructions (Addendum)
No alarming signs on exam right now.  Your urine was negative for infection.  You can continue Flonase and Zyrtec to help with sinus pressure.  You can also try azelastine to help with symptoms.  At this time, your blood sugar is 172, which is not in a dangerous range.  Please stay hydrated, your urine should be clear to pale yellow in color.  Please call your PCP tomorrow for further evaluation and management of fatigue.  If overnight you are experiencing worsening symptoms, confusion, dizziness, passing out, fever, go to the emergency department for further evaluation.

## 2018-12-17 NOTE — ED Triage Notes (Signed)
Pt C/o fatigue and elevated blood sugar. Pt states that her Blood sugar has been increased the last couple days

## 2018-12-19 ENCOUNTER — Other Ambulatory Visit: Payer: Self-pay

## 2018-12-19 ENCOUNTER — Telehealth (INDEPENDENT_AMBULATORY_CARE_PROVIDER_SITE_OTHER): Payer: Self-pay | Admitting: Family Medicine

## 2018-12-19 DIAGNOSIS — R5383 Other fatigue: Secondary | ICD-10-CM

## 2018-12-19 NOTE — Progress Notes (Signed)
[facility]  Patient consented to have virtual visit. Method of visit: Telephone  Encounter participants: Patient: Carmen [name] - located at home Provider: Lind Covert - located at office Others (if applicable): no  Chief Complaint: Fatigue  HPI:  Has felt very tired for last 4 days.  Seen at [facility] on 2 days ago.  Normal exam and urine and blood sugar 170.  No fever or shortness of breath or cough or rash.  Her blood sugar have been in 200s.  No nausea and vomiting Taking all her medications except lipitor Boyfriend has something similar and was covid -.  She was not tested   ROS: per HPI  Pertinent PMHx: diabetes not controlled, hypertension   Exam:  Respiratory: normal speech Psych:  Cognition and judgment appear intact. Alert, communicative  and cooperative with normal attention span and concentration. No apparent delusions, illusions, hallucinations   Assessment/Plan:  Fatigue For last 5 days.  No signs of bacterial infection or hyperosmolar.  Likely viral infection no other signs of Covid.  Suggested continue current medications and rest.  If not better in 3-5 days or if worsening then should come in for a visit.  She agrees     Time spent during visit with patient: 12 minutes

## 2018-12-19 NOTE — Assessment & Plan Note (Signed)
For last 5 days.  No signs of bacterial infection or hyperosmolar.  Likely viral infection no other signs of Covid.  Suggested continue current medications and rest.  If not better in 3-5 days or if worsening then should come in for a visit.  She agrees

## 2019-01-01 NOTE — Progress Notes (Signed)
Subjective  Carmen [name] is a 58 y.o. female is presenting with the following  Chief Complaint noted Review of Symptoms - see HPI PMH - Smoking status noted.    Objective Vital Signs reviewed There were no vitals taken for this visit.  Assessments/Plans  See after visit summary for details of patient instuctions  Fatigue For last 5 days.  No signs of bacterial infection or hyperosmolar.  Likely viral infection no other signs of Covid.  Suggested continue current medications and rest.  If not better in 3-5 days or if worsening then should come in for a visit.  She agrees

## 2019-01-11 ENCOUNTER — Other Ambulatory Visit: Payer: Self-pay

## 2019-01-11 ENCOUNTER — Telehealth (INDEPENDENT_AMBULATORY_CARE_PROVIDER_SITE_OTHER): Payer: Self-pay | Admitting: Family Medicine

## 2019-01-11 DIAGNOSIS — E1169 Type 2 diabetes mellitus with other specified complication: Secondary | ICD-10-CM

## 2019-01-11 DIAGNOSIS — E669 Obesity, unspecified: Secondary | ICD-10-CM

## 2019-01-12 NOTE — Progress Notes (Signed)
[facility] Visit  Patient consented to have virtual visit. Method of visit: Telephone  Encounter participants: Patient: Carmen [name] - located at home Provider: Bonnita Hollow - located at office Others (if applicable): Not applicable  Chief Complaint: Elevated CBGs, feet are hurting  HPI: Carmen [name] is a 58 y.o. female with past medical history of diabetes mellitus type 2 who presents with elevated CBGs and pain in her feet.  Patient states that she has had trouble controlling her diet endorses eating sweets, extra carbohydrates.  Says that her a.m. fasting CBGs have been in the 200s.  Patient reports noncompliance on her metformin, but reports compliance on her glipizide.  Last A1c was 9.6 on 08/21/2018.  Patient takes metformin 500 mg XR twice daily, glipizide 4 mg daily.  Patient is resistant to starting insulin.  Patient also says that she is on a very limited budget and cannot afford other medications such as Ozempic or other diabetes agents.  Patient also reports having pain in her feet over the past several weeks in the bottom peripheral part of her feet.  Describes it as numbness type feeling and sometimes as an electrical type pain.  Patient has not tried anything for the pain.   ROS: per HPI  Pertinent PMHx: Diabetes mellitus type 2  Exam:  Respiratory: Able to speak in full sentences without issue Psych: Coherent speech  Assessment/Plan: Uncontrolled diabetes Patient having difficulty maintaining compliance on diet and medication regimen.  Also not been to the clinic to have her A1c checked and approximately 5 months.  Recommend increasing glipizide from 4 mg daily to 4 mg twice daily for better glycemic control.  Recommend patient come in to have A1c checked as it is been noted while, and also recommend patient make an appointment with PCP to discuss ongoing diabetes management in 1 month.  Discussed signs of hypoglycemia.   Discussed with patient that given her cost constraints, may need to have insulin added to regimen in order to have better glycemic control.  Foot pain Occurs bilaterally, sounds consistent with diabetic peripheral neuropathy.  Recommend patient come in at her next PCP visit for diabetic foot exam in clinic for further treatment options at this time. Time spent during visit with patient: 15 minutes

## 2019-01-23 ENCOUNTER — Ambulatory Visit (INDEPENDENT_AMBULATORY_CARE_PROVIDER_SITE_OTHER): Payer: Self-pay | Admitting: Family Medicine

## 2019-01-23 ENCOUNTER — Other Ambulatory Visit: Payer: Self-pay

## 2019-01-23 ENCOUNTER — Encounter: Payer: Self-pay | Admitting: Family Medicine

## 2019-01-23 VITALS — BP 134/90 | HR 106 | Wt 210.0 lb

## 2019-01-23 DIAGNOSIS — I10 Essential (primary) hypertension: Secondary | ICD-10-CM

## 2019-01-23 DIAGNOSIS — E119 Type 2 diabetes mellitus without complications: Secondary | ICD-10-CM

## 2019-01-23 DIAGNOSIS — R202 Paresthesia of skin: Secondary | ICD-10-CM

## 2019-01-23 DIAGNOSIS — E038 Other specified hypothyroidism: Secondary | ICD-10-CM | POA: Insufficient documentation

## 2019-01-23 DIAGNOSIS — E039 Hypothyroidism, unspecified: Secondary | ICD-10-CM

## 2019-01-23 LAB — POCT GLYCOSYLATED HEMOGLOBIN (HGB A1C): HbA1c, POC (controlled diabetic range): 8.6 % — AB (ref 0.0–7.0)

## 2019-01-23 LAB — POCT UA - MICROALBUMIN
Albumin/Creatinine Ratio, Urine, POC: >300
Creatinine, POC: 100 mg/dL
Microalbumin Ur, POC: 150 mg/L

## 2019-01-23 MED ORDER — GLIMEPIRIDE 4 MG PO TABS: 4.0000 mg | ORAL_TABLET | Freq: Two times a day (BID) | ORAL | 3 refills | Status: DC

## 2019-01-23 MED ORDER — METFORMIN HCL ER 500 MG PO TB24: ORAL_TABLET | ORAL | 5 refills | Status: DC

## 2019-01-23 NOTE — Assessment & Plan Note (Signed)
Re-check her thyroid labs.

## 2019-01-23 NOTE — Assessment & Plan Note (Signed)
BP is well controlled on Carvedilol

## 2019-01-23 NOTE — Assessment & Plan Note (Addendum)
Improved control on Metformin and recent Amaryl increase. Needs eye exam. Risks of poorly controlled diabetes reviewed. Normal foot exam, and symptoms are unlikely to be neuropathy.

## 2019-01-23 NOTE — Progress Notes (Signed)
   Subjective:    Patient ID: Carmen [name] is a 58 y.o. female presenting with Diabetes and foot tingling  on 01/23/2019  HPI: Wants a COVID antibody test due to illness in February and April.  Increased her Amaryl. Is not following an appropriate diet. Has not had, nor can she afford an optho exam. May try to get one locally to her. Burning sensation in her legs has improved. Has a bit of a rash. Notes she feels a prickling sensation in her chest, face, legs, arms, torso and this is causing her to have trouble sleeping at night.  Review of Systems  Constitutional: Negative for chills and fever.  Respiratory: Negative for shortness of breath.   Cardiovascular: Negative for chest pain.  Gastrointestinal: Negative for abdominal pain, nausea and vomiting.  Genitourinary: Negative for dysuria.  Skin: Negative for rash.      Objective:    BP 134/90   Pulse (!) 106   Wt 210 lb (95.3 kg)   SpO2 98%   BMI 38.41 kg/m  Physical Exam Constitutional:      General: She is not in acute distress.    Appearance: She is well-developed.  HENT:     Head: Normocephalic and atraumatic.  Eyes:     General: No scleral icterus. Neck:     Musculoskeletal: Neck supple.  Cardiovascular:     Rate and Rhythm: Normal rate.  Pulmonary:     Effort: Pulmonary effort is normal.  Abdominal:     Palpations: Abdomen is soft.  Skin:    General: Skin is warm and dry.  Neurological:     Mental Status: She is alert and oriented to person, place, and time.     Sensory: Sensation is intact.    Diabetic foot exam was performed with the following findings:   No deformities, ulcerations, or other skin breakdown Normal sensation of 10g monofilament Intact posterior tibialis and dorsalis pedis pulses        Assessment & Plan:   Problem List Items Addressed This Visit      High   HYPERTENSION, BENIGN SYSTEMIC (Chronic)    BP is well controlled on Carvedilol      Relevant Orders   Basic  Metabolic Panel   Diabetes mellitus without complication (HCC) - Primary (Chronic)    Improved control on Metformin and recent Amaryl increase. Needs eye exam. Risks of poorly controlled diabetes reviewed. Normal foot exam, and symptoms are unlikely to be neuropathy.      Relevant Medications   glimepiride (AMARYL) 4 MG tablet   metFORMIN (GLUCOPHAGE-XR) 500 MG 24 hr tablet   Other Relevant Orders   HgB A1c (Completed)   POCT UA - Microalbumin (Completed)   Basic Metabolic Panel     Unprioritized   Subclinical hypothyroidism    Re-check her thyroid labs.      Relevant Orders   TSH   T3, free   T4, free    Other Visit Diagnoses    Prickling sensation       Unclear etiology--to try OTC restless leg med.      Total face-to-face time with patient: 25 minutes. Over 50% of encounter was spent on counseling and coordination of care. Return in about 3 months (around 04/25/2019) for pap and annual exam.  Donnamae Jude 01/23/2019 1:57 PM

## 2019-01-23 NOTE — Patient Instructions (Signed)

## 2019-01-24 ENCOUNTER — Encounter: Payer: Self-pay | Admitting: Family Medicine

## 2019-01-24 LAB — BASIC METABOLIC PANEL
BUN/Creatinine Ratio: 28 — ABNORMAL HIGH (ref 9–23)
BUN: 16 mg/dL (ref 6–24)
CO2: 26 mmol/L (ref 20–29)
Calcium: 8.6 mg/dL — ABNORMAL LOW (ref 8.7–10.2)
Chloride: 102 mmol/L (ref 96–106)
Creatinine, Ser: 0.57 mg/dL (ref 0.57–1.00)
GFR calc Af Amer: 120 mL/min/{1.73_m2} (ref >59)
GFR calc non Af Amer: 104 mL/min/{1.73_m2} (ref >59)
Glucose: 190 mg/dL — ABNORMAL HIGH (ref 65–99)
Potassium: 3.1 mmol/L — ABNORMAL LOW (ref 3.5–5.2)
Sodium: 144 mmol/L (ref 134–144)

## 2019-01-24 LAB — T4, FREE: Free T4: 1.06 ng/dL (ref 0.82–1.77)

## 2019-01-24 LAB — TSH: TSH: 7.17 u[IU]/mL — ABNORMAL HIGH (ref 0.450–4.500)

## 2019-01-24 LAB — T3, FREE: T3, Free: 3.4 pg/mL (ref 2.0–4.4)

## 2019-01-24 MED ORDER — POTASSIUM CHLORIDE CRYS ER 20 MEQ PO TBCR: 40.0000 meq | EXTENDED_RELEASE_TABLET | Freq: Every day | ORAL | 1 refills | Status: DC

## 2019-01-29 ENCOUNTER — Telehealth: Payer: Self-pay | Admitting: Family Medicine

## 2019-01-29 ENCOUNTER — Ambulatory Visit (INDEPENDENT_AMBULATORY_CARE_PROVIDER_SITE_OTHER): Payer: Self-pay | Admitting: Family Medicine

## 2019-01-29 ENCOUNTER — Other Ambulatory Visit: Payer: Self-pay

## 2019-01-29 ENCOUNTER — Encounter: Payer: Self-pay | Admitting: Family Medicine

## 2019-01-29 VITALS — BP 130/80 | HR 92

## 2019-01-29 DIAGNOSIS — N898 Other specified noninflammatory disorders of vagina: Secondary | ICD-10-CM

## 2019-01-29 DIAGNOSIS — Z1159 Encounter for screening for other viral diseases: Secondary | ICD-10-CM

## 2019-01-29 DIAGNOSIS — I10 Essential (primary) hypertension: Secondary | ICD-10-CM

## 2019-01-29 DIAGNOSIS — G6289 Other specified polyneuropathies: Secondary | ICD-10-CM

## 2019-01-29 DIAGNOSIS — E119 Type 2 diabetes mellitus without complications: Secondary | ICD-10-CM

## 2019-01-29 LAB — POCT WET PREP (WET MOUNT)
Clue Cells Wet Prep Whiff POC: NEGATIVE
Trichomonas Wet Prep HPF POC: ABSENT

## 2019-01-29 LAB — POCT UA - MICROALBUMIN
Albumin/Creatinine Ratio, Urine, POC: >300
Creatinine, POC: 50 mg/dL
Microalbumin Ur, POC: 150 mg/L

## 2019-01-29 NOTE — Patient Instructions (Addendum)
It was wonderful to see you today.  Thank you for choosing [facility].   Please call [phone] with any questions about today's appointment.  Please be sure to schedule follow up at the front  desk before you leave today.   Dorris Singh, MD  Family Medicine   Your pelvic exam was normal.  I will call you with the results of your urine and vaginal testing   I will call you with the results of your labs in the next 1 to 2 days  Please increase the amount of potassium you are eating  We will continue to discuss your diabetes control it is better than prior.  Continue to work on [employer] as you are doing a really great job.

## 2019-01-29 NOTE — Telephone Encounter (Signed)
Pt is returning a missed call from our office. She said she thinks it was Dr. Owens Shark calling to discuss her appointment today and the results.   I did not see anything noted of anyone calling the patient.   Pt would like someone to call her back with results.

## 2019-01-29 NOTE — Assessment & Plan Note (Signed)
Patient is currently on glimepiride and maximal dose metformin therapy.  Could consider transition to glipizide as this is often better tolerated and associated with last hypoglycemia.  Patient referred to Dr. Graylin Shiver clinic for further evaluation.  The patient has no insurance which makes prescribing other agents difficult.  Her most recent A1c is 8.6 which is improved from prior.  Could consider SGLT2 inhibitor particularly in light of her albuminuria.

## 2019-01-29 NOTE — Telephone Encounter (Signed)
Will forward to Dr. Owens Shark to see if she reached out to patient today regarding lab results. Aycen Porreca,CMA

## 2019-01-29 NOTE — Progress Notes (Signed)
Patient Name: Carmen ComesFrances B Thurman Date of Birth: 04/08/1961 Date of Visit: 01/29/19 PCP: Reva BoresPratt, Tanya S, MD  Chief Complaint: Sensations in feet memory concerns and questions about her blood work  Subjective: Carmen [name] is a pleasant 58 y.o. with medical history significant for type 2 diabetes, hypertension, irritable bowel syndrome, and depression presenting today for multiple concerns.  Intermittent sensations and pain The patient reports a several month history of intermittent stabbing and prickling sensations in various parts of her body.  This is located along the dorsum of her feet as a stabbing sensation.  She also gets a sensation in her left side at times.  The tingling in prickly sensation also goes from her toes to her head several times per day.  She cannot identify any trigger.  She has no associated rash.  She does not identify any foods that could be triggers.  She reports that her legs act up at night and bother her and want to move all the time, consistent with her restless like she reports.  She denies any difficulty with speaking.  She does report some difficulty focusing her attention at times.  Particularly she notices problems with remembering to do specific tasks.  She does note 1 or 2 instances of word finding difficulty.  She had a normal monofilament exam several weeks ago.  Vaginal discharge The patient reports a several day history of white vaginal discharge.  She thinks she has yeast infection.  She notes some mild odor to this. She is sexually active with 1 partner, declines STI testing. Her last Pap was in 2017, NILM HPV negative.   Social Stressors The patient reports her best friend is getting married in October, she is under stress from planus.  She reports her job is asking for a letter to support her having water while she works.    ROS: Per HPI.   I have reviewed the patient's medical, surgical, family, and social history as appropriate.  Vitals:    01/29/19 1044  BP: 130/80  Pulse: 92  SpO2: 97%    HEENT: Sclera anicteric. Dentition is moderate. Appears well hydrated.  Ears examined.  No abnormalities.  TMs clear neck: Supple Cardiac: Regular rate and rhythm. Normal S1/S2. No murmurs, rubs, or gallops appreciated. Lungs: Clear bilaterally to ascultation.  GU Exam:  Chaperoned exam.  External exam: Normal-appearing female external genitalia.  Vaginal exam notable for normal discharge, no excess, no odor.  Cervix without discharge or obvious lesion.  Bimanual exam reveals normal sized uterus no masses appreciated, no pain with examination.   Neuropathic type pain differential is broad this may be related to restless leg syndrome thus we will check ferritin.  Given polyneuropathy symptoms will check, and labs associated with this.  May benefit from nerve conduction study in the future if this persists.  Vaginal discharge benign-appearing exam.  No left lower quadrant pain on my examination.  No masses.  Reviewed most recent Pap smear, the patient thinks she is due for a Pap smear.  Guidelines recommend repeat in 5 years from the HPV is negative and normal Pap.  Pap in 2012 was also normal.  She will be due for repeat Pap in 2022.   Microalbuminuria, patient is already on maximally tolerated dose of an angiotensin receptor blocker.  Could consider addition of an SGLT2 inhibitor.  Could also consider addition of spironolactone.  May benefit from nephrology referral in the future in setting of longstanding diabetes  Diabetes mellitus without complication (HCC)  Patient is currently on glimepiride and maximal dose metformin therapy.  Could consider transition to glipizide as this is often better tolerated and associated with last hypoglycemia.  Patient referred to Dr. Graylin Shiver clinic for further evaluation.  The patient has no insurance which makes prescribing other agents difficult.  Her most recent A1c is 8.6 which is improved from prior.  Could  consider SGLT2 inhibitor particularly in light of her albuminuria.    Return to care in 1 month for PCP check.   Dorris Singh, MD  Family Medicine Teaching Service

## 2019-01-29 NOTE — Telephone Encounter (Signed)
Called patient with results. Urine albumin/creatinine ratio elevated. She is on maximal dose of ARB. Await BMP. Consider addition of spironolactone (chronic hypokalemia. Will look into options for SGLT2.   Dorris Singh, MD  Family Medicine Teaching Service

## 2019-01-30 ENCOUNTER — Other Ambulatory Visit: Payer: Self-pay | Admitting: Family Medicine

## 2019-01-30 ENCOUNTER — Telehealth: Payer: Self-pay | Admitting: Family Medicine

## 2019-01-30 DIAGNOSIS — G6289 Other specified polyneuropathies: Secondary | ICD-10-CM

## 2019-01-30 DIAGNOSIS — R7989 Other specified abnormal findings of blood chemistry: Secondary | ICD-10-CM

## 2019-01-30 LAB — MAGNESIUM: Magnesium: 2 mg/dL (ref 1.6–2.3)

## 2019-01-30 LAB — HCV COMMENT:

## 2019-01-30 LAB — BASIC METABOLIC PANEL
BUN/Creatinine Ratio: 12 (ref 9–23)
BUN: 8 mg/dL (ref 6–24)
CO2: 27 mmol/L (ref 20–29)
Calcium: 8.5 mg/dL — ABNORMAL LOW (ref 8.7–10.2)
Chloride: 97 mmol/L (ref 96–106)
Creatinine, Ser: 0.66 mg/dL (ref 0.57–1.00)
GFR calc Af Amer: 114 mL/min/{1.73_m2} (ref >59)
GFR calc non Af Amer: 99 mL/min/{1.73_m2} (ref >59)
Glucose: 276 mg/dL — ABNORMAL HIGH (ref 65–99)
Potassium: 3.1 mmol/L — ABNORMAL LOW (ref 3.5–5.2)
Sodium: 142 mmol/L (ref 134–144)

## 2019-01-30 LAB — FERRITIN: Ferritin: 305 ng/mL — ABNORMAL HIGH (ref 15–150)

## 2019-01-30 LAB — FOLATE: Folate: 7.2 ng/mL (ref >3.0)

## 2019-01-30 LAB — VITAMIN B12: Vitamin B-12: 281 pg/mL (ref 232–1245)

## 2019-01-30 LAB — HEPATITIS C ANTIBODY (REFLEX): HCV Ab: 0.1 s/co ratio (ref 0.0–0.9)

## 2019-01-30 NOTE — Telephone Encounter (Signed)
Called with results.  Elevated ferritin, patient has a reported history of restless leg syndrome. Ferritin, rather than being the anticipated low value, is elevated. Does not take MVI or ferritin. Last CBC normal. Raises concern for hematochromatosis, although less likely.   Hypokalemia, discussed at length. Patient declines potassium tablets. She will continue to increase K containing foods. Likely secondary to HCTZ. Could consider addition of spironolactone vs. Stopping HCTZ and adding amlodipine.   Patient continues to worry about memory, consider MOCA at follow up.  CMET, iron panel, ferritin, CBC ordered. Suspect due to chronic disease, although odd she does not have anemia.  Dorris Singh, MD  Family Medicine Teaching Service

## 2019-02-01 ENCOUNTER — Telehealth: Payer: Self-pay | Admitting: Family Medicine

## 2019-02-01 NOTE — Telephone Encounter (Signed)
Patient contacted for Diabetes - reports that her blood sugars have lead to neuropathy in her feet and her fingers.  She reported that when she saw Dr. Kennon Rounds a few weeks ago, she thought she did not think she was allowed to talk her neuropathies.  She reviewed the info sheet provided at the end of that visit and think she has autonomic neuropathy.  She thinks that the neuropathies started 1-2 years ago.  She reported that the last few weeks the pain has been "a total hell" for her.  She describes needlelike pain in multiple areas of her body.    We discussed her low potassium and use of HCTZ.  She agreed with stopping HCTZ at this time - we will reevaluate blood pressure and potential use of an alternate BP agent.  Plan to stop Potassium in 1 week and repeat BMET to assess potassium at that time.    She reports taking Metformin XR 500mg   2 in the AM and 2 in the PM.  She states improved tolerability with use of metformin XR.   We discussed alternative therapies we may consider next week at her clinic visit for both her Diabetes AND I did share that I would discuss neuropathic therapies with either Dr. Owens Shark or Dr. Kennon Rounds if possible prior to that appointment.

## 2019-02-01 NOTE — Telephone Encounter (Signed)
Patient has appt next week, but wanted someone to call to discuss 'autonomic neuropathy' as she is having a lot of issues with that right now.  Please call her at [phone] her mobile, or her home number [phone].

## 2019-02-02 ENCOUNTER — Ambulatory Visit: Payer: Self-pay | Admitting: Family Medicine

## 2019-02-06 NOTE — Progress Notes (Deleted)
The 10-year ASCVD risk score Mikey Bussing DC Brooke Bonito., et al., 2013) is: 6%   Values used to calculate the score:     Age: 58 years     Sex: Female     Is Non-Hispanic African American: No     Diabetic: Yes     Tobacco smoker: No     Systolic Blood Pressure: 122 mmHg     Is BP treated: Yes     HDL Cholesterol: 50 mg/dL     Total Cholesterol: 188 mg/dL

## 2019-02-08 ENCOUNTER — Ambulatory Visit (INDEPENDENT_AMBULATORY_CARE_PROVIDER_SITE_OTHER): Payer: Self-pay | Admitting: Family Medicine

## 2019-02-08 ENCOUNTER — Ambulatory Visit: Payer: Self-pay

## 2019-02-08 ENCOUNTER — Other Ambulatory Visit: Payer: Self-pay

## 2019-02-08 ENCOUNTER — Encounter: Payer: Self-pay | Admitting: Pharmacist

## 2019-02-08 ENCOUNTER — Ambulatory Visit (INDEPENDENT_AMBULATORY_CARE_PROVIDER_SITE_OTHER): Payer: Self-pay | Admitting: Pharmacist

## 2019-02-08 VITALS — BP 146/90 | HR 75 | Wt 214.4 lb

## 2019-02-08 DIAGNOSIS — R7989 Other specified abnormal findings of blood chemistry: Secondary | ICD-10-CM

## 2019-02-08 DIAGNOSIS — R112 Nausea with vomiting, unspecified: Secondary | ICD-10-CM | POA: Insufficient documentation

## 2019-02-08 DIAGNOSIS — E119 Type 2 diabetes mellitus without complications: Secondary | ICD-10-CM

## 2019-02-08 DIAGNOSIS — I10 Essential (primary) hypertension: Secondary | ICD-10-CM

## 2019-02-08 DIAGNOSIS — M545 Low back pain, unspecified: Secondary | ICD-10-CM

## 2019-02-08 DIAGNOSIS — R198 Other specified symptoms and signs involving the digestive system and abdomen: Secondary | ICD-10-CM

## 2019-02-08 DIAGNOSIS — G6289 Other specified polyneuropathies: Secondary | ICD-10-CM

## 2019-02-08 DIAGNOSIS — I951 Orthostatic hypotension: Secondary | ICD-10-CM

## 2019-02-08 MED ORDER — BACLOFEN 10 MG PO TABS: 10.0000 mg | ORAL_TABLET | Freq: Three times a day (TID) | ORAL | 0 refills | Status: DC

## 2019-02-08 MED ORDER — IBUPROFEN 600 MG PO TABS: 600.0000 mg | ORAL_TABLET | Freq: Three times a day (TID) | ORAL | 0 refills | Status: DC | PRN

## 2019-02-08 MED ORDER — CANAGLIFLOZIN 100 MG PO TABS: 100.0000 mg | ORAL_TABLET | Freq: Every day | ORAL | 3 refills | Status: DC

## 2019-02-08 MED ORDER — LOSARTAN POTASSIUM 100 MG PO TABS: 100.0000 mg | ORAL_TABLET | Freq: Every day | ORAL | 3 refills | Status: DC

## 2019-02-08 NOTE — Progress Notes (Signed)
Reviewed: I agree with Dr. Koval's documentation and management. 

## 2019-02-08 NOTE — Patient Instructions (Addendum)
It was a pleasure seeing you today in clinic, Carmen [name]!  Today the plan is...  1) Continue to STOP taking the HCTZ (do not take this at this time).   2) Start taking Losartan 100mg  once daily (make sure you pick up the correct prescription dose).   3) We will submit paperwork to [facility] for [facility] .  We anticipate you should get approval within a few days/week.  They will then send Invokana 100mg  to you directly.   Please start taking this once daily.   No other changes in your medications today.   Please schedule a follow-up in 1 month with Dr. Owens Shark and then in pharmacy clinic in 40months (late October or early November).

## 2019-02-08 NOTE — Assessment & Plan Note (Signed)
Found during review of systems.  Nonpainful.  Does not happen very often.  Appears as patient has had some colon cancer screening in her past, as her health maintenance tab is been updated, however cannot seem to locate the modality (colonoscopy, versus FOBT versus Cologuard).  However given other GI complaints, consider referral to GI for further work-up.

## 2019-02-08 NOTE — Progress Notes (Signed)
Subjective:  Carmen [name] is a 58 y.o. female who presents to the [facility] today with a chief complaint of back pain.   HPI: Patient presents with 5 days of back pain.  Is not worsening.  Movement does make it worse sometimes.  Patient denies any trauma.  Said she just woke up after sleeping on the couch with the back pain.  It is in her left lower back.  Does not radiate anywhere.  She not had back pain like this before.  Patient says that ibuprofen makes it better.  Patient is been taking 800 mg ibuprofen.  Patient is also been applying heat.  He also makes it better.  Denies personal history of cancer.  Has family history of ovarian cancer in her great aunt.  Denies bowel or bladder incontinence denies fevers/chills, weakness in lower extremities, change in sensation.  Patient works in a Production designer, theatre/television/film.  She is concerned that if she cannot get back to work she might lose her job.  She is looking forward to getting functionality back.  She says that she occasionally has muscle spasms which are very bothersome to her.  Denies trauma.   ROS: Does endorse intermittent nausea and vomiting for 1 month.  Is nonbloody nonbilious.  Last episode this morning.  Endorses constipation that improves with magnesium citrate and taking her metformin.  Does endorse tenesmus.  Denies abdominal pain, melena, hematochezia, hematemesis, chest pain, shortness of breath, dysuria, hematuria.  She is tolerating p.o. well.  PMH: Smoking history reviewed.    Objective:  Physical Exam: BP (!) 146/90   Pulse 75   Wt 214 lb 6.4 oz (97.3 kg)   BMI 38.59 kg/m   Gen: NAD, resting comfortably CV: RRR with no murmurs appreciated Pulm: NWOB, CTAB with no crackles, wheezes, or rhonchi GI: Soft, Nontender, Nondistended. MSK: Lower back nontender to palpation bilaterally, gentle range of motion intact, able to walk without using assistance, negative straight leg raise Skin: warm, dry Neuro: grossly normal, moves all  extremities Psych: Normal affect and thought content  No results found for this or any previous visit (from the past 72 hour(s)).   Assessment/Plan:  Back pain Acute lower back pain.  Likely MSK.  No red flag symptoms.  Will treat symptomatically with NSAIDs and baclofen for 10 days.  Monitor for duration of 6 weeks.  If not improving, patient to return.  Discussed with patient that she should return to clinic if has any of the red flag symptoms including bowel or bladder incontinence, weakness, worsening pain, fevers, chills or thing else out of the ordinary.  Nausea and vomiting Intermittent nausea and vomiting.  Discovered on review of systems.  Chronic.  Patient stable weight and p.o. intake.  Nonbilious nonbloody.  Abdominal exam benign.  Recommend follow-up with PCP at earliest convenience.  Consider getting BMP, lipase, LFTs, CBC.  BMP in August only notable for hyperglycemia.  If there are any lab abnormalities, consider getting abdominal imaging versus GI consult. Differential is broad but given lack of abdominal pain and history of uncontrolled diabetes, gastroparesis is a possibility.  Tenesmus (rectal) Found during review of systems.  Nonpainful.  Does not happen very often.  Appears as patient has had some colon cancer screening in her past, as her health maintenance tab is been updated, however cannot seem to locate the modality (colonoscopy, versus FOBT versus Cologuard).  However given other GI complaints, consider referral to GI for further work-up.   Lab Orders  No  laboratory test(s) ordered today    Meds ordered this encounter  Medications  . ibuprofen (ADVIL) 600 MG tablet    Sig: Take 1 tablet (600 mg total) by mouth every 8 (eight) hours as needed.    Dispense:  30 tablet    Refill:  0  . DISCONTD: baclofen (LIORESAL) 10 MG tablet    Sig: Take 1 tablet (10 mg total) by mouth 3 (three) times daily.    Dispense:  30 each    Refill:  0  . baclofen (LIORESAL) 10 MG  tablet    Sig: Take 1 tablet (10 mg total) by mouth 3 (three) times daily.    Dispense:  30 each    Refill:  0      Thomes DinningBrad , MD, MS FAMILY MEDICINE RESIDENT - PGY3 02/08/2019 12:36 PM

## 2019-02-08 NOTE — Assessment & Plan Note (Addendum)
Acute lower back pain.  Likely MSK.  No red flag symptoms.  Will treat symptomatically with NSAIDs and baclofen for 10 days.  Monitor for duration of 6 weeks.  If not improving, patient to return.  Discussed with patient that she should return to clinic if has any of the red flag symptoms including bowel or bladder incontinence, weakness, worsening pain, fevers, chills or thing else out of the ordinary.

## 2019-02-08 NOTE — Assessment & Plan Note (Signed)
Diabetes longstanding since 2015 and currently uncontrolled. Patient is able to verbalize appropriate hypoglycemia management plan. Patient reports to be adherent with medication. Control is suboptimal due to diet, not exercising enough, and being able to afford medications. - Plan to start SGLT2-I Invokana (generic name canagliflozin) to 100 mg daily.  Anticipate getting this through [facility] [place]. Application completed.  -Extensively discussed pathophysiology of DM, recommended lifestyle interventions, dietary effects on glycemic control -Counseled on s/sx of and management of hypoglycemia -Next A1C anticipated in November.

## 2019-02-08 NOTE — Assessment & Plan Note (Signed)
Hypertension longstanding and currently not controlled.  BP goal = <130/80 mmHg. Patient reports to be adherent with medication. -HCTZ was stopped due to hypokalemia last week.   -Sent in new rx for losartan 100 mg daily instead of losartan 50 mg BID (patient was only taking once daily).  - BMET obtained - reevaluate need for continued Potassium supplementation.  Suggest stopping Potassium completely if BMET reveals "normal potassium"

## 2019-02-08 NOTE — Patient Instructions (Signed)
Acute Back Pain, Adult Acute back pain is sudden and usually short-lived. It is often caused by an injury to the muscles and tissues in the back. The injury may result from:  A muscle or ligament getting overstretched or torn (strained). Ligaments are tissues that connect bones to each other. Lifting something improperly can cause a back strain.  Wear and tear (degeneration) of the spinal disks. Spinal disks are circular tissue that provides cushioning between the bones of the spine (vertebrae).  Twisting motions, such as while playing sports or doing yard work.  A hit to the back.  Arthritis. You may have a physical exam, lab tests, and imaging tests to find the cause of your pain. Acute back pain usually goes away with rest and home care. Follow these instructions at home: Managing pain, stiffness, and swelling  Take over-the-counter and prescription medicines only as told by your health care provider.  Your health care provider may recommend applying ice during the first 24-48 hours after your pain starts. To do this: ? Put ice in a plastic bag. ? Place a towel between your skin and the bag. ? Leave the ice on for 20 minutes, 2-3 times a day.  If directed, apply heat to the affected area as often as told by your health care provider. Use the heat source that your health care provider recommends, such as a moist heat pack or a heating pad. ? Place a towel between your skin and the heat source. ? Leave the heat on for 20-30 minutes. ? Remove the heat if your skin turns bright red. This is especially important if you are unable to feel pain, heat, or cold. You have a greater risk of getting burned. Activity   Do not stay in bed. Staying in bed for more than 1-2 days can delay your recovery.  Sit up and stand up straight. Avoid leaning forward when you sit, or hunching over when you stand. ? If you work at a desk, sit close to it so you do not need to lean over. Keep your chin tucked  in. Keep your neck drawn back, and keep your elbows bent at a right angle. Your arms should look like the letter "L." ? Sit high and close to the steering wheel when you drive. Add lower back (lumbar) support to your car seat, if needed.  Take short walks on even surfaces as soon as you are able. Try to increase the length of time you walk each day.  Do not sit, drive, or stand in one place for more than 30 minutes at a time. Sitting or standing for long periods of time can put stress on your back.  Do not drive or use heavy machinery while taking prescription pain medicine.  Use proper lifting techniques. When you bend and lift, use positions that put less stress on your back: ? Bend your knees. ? Keep the load close to your body. ? Avoid twisting.  Exercise regularly as told by your health care provider. Exercising helps your back heal faster and helps prevent back injuries by keeping muscles strong and flexible.  Work with a physical therapist to make a safe exercise program, as recommended by your health care provider. Do any exercises as told by your physical therapist. Lifestyle  Maintain a healthy weight. Extra weight puts stress on your back and makes it difficult to have good posture.  Avoid activities or situations that make you feel anxious or stressed. Stress and anxiety increase muscle   tension and can make back pain worse. Learn ways to manage anxiety and stress, such as through exercise. General instructions  Sleep on a firm mattress in a comfortable position. Try lying on your side with your knees slightly bent. If you lie on your back, put a pillow under your knees.  Follow your treatment plan as told by your health care provider. This may include: ? Cognitive or behavioral therapy. ? Acupuncture or massage therapy. ? Meditation or yoga. Contact a health care provider if:  You have pain that is not relieved with rest or medicine.  You have increasing pain going down  into your legs or buttocks.  Your pain does not improve after 2 weeks.  You have pain at night.  You lose weight without trying.  You have a fever or chills. Get help right away if:  You develop new bowel or bladder control problems.  You have unusual weakness or numbness in your arms or legs.  You develop nausea or vomiting.  You develop abdominal pain.  You feel faint. Summary  Acute back pain is sudden and usually short-lived.  Use proper lifting techniques. When you bend and lift, use positions that put less stress on your back.  Take over-the-counter and prescription medicines and apply heat or ice as directed by your health care provider. This information is not intended to replace advice given to you by your health care provider. Make sure you discuss any questions you have with your health care provider. Document Released: 05/24/2005 Document Revised: 09/12/2018 Document Reviewed: 01/05/2017 Elsevier Patient Education  2020 [employer]  

## 2019-02-08 NOTE — Assessment & Plan Note (Addendum)
Intermittent nausea and vomiting.  Discovered on review of systems.  Chronic.  Patient stable weight and p.o. intake.  Nonbilious nonbloody.  Abdominal exam benign.  Recommend follow-up with PCP at earliest convenience.  Consider getting BMP, lipase, LFTs, CBC.  BMP in August only notable for hyperglycemia.  If there are any lab abnormalities, consider getting abdominal imaging versus GI consult. Differential is broad but given lack of abdominal pain and history of uncontrolled diabetes, gastroparesis is a possibility.

## 2019-02-08 NOTE — Progress Notes (Signed)
S:     Chief Complaint  Patient presents with  . Medication Management    diabetes, hypertension    Patient arrives in good spirits, ambulating without assistance.  Presents for diabetes evaluation, education, and management Patient was referred on 01/29/19 by Dr. Manson PasseyBrown.  Patient was last seen by Primary Care Provider Dr. Shawnie PonsPratt on 01/23/19 .   Patient reports Diabetes was diagnosed in 2015.   Insurance coverage/medication affordability: Patient does not have insurance  Patient reports adherence with medications.  Current diabetes medications include: glimeperide 4 mg bid, metformin XR 1000 mg BID Current hypertension medications include: carvedilol 6.25 mg BID, losartan 50 mg BID (only taking one daily). Current hyperlipidemia medications include: does not take the atorvastatin that is prescribed  Patient denies hypoglycemic events.  Patient reported dietary habits: patient uses food stamps. Has expressed that she has been eating whatever she wants currently  Patient-reported exercise habits: only exercise is walking around the house   Patient reports neuropathy.  Nightly leg symptoms have improved since stopping HCTZ 1 week ago.  (She has been prescribed HCTZ since 2012 - similar duration to persistent hypokalemia).    O:  Physical Exam Constitutional:      Appearance: She is obese.  Neurological:     Mental Status: She is alert.  Psychiatric:        Mood and Affect: Mood normal.        Behavior: Behavior normal.    Review of Systems  All other systems reviewed and are negative.    Lab Results  Component Value Date   HGBA1C 8.6 (A) 01/23/2019   Vitals:   02/08/19 1017  BP: (!) 146/90  Pulse: 75  SpO2: 99%    Lipid Panel     Component Value Date/Time   CHOL 188 08/21/2018 1128   TRIG 223 (H) 08/21/2018 1128   HDL 50 08/21/2018 1128   CHOLHDL 3.8 08/21/2018 1128   CHOLHDL 3.7 11/12/2015 1554   VLDL 71 (H) 11/12/2015 1554   LDLCALC 93 08/21/2018 1128     Blood sugars at home have averaged in the 170s. Have seen as low as 130-140, but not in the last month.   Clinical ASCVD: No  The 10-year ASCVD risk score Denman George(Goff DC Jr., et al., 2013) is: 7.5%   Values used to calculate the score:     Age: [age] years     Sex: Female     Is Non-Hispanic African American: No     Diabetic: Yes     Tobacco smoker: No     Systolic Blood Pressure: 146 mmHg     Is BP treated: Yes     HDL Cholesterol: 50 mg/dL     Total Cholesterol: 188 mg/dL    A/P: Diabetes longstanding since 2015 and currently uncontrolled. Patient is able to verbalize appropriate hypoglycemia management plan. Patient reports to be adherent with medication. Control is suboptimal due to diet, not exercising enough, and being able to afford medications. - Plan to start SGLT2-I Invokana (generic name canagliflozin) to 100 mg daily.  [facility]. Application completed.  -Extensively discussed pathophysiology of DM, recommended lifestyle interventions, dietary effects on glycemic control -Counseled on s/sx of and management of hypoglycemia -Next A1C anticipated in November.   ASCVD risk - primary prevention in patient with DM. Last LDL is controlled. ASCVD risk score is not >20%  - moderate intensity statin indicated. -Patient is not taking Statin because she does not think  her cholesterol is bad and it is not necessary.  Hypertension longstanding and currently not controlled.  BP goal = <130/80 mmHg. Patient reports to be adherent with medication. -HCTZ was stopped due to hypokalemia last week.   -Sent in new rx for losartan 100 mg daily instead of losartan 50 mg BID (patient was only taking once daily).  - BMET obtained - reevaluate need for continued Potassium supplementation.  Suggest stopping Potassium completely if BMET reveals "normal potassium"   Written patient instructions provided.  Total time in face to face counseling 80 minutes.   Follow up  Clinic visit with Dr. Owens Shark in 1 month, follow up with pharmacy clinic in 2 months.   Patient seen with Murlean Iba, PharmD Candidate, Gerre Pebbles, PharmD PGY-1 Resident and Drexel Iha, PharmD,  PGY2 Pharmacy Resident.

## 2019-02-09 ENCOUNTER — Telehealth: Payer: Self-pay | Admitting: Family Medicine

## 2019-02-09 LAB — BASIC METABOLIC PANEL
BUN/Creatinine Ratio: 19 (ref 9–23)
BUN: 12 mg/dL (ref 6–24)
CO2: 23 mmol/L (ref 20–29)
Calcium: 8.3 mg/dL — ABNORMAL LOW (ref 8.7–10.2)
Chloride: 106 mmol/L (ref 96–106)
Creatinine, Ser: 0.64 mg/dL (ref 0.57–1.00)
GFR calc Af Amer: 115 mL/min/{1.73_m2} (ref >59)
GFR calc non Af Amer: 100 mL/min/{1.73_m2} (ref >59)
Glucose: 157 mg/dL — ABNORMAL HIGH (ref 65–99)
Potassium: 4.2 mmol/L (ref 3.5–5.2)
Sodium: 144 mmol/L (ref 134–144)

## 2019-02-09 LAB — CBC
Hematocrit: 34.9 % (ref 34.0–46.6)
Hemoglobin: 12 g/dL (ref 11.1–15.9)
MCH: 30.2 pg (ref 26.6–33.0)
MCHC: 34.4 g/dL (ref 31.5–35.7)
MCV: 88 fL (ref 79–97)
Platelets: 231 10*3/uL (ref 150–450)
RBC: 3.97 x10E6/uL (ref 3.77–5.28)
RDW: 12.6 % (ref 11.7–15.4)
WBC: 8.3 10*3/uL (ref 3.4–10.8)

## 2019-02-09 LAB — HEPATIC FUNCTION PANEL
ALT: 20 IU/L (ref 0–32)
AST: 12 IU/L (ref 0–40)
Albumin: 4.2 g/dL (ref 3.8–4.9)
Alkaline Phosphatase: 72 IU/L (ref 39–117)
Bilirubin Total: 0.3 mg/dL (ref 0.0–1.2)
Bilirubin, Direct: 0.1 mg/dL (ref 0.00–0.40)
Total Protein: 6.3 g/dL (ref 6.0–8.5)

## 2019-02-09 LAB — IRON,TIBC AND FERRITIN PANEL
Ferritin: 256 ng/mL — ABNORMAL HIGH (ref 15–150)
Iron Saturation: 22 % (ref 15–55)
Iron: 58 ug/dL (ref 27–159)
Total Iron Binding Capacity: 268 ug/dL (ref 250–450)
UIBC: 210 ug/dL (ref 131–425)

## 2019-02-09 NOTE — Telephone Encounter (Signed)
Attempted to call patient X1 with results. Unable to leave voicemail.  Dorris Singh, MD  Family Medicine Teaching Service

## 2019-02-13 LAB — METHYLMALONIC ACID, SERUM: Methylmalonic Acid: 130 nmol/L (ref 0–378)

## 2019-02-13 NOTE — Telephone Encounter (Signed)
Patient calls nurse line returning [place] phone call.

## 2019-02-14 NOTE — Telephone Encounter (Signed)
Attempted to call patient on both numbers, this is third try. Left generic voicemail.   Nursing- please call patient and let her know overall her labs look excellent. Her liver function is perfect. Her iron level is decreased, I recommend a repeat in 4-8 weeks  (ferritin). If persistently elevated, consider referral to GI.   Let me know if she has questions. I am happy to discuss.   Dorris Singh, MD  Family Medicine Teaching Service

## 2019-02-15 NOTE — Telephone Encounter (Signed)
Informed patient of results.  .Carmen [name] Carmen [name] Carmen [name], CMA  

## 2019-02-16 ENCOUNTER — Telehealth: Payer: Self-pay | Admitting: Family Medicine

## 2019-02-16 ENCOUNTER — Ambulatory Visit: Payer: Self-pay | Admitting: Family Medicine

## 2019-02-16 NOTE — Telephone Encounter (Signed)
**  After Hours/ Emergency Line Call**  Received a call to report that Carmen [name] cannot sleep and is having toe pain. She is a type 2 diabetic and she has neuropathy.  Patient reports that for the past month she has had increasing trouble with sharp shooting pains in her legs.  She has been seen multiple times in the clinic but she is struggling to go to bed.  She states she does not have an emergency but she is getting aggravated with not being able to go to sleep and she is also losing work.  Advised patient that she should follow-up in the clinic with her regular doctor so that they can address these issues as this is not an emergency.  And instruct her that she could go to the urgent care over the weekend.  She does not have any red flag symptoms. Red flags discussed.  Will forward to PCP.  [place] Yaire Kreher, DO PGY-3, [facility] 02/16/2019 10:49 PM

## 2019-02-22 ENCOUNTER — Telehealth: Payer: Self-pay | Admitting: Pharmacist

## 2019-02-22 NOTE — Telephone Encounter (Signed)
Spoke with patient on 02/23/19 at 1:30 PM.  Patient confirmed that [employer] has followed up with her regarding her application. Patient states that [employer] is "willing to help her" but they require paychecks over a 1 month time span rather than 2 week time span. Patient states she has printed out paychecks (1 month time span) and mailed to [facility]. Will call pt in 2 weeks to check in on status of [facility] application.  Patient also complaining of significant neuropathy and would like to be followed by Dr. Owens Shark. She states that she would prefer to be called at her boyfriend's house at the following number [phone]

## 2019-02-22 NOTE — Telephone Encounter (Signed)
Called patient regarding nerve concerns. Patient reports her BG have improved. She is concerned about her nerves. She reports her nerves bother her at night the most---jitteriness and prickliness at night.  Denies difficulty speaking, weakness, or numbness with the episodes. She has tried Xanax for these which improves her symptoms.  Triggers for the prickly feeling including speaking to strangers. Had similar symptoms in past and underwent evaluation for this including MRI. She is very concerned about neuropathy, specifically autonomic neuropathy. She would like to discuss further at appointment and consider referral to Neurology.   Dorris Singh, MD  Family Medicine Teaching Service

## 2019-02-24 ENCOUNTER — Encounter (HOSPITAL_COMMUNITY): Payer: Self-pay | Admitting: Emergency Medicine

## 2019-02-24 ENCOUNTER — Ambulatory Visit (HOSPITAL_COMMUNITY)
Admission: EM | Admit: 2019-02-24 | Discharge: 2019-02-24 | Disposition: A | Discharge status: Home / Self Care | Payer: Self-pay | Attending: Family Medicine | Admitting: Family Medicine

## 2019-02-24 ENCOUNTER — Other Ambulatory Visit: Payer: Self-pay

## 2019-02-24 DIAGNOSIS — E1142 Type 2 diabetes mellitus with diabetic polyneuropathy: Secondary | ICD-10-CM

## 2019-02-24 MED ORDER — GABAPENTIN 300 MG PO CAPS: 300.0000 mg | ORAL_CAPSULE | Freq: Three times a day (TID) | ORAL | 1 refills | Status: DC

## 2019-02-24 NOTE — Discharge Instructions (Signed)
We will use the gabapentin for your pain and to help you rest at night. Start taking the medication 1 tab at bedtime then you can increase up to 3 times a day Follow-up with your primary care for further problems

## 2019-02-24 NOTE — ED Triage Notes (Signed)
Pt sts 1 month of worsening neuropathy type sx with pain in feet and legs worse at night

## 2019-02-24 NOTE — ED Provider Notes (Signed)
[facility]    CSN: [id number] Arrival date & time: 02/24/19  1015      History   Chief Complaint Chief Complaint  Patient presents with  . Foot Pain  . Leg Pain    HPI Carmen [name] is a 58 y.o. female.   Patient is a 58 year old female with past medical history of allergy, borderline personality disorder, carpal tunnel, COPD, depression, GERD, hypertension, type 2 diabetes, numbness and tingling.  She presents today with persistent neuropathic type symptoms that are keeping her from sleeping at night.  Reporting that this is been going on for the past month.  Worsening over the past week.  She has not been able to sleep at night.  Reporting some anxiety.  History of taking Xanax in the past which has helped.  She took 2 Xanax last night without any relief.  Describing sharp stabbing pains in her feet, fingers. Describing very cold feet.   ROS per HPI      Past Medical History:  Diagnosis Date  . Adhesive capsulitis of shoulder   . Allergy   . Borderline personality disorder (HCC)   . Carpal tunnel syndrome   . COPD (chronic obstructive pulmonary disease) (HCC)   . Depression   . GERD (gastroesophageal reflux disease)   . Hypertension   . Type 2 diabetes mellitus [facility](HCC)     Patient Active Problem List   Diagnosis Date Noted  . Nausea and vomiting 02/08/2019  . Tenesmus (rectal) 02/08/2019  . Subclinical hypothyroidism 01/23/2019  . Contact dermatitis 03/25/2017  . Chronic idiopathic constipation 01/05/2017  . Orthostatic hypotension 12/20/2016  . Anxiety 07/20/2016  . Hemorrhoids 11/27/2015  . Back pain 02/27/2015  . Osteosclerosis 07/30/2014  . Numbness and tingling in left arm 07/01/2014  . Diabetes mellitus without complication (HCC) 11/15/2012  . Fatigue 11/02/2011  . Otomycosis 09/24/2011  . Metabolic syndrome 08/19/2011  . Carpal tunnel syndrome 06/10/2010  . Allergic rhinitis 11/18/2008  . Osteoarthritis of multiple joints  07/30/2008  . Adhesive capsulitis of shoulder 01/15/2008  . Obesity 07/25/2007  . GERD 09/20/2006  . DEPRESSION, MAJOR, RECURRENT 08/04/2006  . BORDERLINE PERSONALITY 08/04/2006  . HYPERTENSION, BENIGN SYSTEMIC 08/04/2006  . Irritable bowel syndrome 08/04/2006    Past Surgical History:  Procedure Laterality Date  . CHOLECYSTECTOMY    . TONSILLECTOMY      OB History   No obstetric history on file.      Home Medications    Prior to Admission medications   Medication Sig Start Date End Date Taking? Authorizing Provider  ALPRAZolam (XANAX) 0.25 MG tablet Take 1 tablet (0.25 mg total) by mouth 2 (two) times daily as needed for anxiety. 10/11/18   Mardella LaymanHagler, Brian, MD  alum & mag hydroxide-simeth (MAALOX PLUS) 400-400-40 MG/5ML suspension Take 10 mLs by mouth every 6 (six) hours as needed for indigestion. 10/19/18   Dollene ClevelandAnderson, Hannah C, DO  aspirin EC 81 MG tablet Take 81 mg by mouth daily.    [provider]  atorvastatin (LIPITOR) 40 MG tablet Take 1 tablet (40 mg total) by mouth 2 (two) times a week. Patient not taking: Reported on 02/08/2019 08/21/18   Reva BoresPratt, Tanya S, MD  Azelastine HCl 0.15 % SOLN Place 1 spray into the nose daily. 12/17/18   Cathie HoopsYu, Amy V, PA-C  baclofen (LIORESAL) 10 MG tablet Take 1 tablet (10 mg total) by mouth 3 (three) times daily. 02/08/19   Garnette Gunnerhompson, Aaron B, MD  canagliflozin (INVOKANA) 100 MG TABS tablet Take  1 tablet (100 mg total) by mouth daily. 02/08/19   Moses Manners, MD  carvedilol (COREG) 6.25 MG tablet Take 1 tablet (6.25 mg total) by mouth 2 (two) times daily with a meal. 11/27/18   Reva Bores, MD  cetirizine (ZYRTEC) 10 MG tablet Take 10 mg by mouth daily.    [provider]  esomeprazole (NEXIUM) 20 MG packet Take 20 mg by mouth daily before breakfast. 10/20/18   Georgetta Haber, NP  fluticasone (FLONASE) 50 MCG/ACT nasal spray Place 2 sprays into both nostrils daily. 10/19/18   Dollene Cleveland, DO  gabapentin (NEURONTIN) 300 MG  capsule Take 1 capsule (300 mg total) by mouth 3 (three) times daily. 02/24/19   Codi Folkerts, Gloris Manchester A, NP  glimepiride (AMARYL) 4 MG tablet Take 1 tablet (4 mg total) by mouth 2 (two) times daily. Patient to finish supply of Glipizide then only take Glimepiride. 01/23/19   Reva Bores, MD  ibuprofen (ADVIL) 600 MG tablet Take 1 tablet (600 mg total) by mouth every 8 (eight) hours as needed. 02/08/19   Garnette Gunner, MD  losartan (COZAAR) 100 MG tablet Take 1 tablet (100 mg total) by mouth at bedtime. 02/08/19   [place] Manners, MD  metFORMIN (GLUCOPHAGE-XR) 500 MG 24 hr tablet TAKE TWO TABLETS BY MOUTH TWICE PER DAY 01/23/19   Reva Bores, MD  potassium chloride SA (K-DUR) 20 MEQ tablet Take 2 tablets (40 mEq total) by mouth daily. 01/24/19   Reva Bores, MD  triamcinolone (NASACORT ALLERGY 24HR) 55 MCG/ACT AERO nasal inhaler Place 2 sprays into the nose daily.    [provider]  clonazePAM (KLONOPIN) 0.5 MG tablet Take 1 tablet (0.5 mg total) by mouth 2 (two) times daily as needed for anxiety. 01/11/11 08/19/11  Reva Bores, MD  venlafaxine (EFFEXOR) 75 MG tablet Take 1 tablet (75 mg total) by mouth 2 (two) times daily. 03/15/11 08/19/11  Reva Bores, MD    Family History Family History  Problem Relation Age of Onset  . Diabetes Mother   . Hypertension Mother     Social History Social History   Tobacco Use  . Smoking status: Former Smoker    Packs/day: 0.25    Years: 30.00    Pack years: 7.50    Types: Cigarettes    Quit date: 08/13/2007    Years since quitting: 11.5  . Smokeless tobacco: Never Used  . Tobacco comment: boyfriend smokes  Substance Use Topics  . Alcohol use: No    Comment: rarely  . Drug use: No     Allergies   Ace inhibitors and Metformin and related   Review of Systems Review of Systems   Physical Exam Triage Vital Signs ED Triage Vitals  Enc Vitals Group     BP 02/24/19 1039 122/83     Pulse Rate 02/24/19 1039 91     Resp 02/24/19 1039  18     Temp 02/24/19 1039 98.5 F (36.9 C)     Temp Source 02/24/19 1039 Oral     SpO2 02/24/19 1039 98 %     Weight --      Height --      Head Circumference --      Peak Flow --      Pain Score 02/24/19 1040 6     Pain Loc --      Pain Edu? --      Excl. in GC? --    No  data found.  Updated Vital Signs BP 122/83 (BP Location: Right Arm)   Pulse 91   Temp 98.5 F (36.9 C) (Oral)   Resp 18   SpO2 98%   Visual Acuity Right Eye Distance:   Left Eye Distance:   Bilateral Distance:    Right Eye Near:   Left Eye Near:    Bilateral Near:     Physical Exam Vitals signs and nursing note reviewed.  Constitutional:      General: She is not in acute distress.    Appearance: Normal appearance. She is not ill-appearing, toxic-appearing or diaphoretic.  HENT:     Head: Normocephalic.     Nose: Nose normal.     Mouth/Throat:     Pharynx: Oropharynx is clear.  Eyes:     Conjunctiva/sclera: Conjunctivae normal.  Neck:     Musculoskeletal: Normal range of motion.  Pulmonary:     Effort: Pulmonary effort is normal.  Abdominal:     Palpations: Abdomen is soft.     Tenderness: There is no abdominal tenderness.  Musculoskeletal: Normal range of motion.  Skin:    General: Skin is warm and dry.     Findings: No rash.  Neurological:     Mental Status: She is alert.  Psychiatric:        Mood and Affect: Mood normal.      UC Treatments / Results  Labs (all labs ordered are listed, but only abnormal results are displayed) Labs Reviewed - No data to display  EKG   Radiology No results found.  Procedures Procedures (including critical care time)  Medications Ordered in UC Medications - No data to display  Initial Impression / Assessment and Plan / UC Course  I have reviewed the triage vital signs and the nursing notes.  Pertinent labs & imaging results that were available during my care of the patient were reviewed by me and considered in my medical decision  making (see chart for details).     Diabetic neuropathy-treating with gabapentin for symptoms.  This will also help her rest at night. Instructed to follow-up with her doctor for continued problems Final Clinical Impressions(s) / UC Diagnoses   Final diagnoses:  Diabetic polyneuropathy associated with type 2 diabetes mellitus [facility])     Discharge Instructions     We will use the gabapentin for your pain and to help you rest at night. Start taking the medication 1 tab at bedtime then you can increase up to 3 times a day Follow-up with your primary care for further problems    ED Prescriptions    Medication Sig Dispense Auth. Provider   gabapentin (NEURONTIN) 300 MG capsule Take 1 capsule (300 mg total) by mouth 3 (three) times daily. 60 capsule Shawntel Farnworth A, NP     PDMP not reviewed this encounter.   Orvan July, NP 02/24/19 1206

## 2019-03-08 ENCOUNTER — Telehealth: Payer: Self-pay | Admitting: Pharmacist

## 2019-03-08 NOTE — Telephone Encounter (Signed)
Called pt on 03/08/19 at 11:08 AM.  Pt confirmed she has received Invokana through [facility] and started taking Invokana 100 mg once daily on Sunday (03/04/19). Pt confirms she is tolerating medication. Educated pt to make f/u appt with pharmacy to assess HbA1c sometime in November. Pt states she will schedule f/u PharmD appt after appt with Dr. Owens Shark on 03/16/19. Provided pt with clinic phone number with instructions to reach out if she experiences side effects from [facility]; pt verbalized understanding.

## 2019-03-16 ENCOUNTER — Encounter: Payer: Self-pay | Admitting: Family Medicine

## 2019-03-16 ENCOUNTER — Ambulatory Visit (INDEPENDENT_AMBULATORY_CARE_PROVIDER_SITE_OTHER): Payer: Self-pay | Admitting: Family Medicine

## 2019-03-16 ENCOUNTER — Other Ambulatory Visit: Payer: Self-pay

## 2019-03-16 VITALS — BP 148/82 | HR 87 | Wt 207.8 lb

## 2019-03-16 DIAGNOSIS — F419 Anxiety disorder, unspecified: Secondary | ICD-10-CM

## 2019-03-16 DIAGNOSIS — I10 Essential (primary) hypertension: Secondary | ICD-10-CM

## 2019-03-16 DIAGNOSIS — R7989 Other specified abnormal findings of blood chemistry: Secondary | ICD-10-CM

## 2019-03-16 DIAGNOSIS — E119 Type 2 diabetes mellitus without complications: Secondary | ICD-10-CM

## 2019-03-16 DIAGNOSIS — M792 Neuralgia and neuritis, unspecified: Secondary | ICD-10-CM

## 2019-03-16 MED ORDER — GABAPENTIN 300 MG PO CAPS: 300.0000 mg | ORAL_CAPSULE | Freq: Three times a day (TID) | ORAL | 1 refills | Status: DC

## 2019-03-16 NOTE — Assessment & Plan Note (Signed)
Discussed her insomnia at length as well as anxiety.  Discussed the importance of taking only prescribed medications.  Reviewed sleep hygiene measures and went to breathing measures.

## 2019-03-16 NOTE — Assessment & Plan Note (Signed)
Patient continues to be very worried that she has multiple forms of diabetic neuropathy.  She does indeed have type 2 diabetes which has been poorly controlled for some time.  She has improving control.  She has some benefit from gabapentin.  We discussed the appropriate dosing of this.  We discussed side effect profile she is interested in continuing the medication.  She has been quite extensively evaluated for symptoms at this time we will repeat metabolic panel as below.

## 2019-03-16 NOTE — Assessment & Plan Note (Addendum)
The patient recently started Invokana.  We will repeat a metabolic panel today.  She is having no side effects.  Consider discontinuing glimepiride in the future for alternative medication.

## 2019-03-16 NOTE — Patient Instructions (Signed)
It was wonderful to see you today.  Thank you for choosing [facility].   Please call [phone] with any questions about today's appointment.  Please be sure to schedule follow up at the front  desk before you leave today.   Carina Brown, MD  Family Medicine    

## 2019-03-16 NOTE — Progress Notes (Signed)
  Patient Name: Carmen [name] Date of Birth: January 19, 1961 Date of Visit: 03/16/19 PCP: Donnamae Jude, MD  Chief Complaint: Pelvic and foot pain  Subjective: Carmen [name] is a pleasant 58 y.o. with medical history significant for type 2 diabetes, hypertension, mood disorder and anxiety presenting today for multiple concerns.  Concern for neuropathic pain The patient was recently seen at urgent care for a multitude of symptoms.  Most concerning symptom symptom is that she has different what she describes as zaps throughout her body.  Oftentimes these occur at night sometimes they occur during the day they are often located in her legs perineal area or face.  They last for only a few seconds she was prescribed gabapentin.  She takes is between 0-3 times a day currently.  She finds it does help but does not work for about 4 hours after she takes.  She denies chest pain or difficulty breathing.  She feels overall her health is doing better.  Insomnia The patient reports ongoing difficulty with sleep.  She sleeps on the couch or in her bed with the TV on.  She wakes up about every 1.5 hours to go to the bathroom.  She does not limit caffeine or fluids before going to bed.  She denies problems with anxiety.  She recently tried some Atarax which she noted put her right to sleep but caused significant sleepiness to the point that it worried her   Diabetes .  The patient reports she is taking Invokana without side effect.  She is tolerating maximal dose metformin without GI upset.  She denies any hypoglycemia at this time.  ROS: Per HPI.   I have reviewed the patient's medical, surgical, family, and social history as appropriate.  Vitals:   03/16/19 1018  BP: (!) 148/82  Pulse: 87  SpO2: 98%    HEENT: Sclera anicteric. Dentition is poor. Appears well hydrated. Neck: Supple Cardiac: Regular rate and rhythm. Normal S1/S2. No murmurs, rubs, or gallops appreciated. Lungs: Clear  bilaterally to ascultation.  Extremities: Warm, well perfused without edema.  Skin: Warm, dry Psych: Pleasant and appropriate    Diabetes mellitus without complication ([place]) The patient recently started Invokana.  We will repeat a metabolic panel today.  She is having no side effects.  Consider discontinuing glimepiride in the future for alternative medication.  Anxiety Discussed her insomnia at length as well as anxiety.  Discussed the importance of taking only prescribed medications.  Reviewed sleep hygiene measures and went to breathing measures.  Neuropathic pain Patient continues to be very worried that she has multiple forms of diabetic neuropathy.  She does indeed have type 2 diabetes which has been poorly controlled for some time.  She has improving control.  She has some benefit from gabapentin.  We discussed the appropriate dosing of this.  We discussed side effect profile she is interested in continuing the medication.  She has been quite extensively evaluated for symptoms at this time we will repeat metabolic panel as below.   Repeat TSH at follow up.   Return to care in 3 weeks via virtual visit .   Dorris Singh, MD  Family Medicine Teaching Service

## 2019-03-17 LAB — HEPATIC FUNCTION PANEL
ALT: 19 IU/L (ref 0–32)
AST: 15 IU/L (ref 0–40)
Albumin: 4.2 g/dL (ref 3.8–4.9)
Alkaline Phosphatase: 73 IU/L (ref 39–117)
Bilirubin Total: 0.4 mg/dL (ref 0.0–1.2)
Bilirubin, Direct: 0.09 mg/dL (ref 0.00–0.40)
Total Protein: 6.4 g/dL (ref 6.0–8.5)

## 2019-03-17 LAB — BASIC METABOLIC PANEL
BUN/Creatinine Ratio: 12 (ref 9–23)
BUN: 8 mg/dL (ref 6–24)
CO2: 26 mmol/L (ref 20–29)
Calcium: 8.5 mg/dL — ABNORMAL LOW (ref 8.7–10.2)
Chloride: 102 mmol/L (ref 96–106)
Creatinine, Ser: 0.68 mg/dL (ref 0.57–1.00)
GFR calc Af Amer: 113 mL/min/{1.73_m2} (ref >59)
GFR calc non Af Amer: 98 mL/min/{1.73_m2} (ref >59)
Glucose: 195 mg/dL — ABNORMAL HIGH (ref 65–99)
Potassium: 3.8 mmol/L (ref 3.5–5.2)
Sodium: 145 mmol/L — ABNORMAL HIGH (ref 134–144)

## 2019-03-17 LAB — FERRITIN: Ferritin: 263 ng/mL — ABNORMAL HIGH (ref 15–150)

## 2019-03-21 ENCOUNTER — Telehealth: Payer: Self-pay | Admitting: Family Medicine

## 2019-03-21 ENCOUNTER — Encounter: Payer: Self-pay | Admitting: Family Medicine

## 2019-03-21 NOTE — Telephone Encounter (Signed)
Called with results.  Discussed elevated ferritin, possibly due to NAFLD, recommended ultrasound and consideration of hepatology, patient will think about this.  All questions answered, working on BG levels.   Needs ultrasound and TSH at follow up.Discussed with patient.   Dorris Singh, MD  Family Medicine Teaching Service

## 2019-04-09 ENCOUNTER — Encounter: Payer: Self-pay | Admitting: Family Medicine

## 2019-04-09 ENCOUNTER — Other Ambulatory Visit: Payer: Self-pay

## 2019-04-09 ENCOUNTER — Telehealth (INDEPENDENT_AMBULATORY_CARE_PROVIDER_SITE_OTHER): Payer: 59 | Admitting: Family Medicine

## 2019-04-09 DIAGNOSIS — E038 Other specified hypothyroidism: Secondary | ICD-10-CM

## 2019-04-09 DIAGNOSIS — I10 Essential (primary) hypertension: Secondary | ICD-10-CM

## 2019-04-09 DIAGNOSIS — F331 Major depressive disorder, recurrent, moderate: Secondary | ICD-10-CM

## 2019-04-09 DIAGNOSIS — F419 Anxiety disorder, unspecified: Secondary | ICD-10-CM

## 2019-04-09 DIAGNOSIS — M792 Neuralgia and neuritis, unspecified: Secondary | ICD-10-CM

## 2019-04-09 DIAGNOSIS — E039 Hypothyroidism, unspecified: Secondary | ICD-10-CM

## 2019-04-09 DIAGNOSIS — E119 Type 2 diabetes mellitus without complications: Secondary | ICD-10-CM

## 2019-04-09 MED ORDER — GABAPENTIN 300 MG PO CAPS: ORAL_CAPSULE | ORAL | 0 refills | Status: DC

## 2019-04-09 MED ORDER — HYDROXYZINE HCL 25 MG PO TABS: 25.0000 mg | ORAL_TABLET | Freq: Every day | ORAL | 0 refills | Status: DC | PRN

## 2019-04-09 MED ORDER — CARVEDILOL 6.25 MG PO TABS: 6.2500 mg | ORAL_TABLET | Freq: Two times a day (BID) | ORAL | 3 refills | Status: DC

## 2019-04-09 NOTE — Progress Notes (Signed)
[place] Telemedicine Visit  Patient consented to have virtual visit. Method of visit: Telephone  Encounter participants: Patient: Carmen [name] - located at home (boyfriend's house)  Provider: Martyn Malay - located at [facility] Others (if applicable): none  Chief Complaint: Neuropathy   HPI:  [name] is a pleasant 58 year old with history of type 2 diabetes, depression, obesity, and subclinical hypothyroidism resenting via virtual visit for a check-in and concerns about some labs.  The patient reports her blood glucoses have been quite low since starting the Invokana.  She reports morning blood glucoses ranging from 89-124.  For example this morning she woke up at 4 AM and consumed 2 biscuits with spam.  She then checked her blood sugar at 8 AM is 89.  Her glucometer is rather new.  She endorses some fatigue and symptoms of hypoglycemia which caused her to crave carbs.  She denies polyuria polydipsia.  She is currently taking glimepiride once in the morning, Metformin at maximal dose and Invokana.  She denies any side effects from the [place].  The patient reports her neuropathy is uncontrolled.  She takes gabapentin 300 mg in the morning at 2 PM and again around 8 PM.  She then wakes up around 4 AM with persistent symptoms her symptoms are numbness and tingling in her lower extremities.  She denies rash, swelling or other medications.  The patient also endorses ongoing anxiety.  She has a number of stressors in her life.  Her best friend recently got married.  Her car had a flat tire and she is under considerable financial stress currently.  She reports her anxiety is elevated.  She was previously on a benzodiazepine and is now taking Atarax as needed for anxiety.  She uses this once to twice per week.  She denies side effects such as dry mouth with this medication.  She requests a refill.  ROS: per HPI  Pertinent PMHx:  Elevated serum  ferritin Subclinical hypothyroidism Type 2 diabetes which is currently not well controlled Mood disorder  Exam:  Respiratory: Speaking in full sentences breathing comfortably throughout conversation  Assessment/Plan:  Diabetes mellitus without complication ([place]) Discussed options at length.  Given the low blood sugars in the morning of 80s to 90s recommended discontinuing glimepiride.  The patient is considering this.  She reports she will finish out the medication.  She needs a repeat A1c when she sees pharmacy team on 12 November.  Subclinical hypothyroidism Repeat TSH for upcoming appointment.  Denies signs or symptoms currently.  Neuropathy may be worsened due to thyroid disease.  Neuropathic pain Most likely this is due to diabetes.  The patient has a number of symptoms.  Gabapentin is helping and may also help with underlying mood disorder.  Will increase nighttime dose.  Major depressive disorder, recurrent episode ([place]) With strong anxiety component, refilled hydroxyzine.  She uses only 1-2 times per week.  We discussed the associated risks of the antihistamine and side effect profile.   Discussed obtaining right upper quadrant ultrasound in the future which patient is agreeable to discussing another time  Time spent during visit with patient: 19 minutes

## 2019-04-09 NOTE — Assessment & Plan Note (Signed)
Discussed options at length.  Given the low blood sugars in the morning of 80s to 90s recommended discontinuing glimepiride.  The patient is considering this.  She reports she will finish out the medication.  She needs a repeat A1c when she sees pharmacy team on 12 November.

## 2019-04-09 NOTE — Assessment & Plan Note (Signed)
Repeat TSH for upcoming appointment.  Denies signs or symptoms currently.  Neuropathy may be worsened due to thyroid disease.

## 2019-04-09 NOTE — Assessment & Plan Note (Signed)
With strong anxiety component, refilled hydroxyzine.  She uses only 1-2 times per week.  We discussed the associated risks of the antihistamine and side effect profile.

## 2019-04-09 NOTE — Assessment & Plan Note (Signed)
Most likely this is due to diabetes.  The patient has a number of symptoms.  Gabapentin is helping and may also help with underlying mood disorder.  Will increase nighttime dose.

## 2019-04-16 ENCOUNTER — Telehealth: Payer: Self-pay

## 2019-04-16 NOTE — Telephone Encounter (Signed)
Patient calls nurse line stating she is having a hard time with neuropathy pain. Patient stated the "shooting" pains have been occurring in her face all day today without relief. Patient stated she took 1 Gabapentin today ~230p and doesn't think she can wait until her normal night time dose. Patient is wondering if she can take an extra one in between or something else. Please advise.

## 2019-04-16 NOTE — Telephone Encounter (Signed)
Called patient to discuss at length, it her birthday. Recommended taking 1 of her nighttime pills now. She is to call in AM if not improved. Dorris Singh, MD  Family Medicine Teaching Service

## 2019-04-19 ENCOUNTER — Ambulatory Visit: Payer: Self-pay | Admitting: Pharmacist

## 2019-04-19 ENCOUNTER — Other Ambulatory Visit: Payer: 59

## 2019-04-26 ENCOUNTER — Ambulatory Visit: Payer: 59 | Admitting: Pharmacist

## 2019-04-26 ENCOUNTER — Other Ambulatory Visit: Payer: 59

## 2019-04-26 ENCOUNTER — Telehealth: Payer: Self-pay | Admitting: Pharmacist

## 2019-04-26 DIAGNOSIS — E119 Type 2 diabetes mellitus without complications: Secondary | ICD-10-CM

## 2019-04-26 MED ORDER — CANAGLIFLOZIN 300 MG PO TABS: 300.0000 mg | ORAL_TABLET | Freq: Every day | ORAL | 3 refills | Status: DC

## 2019-04-26 NOTE — Telephone Encounter (Signed)
Noted and agree. 

## 2019-04-26 NOTE — Assessment & Plan Note (Signed)
Blood sugars were reported to be improved.  Patient reported seeing a few blood sugars ~ 150 in the last few weeks.  Majority of readings were > 200 and she admits to not checking frequently.  She denied and symptoms of low blood sugar.    She reported taking Metformin 1000mg  (2X500mg ) TWICE daily - misses some days Glimepiride 4mg  daily Invokana (canagliflozin) 100mg  once daily - tolerating this fine.  She remains reluctant to try any injectable medication.   Willing to try a higher dose of invokana (300mg  prescription sent to [facility] in [place]).

## 2019-04-26 NOTE — Telephone Encounter (Signed)
Phone call follow-up of missed appointment today.   Patient reports being very busy with multiple life stressors.  Blood sugars were reported to be improved.  Patient reported seeing a few blood sugars ~ 150 in the last few weeks.  Majority of readings were > 200 and she admits to not checking frequently.  She denied and symptoms of low blood sugar.    She reported taking Metformin 1000mg  (2X500mg ) TWICE daily - misses some days Glimepiride 4mg  daily Invokana (canagliflozin) 100mg  once daily - tolerating this fine.  She remains reluctant to try any injectable medication.   Willing to try a higher dose of invokana (300mg  prescription sent to [facility] in [place]).    I will contact her again within 2 weeks if she fails to come to an appointment for blood work [facility] at [facility].  Patient clarified that she has filled out paperwork to have Dr. Dorris Singh as her provider.  She request I share information with her.

## 2019-04-27 ENCOUNTER — Telehealth: Payer: Self-pay

## 2019-04-27 NOTE — Telephone Encounter (Signed)
Left generic voicemail to call back.  Brett Soza, MD  Family Medicine Teaching Service   

## 2019-04-27 NOTE — Telephone Encounter (Signed)
Patient LVM on nurse line requesting a call from [place]. Patient stated she does not feel well, runny nose and sinus pressure. Per Owens Shark ok to send back to her.

## 2019-04-30 NOTE — Telephone Encounter (Signed)
Called patient to check in---she reports symptoms improved with Nasicort, will call back if return/persist.  Dorris Singh, MD  [facility] Medicine Teaching Service

## 2019-05-07 ENCOUNTER — Telehealth: Payer: Self-pay | Admitting: Pharmacist

## 2019-05-07 NOTE — Telephone Encounter (Signed)
Called pt on 05/07/2019 at 2:54 PM.   Called pt to remind her to get HbA1c lab drawn. She states she has been unable to come in to get labs due to "having nasal drainage/cold" during last time she was planning on coming in and now it has been challenging to come in due to how her workplace is currently requiring employees to work additional hours because of [place]. Reminded pt to NOT come in to get labs drawn if she is feeling sick considering COVID-19 surge. Pt verbalized understanding. She states she will get her schedule on Wednesday 05/09/2019 and she will try to get labs drawn before 06/01/19. She is also planning on scheduling appt with Dr. Dorris Singh and Dr. Nicholas Lose (preferably on the same day) as well.  Pt confirms she received canagliflozin 300 mg daily prescription. Educated pt that she can take three canagliflozin 100 mg tablets daily until she completes canagliflozin 100 mg daily prescription then she can start canagliflozin 300 mg daily. Pt verbalized understanding.

## 2019-05-11 ENCOUNTER — Other Ambulatory Visit: Payer: Self-pay | Admitting: *Deleted

## 2019-05-11 DIAGNOSIS — E119 Type 2 diabetes mellitus without complications: Secondary | ICD-10-CM

## 2019-05-11 MED ORDER — GABAPENTIN 300 MG PO CAPS: 300.0000 mg | ORAL_CAPSULE | Freq: Three times a day (TID) | ORAL | 1 refills | Status: DC

## 2019-05-11 NOTE — Telephone Encounter (Signed)
I contacted patient in the morning and requested her to return call. She returned call and reported that she has received the Invokana 300mg  supply from the manufacturer but at this time is still finishing the 100mg  dose.   She has not been checking blood sugar readings routinely but reports last reading was 124.  She denied any hypoglycemia.   She requested refill of Gabapentin at the 4 pills per day quantity.  She takes 1 in AM, 1 mid-day and 2 at night.  I agreed that we could assist with that refill at this time.   She plans Rx clinic follow-up in 2 weeks.

## 2019-05-11 NOTE — Telephone Encounter (Signed)
-----   Message from Leavy Cella, [facility] sent at 04/26/2019  5:02 PM EST ----- Regarding: Diabetes Follow-up

## 2019-05-17 ENCOUNTER — Encounter (HOSPITAL_COMMUNITY): Payer: Self-pay | Admitting: Emergency Medicine

## 2019-05-17 ENCOUNTER — Other Ambulatory Visit: Payer: 59

## 2019-05-17 ENCOUNTER — Ambulatory Visit (HOSPITAL_COMMUNITY)
Admission: EM | Admit: 2019-05-17 | Discharge: 2019-05-17 | Disposition: A | Discharge status: Home / Self Care | Payer: 59 | Attending: Family Medicine | Admitting: Family Medicine

## 2019-05-17 ENCOUNTER — Encounter: Payer: Self-pay | Admitting: Pharmacist

## 2019-05-17 ENCOUNTER — Other Ambulatory Visit: Payer: Self-pay

## 2019-05-17 ENCOUNTER — Ambulatory Visit (INDEPENDENT_AMBULATORY_CARE_PROVIDER_SITE_OTHER): Payer: 59 | Admitting: Pharmacist

## 2019-05-17 ENCOUNTER — Ambulatory Visit: Payer: 59 | Admitting: Pharmacist

## 2019-05-17 VITALS — BP 180/108 | HR 98 | Wt 206.8 lb

## 2019-05-17 DIAGNOSIS — E119 Type 2 diabetes mellitus without complications: Secondary | ICD-10-CM

## 2019-05-17 DIAGNOSIS — N760 Acute vaginitis: Secondary | ICD-10-CM

## 2019-05-17 DIAGNOSIS — J309 Allergic rhinitis, unspecified: Secondary | ICD-10-CM

## 2019-05-17 DIAGNOSIS — F419 Anxiety disorder, unspecified: Secondary | ICD-10-CM

## 2019-05-17 DIAGNOSIS — R0981 Nasal congestion: Secondary | ICD-10-CM

## 2019-05-17 DIAGNOSIS — I10 Essential (primary) hypertension: Secondary | ICD-10-CM

## 2019-05-17 LAB — POCT URINALYSIS DIP (DEVICE)
Bilirubin Urine: NEGATIVE
Glucose, UA: 500 mg/dL — AB
Ketones, ur: NEGATIVE mg/dL
Leukocytes,Ua: NEGATIVE
Nitrite: NEGATIVE
Protein, ur: 100 mg/dL — AB
Specific Gravity, Urine: >=1.03 (ref 1.005–1.030)
Urobilinogen, UA: 0.2 mg/dL (ref 0.0–1.0)
pH: 6 (ref 5.0–8.0)

## 2019-05-17 MED ORDER — CARVEDILOL 6.25 MG PO TABS: 6.2500 mg | ORAL_TABLET | Freq: Two times a day (BID) | ORAL | 3 refills | Status: DC

## 2019-05-17 MED ORDER — SPIRONOLACTONE 25 MG PO TABS: 25.0000 mg | ORAL_TABLET | Freq: Every day | ORAL | 11 refills | Status: DC

## 2019-05-17 MED ORDER — TRIAMCINOLONE ACETONIDE 55 MCG/ACT NA AERO: 2.0000 | INHALATION_SPRAY | Freq: Every day | NASAL | 11 refills | Status: DC

## 2019-05-17 MED ORDER — NYSTATIN 100000 UNIT/GM EX CREA: TOPICAL_CREAM | CUTANEOUS | 1 refills | Status: DC

## 2019-05-17 MED ORDER — FLUCONAZOLE 150 MG PO TABS: 150.0000 mg | ORAL_TABLET | Freq: Every day | ORAL | 0 refills | Status: DC

## 2019-05-17 MED ORDER — METFORMIN HCL ER 500 MG PO TB24: ORAL_TABLET | ORAL | 5 refills | Status: DC

## 2019-05-17 MED ORDER — LOSARTAN POTASSIUM 100 MG PO TABS: 100.0000 mg | ORAL_TABLET | Freq: Every day | ORAL | 3 refills | Status: DC

## 2019-05-17 MED ORDER — AMLODIPINE BESYLATE 5 MG PO TABS: 5.0000 mg | ORAL_TABLET | Freq: Every day | ORAL | 3 refills | Status: DC

## 2019-05-17 MED ORDER — ATORVASTATIN CALCIUM 40 MG PO TABS: 40.0000 mg | ORAL_TABLET | Freq: Every day | ORAL | 4 refills | Status: DC

## 2019-05-17 NOTE — Progress Notes (Addendum)
S:     Chief Complaint  Patient presents with  . Medication Management    diabetes     Patient arrives in good spirits ambulating without assistance.  Presents for diabetes evaluation, education, and management. She states she has recently start 300 mg dose of canagliflozin (unsure of exact date). She states she is tolerating her medications and is not experiencing side effects. She remains having complaints of neuropathy, increased urination, and frequent constipation. She is planning on starting a new supplement of Nerve Control 911 to manage neuropathy pain.    Patient reports frequent headaches and shortness of breath. She denies chest pain, facial drooping, lightheadedness, and dizziness. Patient reports she has a wrist BP cuff at home. She is unable to use arm BP cuff due to dexterity.  Patient has questions about Linzess use.  Patient was referred on 01/29/19 by Dr. Owens Shark.  Patient was last seen (via telemedicine) by Dr. Owens Shark on 04/09/2019.  Patient reports Diabetes was diagnosed in 2015.  Family/Social History: uncle (T2DM)  -Former smoker (12-13 years ago) -Seldomly drinks alcohol   Insurance coverage/medication affordability: [facility]  Patient reports adherence with most medications, however, will skip metformin AM dose if she is busy. She does occasionally forgets if she has actually taken her medications or nto.   Current diabetes medications include: metformin 1000 mg BID, glimepiride 4 mg daily, canagliflozin 300 mg daily Current hypertension medications include: carvedilol 6.25 mg BID, losartan 100 mg daily Current hyperlipidemia medications include: atorvastatin 40 mg daily  Patient reports one possible hypoglycemic event. Patient treated with PB&J sandwich and grape juice  Patient reported dietary habits: Eats 3 meals/day Breakfast:  --Weekdays: [employer], [employer] --Weekends: eggs, bacon, sausage, grits, [employer]    Lunch: "something quick",  PB&J or leftovers Dinner: vegetable plate, sandwiches, barbecue Snacks: [place] mints, cookies, peanut butter crackers, unsalted nuts, chocolate bars Drinks: Nestle lemon water, soda (varies intake)  -When she is at her mother's house she states she eats worse than when she is at her boyfriend's house- -Doesn't like "plain" water  Patient-reported exercise habits: has not been exercising due to COVID-19, discontinued gym membership in Sept 2020 -Has been wanting to buy a bicycle however has not been able to due to COVID-19. Also, challenging due to needing new car.     Patient reports nocturia (nighttime urination) every 1.5 - 2 hours.  Patient reports neuropathy (nerve pain). Patient reports self foot exams. She reports having sensation in her feet.    O:  Physical Exam Constitutional:      Appearance: Normal appearance.  Musculoskeletal:     Right lower leg: Edema present.     Left lower leg: Edema present.     Comments: +1 pitting edema in both lower extremities  Skin:    General: Skin is dry.     Comments: Upon examination, feet were noted to have xerosis and onychomycosis  Psychiatric:        Mood and Affect: Mood normal.        Behavior: Behavior normal.        Thought Content: Thought content normal.        Judgment: Judgment normal.    Review of Systems  Cardiovascular: Negative for chest pain and palpitations.  Gastrointestinal: Positive for constipation.  Genitourinary: Positive for frequency.  Neurological: Positive for tingling and headaches. Negative for dizziness.  All other systems reviewed and are negative.    Lab Results  Component Value Date  HGBA1C 8.6 (A) 01/23/2019   There were no vitals filed for this visit.  Lipid Panel     Component Value Date/Time   CHOL 188 08/21/2018 1128   TRIG 223 (H) 08/21/2018 1128   HDL 50 08/21/2018 1128   CHOLHDL 3.8 08/21/2018 1128   CHOLHDL 3.7 11/12/2015 1554   VLDL 71 (H) 11/12/2015 1554   LDLCALC 93  08/21/2018 1128    BG readings range from 80-200; frequently in 150s  Meter is not calibrated to date or time   Clinical Atherosclerotic Cardiovascular Disease (ASCVD): No  The 10-year ASCVD risk score Denman George(Goff DC Jr., et al., 2013) is: 8.4%   Values used to calculate the score:     Age: [age] years     Sex: Female     Is Non-Hispanic African American: No     Diabetic: Yes     Tobacco smoker: No     Systolic Blood Pressure: 148 mmHg     Is BP treated: Yes     HDL Cholesterol: 50 mg/dL     Total Cholesterol: 188 mg/dL    A/P: Diabetes longstanding currently controlled. Patient is able to verbalize appropriate hypoglycemia management plan. Patient is occasionally non-adherent with medication.  - Continue canagliflozin 300 mg daily - Continue metformin 1000 mg BID - Discontinue glimepiride 4 mg daily - Extensively discussed pathophysiology of diabetes, recommended lifestyle interventions, dietary effects on blood sugar control - Counseled on s/sx of and management of hypoglycemia - A1c reading was drawn today (05/17/2019), which was 6.8. - Meter was calibrated to date/time - Follow up via telephone within 1 week regarding safety of omega XL and nerve control 911 use - Send refills of all medications to [facility]  - Defer questions of 1824-month supply of gabapentin, abnormal kidney function in November 2020, and Linzess use  ASCVD risk - primary secondary prevention in patient with diabetes. Last LDL is controlled. ASCVD risk score is not >20%. Moderate intensity statin indicated. Aspirin could be considered, however, patient states she would like to simplify medication regimen so will hold off on risk-benefit based discussion for now.  -Re-initiate of atorvastatin 40 mg daily  Hypertension longstanding currently uncontrolled.  Blood pressure goal = <130/80 mmHg. Patient reports medication adherence.  Blood pressure control is suboptimal due to diet and multiple life stressors. BP readings  are significantly elevated >180/100 mmHg during office visit. Administered clonidine 0.1 mg once (LOT [id number] [id number]; Expiration 06/06/2020). Plan to initiate spironolactone 25 mg daily. Counseled patient thoroughly on efficacy, mechanism of action, side effects, dosing, and administration. Prefer spironolactone considering amlodipine will increase swelling (patient has +1 pitting edema upon evaluation) and HCTZ has caused hypokalemia in the past. Patient verbalized understanding. - START spironolactone 25 mg daily. Patient educated on purpose, proper use and potential adverse effects of lab effects and urination. Following instruction patient verbalized understanding of treatment plan. - Continue losartan 100 mg daily and carvedilol 6.25 mg twice daily  Financial Assistance. [facility], however, only receives canagliflozin (Invokana) [facility]. Will send in refills for all medications to [facility] and make [facility] preferred pharmacy in patient's chart.  Written patient instructions provided.  Total time in face to face counseling 120 minutes.   Follow up PCP Clinic Visit in 1 month.   Patient seen with Zachery ConchMary Taylor, PGY2 Ambulatory Care Resident, and Dr. Raymondo BandKoval.   TSH obtained as follow-up of elevated reading.  Result: Normal  BMET WNL with  exception of elevated blood sugar.

## 2019-05-17 NOTE — Patient Instructions (Addendum)
It was a pleasure seeing you in clinic!  1. Continue Invokana (canagliflozin) 300 mg daily and metformin 1000 mg twice daily  2. STOP glimepiride   3. START spironolactone 25 mg daily  4. Continue carvedilol 6.25 mg twice daily and losartan 100 mg daily  5. Schedule an appointment with Dr. Owens Shark and to get labs in ~1 month  6. Start checking your BP every day  7. Bring in your blood pressure monitor to clinic next  Please call the clinic in the meantime if you have any questions/concerns

## 2019-05-17 NOTE — Progress Notes (Signed)
Reviewed: I agree with Dr. Koval's documentation and management. 

## 2019-05-17 NOTE — Assessment & Plan Note (Addendum)
ASCVD risk - primary secondary prevention in patient with diabetes. Last LDL is controlled. ASCVD risk score is not >20%. Moderate intensity statin indicated. Aspirin could be considered, however, patient states she would like to simplify medication regimen so will hold off on risk-benefit based discussion for now.  -Re-initiate of atorvastatin 40 mg daily  Hypertension longstanding currently uncontrolled.  Blood pressure goal = <130/80 mmHg. Patient reports medication adherence.  Blood pressure control is suboptimal due to diet and multiple life stressors. BP readings are significantly elevated >180/100 mmHg during office visit. Administered clonidine 0.1 mg once (LOT [id number]; Expiration 06/06/2020). Plan to initiate spironolactone 25 mg daily. Counseled patient thoroughly on efficacy, mechanism of action, side effects, dosing, and administration. Prefer spironolactone considering amlodipine will increase swelling (patient has +1 pitting edema upon evaluation) and HCTZ has caused hypokalemia in the past. Patient verbalized understanding. - START spironolactone 25 mg daily. Patient educated on purpose, proper use and potential adverse effects of lab effects and urination. Following instruction patient verbalized understanding of treatment plan. - Continue losartan 100 mg daily and carvedilol 6.25 mg twice daily

## 2019-05-17 NOTE — Discharge Instructions (Addendum)
Treating you for a yeast infection Take the medication as prescribed Make sure you take your blood pressure medication when you get home and recheck your blood pressure.

## 2019-05-17 NOTE — Assessment & Plan Note (Signed)
Diabetes longstanding currently controlled. Patient is able to verbalize appropriate hypoglycemia management plan. Patient is occasionally non-adherent with medication.  - Continue canagliflozin 300 mg daily - Continue metformin 1000 mg BID - Discontinue glimepiride 4 mg daily - Extensively discussed pathophysiology of diabetes, recommended lifestyle interventions, dietary effects on blood sugar control - Counseled on s/sx of and management of hypoglycemia - A1c reading was drawn today (05/17/2019), which was 6.8. - Meter was calibrated to date/time - Follow up via telephone within 1 week regarding safety of omega XL and nerve control 911 use - Send refills of all medications to [place] [facility]

## 2019-05-17 NOTE — ED Triage Notes (Addendum)
Pt complains of vaginal itching that started two days ago.  Pt did a treatment of monistat two nights ago and again last night.  She also reports some bumps near her rectum and would like for someone to look at them in case they are not hemorrhoids.  Pt's BP is elevated in clinic today at 210/88.Marland Kitchenthe patient was given .1mg  of Clonidine while at her diabetic clinic visit earlier today.

## 2019-05-18 ENCOUNTER — Encounter: Payer: Self-pay | Admitting: Family Medicine

## 2019-05-18 LAB — TSH: TSH: 3.97 u[IU]/mL (ref 0.450–4.500)

## 2019-05-18 LAB — BASIC METABOLIC PANEL
BUN/Creatinine Ratio: 14 (ref 9–23)
BUN: 9 mg/dL (ref 6–24)
CO2: 23 mmol/L (ref 20–29)
Calcium: 8.6 mg/dL — ABNORMAL LOW (ref 8.7–10.2)
Chloride: 103 mmol/L (ref 96–106)
Creatinine, Ser: 0.66 mg/dL (ref 0.57–1.00)
GFR calc Af Amer: 113 mL/min/{1.73_m2} (ref >59)
GFR calc non Af Amer: 98 mL/min/{1.73_m2} (ref >59)
Glucose: 191 mg/dL — ABNORMAL HIGH (ref 65–99)
Potassium: 3.6 mmol/L (ref 3.5–5.2)
Sodium: 142 mmol/L (ref 134–144)

## 2019-05-18 LAB — HEMOGLOBIN A1C
Est. average glucose Bld gHb Est-mCnc: 151 mg/dL
Hgb A1c MFr Bld: 6.9 % — ABNORMAL HIGH (ref 4.8–5.6)

## 2019-05-20 ENCOUNTER — Encounter: Payer: Self-pay | Admitting: Family Medicine

## 2019-05-21 NOTE — ED Provider Notes (Signed)
[facility]    CSN: [id number] Arrival date & time: 05/17/19  1308      History   Chief Complaint Chief Complaint  Patient presents with  . Vaginal Itching    HPI Carmen [name] is a 58 y.o. female.   Patient is a 58 year old female with past medical history of, borderline personality disorder, carpal tunnel, COPD, depression, GERD, hypertension and type 2 diabetes.  She presents today with vaginal itching, irritation, redness that started approximately 2 days ago. Some odor.  Tried Monistat x2 before bed but symptoms have worsened.  She also is concerned about some bumps near her rectal area. Mild dysuria without  hematuria, urinary frequency, abdominal pain, back pain or fevers.  ROS per HPI      Past Medical History:  Diagnosis Date  . Adhesive capsulitis of shoulder   . Allergy   . Borderline personality disorder (HCC)   . Carpal tunnel syndrome   . COPD (chronic obstructive pulmonary disease) (HCC)   . Depression   . GERD (gastroesophageal reflux disease)   . Hypertension   . Type 2 diabetes mellitus [facility](HCC)     Patient Active Problem List   Diagnosis Date Noted  . Neuropathic pain 03/16/2019  . Nausea and vomiting 02/08/2019  . Tenesmus (rectal) 02/08/2019  . Subclinical hypothyroidism 01/23/2019  . Contact dermatitis 03/25/2017  . Chronic idiopathic constipation 01/05/2017  . Orthostatic hypotension 12/20/2016  . Anxiety 07/20/2016  . Hemorrhoids 11/27/2015  . Back pain 02/27/2015  . Osteosclerosis 07/30/2014  . Numbness and tingling in left arm 07/01/2014  . Diabetes mellitus without complication (HCC) 11/15/2012  . Fatigue 11/02/2011  . Otomycosis 09/24/2011  . Metabolic syndrome 08/19/2011  . Carpal tunnel syndrome 06/10/2010  . Allergic rhinitis 11/18/2008  . Osteoarthritis of multiple joints 07/30/2008  . Adhesive capsulitis of shoulder 01/15/2008  . Obesity 07/25/2007  . GERD 09/20/2006  . Major depressive disorder, recurrent  episode (HCC) 08/04/2006  . BORDERLINE PERSONALITY 08/04/2006  . HYPERTENSION, BENIGN SYSTEMIC 08/04/2006  . Irritable bowel syndrome 08/04/2006    Past Surgical History:  Procedure Laterality Date  . CHOLECYSTECTOMY    . TONSILLECTOMY      OB History   No obstetric history on file.      Home Medications    Prior to Admission medications   Medication Sig Start Date End Date Taking? Authorizing Provider  canagliflozin (INVOKANA) 300 MG TABS tablet Take 1 tablet (300 mg total) by mouth daily. 04/26/19  Yes Hensel, Santiago BumpersWilliam A, MD  carvedilol (COREG) 6.25 MG tablet Take 1 tablet (6.25 mg total) by mouth 2 (two) times daily with a meal. 05/17/19  Yes Hensel, Santiago BumpersWilliam A, MD  cetirizine (ZYRTEC) 10 MG tablet Take 10 mg by mouth daily.   Yes [provider]  gabapentin (NEURONTIN) 300 MG capsule Take 1 capsule (300 mg total) by mouth 3 (three) times daily. Take 1 tablet in the AM and at lunch then 2 tablets at bedtime 05/11/19  Yes Hensel, Santiago BumpersWilliam A, MD  losartan (COZAAR) 100 MG tablet Take 1 tablet (100 mg total) by mouth at bedtime. 05/17/19  Yes Hensel, Santiago BumpersWilliam A, MD  alum & mag hydroxide-simeth (MAALOX PLUS) 400-400-40 MG/5ML suspension Take 10 mLs by mouth every 6 (six) hours as needed for indigestion. Patient not taking: Reported on 05/17/2019 10/19/18   Peggyann ShoalsAnderson, Hannah C, DO  atorvastatin (LIPITOR) 40 MG tablet Take 1 tablet (40 mg total) by mouth daily. 05/17/19   Moses MannersHensel, William A, MD  esomeprazole (NEXIUM)  20 MG packet Take 20 mg by mouth daily before breakfast. 10/20/18   Linus Mako B, NP  fluconazole (DIFLUCAN) 150 MG tablet Take 1 tablet (150 mg total) by mouth daily. 05/17/19   Breaker Springer, Gloris Manchester A, NP  fluticasone (FLONASE) 50 MCG/ACT nasal spray Place 2 sprays into both nostrils daily. Patient not taking: Reported on 05/17/2019 10/19/18   Peggyann Shoals C, DO  hydrOXYzine (ATARAX/VISTARIL) 25 MG tablet Take 1 tablet (25 mg total) by mouth daily as needed. Patient not  taking: Reported on 05/17/2019 04/09/19   Westley Chandler, MD  ibuprofen (ADVIL) 600 MG tablet Take 1 tablet (600 mg total) by mouth every 8 (eight) hours as needed. 02/08/19   Garnette Gunner, MD  metFORMIN (GLUCOPHAGE-XR) 500 MG 24 hr tablet TAKE TWO TABLETS BY MOUTH TWICE PER DAY 05/17/19   Moses Manners, MD  nystatin cream (MYCOSTATIN) Apply to affected area 2 times daily 05/17/19   Courtnee Myer A, NP  psyllium (METAMUCIL) 58.6 % packet Take 1 packet by mouth daily.    [provider]  spironolactone (ALDACTONE) 25 MG tablet Take 1 tablet (25 mg total) by mouth at bedtime. 05/17/19   Moses Manners, MD  triamcinolone (NASACORT ALLERGY 24HR) 55 MCG/ACT AERO nasal inhaler Place 2 sprays into the nose daily. 05/17/19   Moses Manners, MD  clonazePAM (KLONOPIN) 0.5 MG tablet Take 1 tablet (0.5 mg total) by mouth 2 (two) times daily as needed for anxiety. 01/11/11 08/19/11  Reva Bores, MD  venlafaxine (EFFEXOR) 75 MG tablet Take 1 tablet (75 mg total) by mouth 2 (two) times daily. 03/15/11 08/19/11  Reva Bores, MD    Family History Family History  Problem Relation Age of Onset  . Diabetes Mother   . Hypertension Mother     Social History Social History   Tobacco Use  . Smoking status: Former Smoker    Packs/day: 0.25    Years: 30.00    Pack years: 7.50    Types: Cigarettes    Quit date: 08/13/2007    Years since quitting: 11.7  . Smokeless tobacco: Never Used  . Tobacco comment: boyfriend smokes  Substance Use Topics  . Alcohol use: No    Comment: rarely  . Drug use: No     Allergies   Ace inhibitors and Metformin and related   Review of Systems Review of Systems   Physical Exam Triage Vital Signs ED Triage Vitals  Enc Vitals Group     BP 05/17/19 1438 (!) 210/88     Pulse Rate 05/17/19 1438 70     Resp 05/17/19 1438 18     Temp 05/17/19 1438 98.5 F (36.9 C)     Temp Source 05/17/19 1438 Oral     SpO2 05/17/19 1438 99 %     Weight --       Height --      Head Circumference --      Peak Flow --      Pain Score 05/17/19 1444 0     Pain Loc --      Pain Edu? --      Excl. in GC? --    No data found.  Updated Vital Signs BP (!) 210/88 (BP Location: Right Arm)   Pulse 70   Temp 98.5 F (36.9 C) (Oral)   Resp 18   SpO2 99%   Visual Acuity Right Eye Distance:   Left Eye Distance:   Bilateral Distance:  Right Eye Near:   Left Eye Near:    Bilateral Near:     Physical Exam Vitals and nursing note reviewed.  Constitutional:      General: She is not in acute distress.    Appearance: Normal appearance. She is not ill-appearing, toxic-appearing or diaphoretic.  HENT:     Head: Normocephalic.     Nose: Nose normal.     Mouth/Throat:     Pharynx: Oropharynx is clear.  Eyes:     Conjunctiva/sclera: Conjunctivae normal.  Pulmonary:     Effort: Pulmonary effort is normal.  Abdominal:     Palpations: Abdomen is soft.     Tenderness: There is no abdominal tenderness.  Genitourinary:    Comments: Generalized patchy erythema to internal and external vaginal area.  Excoriations from scratching. Rectal area with skin tag but no external hemorrhoid No obvious discharge Musculoskeletal:        General: Normal range of motion.     Cervical back: Normal range of motion.  Skin:    General: Skin is warm and dry.     Findings: No rash.  Neurological:     Mental Status: She is alert.  Psychiatric:        Mood and Affect: Mood normal.      UC Treatments / Results  Labs (all labs ordered are listed, but only abnormal results are displayed) Labs Reviewed  POCT URINALYSIS DIP (DEVICE) - Abnormal; Notable for the following components:      Result Value   Glucose, UA 500 (*)    Hgb urine dipstick TRACE (*)    Protein, ur 100 (*)    All other components within normal limits    EKG   Radiology No results found.  Procedures Procedures (including critical care time)  Medications Ordered in UC Medications -  No data to display  Initial Impression / Assessment and Plan / UC Course  I have reviewed the triage vital signs and the nursing notes.  Pertinent labs & imaging results that were available during my care of the patient were reviewed by me and considered in my medical decision making (see chart for details).     Patient is a 58 year old female with not well controlled diabetes with vaginal itching, irritation and redness.  Has been using Monistat without any relief.  Most likely based on exam and symptoms this is a yeast infection.  Will treat with Diflucan and nystatin cream.  Patient very hypertensive today in clinic.  Recommended take her blood pressure medication when she gets home and recheck Follow up as needed for continued or worsening symptoms  Final Clinical Impressions(s) / UC Diagnoses   Final diagnoses:  Vaginitis and vulvovaginitis     Discharge Instructions     Treating you for a yeast infection Take the medication as prescribed Make sure you take your blood pressure medication when you get home and recheck your blood pressure.      ED Prescriptions    Medication Sig Dispense Auth. Provider   fluconazole (DIFLUCAN) 150 MG tablet Take 1 tablet (150 mg total) by mouth daily. 2 tablet Hussain Maimone A, NP   nystatin cream (MYCOSTATIN) Apply to affected area 2 times daily 30 g Gor Vestal A, NP     PDMP not reviewed this encounter.   Orvan July, NP 05/21/19 1133

## 2019-05-22 ENCOUNTER — Telehealth: Payer: Self-pay | Admitting: Pharmacist

## 2019-05-22 NOTE — Telephone Encounter (Signed)
Called pt on 05/22/2019 at 3:21PM and left HIPAA-compliant VM with instructions to call Family Medicine clinic back   Plan to discuss answers to medication-related questions  Drexel Iha, PharmD PGY2 Ambulatory Care Pharmacy Resident

## 2019-05-22 NOTE — Telephone Encounter (Signed)
Medication Question 1: Will any of my prescription medications interact with an herbal supplement named Nerve Control 911?  Interactions Nerve Control 911 contains passiflora incarnate, althea officinalis, corydalis yanhuosuo, opuntia phaeacantha, and eschscholzia californica. Based on natural medicines medication interaction tool the following moderate medication interactions have been identified: 1) Marshmallow and all oral medications: mucilage in marshmallow might impair absorption of oral drugs 2) Canagliflozin/metformin and marshmallow flower: Animal research suggests that marshmallow can have hypoglycemic effect 3) Canagliflozin/metformin and prickly pear cactus: Case reports show that combining prickly pear cactus with the antidiabetes drugs chlorpropamide, glyburide, glipizide, metformin, or a combination of these drugs, can result in additive blood glucose lowering and hypoglycemia in patients with type 2 diabetes 4)  Gabapentin and passionflower/califlornia poppy: Passion flower and [place] poppy seems to have sedative effects Theoretically, concomitant use of passion flower with alcohol, benzodiazepines, opioids, and other sedative drugs might cause additive effects and side effects. 5) Ibuprofen and marshmallow flower: Animal research suggests that marshmallow flower extract has antiplatelet effects  Answer:  It appears that ingredients within the herbal supplement named Nerve Control 911 may impair absorption of other oral medications, increase risk of hypoglycemia, increase somnolence when used in addition to CNS depressants, and increase platelet effects. Based on this information I would be hesitant to start this medication due to how it is challenging to determine how significant the effect of lack of absorption will be for each medication. Will discuss the risk of possible lack of absorption with patient. While the risk of hypoglycemia may be acceptable since patient is not taking  insulin and/or sulfonylurea and does not currently struggle with managing hypoglycemia, will plan for patient to monitor if she experiences an increase in hypoglycemic episodes and plan to discontinue herbal supplement if she does. I would recommend against taking Nerve Control 911 and gabapentin concomitantly due to risk of increase in CNS effects as well as taking Nerve Control 911 and ibuprofen concomitantly due to risk of antiplatelet effects.   Medication Question 2: Will any of my prescription medications interact with an herbal supplement named Omega XL?  Interactions: Omega XL contains perna canaliculus, vitamin E, omega fatty acids, and olive oil. It does not state how much EPA and/or DPA is within the supplement. Based on natural medicines medication interaction tool the following moderate medication interactions have been identified: 1) Atorvastatin and vitamin E: Theoretically, vitamin E might increase metabolism of drugs metabolized by cytochrome P450 3A4. Vitamin E appears to bind with the nuclear receptor, pregnane X receptor (PXR), which results in increased expression of CYP3A4. Although the clinical significance of this is not known, use caution when considering concomitant use of vitamin E and other drugs affected by these enzymes.  2) Spironolactone/carvedilol and perna canaliculus: Theoretically, concomitant use with olive may enhance blood pressure-lowering effects.  3) Canagliflozin/metformin and perna canaliculus: Theoretically, concomitant use with olive might enhance blood glucose. lowering effect  Answer It appears Omega XL may increase metabolism of atorvastatin (which may lead to decreased concentrations within the body), increase risk of hypotension, and increase risk of hypoglycemia. Discuss with patient how it may be appropriate to monitor patient's lipid panel more closely on this medication to determine if a dose increase is required or not. It is hard to determine how  significantly this medication will effect blood pressure and blood glucose levels. If patient experiences an increase in hypotension/hypoglycemia then plan for patient to discontinue herbal supplement.  Plan to call patient within the next few days to discuss  information regarding herbal supplements and assess if patient is still willing to start these herbal supplement products.   Drexel Iha, PharmD PGY2 Ambulatory Care Pharmacy Resident

## 2019-05-23 NOTE — Telephone Encounter (Signed)
Called patient on 05/23/2019  Discussed with patient answers to medication related questions, [place] [facility] prescriptions, and BP management.  1. Nerve Control 911: For Nerve Control 911, patient accepts risk of possible decreased absorption of other prescription medications (voiced my concerns regarding there may be lack of efficacy of other medications if there is decreased absorption) and patient accepts risk of hypoglycemia (she states she is not worried about hypoglycemic episodes considering how she says she has not experienced hypoglycemic in 5 years). Patient confirms she will not take Nerve Control 911 with gabapentin or ibuprofen. She has not initiated Nerve Control 911 at this time and is planning to save her money since it costs ~$70.00.  2. Omega XL: As for Omega XL, there appears to be confusion. Patient states her mother and boyfriend were planning on taking Omega XL (to manage joint pain). Discussed with patient I was not aware of their prescription medications, however, since I assessed Omega XL use with her medications it appears it may interact with DM/HTN medications and increase risk of hypoglycemia/hypotension. Also Omega XL, may make statins less effective. It is not possible to know if Omega XL will effect other medications her mother/boyfriend are on since patient is unaware of their other medications as well. Defer these questions to mother/boyfriend's healthcare professionals. 3. [facility] prescriptions: Patient received metformin, carvedilol

## 2019-05-23 NOTE — Telephone Encounter (Signed)
Called patient on 05/23/2019  Discussed with patient answers to medication related questions, [place] [facility] prescriptions, and hypertension management:  1. Nerve Control 911: For Nerve Control 911, patient accepts risk of possible decreased absorption of other prescription medications (voiced my concerns regarding there may be lack of efficacy of other medications if there is decreased absorption) and patient accepts risk of hypoglycemia (she states she is not worried about hypoglycemic episodes considering how she says she has not experienced hypoglycemic in 5 years). Patient confirms she will not take Nerve Control 911 with gabapentin or ibuprofen. She has not initiated Nerve Control 911 at this time and is planning to save her money (costs ~$70.00) to start at a later date.   2. Omega XL: As for Omega XL, there appears to be confusion. Patient states her mother and boyfriend were planning on taking Omega XL (to manage joint pain). Discussed with patient I was not aware of this and thought she was planning on taking Omega XL. However, since I assessed Omega XL use with her medications it appears it may interact with DM/HTN medications and increase risk of hypoglycemia/hypotension. Also Omega XL, may make statins less effective. It is not possible to know if Omega XL will effect other medications her mother/boyfriend are on since patient is unaware of their other medications as well. Defer these questions to mother/boyfriend's healthcare professionals.  3. [facility] prescriptions: Patient received metformin, carvedilol, and losartan via the mail from [facility]. Asked patient if she would like [facility] phone number to discuss when she will receive other medications. Patient politely denied considering she already has [facility] phone number.  4. Hypertension: Patient states when she went to the ED after pharmacy appt (05/17/2019) her BP was ~212/100 although she had taken clonidine 0.1 mg once at  pharmacy appointment. Patient states she is currently at her boyfriend's house and does not have her BP log on her. She thinks her blood pressure has ranged 778-242 systolic and diastolic is ~35T since 61/44/3154. She could not remember her pulse. She has concerns regarding the efficacy of her blood pressure machine and ability to monitor her BP. She is tolerating recent initiation of spironolactone (purchased 30-day supply from [facility] and should be receiving remaining refills via [facility]). She continues to tolerate losartan and carvedilol. Discussed with patient she has appt with Dr. Owens Shark on 05/28/2019 and to discuss BP management with her as well. She states she does not have capability for virtual video appt and will need to be contacted by phone. Encouraged patient to continue monitoring BP readings and pulse, to continue BP medication regimen, and to schedule an in-person office visit with Dr. Owens Shark or Dr. Valentina Lucks within this upcoming month to re-assess BP control/calibrate BP machine/obtain labs to assess Scr/K. Patient verbalized understanding.  Also, she would like me to relay to Dr. Owens Shark and Dr. Valentina Lucks that she hopes they and their families have a very Merry Christmas.  Thank you for involving pharmacy to assist in providing Ms. Kerman's care.   Drexel Iha, PharmD PGY2 Ambulatory Care Pharmacy Resident

## 2019-05-24 ENCOUNTER — Other Ambulatory Visit: Payer: 59

## 2019-05-24 ENCOUNTER — Ambulatory Visit: Payer: 59 | Admitting: Pharmacist

## 2019-05-28 ENCOUNTER — Other Ambulatory Visit: Payer: Self-pay

## 2019-05-28 ENCOUNTER — Telehealth (INDEPENDENT_AMBULATORY_CARE_PROVIDER_SITE_OTHER): Payer: Self-pay | Admitting: Family Medicine

## 2019-05-28 ENCOUNTER — Encounter: Payer: Self-pay | Admitting: Family Medicine

## 2019-05-28 DIAGNOSIS — I10 Essential (primary) hypertension: Secondary | ICD-10-CM

## 2019-05-28 DIAGNOSIS — M792 Neuralgia and neuritis, unspecified: Secondary | ICD-10-CM

## 2019-05-28 DIAGNOSIS — E119 Type 2 diabetes mellitus without complications: Secondary | ICD-10-CM

## 2019-05-28 NOTE — Assessment & Plan Note (Signed)
Congratulated on improvement, encouraged continued dietary changes.

## 2019-05-28 NOTE — Assessment & Plan Note (Signed)
Discussed at length, will send information regarding contents of nerve 911.

## 2019-05-28 NOTE — Progress Notes (Signed)
[place] Telemedicine Visit  Patient consented to have virtual visit. Method of visit: Telephone  Encounter participants: Patient: Carmen [name] - located at home  Provider: Martyn Malay - located at [facility]  Others (if applicable): none   Chief Complaint: nerve pain   HPI: Carmen [name] is a pleasant 51 year with history of type 2 diabetes, hypertension, anxiety, and obesity presenting for check in over phone for several concerns.   Blood pressure Patient recently prescribed spironolactone, has not yet started this. She was also out of carvedilol. Recently restarted. BP ranges 160-180/90-110. No chest pain, vision changes or dyspnea. Headaches are intermittent, similar to prior. Reports taking ibuprofen several times a day (or more) for headache.   Type 2 Diabetes Congratulated on improved A1C. Patient reports she has been cooking a lot (today blueberry pound cheesecake). Denies polyuria or polydipsia. Not regularly checking BG.   Neuropathic Pain Patient reports gabapentin helps but interested in nerve 911. She is uncertain of ingredients.   ROS: per HPI  Pertinent PMHx:  Type 2 diabetes Hypertension Obesity Osteoarthritis Anxiety   Exam:  Respiratory: Speaking in full sentences, talking without pause   Assessment/Plan:  HYPERTENSION, BENIGN SYSTEMIC Patient continues to have elevated BP. Encouraged her to start spironolactone. She will have BMP 2 weeks after starting. Discussed cessation of NSAIDs.   Neuropathic pain Discussed at length, will send information regarding contents of nerve 911.   Diabetes mellitus without complication ([place]) Congratulated on improvement, encouraged continued dietary changes.     Time spent during visit with patient: 12 minutes

## 2019-05-28 NOTE — Assessment & Plan Note (Addendum)
Patient continues to have elevated BP. Encouraged her to start spironolactone. She will have BMP 2 weeks after starting. Discussed cessation of NSAIDs.

## 2019-06-04 ENCOUNTER — Telehealth: Payer: Self-pay

## 2019-06-04 ENCOUNTER — Ambulatory Visit (HOSPITAL_COMMUNITY)
Admission: EM | Admit: 2019-06-04 | Discharge: 2019-06-04 | Disposition: A | Discharge status: Home / Self Care | Payer: Self-pay

## 2019-06-04 ENCOUNTER — Other Ambulatory Visit: Payer: Self-pay

## 2019-06-04 ENCOUNTER — Encounter (HOSPITAL_COMMUNITY): Payer: Self-pay

## 2019-06-04 DIAGNOSIS — I10 Essential (primary) hypertension: Secondary | ICD-10-CM

## 2019-06-04 NOTE — ED Provider Notes (Signed)
[facility]    CSN: [id number] Arrival date & time: 06/04/19  1232      History   Chief Complaint Chief Complaint  Patient presents with  . Hypertension    HPI Carmen [name] is a 58 y.o. female.   Patient reports to urgent care today for concern of recent high blood pressure readings at home. She reports readings between 180-200/100s over the last few weeks. She denies chest pain, shortness of breath, slurred speech or other stroke like symptoms. She does endorse generally not feeling well when her blood pressure is high. She reports occasional headache. She reports occasional floaters and endorses a history of these that dates before her recent blood pressure problems.   She reports she has not been consistently taking her carvedilol and was recently started on spironolactone.  She was seen via telehealth by her primary care on 12/21 and her Blood pressures were discussed then as well. She was encouraged to take her medications.      Past Medical History:  Diagnosis Date  . Adhesive capsulitis of shoulder   . Allergy   . Borderline personality disorder (HCC)   . Carpal tunnel syndrome   . COPD (chronic obstructive pulmonary disease) (HCC)   . Depression   . GERD (gastroesophageal reflux disease)   . Hypertension   . Type 2 diabetes mellitus [facility])     Patient Active Problem List   Diagnosis Date Noted  . Neuropathic pain 03/16/2019  . Nausea and vomiting 02/08/2019  . Tenesmus (rectal) 02/08/2019  . Contact dermatitis 03/25/2017  . Chronic idiopathic constipation 01/05/2017  . Orthostatic hypotension 12/20/2016  . Anxiety 07/20/2016  . Hemorrhoids 11/27/2015  . Back pain 02/27/2015  . Osteosclerosis 07/30/2014  . Numbness and tingling in left arm 07/01/2014  . Diabetes mellitus without complication (HCC) 11/15/2012  . Fatigue 11/02/2011  . Otomycosis 09/24/2011  . Metabolic syndrome 08/19/2011  . Carpal tunnel syndrome 06/10/2010  . Allergic  rhinitis 11/18/2008  . Osteoarthritis of multiple joints 07/30/2008  . Adhesive capsulitis of shoulder 01/15/2008  . Obesity 07/25/2007  . GERD 09/20/2006  . Major depressive disorder, recurrent episode (HCC) 08/04/2006  . BORDERLINE PERSONALITY 08/04/2006  . HYPERTENSION, BENIGN SYSTEMIC 08/04/2006  . Irritable bowel syndrome 08/04/2006    Past Surgical History:  Procedure Laterality Date  . CHOLECYSTECTOMY    . TONSILLECTOMY      OB History   No obstetric history on file.      Home Medications    Prior to Admission medications   Medication Sig Start Date End Date Taking? Authorizing Provider  alum & mag hydroxide-simeth (MAALOX PLUS) 400-400-40 MG/5ML suspension Take 10 mLs by mouth every 6 (six) hours as needed for indigestion. Patient not taking: Reported on 05/17/2019 10/19/18   Peggyann Shoals C, DO  atorvastatin (LIPITOR) 40 MG tablet Take 1 tablet (40 mg total) by mouth daily. 05/17/19   Moses Manners, MD  canagliflozin (INVOKANA) 300 MG TABS tablet Take 1 tablet (300 mg total) by mouth daily. 04/26/19   Moses Manners, MD  carvedilol (COREG) 6.25 MG tablet Take 1 tablet (6.25 mg total) by mouth 2 (two) times daily with a meal. 05/17/19   Hensel, Santiago Bumpers, MD  cetirizine (ZYRTEC) 10 MG tablet Take 10 mg by mouth daily.    [provider]  esomeprazole (NEXIUM) 20 MG packet Take 20 mg by mouth daily before breakfast. 10/20/18   Linus Mako B, NP  fluconazole (DIFLUCAN) 150 MG tablet Take 1  tablet (150 mg total) by mouth daily. 05/17/19   Bast, Gloris Manchesterraci A, NP  fluticasone (FLONASE) 50 MCG/ACT nasal spray Place 2 sprays into both nostrils daily. Patient not taking: Reported on 05/17/2019 10/19/18   Peggyann ShoalsAnderson, Hannah C, DO  gabapentin (NEURONTIN) 300 MG capsule Take 1 capsule (300 mg total) by mouth 3 (three) times daily. Take 1 tablet in the AM and at lunch then 2 tablets at bedtime 05/11/19   Moses MannersHensel, William A, MD  hydrOXYzine (ATARAX/VISTARIL) 25 MG tablet  Take 1 tablet (25 mg total) by mouth daily as needed. Patient not taking: Reported on 05/17/2019 04/09/19   Westley ChandlerBrown, Carina M, MD  ibuprofen (ADVIL) 600 MG tablet Take 1 tablet (600 mg total) by mouth every 8 (eight) hours as needed. 02/08/19   Garnette Gunnerhompson, Aaron B, MD  losartan (COZAAR) 100 MG tablet Take 1 tablet (100 mg total) by mouth at bedtime. 05/17/19   Moses MannersHensel, William A, MD  metFORMIN (GLUCOPHAGE-XR) 500 MG 24 hr tablet TAKE TWO TABLETS BY MOUTH TWICE PER DAY 05/17/19   Moses MannersHensel, William A, MD  nystatin cream (MYCOSTATIN) Apply to affected area 2 times daily 05/17/19   Bast, Traci A, NP  psyllium (METAMUCIL) 58.6 % packet Take 1 packet by mouth daily.    [provider]  spironolactone (ALDACTONE) 25 MG tablet Take 1 tablet (25 mg total) by mouth at bedtime. 05/17/19   Moses MannersHensel, William A, MD  triamcinolone (NASACORT ALLERGY 24HR) 55 MCG/ACT AERO nasal inhaler Place 2 sprays into the nose daily. 05/17/19   Moses MannersHensel, William A, MD  clonazePAM (KLONOPIN) 0.5 MG tablet Take 1 tablet (0.5 mg total) by mouth 2 (two) times daily as needed for anxiety. 01/11/11 08/19/11  Reva BoresPratt, Tanya S, MD  venlafaxine (EFFEXOR) 75 MG tablet Take 1 tablet (75 mg total) by mouth 2 (two) times daily. 03/15/11 08/19/11  Reva BoresPratt, Tanya S, MD    Family History Family History  Problem Relation Age of Onset  . Diabetes Mother   . Hypertension Mother   . Alzheimer's disease Father   . Dementia Father     Social History Social History   Tobacco Use  . Smoking status: Former Smoker    Packs/day: 0.25    Years: 30.00    Pack years: 7.50    Types: Cigarettes    Quit date: 08/13/2007    Years since quitting: 11.8  . Smokeless tobacco: Never Used  . Tobacco comment: boyfriend smokes  Substance Use Topics  . Alcohol use: No    Comment: rarely  . Drug use: No     Allergies   Ace inhibitors and Metformin and related   Review of Systems Review of Systems  Constitutional: Negative for chills and fever.  HENT:  Negative for sore throat.   Eyes: Positive for visual disturbance (occasional floaters). Negative for pain.  Respiratory: Negative for cough, chest tightness and shortness of breath.   Cardiovascular: Negative for chest pain, palpitations and leg swelling.  Gastrointestinal: Negative for abdominal pain and vomiting.  Endocrine: Positive for polyuria.  Genitourinary: Negative for dysuria and hematuria.  Musculoskeletal: Negative for arthralgias, back pain and myalgias.  Skin: Negative for color change and rash.  Neurological: Positive for headaches. Negative for dizziness, seizures, syncope, facial asymmetry, speech difficulty, weakness, light-headedness and numbness.  All other systems reviewed and are negative.    Physical Exam Triage Vital Signs ED Triage Vitals  Enc Vitals Group     BP 06/04/19 1342 (!) 157/103     Pulse Rate 06/04/19  1342 91     Resp 06/04/19 1342 19     Temp 06/04/19 1342 98 F (36.7 C)     Temp Source 06/04/19 1342 Oral     SpO2 06/04/19 1342 100 %     Weight 06/04/19 1338 202 lb 12.8 oz (92 kg)     Height --      Head Circumference --      Peak Flow --      Pain Score 06/04/19 1336 0     Pain Loc --      Pain Edu? --      Excl. in [place]? --    No data found.  Updated Vital Signs BP (!) 157/103 (BP Location: Left Wrist)   Pulse 91   Temp 98 F (36.7 C) (Oral)   Resp 19   Wt 202 lb 12.8 oz (92 kg)   SpO2 100%   BMI 36.50 kg/m   Visual Acuity Right Eye Distance:   Left Eye Distance:   Bilateral Distance:    Right Eye Near:   Left Eye Near:    Bilateral Near:     Physical Exam Vitals and nursing note reviewed.  Constitutional:      General: She is not in acute distress.    Appearance: Normal appearance. She is well-developed and normal weight.  HENT:     Head: Normocephalic and atraumatic.     Nose: Nose normal.  Eyes:     Conjunctiva/sclera: Conjunctivae normal.     Pupils: Pupils are equal, round, and reactive to light.    Cardiovascular:     Rate and Rhythm: Normal rate and regular rhythm.     Pulses: Normal pulses.     Heart sounds: Normal heart sounds. No murmur. No friction rub. No gallop.   Pulmonary:     Effort: Pulmonary effort is normal. No respiratory distress.     Breath sounds: Normal breath sounds. No stridor. No wheezing, rhonchi or rales.  Abdominal:     Palpations: Abdomen is soft.     Tenderness: There is no abdominal tenderness. There is no right CVA tenderness or left CVA tenderness.  Musculoskeletal:     Cervical back: Neck supple.     Right lower leg: No edema.     Left lower leg: No edema.  Skin:    General: Skin is warm and dry.  Neurological:     General: No focal deficit present.     Mental Status: She is alert and oriented to person, place, and time.  Psychiatric:        Mood and Affect: Mood normal.        Behavior: Behavior normal.        Thought Content: Thought content normal.        Judgment: Judgment normal.      UC Treatments / Results  Labs (all labs ordered are listed, but only abnormal results are displayed) Labs Reviewed - No data to display  EKG   Radiology No results found.  Procedures Procedures (including critical care time)  Medications Ordered in UC Medications - No data to display  Initial Impression / Assessment and Plan / UC Course  I have reviewed the triage vital signs and the nursing notes.  Pertinent labs & imaging results that were available during my care of the patient were reviewed by me and considered in my medical decision making (see chart for details).     Hypertension - Patient has not been compliant with home medication  regiment. 157/103 in clinic today without symptoms. Neurologically well. Instructed to restart home medications and follow up with primary care in 1-2 weeks for re-evaluation and dosage discussions    Final Clinical Impressions(s) / UC Diagnoses   Final diagnoses:  Essential hypertension      Discharge Instructions     Restart your carvedilol today and continue taking your blood pressure medications as prescribed.  Follow up with your primary care to have you blood pressure re-checked within 1-2 weeks.  Go directly to the Emergency Department or call 911 if you have severe chest pain, shortness of breath, have slurred speech, worst headache, severe visual changes.     ED Prescriptions    None     PDMP not reviewed this encounter.   Hermelinda Medicus, PA-C 06/04/19 1523

## 2019-06-04 NOTE — Telephone Encounter (Signed)
Patient calls nurse line stating her blood pressure has been elevated since Christmas Day. Patients stated her BP has been in the upwards of 180/110, this am has improved. Patient states she has had a headache and "floaters." Pateint stated she has been taking losartan, however not carvedilol. Patients carvedilol is at her mothers and she is at her boyfriend. Patient encouraged to take all medications. Patient advised to go to ED or urgent care, as we have no apts today. Patient stated, "I want to speak with Owens Shark I informed her Owens Shark is on vacation. Patient stated she will try to go to ED, apt offered for tomorrow, patient declined.

## 2019-06-04 NOTE — ED Triage Notes (Addendum)
Pt. States for the last few weeks now, her BP has been hIgh. States she did stop taken her BP meds cause she has been at her bf's house.

## 2019-06-04 NOTE — Discharge Instructions (Signed)
Restart your carvedilol today and continue taking your blood pressure medications as prescribed.  Follow up with your primary care to have you blood pressure re-checked within 1-2 weeks.  Go directly to the Emergency Department or call 911 if you have severe chest pain, shortness of breath, have slurred speech, worst headache, severe visual changes.

## 2019-06-11 ENCOUNTER — Ambulatory Visit (HOSPITAL_COMMUNITY)
Admission: EM | Admit: 2019-06-11 | Discharge: 2019-06-11 | Disposition: A | Discharge status: Home / Self Care | Payer: HRSA Program | Attending: Family Medicine | Admitting: Family Medicine

## 2019-06-11 ENCOUNTER — Encounter: Payer: Self-pay | Admitting: Family Medicine

## 2019-06-11 ENCOUNTER — Telehealth (INDEPENDENT_AMBULATORY_CARE_PROVIDER_SITE_OTHER): Payer: Self-pay | Admitting: Family Medicine

## 2019-06-11 ENCOUNTER — Telehealth: Payer: Self-pay | Admitting: Family Medicine

## 2019-06-11 ENCOUNTER — Encounter (HOSPITAL_COMMUNITY): Payer: Self-pay

## 2019-06-11 ENCOUNTER — Other Ambulatory Visit: Payer: Self-pay

## 2019-06-11 DIAGNOSIS — I10 Essential (primary) hypertension: Secondary | ICD-10-CM | POA: Diagnosis present

## 2019-06-11 DIAGNOSIS — Z20822 Contact with and (suspected) exposure to covid-19: Secondary | ICD-10-CM

## 2019-06-11 NOTE — Progress Notes (Signed)
Patient presented for office visit. She reports COVID19 exposures X2. Works at [employer], recommended testing. Patient would like to go to Urgent Care. Recommended testing [facility]. Denies chest pain, dyspnea.  Terisa Starr, MD  Family Medicine Teaching Service

## 2019-06-11 NOTE — ED Triage Notes (Signed)
Pt here for covid testing after several contacts tested positive. Pt states she has no symptoms but her mother, who she lives with sometimes has a cough.

## 2019-06-11 NOTE — ED Provider Notes (Signed)
[facility]   [id number] 06/11/19 Arrival Time: 1048  ASSESSMENT & PLAN:  1. Exposure to COVID-19 virus   2. Uncontrolled hypertension     No s/s of hypertensive urgency. COVID-19 testing sent. To self-quarantine until results are available. See work note for instructions.  Follow-up Information    Reva Bores, MD.   Specialty: Obstetrics and Gynecology Why: As needed. Contact information: [address] [place] [address] [phone]          She may f/u to check BP here at any time.  Reviewed expectations re: course of current medical issues. Questions answered. Outlined signs and symptoms indicating need for more acute intervention. Patient verbalized understanding. After Visit Summary given.   SUBJECTIVE: History from: patient. Carmen [name] is a 59 y.o. female who requests COVID-19 testing. Known COVID-19 contact: "several people I was exposed to tested positive including my boyfriend". Recent travel: none. Denies: runny nose, congestion, fever, cough, sore throat, difficulty breathing and headache. Normal PO intake without n/v/d.  Increased blood pressure noted today. Reports that she is treated for HTN. This has been a long-standing problem with her pressures reportedly very difficult to control.  She reports taking medications as instructed, no chest pain on exertion, no dyspnea on exertion, no swelling of ankles, no orthostatic dizziness or lightheadedness, no orthopnea or paroxysmal nocturnal dyspnea, no palpitations and no intermittent claudication symptoms.  ROS: As per HPI.   OBJECTIVE:  Vitals:   06/11/19 1158  BP: (!) 197/100  Pulse: 88  Resp: 16  Temp: 98.1 F (36.7 C)  TempSrc: Oral  SpO2: 100%    General appearance: alert; no distress Eyes: PERRLA; EOMI; conjunctiva normal HENT: [place]; AT; nasal mucosa normal; oral mucosa normal Neck: supple  Lungs: speaks full sentences without difficulty; unlabored Heart:  regular rate and rhythm Abdomen: soft, non-tender Extremities: no edema Skin: warm and dry Neurologic: normal gait Psychological: alert and cooperative; normal mood and affect  Labs:  Labs Reviewed  NOVEL CORONAVIRUS, NAA (HOSP ORDER, SEND-OUT TO REF LAB; TAT 18-24 HRS)      Allergies  Allergen Reactions  . Ace Inhibitors Cough  . Metformin And Related Diarrhea    Able to tolerate XR with only mild symptoms     Past Medical History:  Diagnosis Date  . Adhesive capsulitis of shoulder   . Allergy   . Borderline personality disorder (HCC)   . Carpal tunnel syndrome   . COPD (chronic obstructive pulmonary disease) (HCC)   . Depression   . GERD (gastroesophageal reflux disease)   . Hypertension   . Type 2 diabetes mellitus (HCC)    Social History   Socioeconomic History  . Marital status: Divorced    Spouse name: Not on file  . Number of children: Not on file  . Years of education: Not on file  . Highest education level: Not on file  Occupational History  . Not on file  Tobacco Use  . Smoking status: Former Smoker    Packs/day: 0.25    Years: 30.00    Pack years: 7.50    Types: Cigarettes    Quit date: 08/13/2007    Years since quitting: 11.8  . Smokeless tobacco: Never Used  . Tobacco comment: boyfriend smokes  Substance and Sexual Activity  . Alcohol use: No    Comment: rarely  . Drug use: No  . Sexual activity: Yes    Birth control/protection: None  Other Topics Concern  . Not on file  Social History Narrative   Works at [employer].    Lives at [place] [facility] home, boyfriend lives in [place].    Has several casts.    Social Determinants of Health   Financial Resource Strain:   . Difficulty of Paying Living Expenses: Not on file  Food Insecurity:   . Worried About Charity fundraiser in the Last Year: Not on file  . Ran Out of Food in the Last Year: Not on file  Transportation Needs:   . Lack of Transportation (Medical): Not on file  . Lack of  Transportation (Non-Medical): Not on file  Physical Activity:   . Days of Exercise per Week: Not on file  . Minutes of Exercise per Session: Not on file  Stress:   . Feeling of Stress : Not on file  Social Connections:   . Frequency of Communication with Friends and Family: Not on file  . Frequency of Social Gatherings with Friends and Family: Not on file  . Attends Religious Services: Not on file  . Active Member of Clubs or Organizations: Not on file  . Attends Archivist Meetings: Not on file  . Marital Status: Not on file  Intimate Partner Violence:   . Fear of Current or Ex-Partner: Not on file  . Emotionally Abused: Not on file  . Physically Abused: Not on file  . Sexually Abused: Not on file   Family History  Problem Relation Age of Onset  . Diabetes Mother   . Hypertension Mother   . Alzheimer's disease Father   . Dementia Father    Past Surgical History:  Procedure Laterality Date  . CHOLECYSTECTOMY    . Evonnie Dawes, MD 06/11/19 1216

## 2019-06-11 NOTE — Discharge Instructions (Addendum)
If your Covid-19 test is positive, you will receive a phone call from [facility] regarding your results. Negative test results are not called. Both positive and negative results area always visible on MyChart. If you do not have a MyChart account, sign up instructions are in your discharge papers.  

## 2019-06-14 LAB — NOVEL CORONAVIRUS, NAA (HOSP ORDER, SEND-OUT TO REF LAB; TAT 18-24 HRS): SARS-CoV-2, NAA: NOT DETECTED

## 2019-06-15 ENCOUNTER — Encounter: Payer: Self-pay | Admitting: Family Medicine

## 2019-06-15 ENCOUNTER — Telehealth: Payer: Self-pay | Admitting: Family Medicine

## 2019-06-15 ENCOUNTER — Telehealth: Payer: Self-pay | Admitting: Pharmacist

## 2019-06-15 DIAGNOSIS — E119 Type 2 diabetes mellitus without complications: Secondary | ICD-10-CM

## 2019-06-15 MED ORDER — GABAPENTIN 300 MG PO CAPS: 300.0000 mg | ORAL_CAPSULE | Freq: Three times a day (TID) | ORAL | 3 refills | Status: DC

## 2019-06-15 NOTE — Telephone Encounter (Signed)
Called patient on 06/15/2019 at 4:42 PM and left HIPAA-compliant VM with instructions to call Family Medicine clinic back   Patient's BP was noted to be significantly elevated at prior PharmD appt. Since appt patient has seen PCP, Dr. Manson Passey. At her appt with Dr. Manson Passey on 12/21 she had not started taking her spironolactone prescribed at PharmD appt. Dr. Manson Passey recommended she started taking spironolactone. BMP was scheduled for 06/11/2019 however patient had been exposed to COVID-19 therefore Dr. Manson Passey recommended pt get COVID-19 tested. Patient's COVID-19 test was negative. Patient is awaiting results of her boyfriend's COVID-19 test. Plan to discuss with patient importance of re-scheduling her appt with PCP or PharmD for HTN management as soon as boyfriend's COVID-19 test results are available (as long as they are negative) due to concern for her elevated BP.   Thank you for involving pharmacy to assist in providing Ms.Dietz's care.    Zachery Conch, PharmD PGY2 Ambulatory Care Pharmacy Resident

## 2019-06-15 NOTE — Telephone Encounter (Signed)
Called patient to discuss.  Neither her nor her boyfriend have had any symptoms of Covid.  Last contact with individuals 10 days ago now  Boyfriend has not yet received the results of his test.  Instructed patient to call back when he received the results of his test.  When both are negative she can return to work.  Letter printed will place up front for patient to pick up on Monday.  Terisa Starr, MD  Family Medicine Teaching Service

## 2019-06-15 NOTE — Telephone Encounter (Signed)
Patient calls for 2 reasons.  1. Patient was tested for COVID 06-11-2019 and it came back negative. Patient needs a note to return to work the week after next.   2. Patient also says she needs a RX called in, but needs to discuss the medication with Dr. Manson Passey before calling it in. Please call patient back at [phone] to discuss this.   Routing to Dr. Manson Passey who has seeing patient recently.

## 2019-06-21 ENCOUNTER — Ambulatory Visit (INDEPENDENT_AMBULATORY_CARE_PROVIDER_SITE_OTHER): Payer: Self-pay | Admitting: Pharmacist

## 2019-06-21 ENCOUNTER — Other Ambulatory Visit: Payer: Self-pay

## 2019-06-21 DIAGNOSIS — I10 Essential (primary) hypertension: Secondary | ICD-10-CM

## 2019-06-21 DIAGNOSIS — E119 Type 2 diabetes mellitus without complications: Secondary | ICD-10-CM

## 2019-06-21 MED ORDER — SPIRONOLACTONE 25 MG PO TABS: 25.0000 mg | ORAL_TABLET | Freq: Every day | ORAL | 11 refills | Status: DC

## 2019-06-21 NOTE — Patient Instructions (Signed)
Nice to see you today.   Your blood pressure and blood sugar are improving. Continue taking all medications as previously prescribed.   Try to decrease your intake of caffeine by drinking half of your current intake.   Please continue with healthier choices you have been making!  Focus on salty foods to decrease intake.    Please keep you visit with Dr. Manson Passey in a week.

## 2019-06-21 NOTE — Assessment & Plan Note (Signed)
Diabetes longstanding controlled based on most recent HbA1c reading, however, fasting BG readings appear to have elevated since last appt. Patient reports adherent with medication. Control is suboptimal due to diet and stress. -Focus on eating healthier diet and exercising  -Continue metformin 1000 mg BID  -Continued SGLT2-I Invokana (generic name canagliflozin) 300 mg daily. -Extensively discussed pathophysiology of diabetes, recommended lifestyle interventions, dietary effects on blood sugar control -Counseled on s/sx of and management of hypoglycemia -Next A1C anticipated 05/2020

## 2019-06-21 NOTE — Progress Notes (Deleted)
S:     Chief Complaint  Patient presents with  . Medication Management    HTN/DM    Patient arrives ambulating without assistance. She reports she has been feeling more angry lately. She is concerned because she feels she has been losing weight too quickly recently. Presents for diabetes evaluation, education, and management. Patient was last seen and evaluated by Primary Care Provider, Dr. Manson Passey, on 05/30/2019.  Patient denies NSAID use. Takes pseudoephedrine "once in a blue moon" and has not taken it in a while. She adds "a dash of salt' to food. She denies eating microwave meals/frozen foods and eating canned vegetables. She eats fast food daily - specifically [employer] breakfast. Food she enjoys to eat for breakfast at [employer] sausage egg mcmuffin, hashbrown, sausage mcgriddle, and biscuits and gravy with egg on the side.  She eats at [employer] a lot for breakfast - specifically spinach and mushroom omelette with wheat toast (specifies that she eats there 2-3x per week for breakfast and once per week for dinner). She reports increase in stress and feeling angry due to lifestyle stressors (verbal altercations with family and boyfriend's friends). She drinks diet Dr. Reino Kent (1/2 liter per day). She also drinks coke zero, water, and crystal light. Patient is a former smoker (quit 12 years ago). She has been monitoring her BP daily specifically in the morning before BP meds (sometimes she takes BP meds 5 minutes before taking BP reading). She reports tolerating BP meds.    Family/Social History: mother (HTN, HLD); grandfather (stroke)  Insurance coverage/medication affordability: None (gets meds through [employer])  Patient reports adherence with medications overall. She did say before Christmas she forgot to take carvedilol and atorvastatin for 3-4 days.  Current hypertension medications include: Losartan 100 mg daily (QPM), Carvedilol 6.25 mg BID, spironolactone 25 mg daily  Current  diabetes medications include:metformin 500 mg BID, Invokana 300 mg daily Current hyperlipidemia medications include: atorvastatin 40 mg daily  Patient denies hypoglycemic events.  Patient-reported exercise habits: denies    Patient reports nocturia (nighttime urination) ~4x per night (baseline - she frequently wakes up at night to urinate)   O:  Physical Exam   ROS   Lab Results  Component Value Date   HGBA1C 6.9 (H) 05/17/2019   There were no vitals filed for this visit.  Lipid Panel     Component Value Date/Time   CHOL 188 08/21/2018 1128   TRIG 223 (H) 08/21/2018 1128   HDL 50 08/21/2018 1128   CHOLHDL 3.8 08/21/2018 1128   CHOLHDL 3.7 11/12/2015 1554   VLDL 71 (H) 11/12/2015 1554   LDLCALC 93 08/21/2018 1128    Home fasting blood sugars: 148, 271, 185, 229, 205, 270, 176 BP readings: 159/93, 191/138, 171/81, 180/99, 170/102, 192/109, 156/93, 190/131, 178/77   Clinical Atherosclerotic Cardiovascular Disease (ASCVD): {YES/NO:21197} The 10-year ASCVD risk score Denman George DC Jr., et al., 2013) is: 14.5%   Values used to calculate the score:     Age: 59 years     Sex: Female     Is Non-Hispanic African American: No     Diabetic: Yes     Tobacco smoker: No     Systolic Blood Pressure: 197 mmHg     Is BP treated: Yes     HDL Cholesterol: 50 mg/dL     Total Cholesterol: 188 mg/dL    A/P: Diabetes longstanding*** currently ***. Patient is not*** able to verbalize appropriate hypoglycemia management plan. Patient {Is/is not:9024} adherent with medication. Control is  suboptimal due to ***. -{Meds adjust:18428} basal insulin *** (insulin ***). Patient will continue to titrate 1 unit every ***days if fasting blood sugar > 100mg /dl until fasting blood sugars reach goal or next visit.  -{Meds adjust:18428}  rapid insulin *** (insulin ***) to ***.  -{Meds adjust:18428} GLP-1 *** (generic name***) to ***.  -{Meds adjust:18428} SGLT2-I *** (generic name***) to ***. Counseled  on sick day rules for ***. -Extensively discussed pathophysiology of diabetes, recommended lifestyle interventions, dietary effects on blood sugar control -Counseled on s/sx of and management of hypoglycemia -Next A1C anticipated ***.   ASCVD risk - primary***secondary prevention in patient with diabetes. Last LDL {Is/is not:9024} controlled. ASCVD risk score {Is/is not:9024} >20%  - {Desc; low/moderate/high:110033} intensity statin indicated. Aspirin {Is/is not:9024} indicated.  -{Meds adjust:18428} aspirin *** mg  -{Meds adjust:18428} ***statin *** mg.   Hypertension longstanding*** currently ***.  Blood pressure goal = *** mmHg. Patient medication adherence ***.  Blood pressure control is suboptimal due to ***. -***  Written patient instructions provided.  Total time in face to face counseling *** minutes.   Follow up Pharmacist/PCP*** Clinic Visit in ***.   Patient seen with ***

## 2019-06-21 NOTE — Progress Notes (Signed)
S:     Chief Complaint  Patient presents with  . Medication Management    HTN/DM    Patient arrives ambulating without assistance. She reports she has been feeling more angry lately. She is concerned because she feels she has been losing weight too quickly recently. Presents for diabetes evaluation, education, and management. Patient was last seen and evaluated by Primary Care Provider, Dr. Owens Shark, on 05/30/2019.  Patient denies NSAID use. Takes pseudoephedrine "once in a blue moon" and has not taken it in a while. She adds "a dash of salt' to food. She denies eating microwave meals/frozen foods and eating canned vegetables. She eats fast food daily - specifically [employer] breakfast. Food she enjoys to eat for breakfast at [employer] egg mcmuffin, hashbrown, sausage mcgriddle, and biscuits and gravy with egg on the side.  She eats at [employer] a lot for breakfast - specifically spinach and mushroom omelette with wheat toast (specifies that she eats there 2-3x per week for breakfast and once per week for dinner). She reports increase in stress and feeling angry due to lifestyle stressors (verbal altercations with family and boyfriend's friends). She drinks diet Dr. Malachi Bonds (1/2 liter per day). She also drinks coke zero, water, and crystal light. Patient is a former smoker (quit 12 years ago). She has been monitoring her BP daily specifically in the morning before BP meds (sometimes she takes BP meds 5 minutes before taking BP reading). She reports tolerating BP meds.    Family/Social History: mother (HTN, HLD); grandfather (stroke)  Insurance coverage/medication affordability: None (gets meds through [employer])  Patient reports adherence with medications overall. She did say before Christmas she forgot to take carvedilol and atorvastatin for 3-4 days.  Current hypertension medications include: Losartan 100 mg daily (QPM), Carvedilol 6.25 mg BID, spironolactone 25 mg daily  Current  diabetes medications include:metformin 500 mg BID, Invokana 300 mg daily Current hyperlipidemia medications include: atorvastatin 40 mg daily  Patient denies hypoglycemic events.  Patient-reported exercise habits: denies    Patient reports nocturia (nighttime urination) ~4x per night (baseline - she frequently wakes up at night to urinate)  O:  Physical Exam Constitutional:      Appearance: Normal appearance.  Cardiovascular:     Rate and Rhythm: Normal rate.  Psychiatric:        Mood and Affect: Mood normal.        Behavior: Behavior normal.        Thought Content: Thought content normal.        Judgment: Judgment normal.      Review of Systems  Genitourinary: Positive for frequency.     Lab Results  Component Value Date   HGBA1C 6.9 (H) 05/17/2019   There were no vitals filed for this visit.  Lipid Panel     Component Value Date/Time   CHOL 188 08/21/2018 1128   TRIG 223 (H) 08/21/2018 1128   HDL 50 08/21/2018 1128   CHOLHDL 3.8 08/21/2018 1128   CHOLHDL 3.7 11/12/2015 1554   VLDL 71 (H) 11/12/2015 1554   LDLCALC 93 08/21/2018 1128    Home fasting blood sugars: 148, 271, 185, 229, 205, 270, 176 BP readings: 159/93, 191/138, 171/81, 180/99, 170/102, 192/109, 156/93, 190/131, 178/77  Clinical Atherosclerotic Cardiovascular Disease (ASCVD): No  The 10-year ASCVD risk score Mikey Bussing DC Jr., et al., 2013) is: 14.5%   Values used to calculate the score:     Age: 77 years     Sex: Female  Is Non-Hispanic African American: No     Diabetic: Yes     Tobacco smoker: No     Systolic Blood Pressure: 197 mmHg     Is BP treated: Yes     HDL Cholesterol: 50 mg/dL     Total Cholesterol: 188 mg/dL    A/P: Hypertension longstanding currently uncontrolled.  Blood pressure goal = <130/80 mmHg. Patient medication adherence is decent.  Blood pressure control is suboptimal due to excessive Na in diet (frequent;y eats [employer] breakfast and diner), caffeine, and  stress. -Continue losartan 100 mg daily, carvedilol 3.125 mg twice daily, and spironolactone 25 mg daily -BMET obtained today. Will follow up with patient on results of BMET. If K or SCr elevated then will increase carvedilol dose to 6.25 mg BID. If K or SCr WNL then will increase spironolactone to 50 mg daily.  Diabetes longstanding controlled based on most recent HbA1c reading, however, fasting BG readings appear to have elevated since last appt. Patient reports adherent with medication. Control is suboptimal due to diet and stress. -Focus on eating healthier diet and exercising  -Continue metformin 1000 mg BID  -Continued SGLT2-I Invokana (generic name canagliflozin) 300 mg daily. -Extensively discussed pathophysiology of diabetes, recommended lifestyle interventions, dietary effects on blood sugar control -Counseled on s/sx of and management of hypoglycemia -Next A1C anticipated 05/2020  ASCVD risk - primary secondary prevention in patient with diabetes. Last LDL is controlled. ASCVD risk score is not >20%  - moderate intensity statin indicated at minimum. Aspirin could be considered, however, will not initiate due to how patient is receiving ASCVD benefit from statin and patient would prefer to keep medication regimen as simple as possible. -Continue atorvastatin 40 mg daily     Written patient instructions provided.  Total time in face to face counseling 60 minutes.   Follow up Pharmacist in 1 month and PCP this upcoming week.   Patient seen with Zachery Conch, PharmD, PGY2 [facility] pharmacy resident and Dr. Raymondo Band

## 2019-06-21 NOTE — Progress Notes (Signed)
Reviewed: I agree with the documentation and management of Dr. Koval. 

## 2019-06-21 NOTE — Assessment & Plan Note (Signed)
Hypertension longstanding currently uncontrolled.  Blood pressure goal = <130/80 mmHg. Patient medication adherence is decent.  Blood pressure control is suboptimal due to excessive Na in diet (frequent;y eats [employer] breakfast and diner), caffeine, and stress. -Continue losartan 100 mg daily, carvedilol 3.125 mg twice daily, and spironolactone 25 mg daily -BMET obtained today. Will follow up with patient on results of BMET. If K or SCr elevated then will increase carvedilol dose to 6.25 mg BID. If K or SCr WNL then will increase spironolactone to 50 mg daily.

## 2019-06-22 ENCOUNTER — Telehealth: Payer: Self-pay | Admitting: Pharmacist

## 2019-06-22 LAB — BASIC METABOLIC PANEL
BUN/Creatinine Ratio: 23 (ref 9–23)
BUN: 14 mg/dL (ref 6–24)
CO2: 20 mmol/L (ref 20–29)
Calcium: 9 mg/dL (ref 8.7–10.2)
Chloride: 103 mmol/L (ref 96–106)
Creatinine, Ser: 0.62 mg/dL (ref 0.57–1.00)
GFR calc Af Amer: 116 mL/min/{1.73_m2} (ref >59)
GFR calc non Af Amer: 100 mL/min/{1.73_m2} (ref >59)
Glucose: 251 mg/dL — ABNORMAL HIGH (ref 65–99)
Potassium: 4.3 mmol/L (ref 3.5–5.2)
Sodium: 146 mmol/L — ABNORMAL HIGH (ref 134–144)

## 2019-06-22 NOTE — Telephone Encounter (Signed)
Left message that lab work was normal (WNL).   No change in therapy suggested.

## 2019-06-29 ENCOUNTER — Other Ambulatory Visit: Payer: Self-pay

## 2019-06-29 ENCOUNTER — Encounter: Payer: Self-pay | Admitting: Family Medicine

## 2019-06-29 ENCOUNTER — Telehealth (INDEPENDENT_AMBULATORY_CARE_PROVIDER_SITE_OTHER): Payer: Self-pay | Admitting: Family Medicine

## 2019-06-29 DIAGNOSIS — I10 Essential (primary) hypertension: Secondary | ICD-10-CM

## 2019-06-29 MED ORDER — CARVEDILOL 6.25 MG PO TABS: 12.5000 mg | ORAL_TABLET | Freq: Two times a day (BID) | ORAL | 3 refills | Status: DC

## 2019-06-29 MED ORDER — CARVEDILOL 12.5 MG PO TABS: 12.5000 mg | ORAL_TABLET | Freq: Two times a day (BID) | ORAL | 3 refills | Status: DC

## 2019-06-29 MED ORDER — SPIRONOLACTONE 25 MG PO TABS: 25.0000 mg | ORAL_TABLET | Freq: Every day | ORAL | 3 refills | Status: DC

## 2019-06-29 MED ORDER — LOSARTAN POTASSIUM 100 MG PO TABS: 100.0000 mg | ORAL_TABLET | Freq: Every day | ORAL | 3 refills | Status: DC

## 2019-06-29 NOTE — Progress Notes (Signed)
[facility] Telemedicine Visit  Patient consented to have virtual visit. Method of visit: Telephone  Encounter participants: Patient: Carmen [name] - located at home Provider: Westley Chandler - located at [facility] Others (if applicable): none  Chief Complaint: nerve pain  HPI:  Carmen [name] is a pleasant 59 year old woman with history of type 2 diabetes and hypertension presenting today for follow-up.  She reports overall she is doing okay.  She went back to work on Monday.  She reports her neuropathy is doing okay.  She has numbness and tingling at times in her feet.  Earlier this week she has some tingling in her left hand.  This is been ongoing and the same for several months.  No associated chest pain or dyspnea.  Gabapentin is helping slightly.  She is not taking any over-the-counter medications.  In terms of the patient's blood pressure she is currently taking 6.25 carvedilol twice a day and spironolactone 25 mg in addition to losartan 100.  She denies chest pain, dizziness, dyspnea.  Her blood pressure however the last week has continued to be elevated.  Particular diastolics are greater than 100.  Heart rate ranges from 70s to 85. ==  Went back to work on [employer] on Monday. 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 Discussed limiting salt intake.  Refill sent all medications.  Neuropathic type pain, continue to encourage dietary changes.  No improvement since last time.  Etiology most likely is diabetes. Continue gabapentin at this time.   Time spent during visit with patient: 21 minutes

## 2019-06-29 NOTE — Assessment & Plan Note (Addendum)
Increased carvedilol to 12.5 mg BID---patient to call back with BP and HR next Thursday. Discussed limiting salt intake.

## 2019-07-02 ENCOUNTER — Other Ambulatory Visit: Payer: Self-pay

## 2019-07-02 ENCOUNTER — Telehealth (INDEPENDENT_AMBULATORY_CARE_PROVIDER_SITE_OTHER): Payer: Self-pay | Admitting: Family Medicine

## 2019-07-02 DIAGNOSIS — R6889 Other general symptoms and signs: Secondary | ICD-10-CM

## 2019-07-02 NOTE — Progress Notes (Signed)
[facility] Telemedicine Visit  Patient consented to have virtual visit. Method of visit: Telephone  Encounter participants: Patient: Carmen [name] - located at home Provider: Marthenia Rolling - located at telemed from home Others (if applicable):   Chief Complaint: cough/muscle aches/congestion/subjective fever  HPI: Nasal congestion and subjective fevers w/ sweats for ~3 days now but she says this is on/off as she premenopausal.   Has been using nasal decongestant.  Home BP is 151/98, has been taking meds as prescribed.  CBGs in mid 100s.  IS breathing fine, does not think need the hospital.  Has the ability to isolate at home.  Does not want to goo out for covid test yet, will think about it but is willing to isolate as though she has a known positive.  Muscle aches starting Friday is the symptom she is most willing to call "new" and not a recurring thing.  ROS: per HPI  Pertinent PMHx: DM, HTN  Exam:  On phone, but speaking in full sentences with no cough.  Appropriately processing information, does sound nasally congested  Assessment/Plan:  No problem-specific Assessment & Plan notes found for this encounter.   Nasal congestion with muscle aches and subjective fevers, no respiratory distress and BP slightly HTN, cbgs mid 100s.  Patient advised to isolate and get covid testing, declines testing scheduling assistance and will go to urgent care if she decides later.  Will isolate as though positive.  Discussed emergency precautions and patient understands.  Time spent during visit with patient: 15 minutes

## 2019-07-03 ENCOUNTER — Encounter (HOSPITAL_COMMUNITY): Payer: Self-pay

## 2019-07-03 ENCOUNTER — Ambulatory Visit (HOSPITAL_COMMUNITY)
Admission: EM | Admit: 2019-07-03 | Discharge: 2019-07-03 | Disposition: A | Discharge status: Home / Self Care | Payer: HRSA Program | Attending: Urgent Care | Admitting: Urgent Care

## 2019-07-03 ENCOUNTER — Other Ambulatory Visit: Payer: Self-pay

## 2019-07-03 DIAGNOSIS — J069 Acute upper respiratory infection, unspecified: Secondary | ICD-10-CM | POA: Diagnosis present

## 2019-07-03 DIAGNOSIS — Z20822 Contact with and (suspected) exposure to covid-19: Secondary | ICD-10-CM | POA: Diagnosis present

## 2019-07-03 NOTE — Discharge Instructions (Signed)
Continue the Over the counter nasal saline and nasacort  Take tylenol 325mg  tablet x 2 up to every 6 hours for sore throat, fever and body aches. Do not exceed 8 tablets in 24 hours   If your Covid-19 test is positive, you will receive a phone call from [facility] regarding your results. Negative test results are not called. Both positive and negative results area always visible on MyChart. If you do not have a MyChart account, sign up instructions are in your discharge papers.   Persons who are directed to care for themselves at home may discontinue isolation under the following conditions:   At least 10 days have passed since symptom onset and  At least 24 hours have passed without running a fever (this means without the use of fever-reducing medications) and  Other symptoms have improved.  Persons infected with COVID-19 who never develop symptoms may discontinue isolation and other precautions 10 days after the date of their first positive COVID-19 test.

## 2019-07-03 NOTE — ED Provider Notes (Signed)
[facility]    CSN: [id number] Arrival date & time: 07/03/19  0623      History   Chief Complaint Chief Complaint  Patient presents with  . COVID test  . Nasal Congestion    HPI Carmen [name] is a 59 y.o. female.   Patient reports to urgent care today for Congestion and fatigue since 4 days ago. She reports some ear pain in both ears. She denies fever, cough or headache. No nausea, vomiting or diarrhea. But she does endorse general fatigue that kept her in bed much of the weekend. She was to have a primary care appointment last week but canceled due to symptoms. She would like covid testing. She has not taken any medications for the symtpoms.      Past Medical History:  Diagnosis Date  . Adhesive capsulitis of shoulder   . Allergy   . Borderline personality disorder (HCC)   . Carpal tunnel syndrome   . COPD (chronic obstructive pulmonary disease) (HCC)   . Depression   . GERD (gastroesophageal reflux disease)   . Hypertension   . Type 2 diabetes mellitus [facility])     Patient Active Problem List   Diagnosis Date Noted  . Neuropathic pain 03/16/2019  . Chronic idiopathic constipation 01/05/2017  . Orthostatic hypotension 12/20/2016  . Anxiety 07/20/2016  . Hemorrhoids 11/27/2015  . Diabetes mellitus without complication (HCC) 11/15/2012  . Metabolic syndrome 08/19/2011  . Carpal tunnel syndrome 06/10/2010  . Allergic rhinitis 11/18/2008  . Osteoarthritis of multiple joints 07/30/2008  . Adhesive capsulitis of shoulder 01/15/2008  . Obesity 07/25/2007  . GERD 09/20/2006  . Major depressive disorder, recurrent episode (HCC) 08/04/2006  . BORDERLINE PERSONALITY 08/04/2006  . HYPERTENSION, BENIGN SYSTEMIC 08/04/2006  . Irritable bowel syndrome 08/04/2006    Past Surgical History:  Procedure Laterality Date  . CHOLECYSTECTOMY    . TONSILLECTOMY      OB History   No obstetric history on file.      Home Medications    Prior to Admission  medications   Medication Sig Start Date End Date Taking? Authorizing Provider  alum & mag hydroxide-simeth (MAALOX PLUS) 400-400-40 MG/5ML suspension Take 10 mLs by mouth every 6 (six) hours as needed for indigestion. 10/19/18   Dollene Cleveland, DO  atorvastatin (LIPITOR) 40 MG tablet Take 1 tablet (40 mg total) by mouth daily. 05/17/19   Moses Manners, MD  canagliflozin (INVOKANA) 300 MG TABS tablet Take 1 tablet (300 mg total) by mouth daily. 04/26/19   Moses Manners, MD  carvedilol (COREG) 12.5 MG tablet Take 1 tablet (12.5 mg total) by mouth 2 (two) times daily with a meal. 06/29/19   Westley Chandler, MD  cetirizine (ZYRTEC) 10 MG tablet Take 10 mg by mouth daily.    [provider]  esomeprazole (NEXIUM) 20 MG packet Take 20 mg by mouth daily before breakfast. Patient not taking: Reported on 06/21/2019 10/20/18   Georgetta Haber, NP  fluconazole (DIFLUCAN) 150 MG tablet Take 1 tablet (150 mg total) by mouth daily. 05/17/19   Bast, Gloris Manchester A, NP  fluticasone (FLONASE) 50 MCG/ACT nasal spray Place 2 sprays into both nostrils daily. Patient not taking: Reported on 05/17/2019 10/19/18   Peggyann Shoals C, DO  gabapentin (NEURONTIN) 300 MG capsule Take 1 capsule (300 mg total) by mouth 3 (three) times daily. Take 1 tablet in the AM and at lunch then 2 tablets at bedtime 06/15/19   Westley Chandler, MD  hydrOXYzine (ATARAX/VISTARIL) 25 MG tablet Take 1 tablet (25 mg total) by mouth daily as needed. Patient not taking: Reported on 06/21/2019 04/09/19   Westley Chandler, MD  ibuprofen (ADVIL) 600 MG tablet Take 1 tablet (600 mg total) by mouth every 8 (eight) hours as needed. 02/08/19   Garnette Gunner, MD  losartan (COZAAR) 100 MG tablet Take 1 tablet (100 mg total) by mouth at bedtime. 06/29/19   Westley Chandler, MD  metFORMIN (GLUCOPHAGE-XR) 500 MG 24 hr tablet TAKE TWO TABLETS BY MOUTH TWICE PER DAY 05/17/19   Moses Manners, MD  nystatin cream (MYCOSTATIN) Apply to affected area 2  times daily 05/17/19   Bast, Traci A, NP  psyllium (METAMUCIL) 58.6 % packet Take 1 packet by mouth daily.    [provider]  spironolactone (ALDACTONE) 25 MG tablet Take 1 tablet (25 mg total) by mouth daily. 06/29/19   Westley Chandler, MD  triamcinolone (NASACORT ALLERGY 24HR) 55 MCG/ACT AERO nasal inhaler Place 2 sprays into the nose daily. 05/17/19   Moses Manners, MD  clonazePAM (KLONOPIN) 0.5 MG tablet Take 1 tablet (0.5 mg total) by mouth 2 (two) times daily as needed for anxiety. 01/11/11 08/19/11  Reva Bores, MD  venlafaxine (EFFEXOR) 75 MG tablet Take 1 tablet (75 mg total) by mouth 2 (two) times daily. 03/15/11 08/19/11  Reva Bores, MD    Family History Family History  Problem Relation Age of Onset  . Diabetes Mother   . Hypertension Mother   . Alzheimer's disease Father   . Dementia Father     Social History Social History   Tobacco Use  . Smoking status: Former Smoker    Packs/day: 0.25    Years: 30.00    Pack years: 7.50    Types: Cigarettes    Quit date: 08/13/2007    Years since quitting: 11.8  . Smokeless tobacco: Never Used  . Tobacco comment: boyfriend smokes  Substance Use Topics  . Alcohol use: No    Comment: rarely  . Drug use: No     Allergies   Ace inhibitors and Metformin and related   Review of Systems Review of Systems  Constitutional: Positive for fatigue. Negative for chills and fever.  HENT: Positive for congestion and ear pain. Negative for ear discharge, rhinorrhea, sinus pressure, sinus pain and sore throat.   Eyes: Negative for pain and visual disturbance.  Respiratory: Negative for cough and shortness of breath.   Cardiovascular: Negative for chest pain and palpitations.  Gastrointestinal: Negative for abdominal pain, diarrhea, nausea and vomiting.  Genitourinary: Negative for dysuria and hematuria.  Musculoskeletal: Negative for arthralgias, back pain and myalgias.  Skin: Negative for color change and rash.   Neurological: Negative for seizures, syncope and headaches.  All other systems reviewed and are negative.    Physical Exam Triage Vital Signs ED Triage Vitals  Enc Vitals Group     BP --      Pulse Rate 07/03/19 1042 85     Resp 07/03/19 1042 18     Temp 07/03/19 1042 98.7 F (37.1 C)     Temp Source 07/03/19 1042 Oral     SpO2 07/03/19 1042 97 %     Weight --      Height --      Head Circumference --      Peak Flow --      Pain Score 07/03/19 1039 1     Pain Loc --  Pain Edu? --      Excl. in GC? --    No data found.  Updated Vital Signs BP 133/79 (BP Location: Left Arm)   Pulse 85   Temp 98.7 F (37.1 C) (Oral)   Resp 18   SpO2 97%   Visual Acuity Right Eye Distance:   Left Eye Distance:   Bilateral Distance:    Right Eye Near:   Left Eye Near:    Bilateral Near:     Physical Exam Vitals and nursing note reviewed.  Constitutional:      General: She is not in acute distress.    Appearance: Normal appearance. She is well-developed. She is not ill-appearing.  HENT:     Head: Normocephalic and atraumatic.     Right Ear: Tympanic membrane, ear canal and external ear normal.     Left Ear: Tympanic membrane, ear canal and external ear normal.     Nose: Congestion present. No rhinorrhea.     Mouth/Throat:     Mouth: Mucous membranes are moist.     Pharynx: Oropharynx is clear. No oropharyngeal exudate or posterior oropharyngeal erythema.  Eyes:     General: No scleral icterus.    Extraocular Movements: Extraocular movements intact.     Conjunctiva/sclera: Conjunctivae normal.     Pupils: Pupils are equal, round, and reactive to light.  Cardiovascular:     Rate and Rhythm: Normal rate and regular rhythm.     Heart sounds: No murmur.  Pulmonary:     Effort: Pulmonary effort is normal. No respiratory distress.     Breath sounds: Normal breath sounds. No wheezing, rhonchi or rales.  Abdominal:     Palpations: Abdomen is soft.     Tenderness: There is  no abdominal tenderness. There is no right CVA tenderness or left CVA tenderness.  Musculoskeletal:     Cervical back: Neck supple.     Right lower leg: No edema.     Left lower leg: No edema.  Lymphadenopathy:     Cervical: No cervical adenopathy.  Skin:    General: Skin is warm and dry.     Capillary Refill: Capillary refill takes less than 2 seconds.  Neurological:     General: No focal deficit present.     Mental Status: She is alert.  Psychiatric:        Mood and Affect: Mood normal.        Behavior: Behavior normal.        Thought Content: Thought content normal.        Judgment: Judgment normal.      UC Treatments / Results  Labs (all labs ordered are listed, but only abnormal results are displayed) Labs Reviewed  NOVEL CORONAVIRUS, NAA (HOSP ORDER, SEND-OUT TO REF LAB; TAT 18-24 HRS)    EKG   Radiology No results found.  Procedures Procedures (including critical care time)  Medications Ordered in UC Medications - No data to display  Initial Impression / Assessment and Plan / UC Course  I have reviewed the triage vital signs and the nursing notes.  Pertinent labs & imaging results that were available during my care of the patient were reviewed by me and considered in my medical decision making (see chart for details).    #viral URI - viral syndrome. COVID PCR sent. Discussed nasal saline spray and continued nasacort. Tylenol for pain or fever. Return and ED precaution discussed.    Final Clinical Impressions(s) / UC Diagnoses   Final diagnoses:  Viral upper respiratory tract infection  Encounter for laboratory testing for COVID-19 virus     Discharge Instructions     Continue the Over the counter nasal saline and nasacort  Take tylenol 325mg  tablet x 2 up to every 6 hours for sore throat, fever and body aches. Do not exceed 8 tablets in 24 hours   If your Covid-19 test is positive, you will receive a phone call from [facility] regarding your  results. Negative test results are not called. Both positive and negative results area always visible on MyChart. If you do not have a MyChart account, sign up instructions are in your discharge papers.   Persons who are directed to care for themselves at home may discontinue isolation under the following conditions:  . At least 10 days have passed since symptom onset and . At least 24 hours have passed without running a fever (this means without the use of fever-reducing medications) and . Other symptoms have improved.  Persons infected with COVID-19 who never develop symptoms may discontinue isolation and other precautions 10 days after the date of their first positive COVID-19 test.     ED Prescriptions    None     PDMP not reviewed this encounter.   Purnell Shoemaker, PA-C 07/03/19 2037

## 2019-07-03 NOTE — ED Triage Notes (Signed)
Pt presents to UC with nasal congestion and bilateral ear pain x 1 week. Pt need a COVID test to go to her PCP.

## 2019-07-05 ENCOUNTER — Telehealth: Payer: Self-pay | Admitting: *Deleted

## 2019-07-05 LAB — NOVEL CORONAVIRUS, NAA (HOSP ORDER, SEND-OUT TO REF LAB; TAT 18-24 HRS): SARS-CoV-2, NAA: NOT DETECTED

## 2019-07-05 NOTE — Telephone Encounter (Signed)
Carmen [name] reports 5 days of symptoms--upper airway congestion.  She denies fevers or purulent discharge.  She reports overall she is doing okay.  She had a negative Covid test earlier this week.  No chest pain or shortness of breath.  She is using Flonase and Claritin.  She does not have humidifier.  She has number of stressors in her life right now including some fraudulent activity on her bank account.  She denies excessive anxiety or depression.  Recommended she call back on Monday morning as to how  her symptoms are doing.  We will begin recommended symptomatic treatment with honey pushing fluids and humidifier.  All questions answered reviewed reasons to call or return to care

## 2019-07-05 NOTE — Telephone Encounter (Signed)
Pt is requesting that Dr. Manson Passey call her @ [phone].  Did not want to go into more detail. Jone Baseman, CMA

## 2019-07-13 ENCOUNTER — Telehealth: Payer: Self-pay | Admitting: Pharmacist

## 2019-07-13 DIAGNOSIS — I10 Essential (primary) hypertension: Secondary | ICD-10-CM

## 2019-07-13 MED ORDER — SPIRONOLACTONE 25 MG PO TABS: 25.0000 mg | ORAL_TABLET | Freq: Every day | ORAL | 3 refills | Status: DC

## 2019-07-13 MED ORDER — CARVEDILOL 25 MG PO TABS: 25.0000 mg | ORAL_TABLET | Freq: Two times a day (BID) | ORAL | 3 refills | Status: DC

## 2019-07-13 NOTE — Telephone Encounter (Signed)
Noted and agree. 

## 2019-07-13 NOTE — Telephone Encounter (Signed)
Patient called with request to update and refill two prescription with [facility].  I informed her that she should contact them when she needs refills.   She reported a dose increase of Carvedilol by Dr. Manson Passey at last visit.  She reports taking two 12.5mg  carvedilol twice daily.  She is aware the dose increase on the refill will allow her to take just one pill twice daily.   She also requested a refill of spironolactone 25mg  once daily.   Both refills provided - sent to [facility].  She verbalized understanding with treatment plan and will follow-up with Dr. next.

## 2019-07-19 ENCOUNTER — Telehealth: Payer: Self-pay | Admitting: Pharmacist

## 2019-07-19 NOTE — Telephone Encounter (Signed)
Called patient on 07/19/2019 at 3:54 PM   Patient states she has not checked her BG or BP readings since last appt due to multiple life stressors. Asked patient to check BG fasting once daily in the AM and to check BP 2 hours after taking BP meds - patient is agreeable.   Follow up in 3 weeks.  Thank you for involving pharmacy to assist in providing this patient's care.   Zachery Conch, PharmD PGY2 Ambulatory Care Pharmacy Resident

## 2019-07-29 ENCOUNTER — Ambulatory Visit (HOSPITAL_COMMUNITY)
Admission: EM | Admit: 2019-07-29 | Discharge: 2019-07-29 | Disposition: A | Discharge status: Home / Self Care | Payer: Self-pay | Attending: Family Medicine | Admitting: Family Medicine

## 2019-07-29 ENCOUNTER — Encounter (HOSPITAL_COMMUNITY): Payer: Self-pay

## 2019-07-29 ENCOUNTER — Other Ambulatory Visit: Payer: Self-pay

## 2019-07-29 DIAGNOSIS — R739 Hyperglycemia, unspecified: Secondary | ICD-10-CM

## 2019-07-29 DIAGNOSIS — I10 Essential (primary) hypertension: Secondary | ICD-10-CM

## 2019-07-29 LAB — GLUCOSE, CAPILLARY: Glucose-Capillary: 165 mg/dL — ABNORMAL HIGH (ref 70–99)

## 2019-07-29 LAB — CBG MONITORING, ED: Glucose-Capillary: 165 mg/dL — ABNORMAL HIGH (ref 70–99)

## 2019-07-29 NOTE — ED Triage Notes (Signed)
Pt presents with elevated blood pressure and high blood sugar toady; pt states she has not been very compliant with following diet and taking medication as prescribed.

## 2019-07-29 NOTE — Discharge Instructions (Signed)
Follow-up with your doctor this week about your symptoms. Work note given

## 2019-07-31 NOTE — ED Provider Notes (Signed)
[place]    CSN: [id number] Arrival date & time: 07/29/19  1409      History   Chief Complaint Chief Complaint  Patient presents with  . Hypertension  . High Blood Sugar    HPI Carmen [name] is a 59 y.o. female.   Patient is a 59 year old female with past medical history of allergy, borderline personality disorder, carpal tunnel, COPD, depression, GERD, hypertension, type 2 diabetes.  She presents today for elevated blood pressure readings and elevated blood sugar.  This is an ongoing issue for her.  She has history of noncompliance.  Reporting her diet has not been under control.  She has been checking her blood sugars at home ranging in the 200s.  Requesting work note due to just not feeling well.  Having overall fatigue.  Recent change in blood pressure medication.  Denies any headache, blurred vision, dizziness, chest pain or shortness of breath.  ROS per HPI      Past Medical History:  Diagnosis Date  . Adhesive capsulitis of shoulder   . Allergy   . Borderline personality disorder ([place])   . Carpal tunnel syndrome   . COPD (chronic obstructive pulmonary disease) ([place])   . Depression   . GERD (gastroesophageal reflux disease)   . Hypertension   . Type 2 diabetes mellitus [facility])     Patient Active Problem List   Diagnosis Date Noted  . Neuropathic pain 03/16/2019  . Chronic idiopathic constipation 01/05/2017  . Orthostatic hypotension 12/20/2016  . Anxiety 07/20/2016  . Hemorrhoids 11/27/2015  . Diabetes mellitus without complication ([place]) 09/32/3557  . Metabolic syndrome 32/20/2542  . Carpal tunnel syndrome 06/10/2010  . Allergic rhinitis 11/18/2008  . Osteoarthritis of multiple joints 07/30/2008  . Adhesive capsulitis of shoulder 01/15/2008  . Obesity 07/25/2007  . GERD 09/20/2006  . Major depressive disorder, recurrent episode ([place]) 08/04/2006  . BORDERLINE PERSONALITY 08/04/2006  . HYPERTENSION, BENIGN SYSTEMIC 08/04/2006  .  Irritable bowel syndrome 08/04/2006    Past Surgical History:  Procedure Laterality Date  . CHOLECYSTECTOMY    . TONSILLECTOMY      OB History   No obstetric history on file.      Home Medications    Prior to Admission medications   Medication Sig Start Date End Date Taking? Authorizing Provider  alum & mag hydroxide-simeth (MAALOX PLUS) 400-400-40 MG/5ML suspension Take 10 mLs by mouth every 6 (six) hours as needed for indigestion. 10/19/18   Daisy Floro, DO  atorvastatin (LIPITOR) 40 MG tablet Take 1 tablet (40 mg total) by mouth daily. 05/17/19   Zenia Resides, MD  canagliflozin (INVOKANA) 300 MG TABS tablet Take 1 tablet (300 mg total) by mouth daily. 04/26/19   Zenia Resides, MD  carvedilol (COREG) 25 MG tablet Take 1 tablet (25 mg total) by mouth 2 (two) times daily with a meal. 07/13/19   Hensel, Jamal Collin, MD  cetirizine (ZYRTEC) 10 MG tablet Take 10 mg by mouth daily.    [provider]  esomeprazole (NEXIUM) 20 MG packet Take 20 mg by mouth daily before breakfast. Patient not taking: Reported on 06/21/2019 10/20/18   Zigmund Gottron, NP  fluconazole (DIFLUCAN) 150 MG tablet Take 1 tablet (150 mg total) by mouth daily. 05/17/19   Zaccai Chavarin, Tressia Miners A, NP  fluticasone (FLONASE) 50 MCG/ACT nasal spray Place 2 sprays into both nostrils daily. Patient not taking: Reported on 05/17/2019 10/19/18   Milus Banister C, DO  gabapentin (NEURONTIN) 300 MG capsule  Take 1 capsule (300 mg total) by mouth 3 (three) times daily. Take 1 tablet in the AM and at lunch then 2 tablets at bedtime 06/15/19   Westley Chandler, MD  hydrOXYzine (ATARAX/VISTARIL) 25 MG tablet Take 1 tablet (25 mg total) by mouth daily as needed. Patient not taking: Reported on 06/21/2019 04/09/19   Westley Chandler, MD  ibuprofen (ADVIL) 600 MG tablet Take 1 tablet (600 mg total) by mouth every 8 (eight) hours as needed. 02/08/19   Garnette Gunner, MD  losartan (COZAAR) 100 MG tablet Take 1 tablet (100 mg  total) by mouth at bedtime. 06/29/19   Westley Chandler, MD  metFORMIN (GLUCOPHAGE-XR) 500 MG 24 hr tablet TAKE TWO TABLETS BY MOUTH TWICE PER DAY 05/17/19   Moses Manners, MD  nystatin cream (MYCOSTATIN) Apply to affected area 2 times daily 05/17/19   Jacia Sickman A, NP  psyllium (METAMUCIL) 58.6 % packet Take 1 packet by mouth daily.    [provider]  spironolactone (ALDACTONE) 25 MG tablet Take 1 tablet (25 mg total) by mouth daily. 07/13/19   Moses Manners, MD  triamcinolone (NASACORT ALLERGY 24HR) 55 MCG/ACT AERO nasal inhaler Place 2 sprays into the nose daily. 05/17/19   Moses Manners, MD  clonazePAM (KLONOPIN) 0.5 MG tablet Take 1 tablet (0.5 mg total) by mouth 2 (two) times daily as needed for anxiety. 01/11/11 08/19/11  Reva Bores, MD  venlafaxine (EFFEXOR) 75 MG tablet Take 1 tablet (75 mg total) by mouth 2 (two) times daily. 03/15/11 08/19/11  Reva Bores, MD    Family History Family History  Problem Relation Age of Onset  . Diabetes Mother   . Hypertension Mother   . Alzheimer's disease Father   . Dementia Father     Social History Social History   Tobacco Use  . Smoking status: Former Smoker    Packs/day: 0.25    Years: 30.00    Pack years: 7.50    Types: Cigarettes    Quit date: 08/13/2007    Years since quitting: 11.9  . Smokeless tobacco: Never Used  . Tobacco comment: boyfriend smokes  Substance Use Topics  . Alcohol use: No    Comment: rarely  . Drug use: No     Allergies   Ace inhibitors and Metformin and related   Review of Systems Review of Systems   Physical Exam Triage Vital Signs ED Triage Vitals  Enc Vitals Group     BP 07/29/19 1423 (!) 170/97     Pulse Rate 07/29/19 1423 91     Resp 07/29/19 1423 16     Temp 07/29/19 1423 98.1 F (36.7 C)     Temp Source 07/29/19 1423 Oral     SpO2 07/29/19 1423 97 %     Weight --      Height --      Head Circumference --      Peak Flow --      Pain Score 07/29/19 1427 0      Pain Loc --      Pain Edu? --      Excl. in GC? --    No data found.  Updated Vital Signs BP (!) 170/97 (BP Location: Left Arm)   Pulse 91   Temp 98.1 F (36.7 C) (Oral)   Resp 16   SpO2 97%   Visual Acuity Right Eye Distance:   Left Eye Distance:   Bilateral Distance:    Right  Eye Near:   Left Eye Near:    Bilateral Near:     Physical Exam Vitals and nursing note reviewed.  Constitutional:      General: She is not in acute distress.    Appearance: Normal appearance. She is not ill-appearing, toxic-appearing or diaphoretic.  HENT:     Head: Normocephalic.     Nose: Nose normal.  Eyes:     Conjunctiva/sclera: Conjunctivae normal.  Pulmonary:     Effort: Pulmonary effort is normal.  Musculoskeletal:        General: Normal range of motion.     Cervical back: Normal range of motion.  Skin:    General: Skin is warm and dry.     Findings: No rash.  Neurological:     Mental Status: She is alert.  Psychiatric:        Mood and Affect: Mood normal.      UC Treatments / Results  Labs (all labs ordered are listed, but only abnormal results are displayed) Labs Reviewed  GLUCOSE, CAPILLARY - Abnormal; Notable for the following components:      Result Value   Glucose-Capillary 165 (*)    All other components within normal limits  CBG MONITORING, ED - Abnormal; Notable for the following components:   Glucose-Capillary 165 (*)    All other components within normal limits    EKG   Radiology No results found.  Procedures Procedures (including critical care time)  Medications Ordered in UC Medications - No data to display  Initial Impression / Assessment and Plan / UC Course  I have reviewed the triage vital signs and the nursing notes.  Pertinent labs & imaging results that were available during my care of the patient were reviewed by me and considered in my medical decision making (see chart for details).     Essential hypertension-continue blood  pressure medication as prescribed  Hyperglycemia-continue Metformin as prescribed.  Make sure you are watching your diet Follow-up with your primary care doctor Final Clinical Impressions(s) / UC Diagnoses   Final diagnoses:  Essential hypertension  Hyperglycemia     Discharge Instructions     Follow-up with your doctor this week about your symptoms. Work note given    ED Prescriptions    None     PDMP not reviewed this encounter.   Janace Aris, NP 07/31/19 [phone]

## 2019-08-01 ENCOUNTER — Telehealth: Payer: Self-pay | Admitting: Pharmacist

## 2019-08-01 DIAGNOSIS — E119 Type 2 diabetes mellitus without complications: Secondary | ICD-10-CM

## 2019-08-01 MED ORDER — GABAPENTIN 300 MG PO CAPS: 300.0000 mg | ORAL_CAPSULE | Freq: Three times a day (TID) | ORAL | 3 refills | Status: DC

## 2019-08-01 NOTE — Telephone Encounter (Signed)
Patient called requesting gabapentin refill - message left on Voice Mail.   Agreed to refill.  Provided new Rx for well controlled neuropathy.   Reviewed by Dr. Leveda Anna.  (PCP - Dr. Manson Passey on vacation).

## 2019-08-09 ENCOUNTER — Telehealth: Payer: Self-pay | Admitting: Pharmacist

## 2019-08-09 NOTE — Telephone Encounter (Signed)
Called patient on 08/09/2019 at 1:13 PM and left HIPAA-compliant VM with instructions to call Family Medicine clinic back   Plan to discuss DM and HTN management  Zachery Conch, PharmD PGY2 Ambulatory Care Pharmacy Resident

## 2019-08-23 NOTE — Telephone Encounter (Signed)
Patient calls nurse line returning [facility] phone call. You may try her back anytime today on this phone number. [phone]

## 2019-08-23 NOTE — Telephone Encounter (Signed)
Called patient on 08/23/2019 at 11:26 AM and left HIPAA-compliant VM with instructions to call [facility] clinic back   Unsuccessful attempt #2  Plan to discuss BP and DM management. Will re-attempt follow up again in 2 weeks.  Thank you for involving pharmacy to assist in providing this patient's care.   Zachery Conch, PharmD PGY2 Ambulatory Care Pharmacy Resident

## 2019-08-28 ENCOUNTER — Telehealth: Payer: Self-pay | Admitting: Pharmacist

## 2019-08-28 NOTE — Telephone Encounter (Signed)
Called patient. She is currently at work. She would like to be excused from jury duty due to pain, diabetes, and mental health. Discussed that in my medical opinion, jury duty would be safe for her to do given her medical conditions, will not write letter.  Terisa Starr, MD  Family Medicine Teaching Service

## 2019-08-28 NOTE — Telephone Encounter (Signed)
Patient called stating she has been summoned for jury duty in a couple weeks.    She states that Dr. Shawnie Pons had written her a note in the past to be excused from [employer].  She inquired if I could do that.   I shared with her that her new PCP, Dr. Manson Passey, would be the appropriate person to decide if this request was appropriate.  I agreed to share the request for a jury excuse note with Dr. Manson Passey.   She requested a call back on this issue.  States that she is OFF work today after 3:00PM and requested a call back at that time.

## 2019-08-30 ENCOUNTER — Ambulatory Visit: Payer: Self-pay | Admitting: Pharmacist

## 2019-09-05 ENCOUNTER — Telehealth: Payer: Self-pay | Admitting: Pharmacist

## 2019-09-05 NOTE — Telephone Encounter (Signed)
Called patient on 09/05/2019 at 12:50 PM   Plan to discusss DM management. Will attempt to contact patient again tomorrow (09/06/2019).  Thank you for involving pharmacy to assist in providing this patient's care.   Zachery Conch, PharmD PGY2 Ambulatory Care Pharmacy Resident

## 2019-09-06 ENCOUNTER — Telehealth: Payer: Self-pay | Admitting: Pharmacist

## 2019-09-06 NOTE — Telephone Encounter (Signed)
Called patient on 09/06/2019 at 11:43 AM   Patient is experiencing multiple life stressors related to her family.  Her BG readings have ranged between 138 mg/dL to 336 mg/dL. She states her BG readings are around 200 mg/dL after she eats heavy carb food (ex: she ate 1/2 lb of brownies/cookies/doritos for her breakfast). She reports adherence to metformin 1000 mg BID and Invokana 300 mg daily.  Patient reports having issues with wrist BP monitor previously, however, recently got the battery changed. Her BP has ranged between 100 to 168 systolic and is around 88 diastolic.She reports adherence to losartan 100, carevdilol 6.25 mg twice daily, and spironolactone 25 mg daily.   Advised patient to make PharmD appt for adjustment for BP medications. However, patient is busy with work and then will be going on a vacation to the beach. Patient states she will follow up and make an appt with pharmacy at [facility] after she returns from the beach. Will plan to continue current DM and HTN medication regimen for now.  Thank you for involving pharmacy to assist in providing this patient's care.   Zachery Conch, PharmD PGY2 Ambulatory Care Pharmacy Resident

## 2019-09-13 ENCOUNTER — Telehealth: Payer: Self-pay | Admitting: Pharmacist

## 2019-09-13 DIAGNOSIS — E119 Type 2 diabetes mellitus without complications: Secondary | ICD-10-CM

## 2019-09-13 MED ORDER — GABAPENTIN 300 MG PO CAPS: 300.0000 mg | ORAL_CAPSULE | Freq: Three times a day (TID) | ORAL | 3 refills | Status: DC

## 2019-09-13 NOTE — Telephone Encounter (Signed)
Noted and agree. 

## 2019-09-13 NOTE — Telephone Encounter (Signed)
Patient called requesting refill of Gabapentin which she has used in the past and states is currently working "fairly well" to control her neuropathy.  She reports blood glucose levels of 170 to low 200s.   After patient shared a stressful family situation for > 5 minutes, I expressed hope for less stress and improved blood glucose control in the upcoming weeks.  Patient is planning to reschedule a visit here at the [facility] in the next 4-6 weeks.   Refill resent to her pharmacy.

## 2019-09-24 ENCOUNTER — Telehealth: Payer: Self-pay

## 2019-09-24 DIAGNOSIS — B3731 Acute candidiasis of vulva and vagina: Secondary | ICD-10-CM

## 2019-09-24 DIAGNOSIS — B373 Candidiasis of vulva and vagina: Secondary | ICD-10-CM

## 2019-09-24 MED ORDER — FLUCONAZOLE 150 MG PO TABS: 150.0000 mg | ORAL_TABLET | Freq: Once | ORAL | 0 refills | Status: AC

## 2019-09-24 NOTE — Telephone Encounter (Signed)
Patient calls nurse line regarding increased vaginal itching and irritation. Patient reports that she is just returning from vacation and believes that she has a yeast infection. Patient requesting medication be sent in for yeast infection.  Advised patient that best practice would be for her to come in for office visit for evaluation. Patient prefers to await response from Dr. Manson Passey.   Please advise next steps  To PCP  Veronda Prude, RN

## 2019-09-24 NOTE — Telephone Encounter (Signed)
Called patient. She just returned from vacation. Has had vaginal itching and discharge. Not sexually active. Discussed that DM medications likely need monitoring/adjusting. Appointment scheduled. Rx for fluconazole X1.   Terisa Starr, MD  Family Medicine Teaching Service

## 2019-09-26 ENCOUNTER — Telehealth: Payer: Self-pay | Admitting: Pharmacist

## 2019-09-26 MED ORDER — CARVEDILOL 25 MG PO TABS: 25.0000 mg | ORAL_TABLET | Freq: Two times a day (BID) | ORAL | 3 refills | Status: DC

## 2019-09-26 NOTE — Telephone Encounter (Signed)
Patient called requesting refill of carvedilol  (leaving refill request on voice mail).  She has been taking two 12.5mg  tablets twice daily.  Her prescription had said 1 twice daily.   New prescription for carvedilol 25mg  1 tablet BID sent to [facility] as requested.   I communicated new prescription sent via phone voice mail.

## 2019-10-03 ENCOUNTER — Ambulatory Visit (INDEPENDENT_AMBULATORY_CARE_PROVIDER_SITE_OTHER): Payer: Self-pay | Admitting: Family Medicine

## 2019-10-03 ENCOUNTER — Other Ambulatory Visit: Payer: Self-pay

## 2019-10-03 VITALS — BP 158/90 | HR 90 | Wt 199.2 lb

## 2019-10-03 DIAGNOSIS — E119 Type 2 diabetes mellitus without complications: Secondary | ICD-10-CM

## 2019-10-03 DIAGNOSIS — M792 Neuralgia and neuritis, unspecified: Secondary | ICD-10-CM

## 2019-10-03 LAB — POCT GLYCOSYLATED HEMOGLOBIN (HGB A1C): HbA1c, POC (controlled diabetic range): 9 % — AB (ref 0.0–7.0)
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
## 2019-10-03 NOTE — Patient Instructions (Signed)
It was great to see you!  Our plans for today:  - Your pain is likely from your diabetic neuropathy. Continue to take the gabapentin and let us know if this becomes worse or changes.  - Make an appointment to discuss your hemorrhoids.  We are checking some labs today, we will call you or send you a letter with these results.   Take care and seek immediate care sooner if you develop any concerns.   Dr. [facility]

## 2019-10-04 LAB — BASIC METABOLIC PANEL
BUN/Creatinine Ratio: 20 (ref 9–23)
BUN: 11 mg/dL (ref 6–24)
CO2: 25 mmol/L (ref 20–29)
Calcium: 8.9 mg/dL (ref 8.7–10.2)
Chloride: 100 mmol/L (ref 96–106)
Creatinine, Ser: 0.55 mg/dL — ABNORMAL LOW (ref 0.57–1.00)
GFR calc Af Amer: 120 mL/min/{1.73_m2} (ref >59)
GFR calc non Af Amer: 104 mL/min/{1.73_m2} (ref >59)
Glucose: 283 mg/dL — ABNORMAL HIGH (ref 65–99)
Potassium: 3.8 mmol/L (ref 3.5–5.2)
Sodium: 141 mmol/L (ref 134–144)

## 2019-10-04 LAB — TSH: TSH: 3.76 u[IU]/mL (ref 0.450–4.500)

## 2019-10-04 NOTE — Assessment & Plan Note (Signed)
Symptoms most consistent with neuropathic pain without changes or worsening in symptoms.  Neuro exam within normal limits today without red flags on history or exam.  Likely secondary to ongoing uncontrolled diabetes, A1c 9.0 today.  Given her relief with gabapentin, recommend continuation.  Return precautions discussed for new or worsening symptoms or lack of relief with gabapentin.  Recommend follow-up with PCP soon for diabetic control.

## 2019-10-05 ENCOUNTER — Telehealth: Payer: Self-pay | Admitting: Pharmacist

## 2019-10-05 MED ORDER — CARVEDILOL 25 MG PO TABS: 25.0000 mg | ORAL_TABLET | Freq: Two times a day (BID) | ORAL | 3 refills | Status: DC

## 2019-10-05 MED ORDER — CARVEDILOL 25 MG PO TABS: 25.0000 mg | ORAL_TABLET | Freq: Two times a day (BID) | ORAL | 0 refills | Status: DC

## 2019-10-05 NOTE — Telephone Encounter (Signed)
Patient states she has been out of carvedilol for ~ 1 week.   I agreed to call in a 30 day supply Carvedilol 1 tab BID #60 No Refill.  Called to [place] at [employer].

## 2019-10-05 NOTE — Telephone Encounter (Signed)
Patient called and left voice mail 4/29 requesting additional support for med supply of carvedilol from [facility].   I returned call and left voice mail that I had again submitted a prescription to [facility] ([place] [facility]) and can also for her local pharmacy ([facility]) if she is in short supply if needed.   Asked her to call back with any questions.

## 2019-10-15 ENCOUNTER — Ambulatory Visit: Payer: Self-pay | Admitting: Family Medicine

## 2019-10-25 ENCOUNTER — Telehealth: Payer: Self-pay | Admitting: Pharmacist

## 2019-10-25 NOTE — Telephone Encounter (Signed)
Contacted patient to follow-up on blood pressure and blood sugar.  She reports that she cancelled her appointment with Dr. 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    She admitted that she has not be checking any blood sugar readings nor any blood pressure assessments.   She did report that she is taking all of her medications.  I encouarged continued adherence.   Current meds include:  BP meds - losartan 100, carevdilol 25 mg twice daily, and spironolactone 25 mg daily   DM meds - metformin 1000 mg BID and canagliflozin 300 mg daily   Clarified that she should be taking carvedillol 25mg  (one tablet) BID.   She was taking this correctly but wanted to be sure. She plans to reschedule with Dr. in June.

## 2019-11-12 ENCOUNTER — Telehealth: Payer: Self-pay | Admitting: Pharmacist

## 2019-11-12 DIAGNOSIS — E119 Type 2 diabetes mellitus without complications: Secondary | ICD-10-CM

## 2019-11-12 MED ORDER — GABAPENTIN 300 MG PO CAPS: 300.0000 mg | ORAL_CAPSULE | Freq: Three times a day (TID) | ORAL | 0 refills | Status: DC

## 2019-11-12 NOTE — Telephone Encounter (Signed)
Patient called requesting refill of gabapentin.  She states she is using less than prescribed (only taking at night).  She was encouraged to use as prescribed if pain is problematic.   Sent single fill to local pharmacy.  Requested patient follow-up within the next several weeks with her primary provider, Dr. Manson Passey. She agreed to schedule visit soon.   Blood glucose was reported to be fairly good with a single PM check a few days ago (137 reported).  However, she continues to have nocturia of Q2 hours.

## 2020-01-14 ENCOUNTER — Telehealth: Payer: Self-pay | Admitting: Pharmacist

## 2020-01-14 DIAGNOSIS — E119 Type 2 diabetes mellitus without complications: Secondary | ICD-10-CM

## 2020-01-14 MED ORDER — GABAPENTIN 300 MG PO CAPS: 300.0000 mg | ORAL_CAPSULE | Freq: Three times a day (TID) | ORAL | 1 refills | Status: DC

## 2020-01-14 NOTE — Telephone Encounter (Signed)
Noted and agree. 

## 2020-01-14 NOTE — Telephone Encounter (Signed)
Phone call from patient requesting refill of gabapentin.  She states she is trying to schedule with Dr. Manson Passey for an appointment in the upcoming weeks.  She states the Gabapentin is controller her symptoms.   New Rx provided with 1 refill.

## 2020-02-25 ENCOUNTER — Other Ambulatory Visit: Payer: Self-pay | Admitting: Family Medicine

## 2020-02-25 DIAGNOSIS — E119 Type 2 diabetes mellitus without complications: Secondary | ICD-10-CM

## 2020-03-07 ENCOUNTER — Ambulatory Visit (INDEPENDENT_AMBULATORY_CARE_PROVIDER_SITE_OTHER): Payer: Self-pay | Admitting: Family Medicine

## 2020-03-07 ENCOUNTER — Encounter: Payer: Self-pay | Admitting: Family Medicine

## 2020-03-07 ENCOUNTER — Other Ambulatory Visit: Payer: Self-pay

## 2020-03-07 VITALS — BP 145/88 | HR 83 | Wt 194.8 lb

## 2020-03-07 DIAGNOSIS — E119 Type 2 diabetes mellitus without complications: Secondary | ICD-10-CM

## 2020-03-07 DIAGNOSIS — E1149 Type 2 diabetes mellitus with other diabetic neurological complication: Secondary | ICD-10-CM

## 2020-03-07 DIAGNOSIS — I10 Essential (primary) hypertension: Secondary | ICD-10-CM

## 2020-03-07 LAB — POCT GLYCOSYLATED HEMOGLOBIN (HGB A1C): HbA1c, POC (controlled diabetic range): 7.7 % — AB (ref 0.0–7.0)

## 2020-03-07 MED ORDER — GABAPENTIN 300 MG PO CAPS: 300.0000 mg | ORAL_CAPSULE | Freq: Every day | ORAL | 3 refills | Status: DC

## 2020-03-07 MED ORDER — SPIRONOLACTONE 25 MG PO TABS: 25.0000 mg | ORAL_TABLET | Freq: Every day | ORAL | 3 refills | Status: DC

## 2020-03-07 NOTE — Assessment & Plan Note (Signed)
Congratulated on A1C--suspect this and weight loss due to Invokana addition. Discussed statin at length--declined.  Current medications of metformin and canagliflozin.  Needs foot and eye exam at follow up.

## 2020-03-07 NOTE — Assessment & Plan Note (Signed)
Continue current medications--- BMP today as added SGLT2 in past months.

## 2020-03-07 NOTE — Progress Notes (Signed)
    SUBJECTIVE:   CHIEF COMPLAINT / HPI:   Carmen [name] is a pleasant 59 year old woman with history of type 2 diabetes, hypertension, mood disorder, osteoarthritis and neuropathic pain presenting today for follow up.   Diabetes Patient pleased with A1C of 7.7 today. She is uncertain what she has been doing differently---has been taking medications of Invokana and Metformin. Worries about NMDA in metformin. No polyuria/polydipsia. She is certain she no longer wants to take a statin, as she is worried about her gait with this. Diet she reports continues to be poor--she typically has a honey butter chicken biscuit ([employer]) and hashbrown + coffee for breakfast.   HTN Taking medications but reports she is anxious about today. Denies chest pain, headaches, vision changes. Reports BP at home range 130-140. No symptoms of orthostasis.   Neuropathic Pain Patient worried about adverse events of gabapentin, now taking fewer times per day. She is worried about her gait--no falls. She reports her neuropathic pain is under control today.   COVID Vaccine Patient reports she is not interested in COVID vaccine .  Medications The patient brings drug labels with her highlighted to discuss. She is concerned about side effects.   PERTINENT  PMH / PSH/Family/Social History : Reviewed and updated. Patient no longer working--- previously worked at [employer].  The patient drives herself and is independent. Her mother is currently struggling with health conditions.   OBJECTIVE:   BP (!) 145/88   Pulse 83   Wt 194 lb 12.8 oz (88.4 kg)   SpO2 96%   BMI 34.51 kg/m   HEENT: Sclera anicteric. Dentition is moderate. Appears well hydrated. Neck: Supple Cardiac: Regular rate and rhythm. Normal S1/S2. No murmurs, rubs, or gallops appreciated. Lungs: Clear bilaterally to ascultation.  Abdomen: Normoactive bowel sounds. No tenderness to deep or light palpation. No rebound or guarding.  Extremities: Warm,  well perfused without edema.  Skin: Warm, dry Psych: Pleasant and appropriate     ASSESSMENT/PLAN:   Primary hypertension Continue current medications--- BMP today as added SGLT2 in past months.   Type 2 diabetes mellitus with neurological complications (HCC) Congratulated on A1C--suspect this and weight loss due to Invokana addition. Discussed statin at length--declined.  Current medications of metformin and canagliflozin.  Needs foot and eye exam at follow up.     Weight loss- 5 pounds in 6 months, suspect due to medication. She is due to multiple items of age appropriate cancer screening--will continue to address.   HCM- Declined COVID vaccine. Offered and declined CSY (barrier to care- finances).   Medication concerns-- will review highlighted materials and discuss with patient via telephone. Patient agreeable to this.   Terisa Starr, MD  Family Medicine Teaching Service  [facility]

## 2020-03-07 NOTE — Patient Instructions (Signed)
It was wonderful to see you today.  Please bring ALL of your medications with you to every visit.   Today we talked about:  - Checking cholesterol  - Think about he COVID vaccine-- Moderna  - I will call you with results and about your medications    Thank you for choosing [facility].   Please call [phone] with any questions about today's appointment.  Please be sure to schedule follow up at the front  desk before you leave today.   Terisa Starr, MD  Family Medicine

## 2020-03-08 LAB — BASIC METABOLIC PANEL
BUN/Creatinine Ratio: 16 (ref 9–23)
BUN: 9 mg/dL (ref 6–24)
CO2: 24 mmol/L (ref 20–29)
Calcium: 8.6 mg/dL — ABNORMAL LOW (ref 8.7–10.2)
Chloride: 101 mmol/L (ref 96–106)
Creatinine, Ser: 0.56 mg/dL — ABNORMAL LOW (ref 0.57–1.00)
GFR calc Af Amer: 120 mL/min/{1.73_m2} (ref >59)
GFR calc non Af Amer: 104 mL/min/{1.73_m2} (ref >59)
Glucose: 226 mg/dL — ABNORMAL HIGH (ref 65–99)
Potassium: 3.6 mmol/L (ref 3.5–5.2)
Sodium: 142 mmol/L (ref 134–144)

## 2020-03-08 LAB — LIPID PANEL
Chol/HDL Ratio: 3.6 ratio (ref 0.0–4.4)
Cholesterol, Total: 220 mg/dL — ABNORMAL HIGH (ref 100–199)
HDL: 61 mg/dL (ref >39)
LDL Chol Calc (NIH): 107 mg/dL — ABNORMAL HIGH (ref 0–99)
Triglycerides: 304 mg/dL — ABNORMAL HIGH (ref 0–149)
VLDL Cholesterol Cal: 52 mg/dL — ABNORMAL HIGH (ref 5–40)

## 2020-03-10 ENCOUNTER — Telehealth: Payer: Self-pay | Admitting: Family Medicine

## 2020-03-10 ENCOUNTER — Encounter: Payer: Self-pay | Admitting: Family Medicine

## 2020-03-10 NOTE — Telephone Encounter (Signed)
Attempted to call patient. Left generic voicemail.  If patients calls back please let her know I would like to discuss starting a cholesterol lowering medication with her.   The 10-year ASCVD risk score Denman George DC Montez Hageman., et al., 2013) is: 8%   Values used to calculate the score:     Age: 59 years     Sex: Female     Is Non-Hispanic African American: No     Diabetic: Yes     Tobacco smoker: No     Systolic Blood Pressure: 145 mmHg     Is BP treated: Yes     HDL Cholesterol: 61 mg/dL     Total Cholesterol: 220 mg/dL   Terisa Starr, MD  Family Medicine Teaching Service

## 2020-04-11 ENCOUNTER — Encounter: Payer: Self-pay | Admitting: Family Medicine

## 2020-04-14 ENCOUNTER — Other Ambulatory Visit: Payer: Self-pay

## 2020-04-14 ENCOUNTER — Ambulatory Visit (INDEPENDENT_AMBULATORY_CARE_PROVIDER_SITE_OTHER): Payer: Self-pay | Admitting: Family Medicine

## 2020-04-14 ENCOUNTER — Encounter: Payer: Self-pay | Admitting: Family Medicine

## 2020-04-14 VITALS — BP 160/80 | HR 78 | Wt 196.6 lb

## 2020-04-14 DIAGNOSIS — E1149 Type 2 diabetes mellitus with other diabetic neurological complication: Secondary | ICD-10-CM

## 2020-04-14 DIAGNOSIS — I10 Essential (primary) hypertension: Secondary | ICD-10-CM

## 2020-04-14 DIAGNOSIS — M159 Polyosteoarthritis, unspecified: Secondary | ICD-10-CM

## 2020-04-14 DIAGNOSIS — M791 Myalgia, unspecified site: Secondary | ICD-10-CM

## 2020-04-14 DIAGNOSIS — Z789 Other specified health status: Secondary | ICD-10-CM

## 2020-04-14 DIAGNOSIS — M792 Neuralgia and neuritis, unspecified: Secondary | ICD-10-CM

## 2020-04-14 DIAGNOSIS — M8949 Other hypertrophic osteoarthropathy, multiple sites: Secondary | ICD-10-CM

## 2020-04-14 DIAGNOSIS — H60311 Diffuse otitis externa, right ear: Secondary | ICD-10-CM

## 2020-04-14 MED ORDER — NEOMYCIN-POLYMYXIN-HC 3.5-10000-1 OT SOLN: 4.0000 [drp] | Freq: Four times a day (QID) | OTIC | 0 refills | Status: DC

## 2020-04-14 NOTE — Progress Notes (Signed)
SUBJECTIVE:   CHIEF COMPLAINT / HPI:   Carmen [name] is a very pleasant 40 soon-to-be 59 year old woman with history significant for type 2 diabetes, hypertension, obesity and mood disorder presenting today for routine follow-up.  Her only complaint is right ear pain.  Regards the right ear pain the patient has been feeling as though she has fluid in her ear and thus put sweet oil in it several days ago.  Since that time she has had some discomfort and irritation to the ear.  She denies changes in hearing, vertigo, fevers or drainage from the ear.  The patient continues to be compliant with her medications which were updated today.  She had multiple questions about side effects which we reviewed at length today.  She is hesitant to restart statin medication as she has ongoing joint aches.  She denies falls, weakness, bowel or bladder dysfunction.  She does have ongoing neuropathic pain related to her diabetes.  The patient currently has a number of stressors in her life.  She is no longer working at [employer].  She recently got into some disagreements with some old friends of hers.  Her mother is ill and her boyfriend Carmen [name] is undergoing back therapy.  PERTINENT  PMH / PSH/Family/Social History : Updated and reviewed   OBJECTIVE:   BP (!) 160/80   Pulse 78   Wt 196 lb 9.6 oz (89.2 kg)   SpO2 95%   BMI 34.83 kg/m   Today's weight:  Last Weight  Most recent update: 04/14/2020 10:27 AM   Weight  89.2 kg (196 lb 9.6 oz)           Review of prior weights: Filed Weights   04/14/20 1027  Weight: 196 lb 9.6 oz (89.2 kg)    Cardiac: Regular rate and rhythm. Normal S1/S2. No murmurs, rubs, or gallops appreciated. Lungs: Clear bilaterally to ascultation.  Psych: Pleasant and appropriate walking and gait observed.  She is able to arise from a chair without using her hands.  She does have a slow gait at first but immediately picks up.  She has some mild kyphosis.  No tremor, no foot  drop or gait abnormalities noted other than slight favoring of right leg over left due to knee pain which improves with movement.  The ear canals and tympanic membranes were examined.  The left ear canal and tympanic membrane are clear free of debris.  There is no effusion or bulging of the tympanic membrane. Right external auditory canal is rather erythematous and edematous.  TM visualized and it is clear without purulent drainage or bulging.  Monofilament completed and updated in tab   ASSESSMENT/PLAN:   Primary hypertension Currently well above goal, concern for variable adherence to medications.  She has been taking her losartan intermittently.  Encourage compliance she will monitor home blood pressures for the next week and message me with results.  Will send message via MyChart to remind her.  Type 2 diabetes mellitus with neurological complications (HCC) Congratulated again on recent A1c.  We reviewed her medications.  She is currently taking SGLT2 inhibitor as well as Metformin.  She is tolerating both.  We discussed statin use at length at this visit.  She is very much against statin use at this time given her friend's diagnosis of ALS.  She reports that her friend was told her statin caused her ALS.  We discussed the literature around this.  Encouraged her to think about this.  Also also offered  her ezetimibe.  She would like to do dietary changes at this time.  Foot examination completed  Osteoarthritis of multiple joints Chronic left knee arthritis which sometimes limits her mobility.  No recent falls.  Encouraged Tylenol topical therapy use and continued exercise.  Neuropathic pain Discussed side effect profile of gabapentin after her questions.  She continues to use 2 pills at night. Discussed adherence, updated medication list.   Otitis externa, this is suspected may be actually related to contact dermatitis secondary to sweet oil.  We discussed discontinuing this.   Cortisporin prescribed.  Myalgias we discussed at length.  The patient has ongoing chronic lower extremity pain which I suspect is most related to osteoarthritis.  She is certain her statin medications have worsened this and is not amenable to restarting.  Discussed at length of the past 6 months. Will continue to address, as above plan   HCM Care lengthy discussion the patient declined influenza vaccine, pneumococcal vaccine and Covid vaccine.  We discussed mammogram and colonoscopy which she also declined.  She is up-to-date on her eye exam however.  Which we celebrated today. Monitor weight  Carmen Starr, MD  Family Medicine Teaching Service  [facility]

## 2020-04-14 NOTE — Assessment & Plan Note (Signed)
Chronic left knee arthritis which sometimes limits her mobility.  No recent falls.  Encouraged Tylenol topical therapy use and continued exercise.

## 2020-04-14 NOTE — Patient Instructions (Addendum)
It was wonderful to see you today.  Please bring ALL of your medications with you to every visit.   Today we talked about:  Follow up in 2 months   Bring ALL of your medications to your next visit  Check your blood pressure three times per week---I will message you with this   EZETIMIBE--Zetia--new cholesterol pill  Ear medicine is at your pharmacy--let me know if this gets worse or does NOT improve    Thank you for choosing [facility].   Please call [phone] with any questions about today's appointment.  Please be sure to schedule follow up at the front  desk before you leave today.   Terisa Starr, MD  Family Medicine

## 2020-04-14 NOTE — Assessment & Plan Note (Signed)
Congratulated again on recent A1c.  We reviewed her medications.  She is currently taking SGLT2 inhibitor as well as Metformin.  She is tolerating both.  We discussed statin use at length at this visit.  She is very much against statin use at this time given her friend's diagnosis of ALS.  She reports that her friend was told her statin caused her ALS.  We discussed the literature around this.  Encouraged her to think about this.  Also also offered her ezetimibe.  She would like to do dietary changes at this time.  Foot examination completed

## 2020-04-14 NOTE — Assessment & Plan Note (Signed)
Discussed side effect profile of gabapentin after her questions.  She continues to use 2 pills at night. Discussed adherence, updated medication list.

## 2020-04-14 NOTE — Assessment & Plan Note (Signed)
Currently well above goal, concern for variable adherence to medications.  She has been taking her losartan intermittently.  Encourage compliance she will monitor home blood pressures for the next week and message me with results.  Will send message via MyChart to remind her.

## 2020-04-28 ENCOUNTER — Ambulatory Visit (INDEPENDENT_AMBULATORY_CARE_PROVIDER_SITE_OTHER): Payer: Self-pay | Admitting: Family Medicine

## 2020-04-28 ENCOUNTER — Encounter: Payer: Self-pay | Admitting: Family Medicine

## 2020-04-28 ENCOUNTER — Other Ambulatory Visit: Payer: Self-pay

## 2020-04-28 VITALS — BP 160/84 | HR 86 | Ht 63.0 in | Wt 191.0 lb

## 2020-04-28 DIAGNOSIS — I10 Essential (primary) hypertension: Secondary | ICD-10-CM

## 2020-04-28 DIAGNOSIS — Z6833 Body mass index (BMI) 33.0-33.9, adult: Secondary | ICD-10-CM

## 2020-04-28 DIAGNOSIS — E6609 Other obesity due to excess calories: Secondary | ICD-10-CM

## 2020-04-28 MED ORDER — AMLODIPINE BESYLATE 5 MG PO TABS: 5.0000 mg | ORAL_TABLET | Freq: Every day | ORAL | 3 refills | Status: DC

## 2020-04-28 NOTE — Assessment & Plan Note (Signed)
Discussed dietary changes.

## 2020-04-28 NOTE — Assessment & Plan Note (Signed)
After discussion and review of side effects patient agreeable to starting amlodipine 5 mg.  Follow-up with blood pressure measurements at home.  Return to care in 2 months for checkup on blood pressure.

## 2020-04-28 NOTE — Progress Notes (Signed)
    SUBJECTIVE:   CHIEF COMPLAINT / HPI:   Carmen [name] is a pleasant 60 year old woman with history significant for type 2 diabetes, hypertension, obesity and recurrent otitis media as a child to the right ear presenting today for ear follow-up.  At her last visit the patient was diagnosed with otitis externa after she has had several days of pain and erythema in the ear.  She had been using mineral oil.  This was recommended to be stopped and she was given Cortisporin prescription.  She reports persistent problems with her right ear.  She has been using the Cortisporin without relief.  She has been using a Q-tip several times a day.  She denies tinnitus, changes in her hearing fevers or dental pain.  The patient reports compliance with her blood pressure medications of carvedilol, losartan and spironolactone.  She denies chest pain, headaches, dizziness or blurry vision.  She reports overall she is trying to make dietary changes but finds it hard given her financial stressors.  The patient has several stressors in her life.  Her boyfriend, Rob recently was terminated from his job.  Several of her grandchildren are moving and her daughter has an unstable housing situation.  She is looking forward to Thanksgiving and feels overall her mood is doing okay.   PERTINENT  PMH / PSH/Family/Social History : Reviewed and updated as appropriate  OBJECTIVE:   BP (!) 160/84   Pulse 86   Ht 5\' 3"  (1.6 m)   Wt 191 lb (86.6 kg)   SpO2 97%   BMI 33.83 kg/m   Today's weight:  Last Weight  Most recent update: 04/28/2020  9:48 AM   Weight  86.6 kg (191 lb)           Review of prior weights: 04/30/2020   04/28/20 0948  Weight: 191 lb (86.6 kg)    Left ear canal is clean with minimal wax TM appreciated without abnormality.  Right external auditory canal still appears erythematous on posterior aspect.  There is impacted cerumen along the tympanic membrane which was gently removed with  irrigation.  After inspection once again the tympanic membrane itself was scarred on the right with out perforation or evidence of effusion.   Cardiac: Regular rate and rhythm. Normal S1/S2. No murmurs, rubs, or gallops appreciated. Lungs: Clear bilaterally to ascultation.  Psych: Pleasant and appropriate   ASSESSMENT/PLAN:   Primary hypertension uncontrolled requiring further management and medications  After discussion and review of side effects patient agreeable to starting amlodipine 5 mg.  Follow-up with blood pressure measurements at home.  Return to care in 2 months for checkup on blood pressure. Continue to discuss dietary changes   Obesity Discussed dietary changes.   HCM Due for eye exam, declined due to insurance.  Declined Flu and COVID--discussed   04/30/20, MD  Family Medicine Teaching Service  [facility]

## 2020-04-28 NOTE — Patient Instructions (Signed)
It was wonderful to see you today.  Please bring ALL of your medications with you to every visit.   Today we talked about:  - Starting a blood pressure pill---let me know if you have side effects  -  NO QTIPS NO SWEET OIL  - Restart the drops on Wednesday  Message me if your symptoms worsen    Thank you for choosing [facility].   Please call [phone] with any questions about today's appointment.  Please be sure to schedule follow up at the front  desk before you leave today.   Terisa Starr, MD  Family Medicine

## 2020-06-03 ENCOUNTER — Other Ambulatory Visit: Payer: Self-pay | Admitting: Family Medicine

## 2020-06-30 ENCOUNTER — Encounter: Payer: Self-pay | Admitting: Family Medicine

## 2020-06-30 NOTE — Telephone Encounter (Signed)
Responded in 2nd note.   Carmen Starr, MD  Family Medicine Teaching Service

## 2020-07-14 ENCOUNTER — Telehealth: Payer: Self-pay

## 2020-07-14 DIAGNOSIS — B373 Candidiasis of vulva and vagina: Secondary | ICD-10-CM

## 2020-07-14 DIAGNOSIS — B3731 Acute candidiasis of vulva and vagina: Secondary | ICD-10-CM

## 2020-07-14 NOTE — Telephone Encounter (Signed)
Patients call nurse line requesting diflucan to her pharmacy. Patient reports she has an apt with PCP on 2/25, however she does not want to wait that long for treatment. Patient reports her sugars were running higher than normal last week and has now developed a yeast infection. Patient reports feeling raw, vaginal discharge, and "yeasty" odor. Will forward to PCP.

## 2020-07-14 NOTE — Telephone Encounter (Signed)
Attempted to call patient regarding symptoms.   If having issues with transportation, can ask CCM to help with this--recommend in person evaluation if possible.  If patient calls back, please identify convenient time for her.  Terisa Starr, MD  Family Medicine Teaching Service

## 2020-07-15 MED ORDER — FLUCONAZOLE 150 MG PO TABS: 150.0000 mg | ORAL_TABLET | Freq: Once | ORAL | 0 refills | Status: AC

## 2020-07-15 NOTE — Addendum Note (Signed)
Addended by: Manson Passey, Alin Hutchins on: 07/15/2020 12:22 PM   Modules accepted: Orders

## 2020-07-15 NOTE — Telephone Encounter (Signed)
Patient returns call to nurse line regarding rx for yeast infection. States that she has tried nystatin and hemorrhoid cream for discomfort, with little relief.   Patient is not wanting to make appointment at this time. Patient states that provider can call her anytime if she needs to speak with her regarding concern.   Veronda Prude, RN

## 2020-07-15 NOTE — Telephone Encounter (Signed)
Called, having white thick discharge and pruritis. No vaginal bleeding. Agreeable to exam at follow up. Has already been to UC and received topicals without relief.   Reviewed risks, QTc appropriate but will need to be cautious.  Terisa Starr, MD  Family Medicine Teaching Service

## 2020-08-01 ENCOUNTER — Other Ambulatory Visit: Payer: Self-pay

## 2020-08-01 ENCOUNTER — Encounter: Payer: Self-pay | Admitting: Family Medicine

## 2020-08-01 ENCOUNTER — Ambulatory Visit
Admission: RE | Admit: 2020-08-01 | Discharge: 2020-08-01 | Disposition: A | Discharge status: Home / Self Care | Payer: Self-pay | Source: Ambulatory Visit | Attending: Family Medicine | Admitting: Family Medicine

## 2020-08-01 ENCOUNTER — Ambulatory Visit (INDEPENDENT_AMBULATORY_CARE_PROVIDER_SITE_OTHER): Payer: Self-pay | Admitting: Family Medicine

## 2020-08-01 VITALS — BP 139/88 | HR 76 | Wt 198.0 lb

## 2020-08-01 DIAGNOSIS — G8929 Other chronic pain: Secondary | ICD-10-CM

## 2020-08-01 DIAGNOSIS — I10 Essential (primary) hypertension: Secondary | ICD-10-CM

## 2020-08-01 DIAGNOSIS — E1149 Type 2 diabetes mellitus with other diabetic neurological complication: Secondary | ICD-10-CM

## 2020-08-01 DIAGNOSIS — M25571 Pain in right ankle and joints of right foot: Secondary | ICD-10-CM

## 2020-08-01 DIAGNOSIS — E119 Type 2 diabetes mellitus without complications: Secondary | ICD-10-CM

## 2020-08-01 LAB — POCT GLYCOSYLATED HEMOGLOBIN (HGB A1C): Hemoglobin A1C: 7.6 % — AB (ref 4.0–5.6)

## 2020-08-01 MED ORDER — GABAPENTIN 300 MG PO CAPS: 600.0000 mg | ORAL_CAPSULE | Freq: Every day | ORAL | 3 refills | Status: DC

## 2020-08-01 MED ORDER — SPIRONOLACTONE 25 MG PO TABS: 25.0000 mg | ORAL_TABLET | Freq: Every day | ORAL | 3 refills | Status: DC

## 2020-08-01 NOTE — Assessment & Plan Note (Signed)
At goal on repeat.  Not due for a metabolic panel today.  Discussed lower salt diet.

## 2020-08-01 NOTE — Patient Instructions (Addendum)
It was wonderful to see you today.  Please bring ALL of your medications with you to every visit.   Today we talked about:  -- [place] work with your medications  -- Go get an ankle xray   GREAT JOB WITH YOUR A1C  Limit night time snacks--try buying sugar free candies for night  Go get your foot x-ray   Thank you for choosing [facility].   Please call [phone] with any questions about today's appointment.  FOLLOW UP IN 3 MONTHS   Terisa Starr, MD  Family Medicine

## 2020-08-01 NOTE — Progress Notes (Signed)
    SUBJECTIVE:   CHIEF COMPLAINT / HPI:   Carmen [name] is a pleasant 60 year old woman with history of type 2 diabetes, obesity, hypertension, neuropathic pain syndrome and depression presenting today for routine follow-up.  The patient reports she needs a number of medication refills and has several updates.  In terms of the patient's diabetes she reports she bought an air Eunice Blase and has been trying to eat less fried foods.  She is pleased with her A1c.  She is not particularly interested in significant dietary changes at this time.  She is having no side effects of her SGLT2 inhibitor or other medications at this time.  She denies polyuria polydipsia.  She denies hypoglycemia.  The patient reports that she has had ongoing right ankle pain for several weeks.  No redness, swelling or fevers.  She reports she stretched this 'strangely' 1 day getting out of the chair.  She denies falls or injury.  She denies numbness tingling or weakness in the leg.  She does have history of chronic back problems.  She wears sneakers  PERTINENT  PMH / PSH/Family/Social History : updated and reviewed    OBJECTIVE:   BP 139/88   Pulse 76   Wt 198 lb (89.8 kg)   SpO2 97%   BMI 35.07 kg/m   Today's weight:  Last Weight  Most recent update: 08/01/2020 10:17 AM   Weight  89.8 kg (198 lb)           Review of prior weights: Filed Weights   08/01/20 1017  Weight: 198 lb (89.8 kg)     Cardiac: Regular rate and rhythm. Normal S1/S2. No murmurs, rubs, or gallops appreciated. Lungs: Clear bilaterally to ascultation.  MSK Pes planus greater on the right as compared to left. Tenderness to palpation along distal aspect of right Achilles.  No tenderness to palpation along posterior aspect of lateral medial malleolus.  There is no effusion there is no erythema there is no warmth to the joint. Psych: Pleasant and appropriate     ASSESSMENT/PLAN:   Primary hypertension At goal on repeat.  Not due  for a metabolic panel today.  Discussed lower salt diet.  Type 2 diabetes mellitus with neurological complications (HCC) Congratulated on A1c.  We discussed dietary changes at length.  Foot exam completed.  Referred to ophthalmology for diabetic eye exam.  She brought her previous diabetic eye exam with her today but this is an optometry exam without dilation.    Right ankle pain--most likely due to Achilles tendinitis, potentially due to sprain, osteoarthritis also considered.  Less likely this is related to neuropathic injury from diabetes.  Where she localizes the pain to is somewhat along the Achilles.  Will obtain x-ray to evaluate for arthritis.  HCM Declined Covid vaccine, she was previously declined colonoscopy due to lack of finances.  We discussed.   Terisa Starr, MD  Family Medicine Teaching Service  [facility]

## 2020-08-01 NOTE — Assessment & Plan Note (Signed)
Congratulated on A1c.  We discussed dietary changes at length.  Foot exam completed.

## 2020-08-04 ENCOUNTER — Encounter: Payer: Self-pay | Admitting: Family Medicine

## 2020-09-17 ENCOUNTER — Other Ambulatory Visit: Payer: Self-pay

## 2020-09-17 DIAGNOSIS — I10 Essential (primary) hypertension: Secondary | ICD-10-CM

## 2020-09-17 DIAGNOSIS — E119 Type 2 diabetes mellitus without complications: Secondary | ICD-10-CM

## 2020-09-17 MED ORDER — METFORMIN HCL ER 500 MG PO TB24: ORAL_TABLET | ORAL | 5 refills | Status: DC

## 2020-09-17 MED ORDER — LOSARTAN POTASSIUM 100 MG PO TABS: 100.0000 mg | ORAL_TABLET | Freq: Every day | ORAL | 3 refills | Status: DC

## 2020-10-06 ENCOUNTER — Telehealth: Payer: Self-pay

## 2020-10-06 DIAGNOSIS — U071 COVID-19: Secondary | ICD-10-CM

## 2020-10-06 MED ORDER — MOLNUPIRAVIR EUA 200MG CAPSULE: 4.0000 | ORAL_CAPSULE | Freq: Two times a day (BID) | ORAL | 0 refills | Status: AC

## 2020-10-06 NOTE — Telephone Encounter (Signed)
Called patient to check in. She reports she has congestion, reduced appetite. Symptoms started Thursday, so on day of 4 of illness. No chest pain, shortness of breath. Has had 1-2 episodes of vomiting.   She will hold losartan, invokana, and spironolactone. Can continue other antihypertensives.  Given risk factors (diabetes, obesity, HTN, age), prescription for molnupiravir 800 mg BID for 5 days to [facility] (not patients' typical pharmacy, [facility] does not have medication, called [facility] and confirmed they have medication, medication is free at [facility]) . Reviewed side effects, data, limitations, and quarantine precautions. Recommended strict separation precautions from mother.    Terisa Starr, MD  Family Medicine Teaching Service

## 2020-10-06 NOTE — Telephone Encounter (Signed)
Patient calls nurse line reporting positive covid test this morning. Patient just wanted to let PCP know. I attempted to call her to see how she is feeling, however no answer. Will forward to PCP.

## 2020-10-08 NOTE — Telephone Encounter (Addendum)
Patient calls nurse line to inform Dr. Manson Passey of her rise in blood sugar levels. Patient reports that fasting CBG this AM was 167. Throughout the day, patient has had blood sugars of 316 and 303. Patient has not had any symptoms of hyperglycemia and continues to be asymptomatic. Patient also wanted to let provider know that she has not taken gabapentin since Monday, as she could not remember if she was supposed to hold medication.   Patient is requesting additional instructions on medication management for diabetes. Provided with strict ED precautions.   Veronda Prude, RN

## 2020-10-09 ENCOUNTER — Encounter: Payer: Self-pay | Admitting: Family Medicine

## 2020-10-09 NOTE — Telephone Encounter (Signed)
Called patient back, no answer. Left generic HIPPA compliant voicemail.  Terisa Starr, MD  Family Medicine Teaching Service

## 2020-10-15 NOTE — Telephone Encounter (Signed)
Patient calls nurse line to report that she is still having symptoms of COVID. Patient reports that she continues to have cough and congestion with some episodes of vomiting.   Patient checked fasting blood sugar during conversation- blood sugar 311. Patient is currently asymptomatic.   Advised of supportive measures. Will forward to PCP for any additional recommendations.   Veronda Prude, RN

## 2020-10-15 NOTE — Telephone Encounter (Signed)
Called patient, no answer. Left generic voicemail to call back and schedule visit.  Given duration of symptoms and glucose, recommend being seen. She is >5 days from diagnosis, would still recommend CIDD clinic given ongoing symptoms (not yet 10 days out). Needs BMP at follow up.   Terisa Starr, MD  Family Medicine Teaching Service

## 2020-10-27 ENCOUNTER — Encounter: Payer: Self-pay | Admitting: Family Medicine

## 2020-10-28 ENCOUNTER — Telehealth: Payer: Self-pay | Admitting: Family Medicine

## 2020-10-28 NOTE — Telephone Encounter (Signed)
Attempted to call patient after recent stressor (COVID and loss of her mother). Family member answered, let them know I would message her.   Terisa Starr, MD  Family Medicine Teaching Service

## 2020-11-24 ENCOUNTER — Other Ambulatory Visit: Payer: Self-pay

## 2020-11-24 ENCOUNTER — Encounter: Payer: Self-pay | Admitting: Family Medicine

## 2020-11-24 ENCOUNTER — Ambulatory Visit (INDEPENDENT_AMBULATORY_CARE_PROVIDER_SITE_OTHER): Payer: Self-pay | Admitting: Family Medicine

## 2020-11-24 VITALS — BP 178/92 | HR 75 | Wt 191.4 lb

## 2020-11-24 DIAGNOSIS — E1149 Type 2 diabetes mellitus with other diabetic neurological complication: Secondary | ICD-10-CM

## 2020-11-24 DIAGNOSIS — I10 Essential (primary) hypertension: Secondary | ICD-10-CM

## 2020-11-24 DIAGNOSIS — M791 Myalgia, unspecified site: Secondary | ICD-10-CM

## 2020-11-24 DIAGNOSIS — F331 Major depressive disorder, recurrent, moderate: Secondary | ICD-10-CM

## 2020-11-24 LAB — POCT GLYCOSYLATED HEMOGLOBIN (HGB A1C): HbA1c, POC (controlled diabetic range): 8.2 % — AB (ref 0.0–7.0)

## 2020-11-24 NOTE — Assessment & Plan Note (Signed)
Doing well, supportive listening provided

## 2020-11-24 NOTE — Assessment & Plan Note (Signed)
Not at goal, no symptoms Repeat value also elevated Increased amlodipine to 10 mg Follow up in 1 month (with Pap at that time too)

## 2020-11-24 NOTE — Assessment & Plan Note (Signed)
A1C slightly elevated Discussed dietary interventions Refilled medications--has been off when she had COVID19

## 2020-11-24 NOTE — Patient Instructions (Signed)
It was wonderful to see you today.  Please bring ALL of your medications with you to every visit.   Today we talked about:  --Going to the lab for blood work  - Follow up in 1 month for blood pressure and Pap    Thank you for choosing [facility].   Please call [phone] with any questions about today's appointment.  Please be sure to schedule follow up at the front  desk before you leave today.   Terisa Starr, MD  Family Medicine

## 2020-11-24 NOTE — Progress Notes (Signed)
    SUBJECTIVE:   CHIEF COMPLAINT: medication check and BP  HPI:   Carmen [name] is a 60 y.o. yo with history notable for type 2 diabetes and HTN presenting for follow up. Since our last visit, she has recovered from COVID. Her grandson Ephriam Knuckles (age 47) is doing well. Her mother in law (robs' mother) passed away. She is not working currently   The patient endorses intermittent sensation that 'something is caught in my throat'. Endorses nasal congestion from allergies, not taking anti-histamine. Reports some belching with this, has improved with PPI. Denies hematochezia, melena, GI symptoms. Declines going to GI.   Patient reports compliance with metformin and SGLT2i. No side effects. No polyuria or polydipsia. Endorses dietary indiscretions. Has a number of home stressors    PERTINENT  PMH / PSH/Family/Social History : Updated   OBJECTIVE:   BP (!) 178/92   Pulse 75   Wt 191 lb 6.4 oz (86.8 kg)   SpO2 98%   BMI 33.90 kg/m   Today's weight:  Last Weight  Most recent update: 11/24/2020 12:05 PM    Weight  86.8 kg (191 lb 6.4 oz)            Review of prior weights: Filed Weights   11/24/20 1147  Weight: 191 lb 6.4 oz (86.8 kg)     Cardiac: Regular rate and rhythm. Normal S1/S2. No murmurs, rubs, or gallops appreciated. Lungs: Clear bilaterally to ascultation.  Abdomen: Normoactive bowel sounds. No tenderness to deep or light palpation. No rebound or guarding.   Psych: Pleasant and appropriate  Foot exam notable for pez cavus no ulcers normal pulses and sensation on R   ASSESSMENT/PLAN:   Primary hypertension Not at goal, no symptoms Repeat value also elevated Increased amlodipine to 10 mg Follow up in 1 month (with Pap at that time too)  BMP today as restarted meds (post-COVID)   Type 2 diabetes mellitus with neurological complications (HCC) A1C slightly elevated Discussed dietary interventions Refilled medications--has been off when she had COVID19   Discuss statin again at follow up   Major depressive disorder, recurrent episode (HCC) Doing well, supportive listening provided    Globus sensation, discussed, recommend restarting antihistamine and H2 blocker.   HCM Decline COVID vaccine and Shingles vaccine  Declined pap-will obtain at follow up  Declined GI referral    At follow up Statin and Pap     Terisa Starr, MD  Family Medicine Teaching Service  [facility]

## 2020-11-25 LAB — BASIC METABOLIC PANEL
BUN/Creatinine Ratio: 21 (ref 9–23)
BUN: 12 mg/dL (ref 6–24)
CO2: 23 mmol/L (ref 20–29)
Calcium: 8.7 mg/dL (ref 8.7–10.2)
Chloride: 102 mmol/L (ref 96–106)
Creatinine, Ser: 0.56 mg/dL — ABNORMAL LOW (ref 0.57–1.00)
Glucose: 180 mg/dL — ABNORMAL HIGH (ref 65–99)
Potassium: 3.8 mmol/L (ref 3.5–5.2)
Sodium: 142 mmol/L (ref 134–144)
eGFR: 106 mL/min/{1.73_m2} (ref >59)

## 2020-11-26 ENCOUNTER — Telehealth: Payer: Self-pay | Admitting: Family Medicine

## 2020-11-26 DIAGNOSIS — I10 Essential (primary) hypertension: Secondary | ICD-10-CM

## 2020-11-26 MED ORDER — AMLODIPINE BESYLATE 10 MG PO TABS: 10.0000 mg | ORAL_TABLET | Freq: Every day | ORAL | 3 refills | Status: DC

## 2020-11-26 NOTE — Telephone Encounter (Signed)
Called with normal results.  ?Carmen Geist, MD  ?Family Medicine Teaching Service  ? ?

## 2020-12-09 ENCOUNTER — Other Ambulatory Visit: Payer: Self-pay | Admitting: Pharmacist

## 2020-12-29 ENCOUNTER — Ambulatory Visit: Payer: Self-pay | Admitting: Family Medicine

## 2021-01-14 ENCOUNTER — Telehealth: Payer: Self-pay

## 2021-01-14 NOTE — Telephone Encounter (Signed)
Called patient to discuss. Had 3 mm stone, feeling better. Discussed increasing water, reasons to call back. All questions answered.   Terisa Starr, MD  Family Medicine Teaching Service

## 2021-01-14 NOTE — Telephone Encounter (Signed)
Patient LVM on nurse line requesting to speak with Dr. Manson Passey regarding ED visit from yesterday. ED evaluation revealed 3 mm kidney stone.   Attempted to call patient back to gather more information. Patient was unavailable when I returned call. Left HIPAA compliant message with family member to have patient return call to office when she is available.   Veronda Prude, RN

## 2021-01-15 ENCOUNTER — Telehealth: Payer: Self-pay | Admitting: Family Medicine

## 2021-01-15 DIAGNOSIS — R300 Dysuria: Secondary | ICD-10-CM

## 2021-01-15 NOTE — Telephone Encounter (Signed)
Dipstick and culture ordered.   Terisa Starr, MD  Family Medicine Teaching Service

## 2021-01-15 NOTE — Telephone Encounter (Signed)
Patient called and wanted to let Dr. Manson Passey know that she is still having issues with urine being cloudy. She spoke with Dr. Manson Passey yesterday and she would like to come in for UA.   I spoke with Dr. Manson Passey and she is placing an order and have scheduled patient a lab appointment for tomorrow 01/16/21.

## 2021-01-16 ENCOUNTER — Other Ambulatory Visit: Payer: Self-pay

## 2021-01-16 ENCOUNTER — Other Ambulatory Visit (INDEPENDENT_AMBULATORY_CARE_PROVIDER_SITE_OTHER): Payer: Self-pay

## 2021-01-16 DIAGNOSIS — R300 Dysuria: Secondary | ICD-10-CM

## 2021-01-16 LAB — POCT UA - MICROSCOPIC ONLY

## 2021-01-16 LAB — POCT URINALYSIS DIP (MANUAL ENTRY)
Bilirubin, UA: NEGATIVE
Glucose, UA: >=1000 mg/dL — AB
Ketones, POC UA: NEGATIVE mg/dL
Leukocytes, UA: NEGATIVE
Nitrite, UA: NEGATIVE
Protein Ur, POC: 100 mg/dL — AB
Spec Grav, UA: 1.01 (ref 1.010–1.025)
Urobilinogen, UA: 0.2 E.U./dL
pH, UA: 5.5 (ref 5.0–8.0)

## 2021-01-18 LAB — URINE CULTURE

## 2021-01-19 NOTE — Telephone Encounter (Signed)
Patient returns call to nurse line regarding urinary issues. Patient provided urine sample on Friday afternoon. Patient is requesting to speak with provider regarding these results and receiving an antibiotic.   Please return call to patient at [phone]. Reports that VM can be left on this number.   Veronda Prude, RN

## 2021-01-19 NOTE — Telephone Encounter (Signed)
Called patient with results.Discussed negative urine culture. Patient having issues with BG (being high) and she is try to wean medications she reports. Advised to continue metformin and Invokana.  Nursing- please schedule for follow up this week with any provider.   Thank you Terisa Starr, MD  [facility] Medicine Teaching Service

## 2021-01-20 ENCOUNTER — Other Ambulatory Visit: Payer: Self-pay

## 2021-01-20 ENCOUNTER — Encounter: Payer: Self-pay | Admitting: Family Medicine

## 2021-01-20 ENCOUNTER — Ambulatory Visit (INDEPENDENT_AMBULATORY_CARE_PROVIDER_SITE_OTHER): Payer: Self-pay | Admitting: Family Medicine

## 2021-01-20 VITALS — BP 171/96 | HR 74 | Ht 63.0 in | Wt 186.5 lb

## 2021-01-20 DIAGNOSIS — N132 Hydronephrosis with renal and ureteral calculous obstruction: Secondary | ICD-10-CM

## 2021-01-20 DIAGNOSIS — E1149 Type 2 diabetes mellitus with other diabetic neurological complication: Secondary | ICD-10-CM

## 2021-01-20 DIAGNOSIS — I10 Essential (primary) hypertension: Secondary | ICD-10-CM

## 2021-01-20 DIAGNOSIS — R109 Unspecified abdominal pain: Secondary | ICD-10-CM

## 2021-01-20 DIAGNOSIS — N760 Acute vaginitis: Secondary | ICD-10-CM

## 2021-01-20 DIAGNOSIS — R300 Dysuria: Secondary | ICD-10-CM

## 2021-01-20 LAB — POCT URINALYSIS DIP (MANUAL ENTRY)
Bilirubin, UA: NEGATIVE
Blood, UA: NEGATIVE
Glucose, UA: >=1000 mg/dL — AB
Ketones, POC UA: NEGATIVE mg/dL
Leukocytes, UA: NEGATIVE
Nitrite, UA: NEGATIVE
Protein Ur, POC: 30 mg/dL — AB
Spec Grav, UA: 1.01 (ref 1.010–1.025)
Urobilinogen, UA: 0.2 E.U./dL
pH, UA: 5.5 (ref 5.0–8.0)

## 2021-01-20 LAB — POCT UA - MICROSCOPIC ONLY

## 2021-01-20 LAB — POCT WET PREP (WET MOUNT)
Clue Cells Wet Prep Whiff POC: NEGATIVE
Trichomonas Wet Prep HPF POC: ABSENT

## 2021-01-20 LAB — GLUCOSE, POCT (MANUAL RESULT ENTRY): POC Glucose: 300 mg/dl — AB (ref 70–99)

## 2021-01-20 MED ORDER — FLUCONAZOLE 150 MG PO TABS: 150.0000 mg | ORAL_TABLET | Freq: Once | ORAL | 0 refills | Status: AC

## 2021-01-20 NOTE — Assessment & Plan Note (Signed)
Uncontrolled/resistant HTN Declined medication adjustment. F/U with PCP discussed.

## 2021-01-20 NOTE — Assessment & Plan Note (Signed)
Uncontrolled with hyperglycemia. CBG done during this visit >300 I attempted medication counseling and adjustment as well as referral to pharmacy clinic. She declined all.  SHe specifically stated that insulin is off the table conversation for her. She prefers to discuss further with her PCP and already has a f/u appointment. F/U as needed.

## 2021-01-20 NOTE — Patient Instructions (Signed)

## 2021-01-20 NOTE — Assessment & Plan Note (Signed)
Wet prep completed during this visit. + for yeast I called and discussed result with her after visit. Diflucan escribed. She was appreciative of this.

## 2021-01-20 NOTE — Progress Notes (Addendum)
    SUBJECTIVE:   CHIEF COMPLAINT / HPI:   DM2:  The patient is here for hyperglycemia. Had not been compliant with her Metformin. She is trying to wean herself off all medications and will like to only be on 3 medications if possible. No other concerns.  HTN:  The patient is compliant with her meds but trying to get off them. Her last dose of Norvasc 10 mg. Losartan 100 mg, Aldactone 25 mg, and Coreg 25 mg were a few minutes before this visit. No other concerns.   Flank pain: The patient was recently evaluated at the ED for left flank pain which she describes as a burning sensation associated with intermittent abdominal pain, change in urine color, and odor without dysuria. She also endorses a painful vagina without vaginal discharge. She was d/c home with urology f/u instruction but did not follow through due to insurance concerns.   PERTINENT  PMH / PSH: PMX reviewed  OBJECTIVE:   Vitals:   01/20/21 0955 01/20/21 1021  BP: (!) 170/67 (!) 171/96  Pulse: 74   SpO2: 98%   Weight: 186 lb 8 oz (84.6 kg)   Height: 5\' 3"  (1.6 m)     Physical Exam Vitals and nursing note reviewed. Exam conducted with a chaperone present Legette).  Cardiovascular:     Rate and Rhythm: Normal rate and regular rhythm.     Heart sounds: Normal heart sounds. No murmur heard. Pulmonary:     Effort: Pulmonary effort is normal. No respiratory distress.     Breath sounds: Normal breath sounds. No wheezing.  Genitourinary:    Labia:        Right: No rash, tenderness or lesion.        Left: No rash, tenderness or lesion.      Comments: Scanty whitish discharge.    ASSESSMENT/PLAN:   Type 2 diabetes mellitus with neurological complications (HCC) Uncontrolled with hyperglycemia. CBG done during this visit >300 I attempted medication counseling and adjustment as well as referral to pharmacy clinic. She declined all.  SHe specifically stated that insulin is off the table conversation for  her. She prefers to discuss further with her PCP and already has a f/u appointment. F/U as needed.  Primary hypertension Uncontrolled/resistant HTN Declined medication adjustment. F/U with PCP discussed.  Flank pain Repeat UA today is negative for UTI. ED visit from 01/13/21 reviewed.CT report - Obstructive tiny 3 mm mid ureteral calculus with mild hydronephrosis versus a possible partially obstructive transitional cell carcinoma. Evaluation is partially limited by noncontrast technique. Recommend nonemergent urologic evaluation.  I discussed this with her and she agreed with urology referral. Referral coordinator to help with appropriate referral placement per her insurance status. Continue good hydration as previously advised by her PCP.   Vaginitis STD screen offered, she has been with same partner for many years, hence, she declined it.  Wet prep completed during this visit. + for yeast I called and discussed result with her after visit. Diflucan escribed. She was appreciative of this.     More than 50% of this 50 minutes face to face encounter was spent on record review, examination, and coordination of care.   03/15/21, MD [facility]

## 2021-01-20 NOTE — Assessment & Plan Note (Addendum)
Repeat UA today is negative for UTI. ED visit from 01/13/21 reviewed.CT report - Obstructive tiny 3 mm mid ureteral calculus with mild hydronephrosis versus a possible partially obstructive transitional cell carcinoma. Evaluation is partially limited by noncontrast technique. Recommend nonemergent urologic evaluation.  I discussed this with her and she agreed with urology referral. Referral coordinator to help with appropriate referral placement per her insurance status. Continue good hydration as previously advised by her PCP.

## 2021-01-20 NOTE — Addendum Note (Signed)
Addended by: Janit Pagan T on: 01/20/2021 03:40 PM   Modules accepted: Orders

## 2021-01-29 ENCOUNTER — Encounter: Payer: Self-pay | Admitting: Family Medicine

## 2021-01-30 MED ORDER — FLUCONAZOLE 150 MG PO TABS: 150.0000 mg | ORAL_TABLET | ORAL | 0 refills | Status: DC

## 2021-01-30 NOTE — Telephone Encounter (Signed)
Called patient to discuss. Her discharge improved after fluconazole X1 then had recurrence as BG was elevated. Rx for fluconazole  for two dose series. May need to stop SGLT2 at next visit.  Terisa Starr, MD  Family Medicine Teaching Service

## 2021-02-04 ENCOUNTER — Encounter (HOSPITAL_COMMUNITY): Payer: Self-pay

## 2021-02-04 ENCOUNTER — Ambulatory Visit (HOSPITAL_COMMUNITY)
Admission: EM | Admit: 2021-02-04 | Discharge: 2021-02-04 | Disposition: A | Discharge status: Home / Self Care | Payer: Self-pay | Attending: Family | Admitting: Family

## 2021-02-04 ENCOUNTER — Encounter: Payer: Self-pay | Admitting: Family Medicine

## 2021-02-04 DIAGNOSIS — Z87442 Personal history of urinary calculi: Secondary | ICD-10-CM | POA: Insufficient documentation

## 2021-02-04 DIAGNOSIS — E1129 Type 2 diabetes mellitus with other diabetic kidney complication: Secondary | ICD-10-CM | POA: Insufficient documentation

## 2021-02-04 DIAGNOSIS — E1165 Type 2 diabetes mellitus with hyperglycemia: Secondary | ICD-10-CM | POA: Insufficient documentation

## 2021-02-04 DIAGNOSIS — R3129 Other microscopic hematuria: Secondary | ICD-10-CM | POA: Insufficient documentation

## 2021-02-04 DIAGNOSIS — N898 Other specified noninflammatory disorders of vagina: Secondary | ICD-10-CM | POA: Insufficient documentation

## 2021-02-04 DIAGNOSIS — I10 Essential (primary) hypertension: Secondary | ICD-10-CM | POA: Insufficient documentation

## 2021-02-04 DIAGNOSIS — IMO0002 Reserved for concepts with insufficient information to code with codable children: Secondary | ICD-10-CM

## 2021-02-04 LAB — POCT URINALYSIS DIPSTICK, ED / UC
Bilirubin Urine: NEGATIVE
Glucose, UA: >=1000 mg/dL — AB
Ketones, ur: NEGATIVE mg/dL
Leukocytes,Ua: NEGATIVE
Nitrite: NEGATIVE
Protein, ur: >=300 mg/dL — AB
Specific Gravity, Urine: 1.025 (ref 1.005–1.030)
Urobilinogen, UA: 0.2 mg/dL (ref 0.0–1.0)
pH: 5.5 (ref 5.0–8.0)

## 2021-02-04 MED ORDER — ITRACONAZOLE 100 MG PO CAPS: 100.0000 mg | ORAL_CAPSULE | Freq: Every day | ORAL | 0 refills | Status: AC

## 2021-02-04 NOTE — ED Triage Notes (Signed)
Pt c/o vaginal odor and aching starting earlier this month.  Was tx for yeast infection twice in last month. Last Diflucan was 08/29 and was 2nd that week.  Ongoing s/s.  Also c/o lower back pain and 'frothy' urine.

## 2021-02-04 NOTE — Discharge Instructions (Addendum)
Recommend start Itraconazole 100mg  daily for 10 days. If you are unable to afford this medication, then you should trial OTC anti-fungal medication such as Monistat or Gyne-Lotrimin as directed. Continue to push fluids. Since your sugars are high, please take your diabetic medication as directed. Recommend follow-up pending urine culture and vaginal swab testing as well as with your Urologist and PCP as planned.

## 2021-02-05 LAB — URINE CULTURE: Culture: NO GROWTH

## 2021-02-05 LAB — CERVICOVAGINAL ANCILLARY ONLY
Bacterial Vaginitis (gardnerella): NEGATIVE
Candida Glabrata: POSITIVE — AB
Candida Vaginitis: NEGATIVE
Comment: NEGATIVE
Comment: NEGATIVE
Comment: NEGATIVE

## 2021-02-05 NOTE — ED Provider Notes (Signed)
[facility]    CSN: [id number] Arrival date & time: 02/04/21  1000      History   Chief Complaint Chief Complaint  Patient presents with   Vaginitis    HPI Carmen [name] is a 60 y.o. female.   60 year old female presents with vaginal irritation and odor for the past month. Was first seen in the ER on 01/13/21 for dysuria and dx with left renal calculi but concern over possible carcinoma so referral to Urologist was placed and she has an appointment next week on Sept 8th. About 1 week later, she went to her PCP for follow-up and for vaginal irritation and her wet prep showed positive yeast. She was given 1 Diflucan and told to work on lowering her sugar levels since her diabetes was not under good control. She called her PCP again over a week later and indicated that her vaginal symptoms did not resolve. They prescribed 2 more doses of Diflucan and told to follow-up if not improving. She was unable to see her PCP so she came to Urgent Care for further evaluation since she is still experiencing vaginal irritation and odor. Urine appears more frothy and dark. Also having left lower back and mid side pain along where renal calculi was last seen on CT. Admits that sugar levels have been over 200 to 300 recently and not eating well. Sugar level at home this AM was 206.. Going out of town and wanted to try to clear symptoms before leaving. Denies any fever, upper abdominal pain, nausea or vomiting. Has had some loose stools. No distinct pain with urination. No vaginal bleeding. Other chronic health issues include type 2 DM poorly controlled, HTN, GERD, irritable bowel, neuropathic pain, arthritis, mood disorder and environmental allergies. Currently on Norvasc, Coreg, Losartan, Metformin XR, Invokana, Aldactone, Neurontin, Metamucil and Zyrtec daily.    The history is provided by the patient.   Past Medical History:  Diagnosis Date   Adhesive capsulitis of shoulder    Allergy     Borderline personality disorder (HCC)    Carpal tunnel syndrome    COPD (chronic obstructive pulmonary disease) (HCC)    Depression    GERD (gastroesophageal reflux disease)    Hypertension    Nephrolithiasis    Type 2 diabetes mellitus (HCC)     Patient Active Problem List   Diagnosis Date Noted   Flank pain 01/20/2021   Vaginitis 01/20/2021   Neuropathic pain 03/16/2019   Type 2 diabetes mellitus with neurological complications (HCC) 11/15/2012   Metabolic syndrome 08/19/2011   Carpal tunnel syndrome 06/10/2010   Osteoarthritis of multiple joints 07/30/2008   Adhesive capsulitis of shoulder 01/15/2008   Obesity 07/25/2007   Major depressive disorder, recurrent episode (HCC) 08/04/2006   Primary hypertension 08/04/2006   Irritable bowel syndrome 08/04/2006    Past Surgical History:  Procedure Laterality Date   CHOLECYSTECTOMY     TONSILLECTOMY      OB History   No obstetric history on file.      Home Medications    Prior to Admission medications   Medication Sig Start Date End Date Taking? Authorizing Provider  amLODipine (NORVASC) 10 MG tablet Take 1 tablet (10 mg total) by mouth at bedtime. 11/26/20  Yes Westley Chandler, MD  canagliflozin (INVOKANA) 300 MG TABS tablet Take 300 mg by mouth daily.   Yes [provider]  carvedilol (COREG) 25 MG tablet TAKE 1 Tablet  BY MOUTH TWICE DAILY WITH A MEAL 12/09/20  Yes Westley Chandler, MD  cetirizine (ZYRTEC) 10 MG tablet Take 10 mg by mouth daily.   Yes [provider]  gabapentin (NEURONTIN) 300 MG capsule Take 2 capsules (600 mg total) by mouth at bedtime. 08/01/20  Yes Westley Chandler, MD  itraconazole (SPORANOX) 100 MG capsule Take 1 capsule (100 mg total) by mouth daily for 10 days. 02/04/21 02/14/21 Yes Tasmia Blumer, Ali Lowe, NP  losartan (COZAAR) 100 MG tablet Take 1 tablet (100 mg total) by mouth at bedtime. 09/17/20  Yes Westley Chandler, MD  metFORMIN (GLUCOPHAGE-XR) 500 MG 24 hr tablet TAKE 2 TABLETS BY  MOUTH TWICE DAILY 09/17/20  Yes Westley Chandler, MD  psyllium (METAMUCIL) 58.6 % packet Take 1 packet by mouth daily.   Yes [provider]  spironolactone (ALDACTONE) 25 MG tablet Take 1 tablet (25 mg total) by mouth daily. 08/01/20  Yes Westley Chandler, MD  venlafaxine (EFFEXOR) 75 MG tablet Take 1 tablet (75 mg total) by mouth 2 (two) times daily. 03/15/11 08/19/11  Reva Bores, MD    Family History Family History  Problem Relation Age of Onset   Diabetes Mother    Hypertension Mother    Alzheimer's disease Father    Dementia Father     Social History Social History   Tobacco Use   Smoking status: Former    Packs/day: 0.25    Years: 30.00    Pack years: 7.50    Types: Cigarettes    Quit date: 08/13/2007    Years since quitting: 13.4   Smokeless tobacco: Never   Tobacco comments:    boyfriend smokes  Vaping Use   Vaping Use: Never used  Substance Use Topics   Alcohol use: No    Comment: rarely   Drug use: No     Allergies   Ace inhibitors and Metformin and related   Review of Systems Review of Systems  Constitutional:  Negative for appetite change, chills, fatigue and fever.  Respiratory:  Negative for chest tightness and shortness of breath.   Gastrointestinal:  Negative for blood in stool, nausea and vomiting. Abdominal pain: left side. Diarrhea: looser stools. Genitourinary:  Positive for flank pain, frequency, urgency and vaginal pain. Negative for difficulty urinating, dysuria, hematuria, pelvic pain, vaginal bleeding and vaginal discharge (but odor).  Musculoskeletal:  Positive for arthralgias and back pain.  Skin:  Negative for color change and rash.  Allergic/Immunologic: Positive for environmental allergies. Negative for food allergies.  Neurological:  Negative for dizziness, tremors, seizures, syncope and light-headedness.  Hematological:  Negative for adenopathy. Does not bruise/bleed easily.    Physical Exam Triage Vital Signs ED Triage  Vitals  Enc Vitals Group     BP 02/04/21 1228 (!) 162/98     Pulse Rate 02/04/21 1228 79     Resp 02/04/21 1228 18     Temp 02/04/21 1228 97.9 F (36.6 C)     Temp Source 02/04/21 1228 Oral     SpO2 02/04/21 1228 97 %     Weight --      Height --      Head Circumference --      Peak Flow --      Pain Score 02/04/21 1233 2     Pain Loc --      Pain Edu? --      Excl. in GC? --    No data found.  Updated Vital Signs BP (!) 162/98 (BP Location: Right Arm)   Pulse 79  Temp 97.9 F (36.6 C) (Oral)   Resp 18   LMP 07/15/2015 (Exact Date)   SpO2 97%   Visual Acuity Right Eye Distance:   Left Eye Distance:   Bilateral Distance:    Right Eye Near:   Left Eye Near:    Bilateral Near:     Physical Exam Vitals and nursing note reviewed.  Constitutional:      General: She is awake. She is not in acute distress.    Appearance: She is well-developed and well-groomed.     Comments: She is sitting on the exam table in no acute distress.   HENT:     Head: Normocephalic and atraumatic.     Right Ear: Hearing normal.     Left Ear: Hearing normal.  Eyes:     Extraocular Movements: Extraocular movements intact.     Conjunctiva/sclera: Conjunctivae normal.  Cardiovascular:     Rate and Rhythm: Normal rate and regular rhythm.     Heart sounds: Normal heart sounds. No murmur heard. Pulmonary:     Effort: Pulmonary effort is normal. No respiratory distress.     Breath sounds: Normal breath sounds and air entry. No decreased air movement. No wheezing, rhonchi or rales.  Abdominal:     General: Bowel sounds are normal. There is no distension.     Palpations: Abdomen is soft. There is no hepatomegaly or splenomegaly.     Tenderness: There is generalized abdominal tenderness and tenderness in the suprapubic area and left upper quadrant. There is no right CVA tenderness, left CVA tenderness, guarding or rebound.  Genitourinary:    Comments: No pelvic exam performed- patient preferred  to obtain vaginal self-swab for testing.  Musculoskeletal:        General: Normal range of motion.     Cervical back: Normal range of motion.  Skin:    General: Skin is warm and dry.     Capillary Refill: Capillary refill takes less than 2 seconds.     Findings: No rash.  Neurological:     General: No focal deficit present.     Mental Status: She is alert and oriented to person, place, and time.  Psychiatric:        Attention and Perception: Attention normal.        Mood and Affect: Mood normal.        Speech: Speech normal.        Behavior: Behavior is cooperative.        Thought Content: Thought content normal.     Comments: Patient had a difficult time keeping focused on history and events related to current visit.      UC Treatments / Results  Labs (all labs ordered are listed, but only abnormal results are displayed) Labs Reviewed  POCT URINALYSIS DIPSTICK, ED / UC - Abnormal; Notable for the following components:      Result Value   Glucose, UA >=1000 (*)    Hgb urine dipstick TRACE (*)    Protein, ur >=300 (*)    All other components within normal limits  URINE CULTURE  CERVICOVAGINAL ANCILLARY ONLY    EKG   Radiology No results found.  Procedures Procedures (including critical care time)  Medications Ordered in UC Medications - No data to display  Initial Impression / Assessment and Plan / UC Course  I have reviewed the triage vital signs and the nursing notes.  Pertinent labs & imaging results that were available during my care of the patient were reviewed  by me and considered in my medical decision making (see chart for details).     Did not repeat blood glucose level since patient had already evaluated at home this AM and was above 200. Reviewed urinalysis results with patient- glucose over 1000 indicating poor glycemic control. No ketones. Blood still present but trace compared to larger amount in previous UA with PCP. Protein present- uncertain if  due to kidney stone, infection or elevated blood pressure. Will send urine for culture to confirm no UTI. Sent vaginal swab for testing for yeast and BV. Since oral Diflucan has not been helping, will switch to another oral antifungal. May trial Itraconazole 200mg  daily for 10 days per Up to Date recommendations for complicated and recurrent yeast vaginitis. Patient has no insurance and indicated that she may not be able to afford medication. If she can not afford it, recommend OTC Monistat or Gyne-Lotrimin as directed until lab results are available. Continue to push water and fluids. Recommend take your diabetic medications as directed and try to eat foods with lower sugar to help decrease your blood sugar which should decrease your chance of yeast vaginitis recurrence. Follow-up pending urine culture and vaginal swab testing as well as with her Urologist and PCP as planned.   Final Clinical Impressions(s) / UC Diagnoses   Final diagnoses:  Vaginal odor  Vaginal irritation  Other microscopic hematuria  History of renal calculi  DM type 2, uncontrolled, with renal complications (HCC)  Elevated blood pressure reading in office with diagnosis of hypertension     Discharge Instructions      Recommend start Itraconazole 100mg  daily for 10 days. If you are unable to afford this medication, then you should trial OTC anti-fungal medication such as Monistat or Gyne-Lotrimin as directed. Continue to push fluids. Since your sugars are high, please take your diabetic medication as directed. Recommend follow-up pending urine culture and vaginal swab testing as well as with your Urologist and PCP as planned.      ED Prescriptions     Medication Sig Dispense Auth. Provider   itraconazole (SPORANOX) 100 MG capsule Take 1 capsule (100 mg total) by mouth daily for 10 days. 10 capsule Alysabeth Scalia, , NP      PDMP not reviewed this encounter.   , NP 02/05/21 [phone]

## 2021-02-16 ENCOUNTER — Other Ambulatory Visit: Payer: Self-pay

## 2021-02-16 ENCOUNTER — Encounter: Payer: Self-pay | Admitting: Family Medicine

## 2021-02-16 ENCOUNTER — Ambulatory Visit (INDEPENDENT_AMBULATORY_CARE_PROVIDER_SITE_OTHER): Payer: Self-pay | Admitting: Family Medicine

## 2021-02-16 VITALS — BP 140/80 | HR 76 | Ht 63.0 in | Wt 188.8 lb

## 2021-02-16 DIAGNOSIS — Z789 Other specified health status: Secondary | ICD-10-CM

## 2021-02-16 DIAGNOSIS — N369 Urethral disorder, unspecified: Secondary | ICD-10-CM

## 2021-02-16 DIAGNOSIS — I10 Essential (primary) hypertension: Secondary | ICD-10-CM

## 2021-02-16 DIAGNOSIS — E119 Type 2 diabetes mellitus without complications: Secondary | ICD-10-CM

## 2021-02-16 DIAGNOSIS — N289 Disorder of kidney and ureter, unspecified: Secondary | ICD-10-CM

## 2021-02-16 DIAGNOSIS — E1149 Type 2 diabetes mellitus with other diabetic neurological complication: Secondary | ICD-10-CM

## 2021-02-16 DIAGNOSIS — D72829 Elevated white blood cell count, unspecified: Secondary | ICD-10-CM

## 2021-02-16 LAB — POCT GLYCOSYLATED HEMOGLOBIN (HGB A1C): HbA1c, POC (controlled diabetic range): 7.4 % — AB (ref 0.0–7.0)

## 2021-02-16 MED ORDER — SPIRONOLACTONE 25 MG PO TABS: 25.0000 mg | ORAL_TABLET | Freq: Every day | ORAL | 3 refills | Status: DC

## 2021-02-16 MED ORDER — GABAPENTIN 300 MG PO CAPS: 600.0000 mg | ORAL_CAPSULE | Freq: Every day | ORAL | 3 refills | Status: DC

## 2021-02-16 NOTE — Assessment & Plan Note (Signed)
At goal but patient reports significant pill burden.  Will reach out to pharmacy team to see if there is a combination of olmesartan and amlodipine available through [facility].  This comes and formulation will be relatively equivalent to her current losartan and amlodipine dosing.

## 2021-02-16 NOTE — Progress Notes (Signed)
SUBJECTIVE:   CHIEF COMPLAINT: Pap smear and diabetes follow-up HPI:   Carmen [name] is a 60 y.o. yo with history notable for type 2 diabetes, hypertension and dyslipidemia presenting for routine follow-up for her diabetes..  The patient reports compliance with her medications.  Metformin does cause her GI upset at times.  She has had multiple episodes of yeast vaginitis.  She is okay with continuing her Invokana at this time.  She feels that this was probably due to what she was eating and this is now improved.  She is not interested in restarting a statin and would like to instead recheck her cholesterol today.  She has not been intolerant of Lipitor today and is not particular willing to try another 1.  The patient reports adherence to her antihypertensive therapy.  She does report that she is tired of taking so many pills.  She denies chest pain, dyspnea, lower extremity edema.  The patient recently saw urology.  She was offered a ureteroscopy to look at her left ureteral lesion which is most likely 3 mm stone (possibly urothelial carcinoma which we discussed again today).  She declined this.  She also declines her Pap smear, colonoscopy and mammogram today.  She does not have insurance and reports these are too expensive.  Offered assistance through [facility] financial assistance and care management team.  She declined at this time and would like to think about it follow-up.  PERTINENT  PMH / PSH/Family/Social History : Updated and reviewed as appropriate.  Family history was discussed and she does have a aunt who had ovarian cancer.  OBJECTIVE:   BP 140/80   Pulse 76   Ht 5\' 3"  (1.6 m)   Wt 188 lb 12.8 oz (85.6 kg)   LMP 07/15/2015 (Exact Date)   SpO2 97%   BMI 33.44 kg/m   Today's weight:  Last Weight  Most recent update: 02/16/2021 11:37 AM    Weight  85.6 kg (188 lb 12.8 oz)            Review of prior weights: Filed Weights   02/16/21 1137  Weight: 188 lb 12.8 oz  (85.6 kg)   Pleasant appearing woman in no distress.  She is alert and oriented to person place and situation.  Regular rate and rhythm.  Lungs are clear.  No lower extremity edema.  Abdomen is soft no masses appreciated she does have some very mild left lower quadrant pain but no rebound or guarding.  ASSESSMENT/PLAN:   Primary hypertension At goal but patient reports significant pill burden.  Will reach out to pharmacy team to see if there is a combination of olmesartan and amlodipine available through [facility].  This comes and formulation will be relatively equivalent to her current losartan and amlodipine dosing. CMP for monitoring today.   Type 2 diabetes mellitus with neurological complications (HCC) Congratulated on her A1c.  She does have recurrent yeast vaginitis.  Could consider transitioning SGLT2 inhibitor to GLP-1 agonist in the future.  Patient amenable to continuing current regiment at this present time.  She has a history of intolerance of statins and is very hesitant to restart another 1.  She has tried only atorvastatin we discussed trialing either Crestor or Pravachol pending her cholesterol panel.  She is amenable to discussing.  Statin intolerance the patient has a history of myalgias to statins and is fearful of their side effects.  We discussed data supporting the fact that these medications are safe and effective at  prolonging her life, reducing the risk of heart attack and stroke.  She declined starting on today. Will continue to address    Leukocytosis, repeat CBC today.  Renal lesion, we reviewed the options including to call urology to schedule her ureteroscopy she will think about this.  Family history of ovarian cancer.  Offered and recommended genetic consultation.  She declined and would like to think about at this time.  HCM  Declined Pap, Flu and COVID vaccine. Would like to hold off on mammogram and colonoscopy--discussed risks with patient   We discussed  the importance of her Pap smear today.  She would like to consider this at follow-up and she will schedule a visit for this.    Terisa Starr, MD  Family Medicine Teaching Service  [facility]

## 2021-02-16 NOTE — Patient Instructions (Signed)
It was wonderful to see you today.  Please bring ALL of your medications with you to every visit.   Today we talked about:  -- Think about genetic testing  -- You can call [facility] about the test for your kidney   -- let's get blood work today and I call you with results   Thank you for choosing [facility].   Please call [phone] with any questions about today's appointment.  Please be sure to schedule follow up at the front  desk before you leave today.   Terisa Starr, MD  Family Medicine

## 2021-02-16 NOTE — Assessment & Plan Note (Signed)
Congratulated on her A1c.  She does have recurrent yeast vaginitis.  Could consider transitioning SGLT2 inhibitor to GLP-1 agonist in the future.  Patient amenable to continuing current regiment at this present time.  She has a history of intolerance of statins and is very hesitant to restart another 1.  She has tried only atorvastatin we discussed trialing either Crestor or Pravachol pending her cholesterol panel.  She is amenable to discussing.

## 2021-02-17 ENCOUNTER — Telehealth: Payer: Self-pay | Admitting: Family Medicine

## 2021-02-17 LAB — COMPREHENSIVE METABOLIC PANEL
ALT: 11 IU/L (ref 0–32)
AST: 10 IU/L (ref 0–40)
Albumin/Globulin Ratio: 2 (ref 1.2–2.2)
Albumin: 4.1 g/dL (ref 3.8–4.9)
Alkaline Phosphatase: 72 IU/L (ref 44–121)
BUN/Creatinine Ratio: 18 (ref 9–23)
BUN: 10 mg/dL (ref 6–24)
Bilirubin Total: 0.4 mg/dL (ref 0.0–1.2)
CO2: 26 mmol/L (ref 20–29)
Calcium: 9 mg/dL (ref 8.7–10.2)
Chloride: 102 mmol/L (ref 96–106)
Creatinine, Ser: 0.57 mg/dL (ref 0.57–1.00)
Globulin, Total: 2.1 g/dL (ref 1.5–4.5)
Glucose: 236 mg/dL — ABNORMAL HIGH (ref 65–99)
Potassium: 3.9 mmol/L (ref 3.5–5.2)
Sodium: 147 mmol/L — ABNORMAL HIGH (ref 134–144)
Total Protein: 6.2 g/dL (ref 6.0–8.5)
eGFR: 105 mL/min/{1.73_m2} (ref >59)

## 2021-02-17 LAB — CBC WITH DIFFERENTIAL/PLATELET
Basophils Absolute: 0.1 10*3/uL (ref 0.0–0.2)
Basos: 1 %
EOS (ABSOLUTE): 0.2 10*3/uL (ref 0.0–0.4)
Eos: 2 %
Hematocrit: 40.4 % (ref 34.0–46.6)
Hemoglobin: 14 g/dL (ref 11.1–15.9)
Immature Grans (Abs): 0 10*3/uL (ref 0.0–0.1)
Immature Granulocytes: 1 %
Lymphocytes Absolute: 2.3 10*3/uL (ref 0.7–3.1)
Lymphs: 28 %
MCH: 30.1 pg (ref 26.6–33.0)
MCHC: 34.7 g/dL (ref 31.5–35.7)
MCV: 87 fL (ref 79–97)
Monocytes Absolute: 0.5 10*3/uL (ref 0.1–0.9)
Monocytes: 6 %
Neutrophils Absolute: 5.2 10*3/uL (ref 1.4–7.0)
Neutrophils: 62 %
Platelets: 219 10*3/uL (ref 150–450)
RBC: 4.65 x10E6/uL (ref 3.77–5.28)
RDW: 12.5 % (ref 11.7–15.4)
WBC: 8.4 10*3/uL (ref 3.4–10.8)

## 2021-02-17 LAB — LIPID PANEL
Chol/HDL Ratio: 3.4 ratio (ref 0.0–4.4)
Cholesterol, Total: 202 mg/dL — ABNORMAL HIGH (ref 100–199)
HDL: 60 mg/dL (ref >39)
LDL Chol Calc (NIH): 105 mg/dL — ABNORMAL HIGH (ref 0–99)
Triglycerides: 220 mg/dL — ABNORMAL HIGH (ref 0–149)
VLDL Cholesterol Cal: 37 mg/dL (ref 5–40)

## 2021-02-17 NOTE — Telephone Encounter (Signed)
Called with results. ASCVD 7.7%. Recommended statin given history of DM. Patient wants repeat LDL at follow up.  CBC unremarkable. Repeat sodium at follow up  Discussed remainder of results. All questions answered.

## 2021-02-24 ENCOUNTER — Ambulatory Visit (HOSPITAL_COMMUNITY)
Admission: EM | Admit: 2021-02-24 | Discharge: 2021-02-24 | Disposition: A | Discharge status: Home / Self Care | Payer: Self-pay | Attending: Student | Admitting: Student

## 2021-02-24 ENCOUNTER — Encounter (HOSPITAL_COMMUNITY): Payer: Self-pay

## 2021-02-24 DIAGNOSIS — E1169 Type 2 diabetes mellitus with other specified complication: Secondary | ICD-10-CM | POA: Insufficient documentation

## 2021-02-24 DIAGNOSIS — B3731 Acute candidiasis of vulva and vagina: Secondary | ICD-10-CM

## 2021-02-24 DIAGNOSIS — B373 Candidiasis of vulva and vagina: Secondary | ICD-10-CM | POA: Insufficient documentation

## 2021-02-24 LAB — POCT URINALYSIS DIPSTICK, ED / UC
Bilirubin Urine: NEGATIVE
Glucose, UA: >=1000 mg/dL — AB
Hgb urine dipstick: NEGATIVE
Ketones, ur: NEGATIVE mg/dL
Leukocytes,Ua: NEGATIVE
Nitrite: NEGATIVE
Protein, ur: 100 mg/dL — AB
Specific Gravity, Urine: 1.01 (ref 1.005–1.030)
Urobilinogen, UA: 0.2 mg/dL (ref 0.0–1.0)
pH: 5.5 (ref 5.0–8.0)

## 2021-02-24 MED ORDER — FLUCONAZOLE 150 MG PO TABS: 150.0000 mg | ORAL_TABLET | Freq: Every day | ORAL | 0 refills | Status: DC

## 2021-02-24 NOTE — ED Provider Notes (Signed)
[facility]    CSN: [id number] Arrival date & time: 02/24/21  1027      History   Chief Complaint No chief complaint on file.   HPI Carmen [name] is a 60 y.o. female presenting with possible yeast infection. Followed by urology for L ureteral lesion. Recurrent vaginal candida given Invokana use.  Was actually seen by her PCP on 9/12 for vaginal irritation, at that time they discussed symptoms and plan to continue the Invokana. Symptoms improved after last round of diflucan 3 weeks ago. Again with white vaginal discharge with external vaginal itching. Denies new partners. Denies hematuria, dysuria, frequency, urgency, back pain, n/v/d/abd pain, fevers/chills, abdnormal vaginal rashes.    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      Home Medications    Prior to Admission medications   Medication Sig Start Date End Date Taking? Authorizing Provider  fluconazole (DIFLUCAN) 150 MG tablet Take 1 tablet (150 mg total) by mouth daily. -For your yeast  infection, start the Diflucan (fluconazole)- Take one pill today (day 1). If you're still having symptoms in 3 days, take the second pill. 02/24/21  Yes Rhys Martini, PA-C  amLODipine (NORVASC) 10 MG tablet Take 1 tablet (10 mg total) by mouth at bedtime. 11/26/20   Westley Chandler, MD  canagliflozin (INVOKANA) 300 MG TABS tablet Take 300 mg by mouth daily.    [provider]  carvedilol (COREG) 25 MG tablet TAKE 1 Tablet  BY MOUTH TWICE DAILY WITH A MEAL 12/09/20   Westley Chandler, MD  cetirizine (ZYRTEC) 10 MG tablet Take 10 mg by mouth daily.    [provider]  gabapentin (NEURONTIN) 300 MG capsule Take 2 capsules (600 mg total) by mouth at bedtime. 02/16/21   Westley Chandler, MD  losartan (COZAAR) 100 MG tablet Take 1 tablet (100 mg total) by mouth at bedtime. 09/17/20   Westley Chandler, MD  metFORMIN (GLUCOPHAGE-XR) 500 MG 24 hr tablet TAKE 2 TABLETS BY MOUTH TWICE DAILY 09/17/20   Westley Chandler, MD  psyllium (METAMUCIL) 58.6 % packet Take 1 packet by mouth daily.    [provider]  spironolactone (ALDACTONE) 25 MG tablet Take 1 tablet (25 mg total) by mouth daily. 02/16/21   Westley Chandler, MD  venlafaxine (EFFEXOR) 75 MG tablet Take 1 tablet (75 mg total) by mouth 2 (two) times daily. 03/15/11 08/19/11  Reva Bores, MD    Family History Family History  Problem Relation Age  of Onset   Diabetes Mother    Hypertension Mother    Alzheimer's disease Father    Dementia Father    Ovarian cancer Other        aunt uncertain age    Social History Social History   Tobacco Use   Smoking status: Former    Packs/day: 0.25    Years: 30.00    Pack years: 7.50    Types: Cigarettes    Quit date: 08/13/2007    Years since quitting: 13.5   Smokeless tobacco: Never   Tobacco comments:    boyfriend smokes  Vaping Use   Vaping Use: Never used  Substance Use Topics   Alcohol use: No    Comment: rarely   Drug use: No     Allergies   Ace inhibitors and Metformin  and related   Review of Systems Review of Systems  Constitutional:  Negative for chills and fever.  HENT:  Negative for sore throat.   Eyes:  Negative for pain and redness.  Respiratory:  Negative for shortness of breath.   Cardiovascular:  Negative for chest pain.  Gastrointestinal:  Negative for abdominal pain, diarrhea, nausea and vomiting.  Genitourinary:  Positive for vaginal discharge. Negative for decreased urine volume, difficulty urinating, dysuria, flank pain, frequency, genital sores, hematuria, urgency, vaginal bleeding and vaginal pain.  Musculoskeletal:  Negative for back pain.  Skin:  Negative for rash.  All other systems reviewed and are negative.   Physical Exam Triage Vital Signs ED Triage Vitals  Enc Vitals Group     BP      Pulse      Resp      Temp      Temp src      SpO2      Weight      Height      Head Circumference      Peak Flow      Pain Score      Pain Loc      Pain Edu?      Excl. in GC?    No data found.  Updated Vital Signs BP (!) 154/80   Pulse 72   Temp 98.2 F (36.8 C) (Oral)   Resp 17   LMP 07/15/2015 (Exact Date)   SpO2 97%   Visual Acuity Right Eye Distance:   Left Eye Distance:   Bilateral Distance:    Right Eye Near:   Left Eye Near:    Bilateral Near:     Physical Exam Vitals reviewed.  Constitutional:      General: She is not in acute distress.    Appearance: Normal appearance. She is not ill-appearing.  HENT:     Head: Normocephalic and atraumatic.     Mouth/Throat:     Mouth: Mucous membranes are moist.     Comments: Moist mucous membranes Eyes:     Extraocular Movements: Extraocular movements intact.     Pupils: Pupils are equal, round, and reactive to light.  Cardiovascular:     Rate and Rhythm: Normal rate and regular rhythm.     Heart sounds: Normal heart sounds.  Pulmonary:     Effort: Pulmonary effort is normal.     Breath sounds: Normal breath sounds. No wheezing, rhonchi or rales.  Abdominal:      General: Bowel sounds are normal. There is no distension.     Palpations: Abdomen is soft. There is no mass.     Tenderness: There is no abdominal tenderness.  There is no right CVA tenderness, left CVA tenderness, guarding or rebound.  Genitourinary:    Comments: deferred Skin:    General: Skin is warm.     Capillary Refill: Capillary refill takes less than 2 seconds.     Comments: Good skin turgor  Neurological:     General: No focal deficit present.     Mental Status: She is alert and oriented to person, place, and time.  Psychiatric:        Mood and Affect: Mood normal.        Behavior: Behavior normal.     UC Treatments / Results  Labs (all labs ordered are listed, but only abnormal results are displayed) Labs Reviewed  POCT URINALYSIS DIPSTICK, ED / UC - Abnormal; Notable for the following components:      Result Value   Glucose, UA >=1000 (*)    Protein, ur 100 (*)    All other components within normal limits  CERVICOVAGINAL ANCILLARY ONLY    EKG   Radiology No results found.  Procedures Procedures (including critical care time)  Medications Ordered in UC Medications - No data to display  Initial Impression / Assessment and Plan / UC Course  I have reviewed the triage vital signs and the nursing notes.  Pertinent labs & imaging results that were available during my care of the patient were reviewed by me and considered in my medical decision making (see chart for details).     This patient is a very pleasant 60 y.o. year old female presenting with recurrent vaginal candidiasis. Afebrile, nontachycardic, no reproducible abd pain or CVAT.   Positive for candida 8/31, symptoms improved but not resolved after diflucan.  Recurrent vaginal candidiasis given GLP-1. Followed by PCP for this.  Denies STI risk. Will send self-swab for G/C, trich, yeast, BV testing. . Safe sex precautions.   Diflucan sent. F/w with PCP as this is a chronic issue.  ED return  precautions discussed. Patient verbalizes understanding and agreement.   Coding Level 4 for review of past notes/labs, order and interpretation of labs today, and prescription drug management  Final Clinical Impressions(s) / UC Diagnoses   Final diagnoses:  Recurrent candidiasis of vagina     Discharge Instructions      -For your yeast infection, start the Diflucan (fluconazole)- Take one pill today (day 1). If you're still having symptoms in 3 days, take the second pill.  -Follow-up with PCP given recurrent yeast infections and kidney issue     ED Prescriptions     Medication Sig Dispense Auth. Provider   fluconazole (DIFLUCAN) 150 MG tablet Take 1 tablet (150 mg total) by mouth daily. -For your yeast infection, start the Diflucan (fluconazole)- Take one pill today (day 1). If you're still having symptoms in 3 days, take the second pill. 2 tablet Rhys Martini, PA-C      PDMP not reviewed this encounter.   Rhys Martini, PA-C 02/24/21 1327

## 2021-02-24 NOTE — Discharge Instructions (Addendum)
-  For your yeast infection, start the Diflucan (fluconazole)- Take one pill today (day 1). If you're still having symptoms in 3 days, take the second pill.  -Follow-up with PCP given recurrent yeast infections and kidney issue

## 2021-02-24 NOTE — ED Triage Notes (Signed)
Pt reports she is having a unresolved yeast infection x 3-4 weeks and burning when urinating.

## 2021-02-25 ENCOUNTER — Encounter: Payer: Self-pay | Admitting: Family Medicine

## 2021-02-25 LAB — CERVICOVAGINAL ANCILLARY ONLY
Bacterial Vaginitis (gardnerella): NEGATIVE
Candida Glabrata: POSITIVE — AB
Candida Vaginitis: NEGATIVE
Chlamydia: NEGATIVE
Comment: NEGATIVE
Comment: NEGATIVE
Comment: NEGATIVE
Comment: NEGATIVE
Comment: NEGATIVE
Comment: NORMAL
Neisseria Gonorrhea: NEGATIVE
Trichomonas: NEGATIVE

## 2021-02-26 ENCOUNTER — Telehealth: Payer: Self-pay | Admitting: Family Medicine

## 2021-02-26 NOTE — Telephone Encounter (Addendum)
Called patient to discuss. Recommend discontinuing SGLT2 inhibitor until BG better controlled and yeast vaginitis (more than 5X at this point) resolved.  Given uncontrolled BG and recurrent yeast vaginitis, will schedule with Dr. Nicholaus Bloom.   Call interrupted--will call patient back as able.  2:40 PM--Called patient back. Scheduled with Dr. Nicholaus Bloom. She interested in discussing finerenone, oral semaglutide (currently has med assist).  All questions answered.  Terisa Starr, MD  Family Medicine Teaching Service

## 2021-03-04 ENCOUNTER — Other Ambulatory Visit (HOSPITAL_COMMUNITY)
Admission: RE | Admit: 2021-03-04 | Discharge: 2021-03-04 | Disposition: A | Discharge status: Home / Self Care | Payer: Self-pay | Source: Ambulatory Visit | Attending: Family Medicine | Admitting: Family Medicine

## 2021-03-04 ENCOUNTER — Ambulatory Visit (INDEPENDENT_AMBULATORY_CARE_PROVIDER_SITE_OTHER): Payer: Self-pay | Admitting: Family Medicine

## 2021-03-04 ENCOUNTER — Ambulatory Visit (INDEPENDENT_AMBULATORY_CARE_PROVIDER_SITE_OTHER): Payer: Self-pay | Admitting: Pharmacist

## 2021-03-04 ENCOUNTER — Other Ambulatory Visit: Payer: Self-pay

## 2021-03-04 ENCOUNTER — Encounter: Payer: Self-pay | Admitting: Family Medicine

## 2021-03-04 VITALS — BP 158/82 | Ht 63.0 in

## 2021-03-04 DIAGNOSIS — E1149 Type 2 diabetes mellitus with other diabetic neurological complication: Secondary | ICD-10-CM

## 2021-03-04 DIAGNOSIS — Z124 Encounter for screening for malignant neoplasm of cervix: Secondary | ICD-10-CM

## 2021-03-04 DIAGNOSIS — I10 Essential (primary) hypertension: Secondary | ICD-10-CM

## 2021-03-04 DIAGNOSIS — B379 Candidiasis, unspecified: Secondary | ICD-10-CM

## 2021-03-04 DIAGNOSIS — Z0000 Encounter for general adult medical examination without abnormal findings: Secondary | ICD-10-CM

## 2021-03-04 LAB — POCT WET PREP (WET MOUNT)
Clue Cells Wet Prep Whiff POC: NEGATIVE
Trichomonas Wet Prep HPF POC: ABSENT

## 2021-03-04 NOTE — Patient Instructions (Signed)
Carmen [name] it was a pleasure seeing you today.   Please do the following:  Continue taking your medicines as directed today during your appointment. If you have any questions or if you believe something has occurred because of this change, call me or your doctor to let one of [place] know.  Continue checking blood sugars at home. It's really important that you record these and bring these in to your next doctor's appointment.  Continue making the lifestyle changes we've discussed together during our visit. Diet and exercise play a significant role in improving your blood sugars.  Follow-up with Dr. Manson Passey on 03/20/21.    Hypoglycemia or low blood sugar:   Low blood sugar can happen quickly and may become an emergency if not treated right away.   While this shouldn't happen often, it can be brought upon if you skip a meal or do not eat enough. Also, if your insulin or other diabetes medications are dosed too high, this can cause your blood sugar to go to low.   Warning signs of low blood sugar include: Feeling shaky or dizzy Feeling weak or tired  Excessive hunger Feeling anxious or upset  Sweating even when you aren't exercising  What to do if I experience low blood sugar? Follow the Rule of 15 Check your blood sugar with your meter. If lower than 70, proceed to step 2.  Treat with 15 grams of fast acting carbs which is found in 3-4 glucose tablets. If none are available you can try hard candy, 1 tablespoon of sugar or honey,4 ounces of fruit juice, or 6 ounces of REGULAR soda.  Re-check your sugar in 15 minutes. If it is still below 70, do what you did in step 2 again. If your blood sugar has come back up, go ahead and eat a snack or small meal made up of complex carbs (ex. Whole grains) and protein at this time to avoid recurrence of low blood sugar.

## 2021-03-04 NOTE — Progress Notes (Addendum)
     SUBJECTIVE:   CHIEF COMPLAINT / HPI:   Carmen [name] is a 60 y.o. female presents for yeast infection   Recurrent yeast infection  Pt reports 6 x yeast infections in the last few months. Her main symptom is feeling 'wet' on her underwear and smelly vaginal odors. Denies white discharge or vaginal pruritus. She was recently treated with diflucan. Dr Manson Passey took her off Invokana last week due to recurrent infections. For her diabetes she is on Metformin only. She has seen pharmacy team for further diabetes management.   Flowsheet Row Office Visit from 02/16/2021 in [facility]  PHQ-9 Total Score 4      PERTINENT  PMH / PSH: DM, HTN   OBJECTIVE:   BP (!) 158/82   Ht 5\' 3"  (1.6 m)   LMP 07/15/2015 (Exact Date)   BMI 33.44 kg/m    General: Alert, no acute distress Cardio: well perfused Pulm: normal work of breathing  Neuro: Cranial nerves grossly intact   Pelvic Exam chaperoned by CMA Jazmin        External: normal female genitalia without lesions or masses        Vagina: normal without lesions or masses        Cervix: normal without lesions or masses        Pap smear: performed        Samples for Wet prep, GC/Chlamydia obtained   ASSESSMENT/PLAN:   Candida glabrata infection Wet prep from today wnl however recent wet prep from 02/24/21 shows candida glabrata infection, this likely explains pt's resistance to treatment with azoles. Precepted pt with Dr 02/26/21 who recommended boric acid vaginal suppositories for the next 2-3 weeks. Follow up with PCP if no improvement in sx can consider flucytosine/amphotericine vaginal cream in combination with boric acid suppositories.  Health maintenance examination Obtained pap smear.    Pollie Meyer, MD PGY-3 [facility]

## 2021-03-04 NOTE — Patient Instructions (Signed)
Thank you for coming to see me today. It was a pleasure. Today we discussed your recurrent yeast infections. It is due to an infection called CANDIDA GLABRATA. This is often resistant to normal yeast medications. I recommend buying boric acid 600mg  suppositories. Insert this into your vagina once daily for 2-3 weeks. PLEASE DO NOT EAT THIS PILL, IT CAN CAUSE DEATH.  Please follow-up with PCP if you still have symptoms.   If you have any questions or concerns, please do not hesitate to call the office at [phone].  Best wishes,   Dr [phone]

## 2021-03-04 NOTE — Progress Notes (Signed)
Subjective:    Patient ID: Carmen [name], female    DOB: [birth year], 60 y.o.   MRN: [id number]  HPI Patient is a 60 y.o. female who presents for diabetes management. She reports being in poor spirits due to her current health situation, and family situation (son was in car accident last night) and presents without assistance. Patient was referred and last seen by Primary Care Provider on 02/16/2021  Patient has had recurrent yeast infections for the past year and she was recently stopped on her Invokana due to this. She also reports ongoing stomach upset which she attributes to the yeast infections. She also reports that she was found to have a 3 mm kidney stone that is "growing renal cell carcinoma" for which she has an appointment on 10/13 with a urologist. She is worried that this has happened due to long-term use of metformin. She would like to not keep taking it. She also is adamant that she would not like to be started on insulin ever. She reports not checking her blood glucose readings at home.  She reports being overall frustrated by the amount of pills she takes. She would like to decrease the amount of pills she takes. Her goal is to potentially be off the spironolactone and she would like recommendations on what she could take instead.   She reports living with her mom and having a poor diet. Her mom buys many pastries and likes to bake. She reports unintentional weight loss of 35 lbs over the last year. She reports taking magnesium citrate off and on for her ongoing constipation.  PMH significant for HTN, IBS, obesity, neuropathic pain, MDD.   Insurance coverage/medication affordability: uninsured, uses [facility] or [employer]  Current diabetes medications include: metformin 500 mg daily Current hypertension medications include: amlodipine 10 mg, carvedilol 25 mg, spironolactone 25 mg, losartan 100 mg   Patient states that She is taking her medications as prescribed. Patient  reports adherence with medications.   Patient denies hypoglycemic events.  Patient denies polyuria (increased urination). No - mostly from yeast infection. Urine has been foamy Patient denies polyphagia (increased appetite). no Patient denies polydipsia (increased thirst). no Patient reports neuropathy (nerve pain). Yes  Objective:   Labs:   Physical Exam Neurological:     Mental Status: She is alert and oriented to person, place, and time.     Lab Results  Component Value Date   HGBA1C 7.4 (A) 02/16/2021   HGBA1C 8.2 (A) 11/24/2020   HGBA1C 7.6 (A) 08/01/2020    There were no vitals filed for this visit.  Lab Results  Component Value Date   MICRALBCREAT >300 01/29/2019    Lipid Panel     Component Value Date/Time   CHOL 202 (H) 02/16/2021 1218   TRIG 220 (H) 02/16/2021 1218   HDL 60 02/16/2021 1218   CHOLHDL 3.4 02/16/2021 1218   CHOLHDL 3.7 11/12/2015 1554   VLDL 71 (H) 11/12/2015 1554   LDLCALC 105 (H) 02/16/2021 1218    Clinical Atherosclerotic Cardiovascular Disease (ASCVD):  The 10-year ASCVD risk score (Arnett DK, et al., 2019) is: 9.3%   Values used to calculate the score:     Age: 60 years     Sex: Female     Is Non-Hispanic African American: No     Diabetic: Yes     Tobacco smoker: No     Systolic Blood Pressure: 154 mmHg     Is BP treated: Yes     HDL  Cholesterol: 60 mg/dL     Total Cholesterol: 202 mg/dL   Assessment/Plan:   F8BO is controlled based on recent A1C,  but patient has not been checking blood glucose since discontinuing Invokana. Medication adherence appears optimal. Additional pharmacotherapy is not warranted due to ongoing situations, but in the future patient willing to consider addition of Rybelsus.  Will continue current medications. Following instruction patient verbalized understanding of treatment plan.    Continued metformin 500 mg BID Extensively discussed pathophysiology of diabetes, dietary effects on blood sugar  control, and recommended lifestyle interventions. Discussed with patient need to check fasting blood glucose daily Next A1C anticipated 05/2021.   Hypertension longstanding and currently unontrolled.  Blood pressure goal < 130/80 mmHg. Medication adherence appears optimal.  Blood pressure control is suboptimal due to needing additional therapy. However patient not wanting to take multiple pills. Could potentially think to switch losartan and spironolactone to a losartan/HCTZ combination pill to lower pill burden. However, current [place] [facility] formulary and [employer] $4 list have lower dose which would mean patient needs to take the same amount of pills. Patient would like to remain on current therapy.   Follow-up appointment 03/20/21 with PCP. Patient to follow-up with pharmacy clinic   This appointment required 50 minutes of direct patient care.  Thank you for involving pharmacy to assist in providing this patient's care.  Patient seen with Shellia Carwin, PharmD Candidate and Arnette Felts, PharmD - PGY1 Pharmacy Resident

## 2021-03-05 ENCOUNTER — Encounter: Payer: Self-pay | Admitting: Family Medicine

## 2021-03-05 LAB — CYTOLOGY - PAP
Chlamydia: NEGATIVE
Comment: NEGATIVE
Comment: NEGATIVE
Comment: NORMAL
Diagnosis: NEGATIVE
High risk HPV: NEGATIVE
Neisseria Gonorrhea: NEGATIVE

## 2021-03-05 NOTE — Assessment & Plan Note (Signed)
Hypertension longstanding and currently unontrolled.  Blood pressure goal < 130/80 mmHg. Medication adherence appears optimal.  Blood pressure control is suboptimal due to needing additional therapy. However patient not wanting to take multiple pills. Could potentially think to switch losartan and spironolactone to a losartan/HCTZ combination pill to lower pill burden. However, current [place] [facility] formulary and [facility] $4 list have lower dose which would mean patient needs to take the same amount of pills. Patient would like to remain on current therapy.

## 2021-03-05 NOTE — Assessment & Plan Note (Signed)
T2DM is controlled based on recent A1C,  but patient has not been checking blood glucose since discontinuing Invokana. Medication adherence appears optimal. Additional pharmacotherapy is not warranted due to ongoing situations, but in the future patient willing to consider addition of Rybelsus.  Will continue current medications. Following instruction patient verbalized understanding of treatment plan.    1. Continued metformin 500 mg BID 2. Extensively discussed pathophysiology of diabetes, dietary effects on blood sugar control, and recommended lifestyle interventions. 3. Discussed with patient need to check fasting blood glucose daily 4. Next A1C anticipated 05/2021.

## 2021-03-08 DIAGNOSIS — B379 Candidiasis, unspecified: Secondary | ICD-10-CM | POA: Insufficient documentation

## 2021-03-08 DIAGNOSIS — Z0000 Encounter for general adult medical examination without abnormal findings: Secondary | ICD-10-CM | POA: Insufficient documentation

## 2021-03-08 NOTE — Assessment & Plan Note (Signed)
Obtained pap smear 

## 2021-03-08 NOTE — Assessment & Plan Note (Addendum)
Wet prep from today wnl however recent wet prep from 02/24/21 shows candida glabrata infection, this likely explains pt's resistance to treatment with azoles. Precepted pt with Dr Pollie Meyer who recommended boric acid vaginal suppositories for the next 2-3 weeks. Follow up with PCP if no improvement in sx can consider flucytosine/amphotericine vaginal cream in combination with boric acid suppositories.

## 2021-03-20 ENCOUNTER — Ambulatory Visit (INDEPENDENT_AMBULATORY_CARE_PROVIDER_SITE_OTHER): Payer: Self-pay | Admitting: Family Medicine

## 2021-03-20 ENCOUNTER — Other Ambulatory Visit: Payer: Self-pay

## 2021-03-20 ENCOUNTER — Encounter: Payer: Self-pay | Admitting: Family Medicine

## 2021-03-20 VITALS — BP 156/83 | HR 81 | Wt 191.4 lb

## 2021-03-20 DIAGNOSIS — B379 Candidiasis, unspecified: Secondary | ICD-10-CM

## 2021-03-20 DIAGNOSIS — Z789 Other specified health status: Secondary | ICD-10-CM

## 2021-03-20 DIAGNOSIS — Z1231 Encounter for screening mammogram for malignant neoplasm of breast: Secondary | ICD-10-CM

## 2021-03-20 DIAGNOSIS — E1149 Type 2 diabetes mellitus with other diabetic neurological complication: Secondary | ICD-10-CM

## 2021-03-20 DIAGNOSIS — I10 Essential (primary) hypertension: Secondary | ICD-10-CM

## 2021-03-20 DIAGNOSIS — N289 Disorder of kidney and ureter, unspecified: Secondary | ICD-10-CM

## 2021-03-20 MED ORDER — SPIRONOLACTONE 50 MG PO TABS: 50.0000 mg | ORAL_TABLET | Freq: Every day | ORAL | 3 refills | Status: DC

## 2021-03-20 MED ORDER — SEMAGLUTIDE 3 MG PO TABS: 3.0000 mg | ORAL_TABLET | Freq: Every day | ORAL | 1 refills | Status: DC

## 2021-03-20 NOTE — Progress Notes (Signed)
SUBJECTIVE:   CHIEF COMPLAINT: renal lesion, DM  HPI:   Carmen [name] is a 60 y.o. yo with history notable for hypertension, type 2 diabetes, intolerance to statins (myalgias), and recurrent yeast vaginitis presenting for diabetes check and BP.  Diabetes Patient reports BG have been elevated in low 200s. Noted that dietary changes can really affect AM readings. No nausea, vomiting. No vaginal discharge (see below). She is interested in oral GLP1 agonist.   HTN Reports adherence to losartan, amlodipine, and carvedilol. Dislikes diuretic therapy due to frequency of voiding. She tolerates aldosterone antagonist. Denies headaches, blurry vision, chest pain. Most home readings in 160/80s per patient. She has not missed a dose in last week.  Vaginal Candid Patient being treated with boric acid for C. Glabrata. Reports marked improvement in symptoms. She has two more doses of therapy. She has stopped SGLT2 inhibitor.   Renal lesion Reports she saw Urology who now would like to repeat a CT scan. Denies flank pain or hematuria.    Current Outpatient Medications:    amLODipine (NORVASC) 10 MG tablet, Take 1 tablet (10 mg total) by mouth at bedtime., Disp: 90 tablet, Rfl: 3   carvedilol (COREG) 25 MG tablet, TAKE 1 Tablet  BY MOUTH TWICE DAILY WITH A MEAL, Disp: 180 tablet, Rfl: 3   gabapentin (NEURONTIN) 300 MG capsule, Take 2 capsules (600 mg total) by mouth at bedtime., Disp: 90 capsule, Rfl: 3   losartan (COZAAR) 100 MG tablet, Take 1 tablet (100 mg total) by mouth at bedtime., Disp: 90 tablet, Rfl: 3   metFORMIN (GLUCOPHAGE-XR) 500 MG 24 hr tablet, TAKE 2 TABLETS BY MOUTH TWICE DAILY, Disp: 180 tablet, Rfl: 5   Semaglutide 3 MG TABS, Take 3 mg by mouth daily. Take one tablet on empty stomach first thing in the AM, Disp: 30 tablet, Rfl: 1   fluconazole (DIFLUCAN) 150 MG tablet, Take 1 tablet (150 mg total) by mouth daily. -For your yeast infection, start the Diflucan (fluconazole)- Take  one pill today (day 1). If you're still having symptoms in 3 days, take the second pill. (Patient not taking: Reported on 03/20/2021), Disp: 2 tablet, Rfl: 0   spironolactone (ALDACTONE) 50 MG tablet, Take 1 tablet (50 mg total) by mouth daily., Disp: 90 tablet, Rfl: 3    PERTINENT  PMH / PSH/Family/Social History : Updated--follows with [facility] for [facility] lesion   OBJECTIVE:   BP (!) 156/83   Pulse 81   Wt 191 lb 6.4 oz (86.8 kg)   LMP 07/15/2015 (Exact Date)   SpO2 98%   BMI 33.90 kg/m   Today's weight:  Last Weight  Most recent update: 03/20/2021 11:09 AM    Weight  86.8 kg (191 lb 6.4 oz)            Review of prior weights: Filed Weights   03/20/21 1108  Weight: 191 lb 6.4 oz (86.8 kg)    Cardiac: Regular rate and rhythm. Normal S1/S2. No murmurs, rubs, or gallops appreciated. Lungs: Clear bilaterally to ascultation.  Psych: Pleasant and appropriate  Foot exam in flowsheet   ASSESSMENT/PLAN:   Primary hypertension Not at goal. Elevated at home. Increased spironolactone to 50 mg. Follow up with Dr. Nicholaus Bloom for recheck and repeat BMP.    Candida glabrata infection Has not yet completed boric acid therapy. Once completed, repeat pelvic exam at follow up with testing.   Type 2 diabetes mellitus with neurological complications (HCC) Given hyperglycemia and preferences, oral semaglutide to pharmacy.  Discussed side effects. Will message pharmacy team to see if additional forms needed as sent to [facility].  Foot exam completed today.    HCM Mammogram ordered Declined vaccines today     Terisa Starr, MD  Family Medicine Teaching Service  [facility]

## 2021-03-20 NOTE — Assessment & Plan Note (Signed)
Given hyperglycemia and preferences, oral semaglutide to pharmacy. Discussed side effects. Will message pharmacy team to see if additional forms needed as sent to [facility].  Foot exam completed today.

## 2021-03-20 NOTE — Assessment & Plan Note (Signed)
Has not yet completed boric acid therapy. Once completed, repeat pelvic exam at follow up with testing.

## 2021-03-20 NOTE — Assessment & Plan Note (Signed)
Not at goal. Elevated at home. Increased spironolactone to 50 mg. Follow up with Dr. Nicholaus Bloom for recheck and repeat BMP.

## 2021-03-20 NOTE — Patient Instructions (Addendum)
It was wonderful to see you today.  Please bring ALL of your medications with you to every visit.   Today we talked about:  - Please follow up with Dr. Nicholaus Bloom about your Rybelsus   - We will increase your Spironolactone  - Start oral Semaglutide--brand name is Rybelsus  --We will check your potassium and for yeast at follow up  I recommend you undergo a mammogram.   You can call to schedule an appointment by calling [phone].   Directions [address] [place], [place] [address]  Please let me know if you have questions. I will send you a letter or call you with results.      Thank you for choosing [facility].   Please call [phone] with any questions about today's appointment.  Please be sure to schedule follow up at the front  desk before you leave today.   Terisa Starr, MD  Family Medicine

## 2021-03-27 ENCOUNTER — Encounter: Payer: Self-pay | Admitting: Family Medicine

## 2021-03-27 ENCOUNTER — Other Ambulatory Visit: Payer: Self-pay | Admitting: Urology

## 2021-03-27 ENCOUNTER — Telehealth: Payer: Self-pay | Admitting: Family Medicine

## 2021-03-27 DIAGNOSIS — N201 Calculus of ureter: Secondary | ICD-10-CM

## 2021-03-27 NOTE — Telephone Encounter (Signed)
Called patient regarding multiple concerns and message.  If patient calls back please schedule her visit with me or another provider at her earliest convenience.

## 2021-03-30 ENCOUNTER — Ambulatory Visit (INDEPENDENT_AMBULATORY_CARE_PROVIDER_SITE_OTHER): Payer: Self-pay | Admitting: Pharmacist

## 2021-03-30 ENCOUNTER — Other Ambulatory Visit: Payer: Self-pay

## 2021-03-30 VITALS — BP 150/82

## 2021-03-30 DIAGNOSIS — I10 Essential (primary) hypertension: Secondary | ICD-10-CM

## 2021-03-30 NOTE — Patient Instructions (Signed)
Miss Perot it was a pleasure seeing you today.   Your blood pressure today continues to be elevated  Continue taking blood pressure medications as prescribed. Please consider adding on an additional blood pressure medication to help lower your blood pressure.  Limiting salt intake and caffeine.  Exercising as able for at least 30 minutes for 5 days out of the week, can also help you lower your blood pressure.  Take your blood pressure at home if you are able. Please write down these numbers and bring them to your visits.  If you have any questions please the office.  Follow-up with Dr. Manson Passey at your next schedule appointment.

## 2021-03-30 NOTE — Progress Notes (Signed)
Subjective:    Patient ID: Carmen [name], female    DOB: 28-Aug-1960, 60 y.o.   MRN: [id number]  HPI Patient is a 60 y.o. female who presents for hypertension management. She is in good spirits and presents without assistance. Patient was referred on 02/16/21 and last seen by Primary Care Provider on 03/20/21.  Hypertension ROS: taking medications as instructed, no medication side effects noted, home BP monitoring in range of 150-160's systolic over 80's diastolic, no chest pain on exertion, no dyspnea on exertion, and no swelling of ankles  New concerns: Patient reports issues with bowels and states sometimes it looks like "chopped spinach and other times it looks like I have orange foam nerf gun pellets." Patient also states she is stressed all of the time with her family situations and believes this is leading to her high blood pressure but states this stress will not go away so her blood pressure is always going to be elevated.  Current blood pressure medications: amlodipine 10mg , carvedilol 25mg , losartan 100mg , spironolactone 50mg   Dietary habits include: gets take-out around 1/2 the time and cooks the other 1/2  Objective:   Last 3 Office BP readings: BP Readings from Last 3 Encounters:  03/20/21 (!) 156/83  03/04/21 (!) 158/82  03/04/21 (!) 158/82    BMET    Component Value Date/Time   NA 147 (H) 02/16/2021 1218   K 3.9 02/16/2021 1218   CL 102 02/16/2021 1218   CO2 26 02/16/2021 1218   GLUCOSE 236 (H) 02/16/2021 1218   GLUCOSE 285 (H) 11/12/2015 1554   BUN 10 02/16/2021 1218   CREATININE 0.57 02/16/2021 1218   CREATININE 0.80 11/12/2015 1554   CALCIUM 9.0 02/16/2021 1218   GFRNONAA 104 03/07/2020 1120   GFRAA 120 03/07/2020 1120    Renal function: CrCl cannot be calculated (Patient's most recent lab result is older than the maximum 21 days allowed.).  Clinical ASCVD: No  The 10-year ASCVD risk score (Arnett DK, et al., 2019) is: 9.5%   Values used to  calculate the score:     Age: 78 years     Sex: Female     Is Non-Hispanic African American: No     Diabetic: Yes     Tobacco smoker: No     Systolic Blood Pressure: 156 mmHg     Is BP treated: Yes     HDL Cholesterol: 60 mg/dL     Total Cholesterol: 202 mg/dL  LMP 05/07/2020 (Exact Date)   Appearance alert, well appearing, oriented to person, place, and time, and overweight. General exam BP noted to be moderately elevated today in office.   Assessment/Plan:  Hypertension asymptomatic, poorly controlled, and needs improvement.  Reviewed diet, exercise and weight control. Recommended sodium restriction. Follow up: 3 weeks and as needed. Patient unwilling to add additional medication at this time as she states she is taking too many and wants "one pill for blood pressure and one pill for diabetes" despite discussing with patient at previous visit that this is not an option due to limitations with cost and therapy options .  -Counseled on lifestyle modifications for blood pressure control including reduced dietary sodium, increased exercise, adequate sleep.  Offered to have patient see provider earlier re message sent to Dr. 05/07/2020 on 03/27/21 but patient states she would like to keep appointment already scheduled in three weeks. She did state the resources provided to her for a mammogram were not accepting her due to patient being self-pay, but states  she will follow-up with Dr. Manson Passey regarding this at her appt.  Results reviewed and written information provided.   Total time in face-to-face counseling 60 minutes.   F/U Clinic Visit in three weeks with PCP.  Medication Samples have been provided to the patient.  Drug name: Rybelsus       Strength: 3mg         Qty: 30  LOT  Exp.Date: 06/06/21  Dosing instructions: take one tablet by mouth daily 30 with 4 oz of water 30 minutes before other medications and food  The patient has been instructed regarding the correct time, dose,  and frequency of taking this medication, including desired effects and most common side effects.   06/08/21 3:39 PM 03/30/2021

## 2021-03-31 ENCOUNTER — Other Ambulatory Visit: Payer: Self-pay

## 2021-03-31 DIAGNOSIS — E119 Type 2 diabetes mellitus without complications: Secondary | ICD-10-CM

## 2021-03-31 LAB — BASIC METABOLIC PANEL
BUN/Creatinine Ratio: 25 — ABNORMAL HIGH (ref 9–23)
BUN: 13 mg/dL (ref 6–24)
CO2: 27 mmol/L (ref 20–29)
Calcium: 8.2 mg/dL — ABNORMAL LOW (ref 8.7–10.2)
Chloride: 97 mmol/L (ref 96–106)
Creatinine, Ser: 0.53 mg/dL — ABNORMAL LOW (ref 0.57–1.00)
Glucose: 291 mg/dL — ABNORMAL HIGH (ref 70–99)
Potassium: 3.4 mmol/L — ABNORMAL LOW (ref 3.5–5.2)
Sodium: 138 mmol/L (ref 134–144)
eGFR: 107 mL/min/{1.73_m2} (ref >59)

## 2021-03-31 MED ORDER — GABAPENTIN 300 MG PO CAPS: 600.0000 mg | ORAL_CAPSULE | Freq: Every day | ORAL | 3 refills | Status: DC

## 2021-04-13 ENCOUNTER — Other Ambulatory Visit: Payer: Self-pay

## 2021-04-13 ENCOUNTER — Ambulatory Visit
Admission: RE | Admit: 2021-04-13 | Discharge: 2021-04-13 | Disposition: A | Discharge status: Home / Self Care | Payer: Self-pay | Source: Ambulatory Visit | Attending: Urology | Admitting: Urology

## 2021-04-13 DIAGNOSIS — N201 Calculus of ureter: Secondary | ICD-10-CM

## 2021-04-20 ENCOUNTER — Other Ambulatory Visit: Payer: Self-pay

## 2021-04-20 ENCOUNTER — Encounter: Payer: Self-pay | Admitting: Family Medicine

## 2021-04-20 ENCOUNTER — Ambulatory Visit (INDEPENDENT_AMBULATORY_CARE_PROVIDER_SITE_OTHER): Payer: Self-pay | Admitting: Family Medicine

## 2021-04-20 ENCOUNTER — Other Ambulatory Visit (HOSPITAL_COMMUNITY)
Admission: RE | Admit: 2021-04-20 | Discharge: 2021-04-20 | Disposition: A | Discharge status: Home / Self Care | Payer: Self-pay | Source: Ambulatory Visit | Attending: Family Medicine | Admitting: Family Medicine

## 2021-04-20 VITALS — BP 168/79 | HR 71 | Wt 192.0 lb

## 2021-04-20 DIAGNOSIS — I10 Essential (primary) hypertension: Secondary | ICD-10-CM

## 2021-04-20 DIAGNOSIS — N289 Disorder of kidney and ureter, unspecified: Secondary | ICD-10-CM

## 2021-04-20 DIAGNOSIS — E1149 Type 2 diabetes mellitus with other diabetic neurological complication: Secondary | ICD-10-CM

## 2021-04-20 DIAGNOSIS — B379 Candidiasis, unspecified: Secondary | ICD-10-CM | POA: Insufficient documentation

## 2021-04-20 DIAGNOSIS — T466X5A Adverse effect of antihyperlipidemic and antiarteriosclerotic drugs, initial encounter: Secondary | ICD-10-CM

## 2021-04-20 DIAGNOSIS — E041 Nontoxic single thyroid nodule: Secondary | ICD-10-CM

## 2021-04-20 DIAGNOSIS — M791 Myalgia, unspecified site: Secondary | ICD-10-CM

## 2021-04-20 MED ORDER — HYDROCHLOROTHIAZIDE 12.5 MG PO TABS: 12.5000 mg | ORAL_TABLET | Freq: Every day | ORAL | 3 refills | Status: DC

## 2021-04-20 NOTE — Assessment & Plan Note (Signed)
Follow-up testing for cure completed.

## 2021-04-20 NOTE — Assessment & Plan Note (Signed)
Not at goal discussed.  Added HCTZ.  Repeat metabolic panel in 2 weeks.  If she has hypokalemia we will simply add on the triamterene containing product.

## 2021-04-20 NOTE — Progress Notes (Signed)
SUBJECTIVE:   CHIEF COMPLAINT:throat issues and BP check  HPI:   Carmen [name] is a 60 y.o. yo with history notable for type 2 diabetes with an A1c of 7.4, hypertension, obesity and mood disorder presenting for routine follow-up.  Patient has several concerns today.  In terms of patient's diabetes.  She is thinking about watching what she is eating more.  She has tried Rybelsus but reports after taking it for 2 days she had significant side effects including abdominal pain and constipation.  She denies polyuria or polydipsia.  In terms of the patient's hypertension she is amenable to adding HCTZ today.  She was previously on this and had some hypokalemia.  At that time she was not on spironolactone.  She denies chest pain, vision changes, difficulty breathing.  She reports overall she is doing well.  The patient was recently treated for Candida glabrata.  She reports she feels intermittent vaginal discharge.  This is not odorous.  She is sexual active with 1 partner.  She has had no vaginal bleeding.  She finished her boric acid treatment approximately 10 to 14 days ago.   She denies dysuria or polyuria  The patient reports intermittent throat pain.  This is been ongoing for 1 day.  She sometimes feels like when she tries take all of her pills at once they get stuck when she takes them individually did not.  She does notice this varies with her belching.  She describes that she intermittently takes the Nexium when she feels like her acid reflux is acting up.  She denies fevers, swelling, pain with swallowing or sensation of meats or other foods getting stuck.  She has had no weight loss.  She reports she has intermittent sharp right-sided neck pain that lasts about a second.  She does endorse some nasal congestion.  Her partner smokes in the home.  She is a former smoker.  PERTINENT  PMH / PSH/Family/Social History : Updated reviewed as appropriate.  OBJECTIVE:   BP (!) 168/79    Pulse 71   Wt 192 lb (87.1 kg)   LMP 07/15/2015 (Exact Date)   SpO2 99%   BMI 34.01 kg/m   Today's weight:  Last Weight  Most recent update: 04/20/2021  2:35 PM    Weight  87.1 kg (192 lb)            Review of prior weights: Filed Weights   04/20/21 1434  Weight: 192 lb (87.1 kg)   HEENT: EOMI. Sclera without injection or icterus. MMM. External auditory canal examined and WNL. TM normal appearance, no erythema or bulging. Neck: Supple.  No cervical lymphadenopathy.  She does have a small 1.5 cm right-sided thyroid nodule that is soft and mobile. Cardiac: Regular rate and rhythm. Normal S1/S2. No murmurs, rubs, or gallops appreciated. Lungs: Clear bilaterally to ascultation.  Abdomen: Normoactive bowel sounds. No tenderness to deep or light palpation. No rebound or guarding.   GU Exam:  Chaperoned exam.  External exam: Normal-appearing female external genitalia.  Vaginal exam notable for normal-appearing discharge withoutodor.  Cervix without discharge or obvious lesion.  Bimanual exam reveals normal sized uterus no masses appreciated, no pain with examination Psych: Pleasant and appropriate    ASSESSMENT/PLAN:   Primary hypertension Not at goal discussed.  Added HCTZ.  Repeat metabolic panel in 2 weeks.  If she has hypokalemia we will simply add on the triamterene containing product.   Type 2 diabetes mellitus with neurological complications [facility]) Discussed  and encouraged dietary changes.  She is due for an A1c at her next visit.  We will continue current therapies at this time.  We will remove Rybelsus from her list. The patient is intolerant of statins.  We discussed starting 1 again today.  She declined.  Renal lesion Reviewed repeat CT with her today.  Candida glabrata infection Follow-up testing for cure completed.   Thyroid nodule question if this is the cause of her intermittent neck pain, could be thyroiditis.  Recommend TSH when she gets her metabolic panel.   Thyroid ultrasound ordered nursing to schedule.  Other etiologies also considered.  If this persists recommend referral to ENT.  Also recommend she restart her Nasacort.  No adenopathy or masses noted on exam today.  HCM We discussed the colonoscopy at length today.  She declined. Discussed and recommended routine vaccines.  She declined today.   Terisa Starr, MD  Family Medicine Teaching Service  [facility]

## 2021-04-20 NOTE — Assessment & Plan Note (Addendum)
Discussed and encouraged dietary changes.  She is due for an A1c at her next visit.  We will continue current therapies at this time.  We will remove Rybelsus from her list. The patient is intolerant of statins.  We discussed starting 1 again today.  She declined.

## 2021-04-20 NOTE — Patient Instructions (Addendum)
It was wonderful to see you today.  Please bring ALL of your medications with you to every visit.   Today we talked about:   - Start nasacort daily  --We will call with the time and date of your thyroid ultrasound in mid December   -- I recommend a potassium check in 3-4 weeks--please call to schedule  -- I sent in your new blood pressure pill to [facility]   - I will check into free mammograms   Thank you for choosing [facility].   Please call [phone] with any questions about today's appointment.  Please be sure to schedule follow up at the front  desk before you leave today.   Terisa Starr, MD  Family Medicine

## 2021-04-20 NOTE — Assessment & Plan Note (Signed)
Reviewed repeat CT with her today.

## 2021-04-21 ENCOUNTER — Encounter: Payer: Self-pay | Admitting: Family Medicine

## 2021-04-21 ENCOUNTER — Telehealth: Payer: Self-pay

## 2021-04-21 ENCOUNTER — Other Ambulatory Visit: Payer: Self-pay | Admitting: Family Medicine

## 2021-04-21 DIAGNOSIS — I10 Essential (primary) hypertension: Secondary | ICD-10-CM

## 2021-04-21 DIAGNOSIS — E041 Nontoxic single thyroid nodule: Secondary | ICD-10-CM

## 2021-04-21 LAB — CERVICOVAGINAL ANCILLARY ONLY
Bacterial Vaginitis (gardnerella): NEGATIVE
Candida Glabrata: POSITIVE — AB
Candida Vaginitis: NEGATIVE
Chlamydia: NEGATIVE
Comment: NEGATIVE
Comment: NEGATIVE
Comment: NEGATIVE
Comment: NEGATIVE
Comment: NEGATIVE
Comment: NORMAL
Neisseria Gonorrhea: NEGATIVE
Trichomonas: NEGATIVE

## 2021-04-21 NOTE — Telephone Encounter (Signed)
Called patient with information on upcoming appointment.  Ultrasound Thyroid 05/25/2021 [facility] [address], [address] [address] arrival @ 1100 $75.00 no show fee if no cancelled within 24 hours of appointment  Self pay rate  Test is $311 40% discount if paid on day of service $186.60  Patient states that she will call GI to determine if they will take her financial assistance card.  Patient requested above location.  Glennie Hawk, CMA'

## 2021-04-21 NOTE — Telephone Encounter (Signed)
Thyroid and BMP ordered as future for collection. Please schedule patient for lab visit on date of ultrasound---can be before or after ultrasound--please call patient to arrange most convenient for her.   Thank you,  Terisa Starr, MD  [facility] Medicine Teaching Service

## 2021-04-21 NOTE — Telephone Encounter (Signed)
Patient called and states that she wants a lab appointment for blood work to check her potasium.  Glennie Hawk, CMA

## 2021-04-22 NOTE — Telephone Encounter (Signed)
Made LAB appointment for patient.  Patient needs to speak with Dr. Manson Passey concerning lab results from pervious test.  Patient states that you can also communicate with her on [facility].  Glennie Hawk, CMA

## 2021-04-22 NOTE — Telephone Encounter (Signed)
Attempted to call patient. Sent MyChart message.  Terisa Starr, MD  Family Medicine Teaching Service

## 2021-05-05 ENCOUNTER — Telehealth: Payer: Self-pay

## 2021-05-05 NOTE — Telephone Encounter (Signed)
Patient calls nurse line requesting to speak with Dr. Manson Passey regarding abdominal pain. Reports stomach upset since Thanksgiving. Describes pain as constant aching, reports that pain has improved since yesterday. Reports slight nausea, no vomiting or diarrhea. Took magnesium citrate yesterday. Reports that pain is sometime in lower abdomen but other days it is in upper abdomen. Denies associated fever. Reports that she has chronic stomach disorder and doesn't know how she should proceed. Patient does have documented history of irritable bowel syndrome.   Patient also reports concern with possible thyroid disorder. Reports increased fatigue, joint pain and loss of hair. Patient reports doing research and believes she may be having issues with her thyroid. Patient has thyroid US and blood work on 12/19. Advised that we would know more once results come back from thyroid US and blood work.   Please advise additional recommendations.   Veronda Prude, RN

## 2021-05-06 ENCOUNTER — Encounter: Payer: Self-pay | Admitting: Family Medicine

## 2021-05-06 NOTE — Telephone Encounter (Signed)
Attempted to call about concerns. Left generic voicemail. Set mychart to follow up.  Terisa Starr, MD  Family Medicine Teaching Service

## 2021-05-07 ENCOUNTER — Encounter: Payer: Self-pay | Admitting: Family Medicine

## 2021-05-25 ENCOUNTER — Ambulatory Visit (INDEPENDENT_AMBULATORY_CARE_PROVIDER_SITE_OTHER): Payer: Self-pay | Admitting: Pharmacist

## 2021-05-25 ENCOUNTER — Other Ambulatory Visit: Payer: Self-pay

## 2021-05-25 ENCOUNTER — Ambulatory Visit
Admission: RE | Admit: 2021-05-25 | Discharge: 2021-05-25 | Disposition: A | Discharge status: Home / Self Care | Payer: No Typology Code available for payment source | Source: Ambulatory Visit | Attending: Family Medicine | Admitting: Family Medicine

## 2021-05-25 DIAGNOSIS — I10 Essential (primary) hypertension: Secondary | ICD-10-CM

## 2021-05-25 DIAGNOSIS — E041 Nontoxic single thyroid nodule: Secondary | ICD-10-CM

## 2021-05-25 DIAGNOSIS — E1149 Type 2 diabetes mellitus with other diabetic neurological complication: Secondary | ICD-10-CM

## 2021-05-25 NOTE — Progress Notes (Signed)
Subjective:    Patient ID: Carmen [name], female    DOB: 01-11-1961, 60 y.o.   MRN: [id number]  HPI Patient is a 60 y.o. female who presents for diabetes management. She is in good spirits and presents without assistance. Patient was referred on 02/26/21 and last seen by Primary Care Provider on 04/20/21. Last seen in pharmacy clinic on 03/30/21.  Patient reports her blood glucose readings have been high and that she has had recurrent yeast infections that have not been going away for the past several months. Patient states Dr. Manson Passey is aware and she is planning to make a follow-up appointment with Dr. Manson Passey at the start of the year. In the meantime she has been taking boric acid suppositories. Patient states recently when she inserted the suppository she had blood on the applicator. She states she had some spotting in the past despite being post-menopausal and was told to let Dr. Manson Passey know if this happened again.  Patient is also concerned with weight loss she has been experiencing over the past year and was wondering if it was attributed to her thyroid as she states Dr. Manson Passey mentioned in the past her thyroid was swollen. Patient is happy to be losing weight but wants to make sure it is not related to cancer as she has a family history.  Patient reports diabetes was diagnosed in 2016.   Insurance coverage/medication affordability: self-pay  Current diabetes medications include: metformin XR 500mg  two tablets twice daily Current hypertension medications include: amlodipine 10mg , carvedilol 25mg  twice daily, hydrochlorothiazide 12.5mg  daily, losartan 100mg , spironolactone 50mg  Patient states that She is taking her medications as prescribed. Patient reports adherence with medications.   Do you feel that your medications are working for you?  no  Have you been experiencing any side effects to the medications prescribed? Yes; Rybelsus caused bloating so patient stopped taking medication  Do  you have any problems obtaining medications due to transportation or finances?  Yes; self-pay   Patient denies hypoglycemic events. Patient denies polyuria (increased urination).  Patient denies polyphagia (increased appetite).  Patient denies polydipsia (increased thirst).  Patient reports neuropathy (nerve pain). Patient denies visual changes. Patient reports self foot exams.   Patient reports she hasn't been checking lately "I just pop my pills and go" Last checked Saturday AM and it was 381 and this morning it was 441 she believes. For dinner last night had potato salad, corn, and pork chops and a donut before bed and again at 4 AM.  Objective:   Labs:   Physical Exam Neurological:     Mental Status: She is alert and oriented to person, place, and time.    Lab Results  Component Value Date   HGBA1C 7.4 (A) 02/16/2021   HGBA1C 8.2 (A) 11/24/2020   HGBA1C 7.6 (A) 08/01/2020    Lab Results  Component Value Date   MICRALBCREAT >300 01/29/2019    Lipid Panel     Component Value Date/Time   CHOL 202 (H) 02/16/2021 1218   TRIG 220 (H) 02/16/2021 1218   HDL 60 02/16/2021 1218   CHOLHDL 3.4 02/16/2021 1218   CHOLHDL 3.7 11/12/2015 1554   VLDL 71 (H) 11/12/2015 1554   LDLCALC 105 (H) 02/16/2021 1218    Clinical Atherosclerotic Cardiovascular Disease (ASCVD): No  The 10-year ASCVD risk score (Arnett DK, et al., 2019) is: 12.1%   Values used to calculate the score:     Age: 16 years     Sex: Female  Is Non-Hispanic African American: No     Diabetic: Yes     Tobacco smoker: No     Systolic Blood Pressure: 168 mmHg     Is BP treated: Yes     HDL Cholesterol: 60 mg/dL     Total Cholesterol: 202 mg/dL    Assessment/Plan:   E5UD is not controlled likely due to sub-optimal lifestyle, apprehension to medication changes, and tolerability issues. Medication adherence appears optimal. Additional pharmacotherapy is warranted. Patient agreeable today to start Ozempic.  Discussed with patient with her blood glucose readings being as elevated as they are would recommend insulin but patient not receptive of starting at this time despite recommendation. Patient states "I can get it down if I do what I know I am supposed to do." In the future, if blood glucose continues to remain elevated and patient still apprehensive to insulin can consider addition of acarbose as patient does not tolerate SGLT2 due to recurrent yeast infections and would not add glipizide. Patient educated on purpose, proper use and potential adverse effects of Ozempic. Patient demonstrated proper injection of Ozempic in clinic and successfully took first dose.  Following instruction patient verbalized understanding of treatment plan.    Gave samples of GLP-1 Ozempic 0.25mg  once weekly on Mondays  Continued metformin XR 500mg  two tablets twice daily Extensively discussed pathophysiology of diabetes, dietary effects on blood sugar control, and recommended lifestyle interventions,  Patient will adhere to dietary modifications Counseled on s/sx of and management of hypoglycemia Next A1C anticipated at next office visit.   Will inform PCP of blood when inserting suppository.  Follow-up appointment with Dr. in four weeks to review sugar readings. Written patient instructions provided.  This appointment required 40 minutes of direct patient care.  Thank you for involving pharmacy to assist in providing this patient's care.  Medication Samples have been provided to the patient.  Drug name: Ozempic    Strength: 0.25mg    Qty: 1 pen LOT: Manson Passey  Exp.Date: 10/05/2023  Dosing instructions: Inject 0.25mg  once weekly  The patient has been instructed regarding the correct time, dose, and frequency of taking this medication, including desired effects and most common side effects.   10/07/2023 2:21 PM 05/25/2021

## 2021-05-25 NOTE — Progress Notes (Deleted)
Subjective:    Patient ID: Carmen [name], female    DOB: May 09, 1961, 60 y.o.   MRN: [id number]  HPI Patient is a 60 y.o. {Desc; female/female:11659} who presents for diabetes management. {He/she (caps):30048} is in *** spirits and presents {w-w/o:315700} assistance. Patient was referred on *** and last seen by Primary Care Provider on ***  PMH significant for ***.   Patient reports diabetes was diagnosed in ***.   Insurance coverage/medication affordability: ***  Family/Social history: ***  Current diabetes medications include: *** Current hypertension medications include: *** Current hyperlipidemia medications include: *** Patient states that {He/she (caps):30048} {Is/is not:9024} taking {his/her/their:21314} medications as prescribed. Patient {Actions; denies-reports:120008} adherence with medications. Patient states that {He/she (caps):30048} misses {his/her/their:21314} medications *** times per week, on average.  Do you feel that your medications are working for you?  {YES NO:22349}  Have you been experiencing any side effects to the medications prescribed? {YES NO:22349}  Do you have any problems obtaining medications due to transportation or finances?  {YES [id number]     Patient reported dietary habits:  Eats *** meals/day and *** snacks/day; [place] with *** meals/day and *** snacks/day Breakfast:*** Lunch:*** Dinner:*** Snacks:*** Drinks:***  Patient-reported exercise habits: ***   Patient {Actions; denies-reports:120008} hypoglycemic events. Patient {Actions; denies-reports:120008} polyuria (increased urination).  Patient {Actions; denies-reports:120008} polyphagia (increased appetite).  Patient {Actions; denies-reports:120008} polydipsia (increased thirst).  Patient {Actions; denies-reports:120008} neuropathy (nerve pain). Patient {Actions; denies-reports:120008} visual changes. Patient {Actions; denies-reports:120008} self foot exams.   Home fasting blood  sugars: ***  2 hour post-meal/random blood sugars: ***  Objective:   Labs:   Physical Exam  ROS  Lab Results  Component Value Date   HGBA1C 7.4 (A) 02/16/2021   HGBA1C 8.2 (A) 11/24/2020   HGBA1C 7.6 (A) 08/01/2020    There were no vitals filed for this visit.  Lab Results  Component Value Date   MICRALBCREAT >300 01/29/2019    Lipid Panel     Component Value Date/Time   CHOL 202 (H) 02/16/2021 1218   TRIG 220 (H) 02/16/2021 1218   HDL 60 02/16/2021 1218   CHOLHDL 3.4 02/16/2021 1218   CHOLHDL 3.7 11/12/2015 1554   VLDL 71 (H) 11/12/2015 1554   LDLCALC 105 (H) 02/16/2021 1218    Clinical Atherosclerotic Cardiovascular Disease (ASCVD): {YES/NO:21197} The 10-year ASCVD risk score (Arnett DK, et al., 2019) is: 12.1%   Values used to calculate the score:     Age: 60 years     Sex: Female     Is Non-Hispanic African American: No     Diabetic: Yes     Tobacco smoker: No     Systolic Blood Pressure: 168 mmHg     Is BP treated: Yes     HDL Cholesterol: 60 mg/dL     Total Cholesterol: 202 mg/dL   PHQ-9 Score: ***  Assessment/Plan:   ***T1/T2DM {ACTION; IS/IS WNU:27253664} controlled likely due to ***. Medication adherence appears ***. Additional pharmacotherapy {ACTION; IS/IS QIH:[id number]}. Patient with a history of intolerance to ***. Will initiate ***. Benefits of medication include ***. Patient educated on purpose, proper use and potential adverse effects of ***.  Following instruction patient verbalized understanding of treatment plan.    {Meds adjust:18428} basal insulin *** (insulin ***). Patient will continue to titrate 1 unit every *** days if fasting blood sugar > 100mg /dl until fasting blood sugars reach goal or next visit. {Meds adjust:18428}  rapid insulin *** (insulin ***) to ***.  {Meds adjust:18428} GLP-1 *** (generic name***) to ***.  {  Meds adjust:18428} SGLT2-I *** (generic name***) to ***. Counseled on sick day rules for ***. Extensively discussed  pathophysiology of diabetes, dietary effects on blood sugar control, and recommended lifestyle interventions,  Patient will adhere to dietary modifications Patient will exercise *** with goal to increase towards target of at least 150 minutes of moderate intensity exercise weekly Counseled on s/sx of and management of hypoglycemia Next A1C anticipated ***.   ASCVD risk - primary***secondary prevention in patient with diabetes. Last LDL {Is/is not:9024} controlled. ASCVD risk score {Is/is not:9024} >20%  - {Desc; low/moderate/high:110033} intensity statin indicated. Aspirin {Is/is not:9024} indicated.   {Meds adjust:18428} aspirin *** mg  {Meds adjust:18428} ***statin *** mg.   Hypertension longstanding*** currently ***.  Blood pressure goal = *** mmHg. Medication adherence ***.  Blood pressure control is suboptimal due to ***.  *** Extensively discussed pathophysiology of blood pressure, dietary effects on blood pressure control, and recommended lifestyle interventions  Follow-up appointment *** to review sugar readings. Written patient instructions provided.  This appointment required *** minutes of patient care (this includes precharting, chart review, review of results, and face-to-face care).  Thank you for involving pharmacy to assist in providing this patient's care.  Patient seen with ***

## 2021-05-25 NOTE — Patient Instructions (Signed)
Carmen [name] it was a pleasure seeing you today.   Please do the following:  Continue all your medications and continue Ozempic 0.25mg  once weekly on Mondays as directed today during your appointment. If you have any questions or if you believe something has occurred because of this change, call me or your doctor to let one of [place] know.  Continue checking blood sugars at home. It's really important that you record these and bring these in to your next doctor's appointment.  Continue making the lifestyle changes we've discussed together during our visit. Diet and exercise play a significant role in improving your blood sugars.  Follow-up with Dr. Manson Passey in one month   Hypoglycemia or low blood sugar:   Low blood sugar can happen quickly and may become an emergency if not treated right away.   While this shouldn't happen often, it can be brought upon if you skip a meal or do not eat enough. Also, if your insulin or other diabetes medications are dosed too high, this can cause your blood sugar to go to low.   Warning signs of low blood sugar include: Feeling shaky or dizzy Feeling weak or tired  Excessive hunger Feeling anxious or upset  Sweating even when you aren't exercising  What to do if I experience low blood sugar? Follow the Rule of 15 Check your blood sugar with your meter. If lower than 70, proceed to step 2.  Treat with 15 grams of fast acting carbs which is found in 3-4 glucose tablets. If none are available you can try hard candy, 1 tablespoon of sugar or honey,4 ounces of fruit juice, or 6 ounces of REGULAR soda.  Re-check your sugar in 15 minutes. If it is still below 70, do what you did in step 2 again. If your blood sugar has come back up, go ahead and eat a snack or small meal made up of complex carbs (ex. Whole grains) and protein at this time to avoid recurrence of low blood sugar.

## 2021-05-26 LAB — BASIC METABOLIC PANEL
BUN/Creatinine Ratio: 26 — ABNORMAL HIGH (ref 9–23)
BUN: 21 mg/dL (ref 6–24)
CO2: 22 mmol/L (ref 20–29)
Calcium: 8.5 mg/dL — ABNORMAL LOW (ref 8.7–10.2)
Chloride: 93 mmol/L — ABNORMAL LOW (ref 96–106)
Creatinine, Ser: 0.82 mg/dL (ref 0.57–1.00)
Glucose: 471 mg/dL — ABNORMAL HIGH (ref 70–99)
Potassium: 4.4 mmol/L (ref 3.5–5.2)
Sodium: 131 mmol/L — ABNORMAL LOW (ref 134–144)
eGFR: 82 mL/min/{1.73_m2} (ref >59)

## 2021-05-26 LAB — TSH: TSH: 4.91 u[IU]/mL — ABNORMAL HIGH (ref 0.450–4.500)

## 2021-05-27 ENCOUNTER — Encounter: Payer: Self-pay | Admitting: Family Medicine

## 2021-05-27 MED ORDER — OZEMPIC (0.25 OR 0.5 MG/DOSE) 2 MG/1.5ML ~~LOC~~ SOPN: 0.2500 mg | PEN_INJECTOR | SUBCUTANEOUS | 0 refills | Status: DC

## 2021-05-27 NOTE — Telephone Encounter (Signed)
Please call patient and schedule visit for January for exam for yeast.   Thank you, Terisa Starr, MD  [facility] Medicine Teaching Service

## 2021-05-27 NOTE — Assessment & Plan Note (Signed)
T2DM is not controlled likely due to sub-optimal lifestyle, apprehension to medication changes, and tolerability issues. Medication adherence appears optimal. Additional pharmacotherapy is warranted. Patient agreeable today to start Ozempic. Discussed with patient with her blood glucose readings being as elevated as they are would recommend insulin but patient not receptive of starting at this time despite recommendation. Patient states "I can get it down if I do what I know I am supposed to do." In the future, if blood glucose continues to remain elevated and patient still apprehensive to insulin can consider addition of acarbose as patient does not tolerate SGLT2 due to recurrent yeast infections and would not add glipizide. Patient educated on purpose, proper use and potential adverse effects of Ozempic. Patient demonstrated proper injection of Ozempic in clinic and successfully took first dose.  Following instruction patient verbalized understanding of treatment plan.    1. Gave samples of GLP-1 Ozempic 0.25mg  once weekly on Mondays  2. Continued metformin XR 500mg  two tablets twice daily 3. Extensively discussed pathophysiology of diabetes, dietary effects on blood sugar control, and recommended lifestyle interventions,  4. Patient will adhere to dietary modifications 5. Counseled on s/sx of and management of hypoglycemia 6. Next A1C anticipated at next office visit.

## 2021-05-27 NOTE — Telephone Encounter (Signed)
Patient returns call to nurse line. Patient reports that she is still having numbness and tingling in left hand.   Recommended that patient be evaluated in UC or ED. Patient states that she will proceed for evaluation and ED near her home.   Veronda Prude, RN

## 2021-05-28 ENCOUNTER — Telehealth: Payer: Self-pay | Admitting: Family Medicine

## 2021-05-28 NOTE — Telephone Encounter (Signed)
Attempted to call about thyroid results as well as ultrasound.  Left generic voicemail.  We will send notes over MyChart.

## 2021-06-03 NOTE — Progress Notes (Signed)
Submitted application for OZEMPIC to [employer] for patient assistance.   Phone: [phone]  

## 2021-06-04 ENCOUNTER — Encounter: Payer: Self-pay | Admitting: Family Medicine

## 2021-06-05 ENCOUNTER — Telehealth: Payer: Self-pay | Admitting: Family Medicine

## 2021-06-05 NOTE — Telephone Encounter (Signed)
Patient returned call to nurse line. Patient reports that she took a home test that was invalid. Patient states that she will attempt another test tomorrow. Patient states that she is going to try to get transportation tomorrow to get tested for COVID.   Patient states that she knows that she was exposed at Christmas and is having symptoms similar to the last time she had COVID.   Veronda Prude, RN

## 2021-06-05 NOTE — Telephone Encounter (Signed)
Attempted to call patient about COVID-19.  She has not yet responded to whether or not she is actually tested positive for COVID.  Left generic voicemail for patient that I called.  If patient calls and requests antiviral over the weekend Paxlovid would be appropriate for her.  She will need to stop her amlodipine and only restart 3 days after completion of the Paxlovid  Terisa Starr, MD  [facility] Medicine Teaching Service

## 2021-06-08 ENCOUNTER — Ambulatory Visit (HOSPITAL_COMMUNITY)
Admission: EM | Admit: 2021-06-08 | Discharge: 2021-06-08 | Disposition: A | Discharge status: Home / Self Care | Payer: Self-pay | Attending: Family Medicine | Admitting: Family Medicine

## 2021-06-08 ENCOUNTER — Encounter (HOSPITAL_COMMUNITY): Payer: Self-pay

## 2021-06-08 ENCOUNTER — Other Ambulatory Visit: Payer: Self-pay

## 2021-06-08 DIAGNOSIS — Z20822 Contact with and (suspected) exposure to covid-19: Secondary | ICD-10-CM | POA: Insufficient documentation

## 2021-06-08 DIAGNOSIS — J069 Acute upper respiratory infection, unspecified: Secondary | ICD-10-CM | POA: Insufficient documentation

## 2021-06-08 NOTE — ED Triage Notes (Signed)
Pt presents with complaints of headache and general fatigue. Reports being exposed to sick family members over the holidays.

## 2021-06-08 NOTE — Discharge Instructions (Addendum)
°  You have been swabbed for COVID, and the test will result in the next 24 hours. Our staff will call you if positive.   Tylenol as needed for headache

## 2021-06-08 NOTE — ED Provider Notes (Addendum)
[facility]    CSN: [id number] Arrival date & time: 06/08/21  1056      History   Chief Complaint Chief Complaint  Patient presents with   Headache    HPI [name] is a 61 y.o. female.    Headache Here with approx 8 days of "prickliness" in her left head, mild nasal congestion, and some upset stomach. No vomiting/diarrhea. No cough or dyspnea. Maybe felt warm, but no fever documented and no chills.  Was exposed on 12/24 to covid. Found out later that people were positive. Some of them were wearing masks.  PMH: DM, not well controlled currently. Has had covid previously. No COVID vaccines so far  Past Medical History:  Diagnosis Date   Adhesive capsulitis of shoulder    Allergy    Borderline personality disorder ([place])    Carpal tunnel syndrome    COPD (chronic obstructive pulmonary disease) (HCC)    Depression    GERD (gastroesophageal reflux disease)    Hypertension    Nephrolithiasis    Type 2 diabetes mellitus ([place])     Patient Active Problem List   Diagnosis Date Noted   Renal lesion 03/20/2021   Candida glabrata infection 03/08/2021   Neuropathic pain 03/16/2019   Type 2 diabetes mellitus with neurological complications ([place]) 0000000   Carpal tunnel syndrome 06/10/2010   Osteoarthritis of multiple joints 07/30/2008   Adhesive capsulitis of shoulder 01/15/2008   Obesity 07/25/2007   Major depressive disorder, recurrent episode ([place]) 08/04/2006   Primary hypertension 08/04/2006   Irritable bowel syndrome 08/04/2006    Past Surgical History:  Procedure Laterality Date   CHOLECYSTECTOMY     TONSILLECTOMY      OB History   No obstetric history on file.      Home Medications    Prior to Admission medications   Medication Sig Start Date End Date Taking? Authorizing Provider  amLODipine (NORVASC) 10 MG tablet Take 1 tablet (10 mg total) by mouth at bedtime. 11/26/20   Martyn Malay, MD  carvedilol (COREG) 25 MG tablet TAKE  1 Tablet  BY MOUTH TWICE DAILY WITH A MEAL 12/09/20   Martyn Malay, MD  gabapentin (NEURONTIN) 300 MG capsule Take 2 capsules (600 mg total) by mouth at bedtime. 03/31/21   Martyn Malay, MD  hydrochlorothiazide (HYDRODIURIL) 12.5 MG tablet Take 1 tablet (12.5 mg total) by mouth daily. 04/20/21   Martyn Malay, MD  losartan (COZAAR) 100 MG tablet Take 1 tablet (100 mg total) by mouth at bedtime. 09/17/20   Martyn Malay, MD  metFORMIN (GLUCOPHAGE-XR) 500 MG 24 hr tablet TAKE 2 TABLETS BY MOUTH TWICE DAILY 09/17/20   Martyn Malay, MD  Semaglutide,0.25 or 0.5MG /DOS, (OZEMPIC, 0.25 OR 0.5 MG/DOSE,) 2 MG/1.5ML SOPN Inject 0.25 mg into the skin once a week. 05/27/21   Martyn Malay, MD  spironolactone (ALDACTONE) 50 MG tablet Take 1 tablet (50 mg total) by mouth daily. 03/20/21   Martyn Malay, MD  venlafaxine (EFFEXOR) 75 MG tablet Take 1 tablet (75 mg total) by mouth 2 (two) times daily. 03/15/11 08/19/11  Donnamae Jude, MD    Family History Family History  Problem Relation Age of Onset   Diabetes Mother    Hypertension Mother    Alzheimer's disease Father    Dementia Father    Ovarian cancer Other        aunt uncertain age    Social History Social History   Tobacco Use  Smoking status: Former    Packs/day: 0.25    Years: 30.00    Pack years: 7.50    Types: Cigarettes    Quit date: 08/13/2007    Years since quitting: 13.8   Smokeless tobacco: Never   Tobacco comments:    boyfriend smokes  Vaping Use   Vaping Use: Never used  Substance Use Topics   Alcohol use: No    Comment: rarely   Drug use: No     Allergies   Ace inhibitors and Metformin and related   Review of Systems Review of Systems  Neurological:  Positive for headaches.    Physical Exam Triage Vital Signs ED Triage Vitals  Enc Vitals Group     BP 06/08/21 1154 (!) 143/98     Pulse Rate 06/08/21 1151 85     Resp 06/08/21 1151 19     Temp 06/08/21 1151 97.9 F (36.6 C)     Temp src --       SpO2 06/08/21 1151 97 %     Weight --      Height --      Head Circumference --      Peak Flow --      Pain Score --      Pain Loc --      Pain Edu? --      Excl. in [place]? --    No data found.  Updated Vital Signs BP (!) 143/98    Pulse 85    Temp 97.9 F (36.6 C)    Resp 19    LMP 07/15/2015 (Exact Date)    SpO2 97%   Visual Acuity Right Eye Distance:   Left Eye Distance:   Bilateral Distance:    Right Eye Near:   Left Eye Near:    Bilateral Near:     Physical Exam Vitals reviewed.  Constitutional:      General: She is not in acute distress.    Appearance: She is not ill-appearing, toxic-appearing or diaphoretic.  HENT:     Right Ear: Tympanic membrane and ear canal normal.     Left Ear: Tympanic membrane and ear canal normal.     Nose: Nose normal.     Mouth/Throat:     Mouth: Mucous membranes are moist.     Pharynx: No oropharyngeal exudate or posterior oropharyngeal erythema.  Eyes:     Extraocular Movements: Extraocular movements intact.     Conjunctiva/sclera: Conjunctivae normal.     Pupils: Pupils are equal, round, and reactive to light.  Cardiovascular:     Rate and Rhythm: Normal rate and regular rhythm.     Heart sounds: No murmur heard. Pulmonary:     Effort: Pulmonary effort is normal.     Breath sounds: No wheezing, rhonchi or rales.  Musculoskeletal:     Cervical back: Neck supple.  Lymphadenopathy:     Cervical: No cervical adenopathy.  Skin:    Capillary Refill: Capillary refill takes less than 2 seconds.     Coloration: Skin is not jaundiced or pale.  Neurological:     General: No focal deficit present.     Mental Status: She is oriented to person, place, and time.  Psychiatric:        Behavior: Behavior normal.     UC Treatments / Results  Labs (all labs ordered are listed, but only abnormal results are displayed) Labs Reviewed  SARS CORONAVIRUS 2 (TAT 6-24 HRS)    EKG   Radiology No  results found.  Procedures Procedures  (including critical care time)  Medications Ordered in UC Medications - No data to display  Initial Impression / Assessment and Plan / UC Course  I have reviewed the triage vital signs and the nursing notes.  Pertinent labs & imaging results that were available during my care of the patient were reviewed by me and considered in my medical decision making (see chart for details).     She wants to be tested for COVID, though she understands that she is outside the window for antiviral treatment. She also stated she would never get the vaccine.  Final Clinical Impressions(s) / UC Diagnoses   Final diagnoses:  Viral upper respiratory tract infection  Exposure to COVID-19 virus     Discharge Instructions       You have been swabbed for COVID, and the test will result in the next 24 hours. Our staff will call you if positive.   Tylenol as needed for headache     ED Prescriptions   None    PDMP not reviewed this encounter.   Barrett Henle, MD 06/08/21 1228    Barrett Henle, MD 06/08/21 1229

## 2021-06-09 ENCOUNTER — Telehealth: Payer: Self-pay | Admitting: Family Medicine

## 2021-06-09 LAB — SARS CORONAVIRUS 2 (TAT 6-24 HRS): SARS Coronavirus 2: NEGATIVE

## 2021-06-09 NOTE — Telephone Encounter (Signed)
Attempted to call patient about symptoms. Left generic voicemail to call back.   Terisa Starr, MD  Family Medicine Teaching Service

## 2021-06-11 NOTE — Progress Notes (Signed)
Received notification from [employer] regarding approval for OZEMPIC. Patient assistance approved from 06/05/21 to 05/30/22.  A 4 MONTH SUPPLY OF MEDICATION IS PROCESSING & WILL SHIP TO OFFICE ([facility])  Phone: [phone]

## 2021-06-30 ENCOUNTER — Telehealth: Payer: Self-pay

## 2021-06-30 NOTE — Telephone Encounter (Signed)
Left voicemail regarding pap medication being ready for pickup.  Ozempic is labeled and ready in med room fridge.

## 2021-07-01 ENCOUNTER — Encounter: Payer: Self-pay | Admitting: Family Medicine

## 2021-07-01 ENCOUNTER — Ambulatory Visit (INDEPENDENT_AMBULATORY_CARE_PROVIDER_SITE_OTHER): Payer: Self-pay | Admitting: Family Medicine

## 2021-07-01 ENCOUNTER — Other Ambulatory Visit (HOSPITAL_COMMUNITY)
Admission: RE | Admit: 2021-07-01 | Discharge: 2021-07-01 | Disposition: A | Discharge status: Home / Self Care | Payer: Self-pay | Source: Ambulatory Visit | Attending: Family Medicine | Admitting: Family Medicine

## 2021-07-01 ENCOUNTER — Other Ambulatory Visit: Payer: Self-pay

## 2021-07-01 VITALS — BP 135/78 | HR 79 | Wt 188.2 lb

## 2021-07-01 DIAGNOSIS — E6609 Other obesity due to excess calories: Secondary | ICD-10-CM

## 2021-07-01 DIAGNOSIS — E041 Nontoxic single thyroid nodule: Secondary | ICD-10-CM

## 2021-07-01 DIAGNOSIS — I10 Essential (primary) hypertension: Secondary | ICD-10-CM

## 2021-07-01 DIAGNOSIS — E1149 Type 2 diabetes mellitus with other diabetic neurological complication: Secondary | ICD-10-CM

## 2021-07-01 DIAGNOSIS — B3731 Acute candidiasis of vulva and vagina: Secondary | ICD-10-CM | POA: Insufficient documentation

## 2021-07-01 DIAGNOSIS — B379 Candidiasis, unspecified: Secondary | ICD-10-CM

## 2021-07-01 DIAGNOSIS — Z6833 Body mass index (BMI) 33.0-33.9, adult: Secondary | ICD-10-CM

## 2021-07-01 NOTE — Progress Notes (Signed)
° ° °  SUBJECTIVE:   CHIEF COMPLAINT: follow up medications  HPI:   Carmen [name] is a 61 y.o.  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  She reports overall she is doing well.  Her diet continues to be a struggle for her.  More recently she has been eating a lot of pizza.  She is taking maximal dose metformin and semaglutide 0.25 mg.  She finds benefit from the semaglutide.  She does have less than appetite.  She is pleased with her weight loss.  She denies nausea, vomiting, diarrhea or abdominal pain.  The patient reports her vaginal discharge is slightly improved she does not notice an odor anymore.  She is not currently sexually active.  She last used boric acid 3 weeks ago.  The patient reports compliance with her antihypertensives.  She denies headaches, chest pain, dyspnea.  Her blood pressure is at goal today and she is pleased about this.  PERTINENT  PMH / PSH/Family/Social History : Updated and reviewed as appropriate  OBJECTIVE:   BP 135/78    Pulse 79    Wt 188 lb 3.2 oz (85.4 kg)    LMP 07/15/2015 (Exact Date)    SpO2 99%    BMI 33.34 kg/m   Today's weight:  Last Weight  Most recent update: 07/01/2021 10:40 AM    Weight  85.4 kg (188 lb 3.2 oz)            Review of prior weights: Filed Weights   07/01/21 1040  Weight: 188 lb 3.2 oz (85.4 kg)    Cardiac: Regular rate and rhythm. Normal S1/S2. No murmurs, rubs, or gallops appreciated. GU Exam:  Chaperoned exam.  External exam: Normal-appearing female external genitalia.  Vaginal exam notable for moderate white discharge.  Cervix without discharge or obvious lesion.  Psych: Pleasant and appropriate    ASSESSMENT/PLAN:   Type 2 diabetes mellitus with neurological complications (HCC) Patient declined A1c today.  Her blood sugars are still elevated.  Recommended increasing Ozempic.  Will obtain  BMP today to further evaluate glucose.  Discussed that she is due for an A1c at follow-up.  Obesity Congratulated on weight loss.  Continue Ozempic.  Candida glabrata infection Repeat testing obtained today.  Discussed possible referral to gynecology given this recurrence and difficulty with treating.  We discussed the importance of glucose control to reduce her Candida infections.   Elevated TSH repeat today   HTN At goal-- repeat BMP as recently started HCTZ   Healthcare maintenance recommended evaluation with gastroenterology for colonoscopy, she declined. Declined COVID vaccine.    Terisa Starr, MD  Family Medicine Teaching Service  [facility]

## 2021-07-01 NOTE — Assessment & Plan Note (Signed)
Repeat testing obtained today.  Discussed possible referral to gynecology given this recurrence and difficulty with treating.  We discussed the importance of glucose control to reduce her Candida infections.

## 2021-07-01 NOTE — Assessment & Plan Note (Signed)
Congratulated on weight loss.  Continue Ozempic.

## 2021-07-01 NOTE — Patient Instructions (Addendum)
It was wonderful to see you today.  Please bring ALL of your medications with you to every visit.   Today we talked about:  Increase ozempic to 0.5 mg   I will call you with results of blood work  Follow up 1-2 months for an A1C--you are due!  I will call you with test results    Thank you for choosing [facility].   Please call [phone] with any questions about today's appointment.  Please be sure to schedule follow up at the front  desk before you leave today.   Terisa Starr, MD  Family Medicine

## 2021-07-01 NOTE — Assessment & Plan Note (Signed)
Patient declined A1c today.  Her blood sugars are still elevated.  Recommended increasing Ozempic.  Will obtain BMP today to further evaluate glucose.  Discussed that she is due for an A1c at follow-up.

## 2021-07-02 LAB — BASIC METABOLIC PANEL
BUN/Creatinine Ratio: 20 (ref 9–23)
BUN: 15 mg/dL (ref 6–24)
CO2: 25 mmol/L (ref 20–29)
Calcium: 9.7 mg/dL (ref 8.7–10.2)
Chloride: 96 mmol/L (ref 96–106)
Creatinine, Ser: 0.76 mg/dL (ref 0.57–1.00)
Glucose: 298 mg/dL — ABNORMAL HIGH (ref 70–99)
Potassium: 4 mmol/L (ref 3.5–5.2)
Sodium: 140 mmol/L (ref 134–144)
eGFR: 90 mL/min/{1.73_m2} (ref >59)

## 2021-07-02 LAB — TSH: TSH: 4.67 u[IU]/mL — ABNORMAL HIGH (ref 0.450–4.500)

## 2021-07-02 LAB — T4, FREE: Free T4: 1.21 ng/dL (ref 0.82–1.77)

## 2021-07-02 LAB — T3: T3, Total: 99 ng/dL (ref 71–180)

## 2021-07-03 LAB — CERVICOVAGINAL ANCILLARY ONLY
Bacterial Vaginitis (gardnerella): NEGATIVE
Candida Glabrata: NEGATIVE
Candida Vaginitis: NEGATIVE
Chlamydia: NEGATIVE
Comment: NEGATIVE
Comment: NEGATIVE
Comment: NEGATIVE
Comment: NEGATIVE
Comment: NEGATIVE
Comment: NORMAL
Neisseria Gonorrhea: NEGATIVE
Trichomonas: NEGATIVE

## 2021-07-22 ENCOUNTER — Encounter: Payer: Self-pay | Admitting: Family Medicine

## 2021-07-22 ENCOUNTER — Other Ambulatory Visit: Payer: Self-pay | Admitting: *Deleted

## 2021-07-22 DIAGNOSIS — E119 Type 2 diabetes mellitus without complications: Secondary | ICD-10-CM

## 2021-07-22 MED ORDER — METFORMIN HCL ER 500 MG PO TB24: ORAL_TABLET | ORAL | 5 refills | Status: DC

## 2021-07-22 NOTE — Telephone Encounter (Signed)
Invokana not refilled given this likely contributed to multiple visits for Candida infections. Will message

## 2021-07-22 NOTE — Telephone Encounter (Signed)
Also rx request for invokana 300mg  tablets. Not on med list. Please advise. Koston Hennes , CMA

## 2021-07-31 ENCOUNTER — Ambulatory Visit (INDEPENDENT_AMBULATORY_CARE_PROVIDER_SITE_OTHER): Payer: Self-pay | Admitting: Family Medicine

## 2021-07-31 ENCOUNTER — Other Ambulatory Visit (HOSPITAL_COMMUNITY)
Admission: RE | Admit: 2021-07-31 | Discharge: 2021-07-31 | Disposition: A | Discharge status: Home / Self Care | Payer: Self-pay | Source: Ambulatory Visit | Attending: Family Medicine | Admitting: Family Medicine

## 2021-07-31 ENCOUNTER — Other Ambulatory Visit: Payer: Self-pay

## 2021-07-31 VITALS — BP 152/79 | HR 83 | Wt 188.0 lb

## 2021-07-31 DIAGNOSIS — N760 Acute vaginitis: Secondary | ICD-10-CM

## 2021-07-31 DIAGNOSIS — N898 Other specified noninflammatory disorders of vagina: Secondary | ICD-10-CM | POA: Insufficient documentation

## 2021-07-31 LAB — POCT WET PREP (WET MOUNT)
Clue Cells Wet Prep Whiff POC: NEGATIVE
Trichomonas Wet Prep HPF POC: ABSENT

## 2021-07-31 NOTE — Assessment & Plan Note (Addendum)
61 y.o. female with vaginal discomfort for several days, history of candida glabrata infection recently. She is in process of getting DM controlled with ozempic. Uncontrolled DM is more likely to be culprit of symptoms, GLP1s are not know for vaginitis, she is not on invokana anymore or any SGLT2is. Last A1c was 7.4% in September 2022. KOH and wet prep slide here negative for yeast. Aptima swab for candida glabrata sent. Patient did have normal color of vaginal rugae, but thinning exterior skin. Could potentially benefit from HRT if symptoms persist. Also consider other infections. Patient reports she is strictly monogamous with one partner and they do not have intercourse often, denies dyspareunia. Also consider mycoplasma/ureaplasma if symptoms persist without other explanation.

## 2021-07-31 NOTE — Patient Instructions (Signed)
It was a pleasure to see you today!  We will get some labs today.  If they are abnormal or we need to do something about them, I will call you.  If they are normal, I will send you a message on MyChart (if it is active) or a letter in the mail.  If you don't hear from [place] in 2 weeks, please call the office  [phone]. You can go ahead and try the vaginal probiotics and/or the boric acid suppositories Follow up with Dr. Owens Shark to check your A1c   Be Well,  Dr. Chauncey Reading

## 2021-07-31 NOTE — Progress Notes (Signed)
° ° °  SUBJECTIVE:   CHIEF COMPLAINT / HPI:   Vaginal Discharge: Patient is a 61 y.o. female presenting with vaginal discomfort for a few days. She reports that she took a bath this week and these symptoms often start after a bath. She had candida glabrata earlier this year which failed treatment with diflucan, but similar symptoms of vaginal discomfort improved with boric acid suppositories.  She endorses mild vaginal odor, no itching, no notable discharge, no bleeding. She had negative testing for GC and trich on 06/29/21. She does not want to be tested for anything other than yeast today, declines STI testing. Denies dysuria, urinary urgency or frequency. No flank pain, fever, or chills, denies n/v/d. She has uncontrolled Dm2, but has started ozempic in the last 2 months and reports decrease in appetite, has lost 3 lbs. Reports good adherence to ozempic.  PERTINENT  PMH / PSH: DM2  OBJECTIVE:   BP (!) 152/79    Pulse 83    Wt 188 lb (85.3 kg)    LMP 07/15/2015 (Exact Date)    SpO2 100%    BMI 33.30 kg/m    General: NAD, pleasant, able to participate in exam Respiratory: Normal effort, no obvious respiratory distress Pelvic: VULVA: normal appearing vulva with no masses, tenderness or lesions, VAGINA: Normal appearing vagina with normal color, no lesions, with scant, white, and thick discharge present, no lesions CERVIX: No lesions, scant, clear, and mucoid discharge present  ASSESSMENT/PLAN:   Vaginitis 61 y.o. female with vaginal discomfort for several days, history of candida glabrata infection recently. She is in process of getting DM controlled with ozempic. Uncontrolled DM is more likely to be culprit of symptoms, GLP1s are not know for vaginitis, she is not on invokana anymore or any SGLT2is. Last A1c was 7.4% in September 2022. KOH and wet prep slide here negative for yeast. Aptima swab for candida glabrata sent. Patient did have normal color of vaginal rugae, but thinning exterior skin.  Could potentially benefit from HRT if symptoms persist. Also consider other infections. Patient reports she is strictly monogamous with one partner and they do not have intercourse often, denies dyspareunia.

## 2021-08-03 ENCOUNTER — Encounter: Payer: Self-pay | Admitting: Family Medicine

## 2021-08-03 ENCOUNTER — Telehealth: Payer: Self-pay

## 2021-08-03 LAB — CERVICOVAGINAL ANCILLARY ONLY
Candida Glabrata: NEGATIVE
Candida Vaginitis: NEGATIVE
Comment: NEGATIVE
Comment: NEGATIVE

## 2021-08-03 NOTE — Telephone Encounter (Signed)
Patient calls nurse line regarding results from visit on Friday, 2/24. Patient is asking if either swab tested positive for yeast. Informed patient of negative results. Patient is asking if she should go ahead and proceed to take the boric acid suppository for symptoms.   Patient requests response via mychart.   Talbot Grumbling, RN

## 2021-08-04 ENCOUNTER — Encounter: Payer: Self-pay | Admitting: Family Medicine

## 2021-08-06 ENCOUNTER — Other Ambulatory Visit: Payer: Self-pay | Admitting: Family Medicine

## 2021-08-06 ENCOUNTER — Encounter: Payer: Self-pay | Admitting: Family Medicine

## 2021-08-06 DIAGNOSIS — E785 Hyperlipidemia, unspecified: Secondary | ICD-10-CM

## 2021-08-12 ENCOUNTER — Encounter: Payer: Self-pay | Admitting: Family Medicine

## 2021-08-12 DIAGNOSIS — I10 Essential (primary) hypertension: Secondary | ICD-10-CM

## 2021-08-12 MED ORDER — SPIRONOLACTONE 50 MG PO TABS: 50.0000 mg | ORAL_TABLET | Freq: Every day | ORAL | 3 refills | Status: DC

## 2021-08-14 ENCOUNTER — Ambulatory Visit (INDEPENDENT_AMBULATORY_CARE_PROVIDER_SITE_OTHER): Payer: Self-pay | Admitting: Family Medicine

## 2021-08-14 ENCOUNTER — Encounter: Payer: Self-pay | Admitting: Family Medicine

## 2021-08-14 ENCOUNTER — Other Ambulatory Visit: Payer: Self-pay

## 2021-08-14 VITALS — BP 111/62 | HR 86 | Ht 63.0 in | Wt 186.0 lb

## 2021-08-14 DIAGNOSIS — I10 Essential (primary) hypertension: Secondary | ICD-10-CM

## 2021-08-14 DIAGNOSIS — M791 Myalgia, unspecified site: Secondary | ICD-10-CM

## 2021-08-14 DIAGNOSIS — Z789 Other specified health status: Secondary | ICD-10-CM

## 2021-08-14 DIAGNOSIS — K580 Irritable bowel syndrome with diarrhea: Secondary | ICD-10-CM

## 2021-08-14 DIAGNOSIS — F331 Major depressive disorder, recurrent, moderate: Secondary | ICD-10-CM

## 2021-08-14 DIAGNOSIS — T466X5A Adverse effect of antihyperlipidemic and antiarteriosclerotic drugs, initial encounter: Secondary | ICD-10-CM

## 2021-08-14 DIAGNOSIS — E1149 Type 2 diabetes mellitus with other diabetic neurological complication: Secondary | ICD-10-CM

## 2021-08-14 LAB — POCT GLYCOSYLATED HEMOGLOBIN (HGB A1C): HbA1c, POC (controlled diabetic range): 8 % — AB (ref 0.0–7.0)

## 2021-08-14 NOTE — Assessment & Plan Note (Signed)
At goal, continue current therapy. BMP at follow up.  ?

## 2021-08-14 NOTE — Assessment & Plan Note (Signed)
A1c is near goal, we discussed dietary changes.  We will increase dose of Ozempic.  Continue metformin therapy.  Counseled on statin therapy.  Encouraged her to consider.  She is already on an angiotensin receptor blocker. ?
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
## 2021-08-14 NOTE — Assessment & Plan Note (Signed)
Offered and recommended cardiology referral today.  We discussed the potential benefits including identifying early colorectal cancer.  She is aware of this but declined today. ?

## 2021-08-14 NOTE — Patient Instructions (Signed)
It was wonderful to see you today. ? ?Please bring ALL of your medications with you to every visit.  ? ?Today we talked about: ? ? ?- Think about taking a statin  ? ?-- Use a foot pad for support on your right side ? ?CALL ME IF YOUR FOOT SYMPTOMS CHANGE ? ? ? ?CONGRATULATIONS ON YOUR A1C!!!  ? ?Thank you for choosing [facility].  ? ?Please call [phone] with any questions about today's appointment. ? ?Please be sure to schedule follow up at the front  desk before you leave today.  ? ?Terisa Starr, MD  ?Family Medicine  ? ?

## 2021-08-26 ENCOUNTER — Encounter: Payer: Self-pay | Admitting: Family Medicine

## 2021-08-28 NOTE — Telephone Encounter (Signed)
I was unable to find the prescription for Carmen [name] in your box.  Has someone already signed it? ?TM

## 2021-09-09 ENCOUNTER — Encounter: Payer: Self-pay | Admitting: Family Medicine

## 2021-09-13 ENCOUNTER — Encounter: Payer: Self-pay | Admitting: Family Medicine

## 2021-09-14 ENCOUNTER — Encounter: Payer: Self-pay | Admitting: Family Medicine

## 2021-09-14 MED ORDER — BD PEN NEEDLE MICRO U/F 32G X 6 MM MISC: 1 refills | Status: AC

## 2021-09-14 MED ORDER — SEMAGLUTIDE(0.25 OR 0.5MG/DOS) 2 MG/1.5ML ~~LOC~~ SOPN: 0.5000 mg | PEN_INJECTOR | SUBCUTANEOUS | 3 refills | Status: DC

## 2021-09-14 MED ORDER — OZEMPIC (1 MG/DOSE) 4 MG/3ML ~~LOC~~ SOPN
1.0000 mg | PEN_INJECTOR | SUBCUTANEOUS | 3 refills | Status: DC
Start: 2021-09-14 — End: 2023-05-23

## 2021-09-14 NOTE — Telephone Encounter (Signed)
Responded to Mychart in prior thread. Sent Ozempic to [facility] with pen needles.  ?

## 2021-09-14 NOTE — Addendum Note (Signed)
Addended byManson Passey, Isaia Hassell on: 09/14/2021 12:26 PM ? ? Modules accepted: Orders ? ?

## 2021-09-23 ENCOUNTER — Telehealth: Payer: Self-pay | Admitting: Pharmacist

## 2021-09-23 ENCOUNTER — Encounter: Payer: Self-pay | Admitting: Pharmacist

## 2021-09-23 ENCOUNTER — Telehealth: Payer: Self-pay

## 2021-09-23 NOTE — Telephone Encounter (Signed)
Faxed dose increase of ozempic 1mg  to [facility]. ?

## 2021-09-23 NOTE — Telephone Encounter (Signed)
Called patient and left HIPAA compliant message RE "medication supply ready for pick-up".   ? ?I also sent her a my chart message:   ?Ozempic 0.5mg  pens have been delivered for you at the [facility].  ?They are labeled with your name and are ready for pick-up at your convenience.   ? ?I recognized that you are currently taking the 1mg  dose.  I have started the paperwork for you to get 1mg  pens from the manufacturer.   ? ?At this time, please take two shots of the 0.5mg  pens ONCE WEEKLY.  ? ? ?Medication Samples have been prepared for patient pick-up (placed in refrigerator) ? ?Drug name: Ozempic (semaglutide)       Strength: 0.25/0.5mg         Qty: 5 pens  LOT:  Exp.Date: 07/08/2023 ? ?Dosing instructions: 2 shots of the 0.5mg  pens ONCE WEEKLY ? ?The patient has been instructed regarding the correct time, dose, and frequency of taking this medication, including desired effects and most common side effects.  ? ?[id number] ?11:24 AM ?09/23/2021 ? ?

## 2021-09-26 ENCOUNTER — Encounter: Payer: Self-pay | Admitting: Family Medicine

## 2021-09-26 DIAGNOSIS — E119 Type 2 diabetes mellitus without complications: Secondary | ICD-10-CM

## 2021-09-28 MED ORDER — GABAPENTIN 300 MG PO CAPS: 600.0000 mg | ORAL_CAPSULE | Freq: Every day | ORAL | 3 refills | Status: DC

## 2021-10-06 ENCOUNTER — Telehealth: Payer: Self-pay

## 2021-10-06 NOTE — Telephone Encounter (Signed)
Informed pt her ozempic 1mg  pens are ready for pickup. ? ?Pt says she has pens left at home & is currently still taking 0.5mg  once a week for now although she was told to increase. Asked for to hold on to these pens for now and she'll get them at some point. ? ?4 boxes of ozempic are labeled and ready in med room fridge. ?

## 2021-10-19 ENCOUNTER — Other Ambulatory Visit: Payer: Self-pay

## 2021-10-19 DIAGNOSIS — I10 Essential (primary) hypertension: Secondary | ICD-10-CM

## 2021-10-19 MED ORDER — LOSARTAN POTASSIUM 100 MG PO TABS: 100.0000 mg | ORAL_TABLET | Freq: Every day | ORAL | 3 refills | Status: DC

## 2021-10-23 ENCOUNTER — Telehealth: Payer: Self-pay | Admitting: Pharmacist

## 2021-10-23 DIAGNOSIS — I10 Essential (primary) hypertension: Secondary | ICD-10-CM

## 2021-10-23 MED ORDER — LOSARTAN POTASSIUM 100 MG PO TABS: 100.0000 mg | ORAL_TABLET | Freq: Every day | ORAL | 3 refills | Status: DC

## 2021-10-23 NOTE — Telephone Encounter (Signed)
Patient contacted me and stated she did not yet have a refill authorized for her Losartan.   I agreed to assist.  She was thankful for the offer to help.    Upon chart review it appears the refill for losartan 100mg  was sent to the [facility] in [place] on 5/15.   I attempted to call patient back and shared that we would try to resend.   I asked her to contact the pharmacy after 9:00 AM and let us know if they had not received.

## 2021-11-09 ENCOUNTER — Ambulatory Visit (INDEPENDENT_AMBULATORY_CARE_PROVIDER_SITE_OTHER): Payer: Self-pay | Admitting: Family Medicine

## 2021-11-09 ENCOUNTER — Encounter: Payer: Self-pay | Admitting: Family Medicine

## 2021-11-09 VITALS — BP 132/80 | HR 95 | Wt 184.0 lb

## 2021-11-09 DIAGNOSIS — E1149 Type 2 diabetes mellitus with other diabetic neurological complication: Secondary | ICD-10-CM

## 2021-11-09 DIAGNOSIS — E038 Other specified hypothyroidism: Secondary | ICD-10-CM

## 2021-11-09 DIAGNOSIS — I10 Essential (primary) hypertension: Secondary | ICD-10-CM

## 2021-11-09 LAB — POCT GLYCOSYLATED HEMOGLOBIN (HGB A1C): HbA1c, POC (controlled diabetic range): 6.7 % (ref 0.0–7.0)

## 2021-11-09 NOTE — Progress Notes (Signed)
    SUBJECTIVE:   CHIEF COMPLAINT: diabetes  HPI:   Carmen [name] is a 61 y.o.  with history notable for type 2 diabetes, hypertension, and obesity  presenting for follow up.  She reports she is doing well. Has had some stressors with grandchild acting out.   She reports compliance with Ozemptic. Intermittently will have abdominal pain with this. No nausea or vomiting.  She has been compliant with her other medications. No need for refills today.     PERTINENT  PMH / PSH/Family/Social History : updated and reviewed as appropriate 2  OBJECTIVE:   BP 132/80   Pulse 95   Wt 184 lb (83.5 kg)   LMP 07/15/2015 (Exact Date)   SpO2 99%   BMI 32.59 kg/m   Today's weight:  Last Weight  Most recent update: 11/09/2021  1:30 PM    Weight  83.5 kg (184 lb)            Review of prior weights: Filed Weights   11/09/21 1330  Weight: 184 lb (83.5 kg)     Cardiac: Regular rate and rhythm. Normal S1/S2. No murmurs, rubs, or gallops appreciated. Lungs: Clear bilaterally to ascultation.  Abdomen: Normoactive bowel sounds. No tenderness to deep or light palpation. No rebound or guarding.  No lesions.  Psych: Pleasant and appropriate  Sensate on monofilament  ASSESSMENT/PLAN:   Primary hypertension At goal, continue current therapy.   Type 2 diabetes mellitus with neurological complications (HCC) Congratulated on A1C, weight loss. Continue current therapy.   Referred to [facility] for eye exam   Subclinical hypothyroidism, asymptomatic, TSH with reflex today.     Terisa Starr, MD  Family Medicine Teaching Service  [facility]

## 2021-11-09 NOTE — Assessment & Plan Note (Signed)
Congratulated on A1C, weight loss. Continue current therapy.

## 2021-11-09 NOTE — Patient Instructions (Addendum)
It was wonderful to see you today.  Please bring ALL of your medications with you to every visit.   Today we talked about:  --Your A1C today is great  - Keep an eye on your abdominal pain  - I will message you about [facility] coverage for your eye exam etc  Follow up in 3 months    Thank you for choosing [facility].   Please call [phone] with any questions about today's appointment.  Please be sure to schedule follow up at the front  desk before you leave today.   Dorris Singh, MD  Family Medicine

## 2021-11-09 NOTE — Assessment & Plan Note (Signed)
At goal, continue current therapy  

## 2021-11-10 ENCOUNTER — Telehealth: Payer: Self-pay | Admitting: Family Medicine

## 2021-11-10 ENCOUNTER — Encounter: Payer: Self-pay | Admitting: *Deleted

## 2021-11-10 DIAGNOSIS — Z1231 Encounter for screening mammogram for malignant neoplasm of breast: Secondary | ICD-10-CM

## 2021-11-10 LAB — TSH RFX ON ABNORMAL TO FREE T4: TSH: 4.63 u[IU]/mL — ABNORMAL HIGH (ref 0.450–4.500)

## 2021-11-10 LAB — T4F: T4,Free (Direct): 0.98 ng/dL (ref 0.82–1.77)

## 2021-11-10 NOTE — Telephone Encounter (Signed)
Called with results. TSH still mildly elevated, repeat in 3 months. Discussed mammogram, declined.   Carmen Singh, MD  Family Medicine Teaching Service

## 2021-12-04 ENCOUNTER — Encounter: Payer: Self-pay | Admitting: Family Medicine

## 2021-12-04 ENCOUNTER — Ambulatory Visit (INDEPENDENT_AMBULATORY_CARE_PROVIDER_SITE_OTHER): Payer: Self-pay | Admitting: Family Medicine

## 2021-12-04 ENCOUNTER — Other Ambulatory Visit (HOSPITAL_COMMUNITY)
Admission: RE | Admit: 2021-12-04 | Discharge: 2021-12-04 | Disposition: A | Discharge status: Home / Self Care | Payer: Self-pay | Source: Ambulatory Visit | Attending: Family Medicine | Admitting: Family Medicine

## 2021-12-04 VITALS — BP 125/67 | HR 86 | Wt 186.2 lb

## 2021-12-04 DIAGNOSIS — T466X5A Adverse effect of antihyperlipidemic and antiarteriosclerotic drugs, initial encounter: Secondary | ICD-10-CM

## 2021-12-04 DIAGNOSIS — E1149 Type 2 diabetes mellitus with other diabetic neurological complication: Secondary | ICD-10-CM

## 2021-12-04 DIAGNOSIS — M791 Myalgia, unspecified site: Secondary | ICD-10-CM

## 2021-12-04 DIAGNOSIS — G720 Drug-induced myopathy: Secondary | ICD-10-CM

## 2021-12-04 DIAGNOSIS — N898 Other specified noninflammatory disorders of vagina: Secondary | ICD-10-CM | POA: Insufficient documentation

## 2021-12-04 DIAGNOSIS — Z23 Encounter for immunization: Secondary | ICD-10-CM

## 2021-12-04 DIAGNOSIS — R1013 Epigastric pain: Secondary | ICD-10-CM

## 2021-12-04 LAB — POCT WET PREP (WET MOUNT)
Clue Cells Wet Prep Whiff POC: NEGATIVE
Trichomonas Wet Prep HPF POC: ABSENT

## 2021-12-04 MED ORDER — TETANUS-DIPHTH-ACELL PERTUSSIS 5-2.5-18.5 LF-MCG/0.5 IM SUSY: 0.5000 mL | PREFILLED_SYRINGE | Freq: Once | INTRAMUSCULAR | 0 refills | Status: AC

## 2021-12-04 MED ORDER — SHINGRIX 50 MCG/0.5ML IM SUSR: 0.5000 mL | INTRAMUSCULAR | 0 refills | Status: AC

## 2021-12-04 MED ORDER — FAMOTIDINE 20 MG PO TABS: 20.0000 mg | ORAL_TABLET | Freq: Two times a day (BID) | ORAL | 2 refills | Status: DC

## 2021-12-04 NOTE — Patient Instructions (Signed)
It was wonderful to see you today.  Please bring ALL of your medications with you to every visit.   Today we talked about:  ---Try not to bed your elbow in excess  -- You can use Pepcid or Nexium as needed for heartburn---if you are going to have a really spicey meal, think about using 30 minutes beforehand  - You are due for a Shingles and Tetanus vaccine--these are available at your pharmacy  - I will call you with results of your pelvic   Please follow up in 2 months   Thank you for choosing [facility].   Please call [phone] with any questions about today's appointment.  Please be sure to schedule follow up at the front  desk before you leave today.   Terisa Starr, MD  Family Medicine

## 2021-12-04 NOTE — Progress Notes (Deleted)
    SUBJECTIVE:   CHIEF COMPLAINT: *** HPI:   Carmen [name] is a 61 y.o.  with history notable for *** presenting for ***.   PERTINENT  PMH / PSH/Family/Social History : ***  OBJECTIVE:   LMP 07/15/2015 (Exact Date)   Today's weight:  Review of prior weights: There were no vitals filed for this visit.  ***  ASSESSMENT/PLAN:   No problem-specific Assessment & Plan notes found for this encounter.     {    This will disappear when note is signed, click to select method of visit    :1}  Terisa Starr, MD  Family Medicine Teaching Service  [facility]

## 2021-12-04 NOTE — Progress Notes (Addendum)
SUBJECTIVE:   CHIEF COMPLAINT / HPI:   Carmen [name] is a 61 y.o. F who presents today in follow-up. Main concerns include worry about possible yeast infection, acid reflux, tingling in arm and medial two fingers.  Vaginal Discharge The patient is concerned about a yeast infection, given that she had resistant yeast infection from around 01/2021 into 06/2021. She reports some yellow vaginal discharge and increased "wetness;" she denies malodor. She reports a slight burning sensation on the "top outside" of her vagina. No burning with urination. Some days she feels she has to pee more, but relates this to DM. No fever, sweats, chills. Occasional "sick feeling" or overheated; keeps the house warmer b/c of living w mother.  Acid Reflux She reports a "read bad" burning sensation intermittently in her upper abdomen/lower chest. Unable to tell how many episodes she's having a week vs a month. On Wed 06/28 had acid reflux after eating burger w onion, tomato.  She reports intermittent episodes of constant belching. Wondering about Ozempic causing this as she has heard from a friend that Ozempic might cause stomach issues. Taking Nexium, Prilosec, Tums as needed when episodes occur.   Ulnar Neuropathy She reports occasional tingling sensation in her left arm, beginning in her elbow and travelling down to her medial two fingers. She reports often flexing her arm when talking on the phone.   PERTINENT  PMH / PSH: HTN, DM2  OBJECTIVE:   BP 125/67   Pulse 86   Wt 186 lb 3.2 oz (84.5 kg)   LMP 07/15/2015 (Exact Date)   SpO2 100%   BMI 32.98 kg/m   General: Patient is pleasant, well-appearing and seated comfortably in chair. No acute distress. Cardiovascular: RRR, no murmurs, rubs, gallops. Radial, DP pulses 2+ bilaterally. Pulmonary: Normal work of breathing. Lungs clear to auscultation bilaterally. Pelvic:  Neuro: A&Ox3 MSK: Normal patellar refluxes bilaterally. Psych: Normal  affect. GU Exam:    External exam: Normal-appearing female external genitalia.  Vaginal exam notable for normal appearing discharge.  Cervix without discharge or obvious lesion. Chaperoned examine, CMA D Blount.    ASSESSMENT/PLAN:   Vaginal Discharge Pt reports increased "wetness" and vaginal discharge. On physical exam, there is some mildly malodorous discharge present but otherwise largely benign pelvic exam. - Wet prep, cervicovaginal testing done in clinic today; will call patient with results - Further management pending results   Acid Reflux Patient describes intermittent episodes of burning sensation in upper abdomen/lower chest, with at least one episode occurring after eating acidic foods. Symptoms improved with Tums and Prilosec/Nexium. Presentation consistent with acid reflux, no pain with exertion or dyspnea, triggered by onions, less likely represents an angina equivalent.  - Encouraged patient to continue using Tums as needed for symptoms. - Prescribed Pepcid for further management of symptoms. - Pt can consider using medications 30 mint prior to a spicy meal/a meal that would exacerbate symptoms  Ulnar Neuropathy Patient reports tingling sensation beginning in L elbow and travelling down to medial two fingers. This is consistent with ulnar neuropathy.  - Encouraged patient to avoid motions/positions that would exacerbate symptoms, such as excessively bending elbow. - If symptoms worsen/persist, can consider prescribing a night brace for further management.  Health maintenance - Order placed for Tdap booster, Shingrix vaccine.  Governor Rooks, Medical Student [facility]   Type 2 diabetes mellitus with neurological complications [facility]) Discussed statin, information provided, declined due to history of myalgias.   Patient seen along with MS3  student Maren Wiesen. I personally evaluated this patient along with the student, and verified all aspects of the  history, physical exam, and medical decision making as documented by the student. I agree with the student's documentation and have made all necessary edits.

## 2021-12-04 NOTE — Progress Notes (Signed)
Patient seen and examined with student doctor Nemecek. Edits within note.   Terisa Starr, MD  Family Medicine Teaching Service

## 2021-12-04 NOTE — Assessment & Plan Note (Signed)
Discussed statin, information provided, declined due to history of myalgias.

## 2021-12-07 ENCOUNTER — Encounter: Payer: Self-pay | Admitting: Family Medicine

## 2021-12-07 LAB — CERVICOVAGINAL ANCILLARY ONLY
Candida Glabrata: NEGATIVE
Candida Vaginitis: NEGATIVE
Chlamydia: NEGATIVE
Comment: NEGATIVE
Comment: NEGATIVE
Comment: NEGATIVE
Comment: NORMAL
Neisseria Gonorrhea: NEGATIVE

## 2022-01-01 ENCOUNTER — Other Ambulatory Visit: Payer: Self-pay

## 2022-01-01 DIAGNOSIS — I10 Essential (primary) hypertension: Secondary | ICD-10-CM

## 2022-01-01 MED ORDER — AMLODIPINE BESYLATE 10 MG PO TABS: 10.0000 mg | ORAL_TABLET | Freq: Every day | ORAL | 3 refills | Status: DC

## 2022-01-01 MED ORDER — CARVEDILOL 25 MG PO TABS
25.0000 mg | ORAL_TABLET | Freq: Two times a day (BID) | ORAL | 3 refills | Status: DC
Start: 2022-01-01 — End: 2023-01-13

## 2022-01-20 ENCOUNTER — Telehealth: Payer: Self-pay | Admitting: Pharmacist

## 2022-01-20 NOTE — Telephone Encounter (Signed)
Called patient to notify of arrival - Medication Assistance Supply from manufacturer for:   Ozempic (semaglutide) 4mg /87ml - 4 pens  Patient aware that she needs to come by to pick up medication which has been labeled and placed in the refrigerator.   She reports not checking frequently but almost all of her blood sugar readings are in the low 100s.   She thanked me for the call.  Last seen by PCP 12/04/2021 Next PCP appt not scheduled at this time.

## 2022-01-21 NOTE — Telephone Encounter (Signed)
Patient presents to clinic for medication. Provided with medication per note from Dr. Raymondo Band.   Veronda Prude, RN

## 2022-04-05 ENCOUNTER — Encounter: Payer: Self-pay | Admitting: Family Medicine

## 2022-04-05 ENCOUNTER — Ambulatory Visit (INDEPENDENT_AMBULATORY_CARE_PROVIDER_SITE_OTHER): Payer: Self-pay | Admitting: Family Medicine

## 2022-04-05 VITALS — BP 122/65 | HR 70 | Ht 63.0 in | Wt 190.0 lb

## 2022-04-05 DIAGNOSIS — E1149 Type 2 diabetes mellitus with other diabetic neurological complication: Secondary | ICD-10-CM

## 2022-04-05 DIAGNOSIS — R7989 Other specified abnormal findings of blood chemistry: Secondary | ICD-10-CM

## 2022-04-05 DIAGNOSIS — E11319 Type 2 diabetes mellitus with unspecified diabetic retinopathy without macular edema: Secondary | ICD-10-CM

## 2022-04-05 DIAGNOSIS — E119 Type 2 diabetes mellitus without complications: Secondary | ICD-10-CM

## 2022-04-05 DIAGNOSIS — I10 Essential (primary) hypertension: Secondary | ICD-10-CM

## 2022-04-05 DIAGNOSIS — K529 Noninfective gastroenteritis and colitis, unspecified: Secondary | ICD-10-CM

## 2022-04-05 LAB — POCT GLYCOSYLATED HEMOGLOBIN (HGB A1C): HbA1c, POC (controlled diabetic range): 5.8 % (ref 0.0–7.0)

## 2022-04-05 MED ORDER — SPIRONOLACTONE 50 MG PO TABS: 50.0000 mg | ORAL_TABLET | Freq: Every day | ORAL | 3 refills | Status: DC

## 2022-04-05 MED ORDER — GABAPENTIN 300 MG PO CAPS: 600.0000 mg | ORAL_CAPSULE | Freq: Every day | ORAL | 3 refills | Status: DC

## 2022-04-05 MED ORDER — ONDANSETRON 4 MG PO TBDP: 4.0000 mg | ORAL_TABLET | Freq: Three times a day (TID) | ORAL | 0 refills | Status: DC | PRN
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
## 2022-04-05 NOTE — Assessment & Plan Note (Signed)
Patient to have records faxed

## 2022-04-05 NOTE — Patient Instructions (Addendum)
It was wonderful to see you today.  Please bring ALL of your medications with you to every visit.   Today we talked about:  CONGRATULATIONS ON YOUR A1C  Please follow up in 3 months   I will message you with results  Please have your eye doctor send results to:   [phone]   Please follow up in 3 months   Thank you for choosing [facility].   Please call [phone] with any questions about today's appointment.  Please be sure to schedule follow up at the front  desk before you leave today.   Dorris Singh, MD  Family Medicine

## 2022-04-05 NOTE — Addendum Note (Signed)
Addended by: Owens Shark, Ayyan Sites on: 04/05/2022 02:11 PM   Modules accepted: Orders

## 2022-04-05 NOTE — Assessment & Plan Note (Signed)
Metabolic panel today BP at goal

## 2022-04-05 NOTE — Assessment & Plan Note (Addendum)
>>  ASSESSMENT AND PLAN FOR TYPE 2 DIABETES MELLITUS WITH NEUROLOGICAL COMPLICATIONS (HCC) WRITTEN ON 04/05/2022  1:11 PM BY Cleatus Goodin M, MD  A1C at goal Continue current therapy Not on a statin due to intolerance    >>ASSESSMENT AND PLAN FOR DIABETIC RETINOPATHY (HCC) WRITTEN ON 04/05/2022  1:12 PM BY Timmia Cogburn M, MD  Patient to have records faxed

## 2022-04-06 ENCOUNTER — Telehealth: Payer: Self-pay | Admitting: Family Medicine

## 2022-04-06 ENCOUNTER — Encounter: Payer: Self-pay | Admitting: Family Medicine

## 2022-04-06 LAB — MICROALBUMIN / CREATININE URINE RATIO
Creatinine, Urine: 303.1 mg/dL
Microalb/Creat Ratio: 65 mg/g creat — ABNORMAL HIGH (ref 0–29)
Microalbumin, Urine: 196.1 ug/mL

## 2022-04-06 LAB — TSH RFX ON ABNORMAL TO FREE T4: TSH: 2 u[IU]/mL (ref 0.450–4.500)

## 2022-04-06 LAB — COMPREHENSIVE METABOLIC PANEL
ALT: 26 IU/L (ref 0–32)
AST: 26 IU/L (ref 0–40)
Albumin/Globulin Ratio: 1.7 (ref 1.2–2.2)
Albumin: 4.4 g/dL (ref 3.8–4.9)
Alkaline Phosphatase: 90 IU/L (ref 44–121)
BUN/Creatinine Ratio: 16 (ref 9–23)
BUN: 24 mg/dL (ref 6–24)
Bilirubin Total: 0.8 mg/dL (ref 0.0–1.2)
CO2: 19 mmol/L — ABNORMAL LOW (ref 20–29)
Calcium: 8.8 mg/dL (ref 8.7–10.2)
Chloride: 98 mmol/L (ref 96–106)
Creatinine, Ser: 1.49 mg/dL — ABNORMAL HIGH (ref 0.57–1.00)
Globulin, Total: 2.6 g/dL (ref 1.5–4.5)
Glucose: 144 mg/dL — ABNORMAL HIGH (ref 70–99)
Potassium: 4.6 mmol/L (ref 3.5–5.2)
Sodium: 134 mmol/L (ref 134–144)
Total Protein: 7 g/dL (ref 6.0–8.5)
eGFR: 40 mL/min/{1.73_m2} — ABNORMAL LOW (ref >59)

## 2022-04-06 NOTE — Telephone Encounter (Signed)
Attempted to call patient. Reached voicemail, left generic voicemail to call back.   If patient calls back please ask her to HOLD Spironolactone, HCTZ, and Losartan for 3 days then can restart when she is feeling better. Should have repeat labs in 1 week.  Dorris Singh, MD  Family Medicine Teaching Service

## 2022-04-08 LAB — HM DIABETES EYE EXAM

## 2022-04-09 ENCOUNTER — Other Ambulatory Visit: Payer: Self-pay | Admitting: Family Medicine

## 2022-04-09 DIAGNOSIS — I10 Essential (primary) hypertension: Secondary | ICD-10-CM

## 2022-04-12 ENCOUNTER — Other Ambulatory Visit: Payer: Self-pay

## 2022-04-12 DIAGNOSIS — I10 Essential (primary) hypertension: Secondary | ICD-10-CM

## 2022-04-13 ENCOUNTER — Telehealth: Payer: Self-pay | Admitting: Family Medicine

## 2022-04-13 LAB — BASIC METABOLIC PANEL
BUN/Creatinine Ratio: 17 (ref 9–23)
BUN: 23 mg/dL (ref 6–24)
CO2: 19 mmol/L — ABNORMAL LOW (ref 20–29)
Calcium: 8.3 mg/dL — ABNORMAL LOW (ref 8.7–10.2)
Chloride: 102 mmol/L (ref 96–106)
Creatinine, Ser: 1.39 mg/dL — ABNORMAL HIGH (ref 0.57–1.00)
Glucose: 131 mg/dL — ABNORMAL HIGH (ref 70–99)
Potassium: 4.3 mmol/L (ref 3.5–5.2)
Sodium: 137 mmol/L (ref 134–144)
eGFR: 44 mL/min/{1.73_m2} — ABNORMAL LOW (ref >59)

## 2022-04-13 NOTE — Telephone Encounter (Signed)
Attempted to call patient. Reached voicemail, left generic voicemail to call back.   If calls back, please schedule follow up visit with me. Please confirm if she is taking  (or not taking) Spironolactone, HCTZ, and losartan.  Dorris Singh, MD  Family Medicine Teaching Service

## 2022-04-13 NOTE — Telephone Encounter (Signed)
Patient returns call to nurse line.   She reports taking all 3 medications.   Patient scheduled for 11/28 with PCP.

## 2022-04-13 NOTE — Addendum Note (Signed)
Addended by: Owens Shark, Ellamae Lybeck on: 04/13/2022 04:21 PM   Modules accepted: Orders

## 2022-04-13 NOTE — Telephone Encounter (Signed)
Called patient with results. Discussed AKI. Not taking NSAIDs. Stop Spironolactone to see if resolves. Repeat on Monday.   Dorris Singh, MD  Family Medicine Teaching Service

## 2022-04-15 ENCOUNTER — Other Ambulatory Visit: Payer: Self-pay | Admitting: Family Medicine

## 2022-04-15 DIAGNOSIS — I10 Essential (primary) hypertension: Secondary | ICD-10-CM

## 2022-04-19 ENCOUNTER — Other Ambulatory Visit: Payer: Self-pay

## 2022-04-19 DIAGNOSIS — I10 Essential (primary) hypertension: Secondary | ICD-10-CM

## 2022-04-20 LAB — BASIC METABOLIC PANEL
BUN/Creatinine Ratio: 18 (ref 12–28)
BUN: 21 mg/dL (ref 8–27)
CO2: 19 mmol/L — ABNORMAL LOW (ref 20–29)
Calcium: 8.6 mg/dL — ABNORMAL LOW (ref 8.7–10.3)
Chloride: 100 mmol/L (ref 96–106)
Creatinine, Ser: 1.18 mg/dL — ABNORMAL HIGH (ref 0.57–1.00)
Glucose: 149 mg/dL — ABNORMAL HIGH (ref 70–99)
Potassium: 4.7 mmol/L (ref 3.5–5.2)
Sodium: 137 mmol/L (ref 134–144)
eGFR: 53 mL/min/{1.73_m2} — ABNORMAL LOW (ref >59)

## 2022-05-04 ENCOUNTER — Ambulatory Visit (INDEPENDENT_AMBULATORY_CARE_PROVIDER_SITE_OTHER): Payer: Self-pay | Admitting: Family Medicine

## 2022-05-04 ENCOUNTER — Encounter: Payer: Self-pay | Admitting: Family Medicine

## 2022-05-04 VITALS — BP 118/80 | HR 90 | Ht 63.0 in | Wt 186.0 lb

## 2022-05-04 DIAGNOSIS — M792 Neuralgia and neuritis, unspecified: Secondary | ICD-10-CM

## 2022-05-04 DIAGNOSIS — R6889 Other general symptoms and signs: Secondary | ICD-10-CM

## 2022-05-04 DIAGNOSIS — F331 Major depressive disorder, recurrent, moderate: Secondary | ICD-10-CM

## 2022-05-04 DIAGNOSIS — I10 Essential (primary) hypertension: Secondary | ICD-10-CM

## 2022-05-04 DIAGNOSIS — E1149 Type 2 diabetes mellitus with other diabetic neurological complication: Secondary | ICD-10-CM

## 2022-05-04 NOTE — Progress Notes (Signed)
    SUBJECTIVE:   CHIEF COMPLAINT: follow up AKI HPI:   Carmen [name] is a 61 y.o.  with history notable for type 2 diabetes, hypertension, and statin intolerance presenting for follow up after recent GI illness that resulted in dehydration and AKI.   Patient reports overall she is doing well.  There were some stressors over the holidays related to transportation and family stress.  She reports her son recently started a job and this is a stressor.  She is also primarily responsible now for her grandchild Ephriam Knuckles as well as transporting multiple family members to their jobs.  She was denied [place] financial assistance because she did not submit all the paperwork.  Creatinine Elevation The patient has been off of her spironolactone. BP today is within goal. Taking all other medications as prescribed. Denies headaches, vision changes chest pain.   Type 2 DM  Pleased with weight change on Ozempic. Denies side effects. Not interested in trialing another statin at this time. Has had severe myalagias before with this.      PERTINENT  PMH / PSH/Family/Social History : HLD, type 2 DM with retinopathy   OBJECTIVE:   BP 118/80   Pulse 90   Ht 5\' 3"  (1.6 m)   Wt 186 lb (84.4 kg)   LMP 07/15/2015 (Exact Date)   SpO2 99%   BMI 32.95 kg/m   Today's weight:  Last Weight  Most recent update: 05/04/2022 10:00 AM    Weight  84.4 kg (186 lb)            Review of prior weights: Filed Weights   05/04/22 0959  Weight: 186 lb (84.4 kg)     Cardiac: Regular rate and rhythm. Normal S1/S2. No murmurs, rubs, or gallops appreciated. Lungs: Clear bilaterally to ascultation.  Abdomen: Normoactive bowel sounds. No tenderness to deep or light palpation. No rebound or guarding.  Psych: Pleasant and appropriate    ASSESSMENT/PLAN:   Primary hypertension Repeat BMP today. Continue current therapy--as BP at goal, will keep her off spironolactone.   Type 2 diabetes mellitus with  neurological complications (HCC) A1C at goal--repeat in March 2024. Continue current medications.   Neuropathic pain Well controlled on gabapentin    Recent acute kidney injury BMP today Discussed avoiding NSAIDS  Consider renal ultrasound if not improved    Financial stress Medicaid application printed and given to patient   HCM Declined COVID and flu   At follow up discuss statin again     April 2024, MD  Family Medicine Teaching Service  [facility]

## 2022-05-04 NOTE — Patient Instructions (Signed)
It was wonderful to see you today.  Please bring ALL of your medications with you to every visit.   Today we talked about:   Applying for medicaid  We will check blood work  YOU ARE DOING GREAT WORK FOR YOUR HEALTH  Think about taking a cholesterol pill again  I will call or message you with results   I will see you in March      Medicaid is expanding as of May 07, 2022.  To apply: Go online to [email or web address]  OR  Call your local Department of Social Services(DSS) and complete a telephone application. Phone: [phone] OR  Lenox Ahr out a paper application and either mail, fax, email or drop off the application to your local DSS office. Addresses are below  OR  Apply in person at your local Department of Social Services (DSS) office. Addresses are below   Where to [employer] your Application:   [facility]  [address], [place], [place] [address]  [facility] [address] [place], [place] [address] ?  You should be eligible for Medicaid if:   You live in [place] You are ages 59 through 79 You are a citizen (some non-US citizens can also get health care coverage through [facility]). And if your household income falls within the chart below:   sehold size Total income, before taxes  Single Adults $1,676/month or less     ($20,120/year)  Family of 2 $2,267/month or less      ($27,214/year)  Family of 3 $2,859/month or less  ($34,307/year)  Family of 4 $3,450/month or less  ($41,400/year)  Each additional person add $591/month (add $7,094/year)   Information you will need:  Full legal name Date of birth Social Security number (or immigration documents) [employer] residency Income information (from [place], W-2 forms, tax returns or business records)   Please follow up in 4 months   Thank you for choosing [facility].   Please call [phone] with any questions about today's appointment.  Please be sure to  schedule follow up at the front  desk before you leave today.   Terisa Starr, MD  Family Medicine

## 2022-05-04 NOTE — Assessment & Plan Note (Signed)
A1C at goal--repeat in March 2024. Continue current medications.

## 2022-05-04 NOTE — Assessment & Plan Note (Signed)
Repeat BMP today. Continue current therapy--as BP at goal, will keep her off spironolactone.

## 2022-05-04 NOTE — Assessment & Plan Note (Signed)
Well controlled on gabapentin

## 2022-05-05 ENCOUNTER — Encounter: Payer: Self-pay | Admitting: Family Medicine

## 2022-05-05 LAB — BASIC METABOLIC PANEL
BUN/Creatinine Ratio: 19 (ref 12–28)
BUN: 21 mg/dL (ref 8–27)
CO2: 21 mmol/L (ref 20–29)
Calcium: 8.3 mg/dL — ABNORMAL LOW (ref 8.7–10.3)
Chloride: 106 mmol/L (ref 96–106)
Creatinine, Ser: 1.13 mg/dL — ABNORMAL HIGH (ref 0.57–1.00)
Glucose: 154 mg/dL — ABNORMAL HIGH (ref 70–99)
Potassium: 4.2 mmol/L (ref 3.5–5.2)
Sodium: 141 mmol/L (ref 134–144)
eGFR: 56 mL/min/{1.73_m2} — ABNORMAL LOW (ref >59)

## 2022-05-05 LAB — TSH RFX ON ABNORMAL TO FREE T4: TSH: 3.63 u[IU]/mL (ref 0.450–4.500)

## 2022-06-02 ENCOUNTER — Other Ambulatory Visit: Payer: Self-pay | Admitting: *Deleted

## 2022-06-02 DIAGNOSIS — E119 Type 2 diabetes mellitus without complications: Secondary | ICD-10-CM

## 2022-06-02 MED ORDER — METFORMIN HCL ER 500 MG PO TB24: ORAL_TABLET | ORAL | 5 refills | Status: DC

## 2022-06-02 MED ORDER — HYDROCHLOROTHIAZIDE 12.5 MG PO TABS: 12.5000 mg | ORAL_TABLET | Freq: Every day | ORAL | 3 refills | Status: DC

## 2022-06-18 ENCOUNTER — Telehealth: Payer: Self-pay

## 2022-06-18 NOTE — Telephone Encounter (Signed)
Received notification from [place] regarding RE-ENROLLMENT approval for [place]. Patient assistance approved from 06/17/22 to 05/26/23.  MEDICATION WILL SHIP TO OFFICE.  Phone: [phone]

## 2022-07-01 ENCOUNTER — Telehealth: Payer: Self-pay | Admitting: Pharmacist

## 2022-07-01 NOTE — Telephone Encounter (Signed)
Patient called and reported that she received notification approval for Ozempic (semglutide).   Returned call to let patient know that Ozempic (semaglutide) was ready for pick-up.   Patient thanked me for call AND shared she plans to pick up 1/26 (tomorrow).   Medication labeled and placed in the refrigerator.  4 - Ozempic (Semaglutide) 1mg  pens.  Lot:  [id number]  Exp.  03/06/2005

## 2022-07-01 NOTE — Telephone Encounter (Signed)
Noted and agree. 

## 2022-07-02 ENCOUNTER — Telehealth: Payer: Self-pay | Admitting: Family Medicine

## 2022-07-08 ENCOUNTER — Ambulatory Visit (INDEPENDENT_AMBULATORY_CARE_PROVIDER_SITE_OTHER): Payer: Medicaid Other | Admitting: Family Medicine

## 2022-07-08 ENCOUNTER — Encounter: Payer: Self-pay | Admitting: Student

## 2022-07-08 ENCOUNTER — Ambulatory Visit (INDEPENDENT_AMBULATORY_CARE_PROVIDER_SITE_OTHER): Payer: Medicaid Other | Admitting: Student

## 2022-07-08 VITALS — BP 108/76 | HR 73 | Ht 63.0 in | Wt 183.8 lb

## 2022-07-08 VITALS — BP 135/80 | HR 90 | Ht 63.0 in | Wt 184.8 lb

## 2022-07-08 DIAGNOSIS — H6122 Impacted cerumen, left ear: Secondary | ICD-10-CM | POA: Diagnosis present

## 2022-07-08 DIAGNOSIS — R1032 Left lower quadrant pain: Secondary | ICD-10-CM | POA: Diagnosis present

## 2022-07-08 NOTE — Patient Instructions (Addendum)
It was wonderful to see you today.  Please bring ALL of your medications with you to every visit.   Today we talked about:  -You are overdue for a colonoscopy to screen for colon cancer. If you change your mind about this, please let us know. -I would recommend increased fiber in your diet, increased hydration, and some physical activity. -Linzess is likely going to be cost-prohibitive, unfortunately. -I would recommend Miralax. You can titrate up to effect. Start by taking it once a day, you can increase to twice a day.  Thank you for coming to your visit as scheduled. We have had a large "no-show" problem lately, and this significantly limits our ability to see and care for patients. As a friendly reminder- if you cannot make your appointment please call to cancel. We do have a no show policy for those who do not cancel within 24 hours. Our policy is that if you miss or fail to cancel an appointment within 24 hours, 3 times in a 69-month period, you may be dismissed from our clinic.   Thank you for choosing [facility].   Please call [phone] with any questions about today's appointment.  Please be sure to schedule follow up at the front  desk before you leave today.   Sharion Settler, DO PGY-3 Family Medicine

## 2022-07-08 NOTE — Progress Notes (Signed)
   SUBJECTIVE:   CHIEF COMPLAINT / HPI: Chief Complaint  Patient presents with   left ear possible qtip stuck    Carmen [name] is a 62 y.o. female with a past medical history of T2DM, HTN, HLD w/ statin intolerance, and AKI presenting to the clinic for possible Qtip stuck in left ear. It is her second appointment of the day and she forgot to discuss this issue earlier.  2 days ago, the patient felt an itch in her left ear and used a Qtip to scratch at it.  When she removed the Qtip, she noticed it was missing its cotton tip.  She could not find the tip anywhere in the room or around her house.  She then used the naked stick end of the aforementioned Qtip to try to pull out any retained cotton without success.  She is asymptomatic; her hearing is unaffected, she does not feel anything inside her ear.  She denies seeing blood or drainage from the ear and has not had any pain there.  She does use Qtips frequently for ear cleaning.  PERTINENT  PMH / PSH:  HLD, T2DM with retinopathy, HTN  Patient Care Team: Martyn Malay, MD as PCP - General (Family Medicine)  OBJECTIVE:   BP 135/80   Pulse 90   Ht 5\' 3"  (1.6 m)   Wt 184 lb 12.8 oz (83.8 kg)   LMP 07/15/2015 (Exact Date)   SpO2 99%   BMI 32.74 kg/m   General: Age-appropriate, resting comfortably in chair, NAD, alert and at baseline. HEENT:  Head: Normocephalic, atraumatic. No tenderness to percussion over sinuses. Eyes: No conjunctival erythema or scleral injections. Ears: L cerumen impaction obstructing TM prior to curette use.  Mild erythema of L external ear canal after curette use. TMs non-bulging and non-erythematous bilaterally.  No drainage, blood, or lesions internally. Neck: Supple. Oral/mouth: MMM. Skin: Warm and dry.  {Show previous vital signs (optional):23777}    ASSESSMENT/PLAN:   Cerumen debris on tympanic membrane of left ear No concern for OM, OE, retained foreign object, or perforated TM. Used  plastic curette to remove impacted cerumen, mild erythema of outer canal noted afterwards, likely 2/2 curette friction. Ear canal clear and TM visualized after curette use. - Provided education about avoiding Qtip use in ears - Recommended Debrox drops instead of Qtips - Return precautions if ear becomes painful, drainage appears, or blood is noticed   Eamonn Sermeno Mining engineer, [place]   I have evaluated this patient along with Aisea Bouldin, MS4 and reviewed the above note, making necessary revisions.  Pearla Dubonnet, MD 07/08/2022, 2:47 PM PGY-2, [place]

## 2022-07-08 NOTE — Progress Notes (Signed)
    SUBJECTIVE:   CHIEF COMPLAINT / HPI:   Carmen [name] is a 62 y.o. female who presents to the [facility] clinic today to discuss the following concerns:   Abdominal Pain Reports that she has had "stomach issues", was diagnosed with "chronic stomach disorder" and IBS in the past. She is s/p cholecystectomy for gallstones. She reports that when she gets stressed she feels that "the left side of my bowels get messed up". She is able to have bowel movements but feels that she cannot empty everything out. She does endorse recent stress in the last week.   She has been taking Dulcolax. She is not having to strain. She typically has a bowel movement once a day.   No N/V. No bloody stools. Does have decreased appetite from her Ozempic. She is not up to date on colon cancer screening but she declines this. She understands the risks of this. She states that if she were to have cancer she would rather not know about it.   Lab Results  Component Value Date   HGBA1C 5.8 04/05/2022   PERTINENT  PMH / PSH: Hypertension, type 2 diabetes, OA, MDD  OBJECTIVE:   BP 108/76   Pulse 73   Ht 5\' 3"  (1.6 m)   Wt 183 lb 12.8 oz (83.4 kg)   LMP 07/15/2015 (Exact Date)   SpO2 100%   BMI 32.56 kg/m    General: NAD, pleasant, able to participate in exam Respiratory: normal effort Abdomen: Bowel sounds present, nontender, nondistended, no hepatosplenomegaly. Psych: Normal affect and mood     07/08/2022   10:38 AM 05/04/2022   10:24 AM 04/05/2022   12:45 PM  Depression screen PHQ 2/9  Decreased Interest 0 0 0  Down, Depressed, Hopeless 3 0 0  PHQ - 2 Score 3 0 0  Altered sleeping 1 1 2   Tired, decreased energy 0 1 2  Change in appetite 0 0 2  Feeling bad or failure about yourself  0 0 0  Trouble concentrating 0 0 0  Moving slowly or fidgety/restless 0 0 0  Suicidal thoughts 0 0 0  PHQ-9 Score 4 2 6   Difficult doing work/chores Somewhat difficult Not difficult at all     ASSESSMENT/PLAN:    1. Left lower quadrant abdominal pain Possibly related to constipation/IBS. Discussed and recommended colonoscopy given that she is not up-to-date on colon cancer screening.  Patient was very clear that she would not want to know if she had cancer and does not want to proceed with this. Recommended symptomatic treatment for constipation. She has room to improve in terms of hydration, physical activity and improved dietary changes. She will try to make an effort to improve this.  -Miralax PRN, can titrate up as needed  -Considered Linzess but patient is uninsured and this would likely be cost-prohibitive -Discussed she may qualify for Medicaid but she states she did not "want to do all that paperwork"  Sharion Settler, [place]

## 2022-07-08 NOTE — Assessment & Plan Note (Addendum)
No concern for OM, OE, retained foreign object, or perforated TM. Used plastic curette to remove impacted cerumen, mild erythema of outer canal noted afterwards, likely 2/2 curette friction. Ear canal clear and TM visualized after curette use. - Provided education about avoiding Qtip use in ears - Recommended Debrox drops instead of Qtips - Return precautions if ear becomes painful, drainage appears, or blood is noticed

## 2022-07-12 ENCOUNTER — Ambulatory Visit (INDEPENDENT_AMBULATORY_CARE_PROVIDER_SITE_OTHER): Payer: Medicaid Other | Admitting: Family Medicine

## 2022-07-12 ENCOUNTER — Encounter: Payer: Self-pay | Admitting: Family Medicine

## 2022-07-12 VITALS — BP 119/80 | HR 98 | Temp 98.4°F | Ht 62.0 in | Wt 185.0 lb

## 2022-07-12 DIAGNOSIS — N898 Other specified noninflammatory disorders of vagina: Secondary | ICD-10-CM | POA: Diagnosis present

## 2022-07-12 LAB — POCT WET PREP (WET MOUNT)
Clue Cells Wet Prep Whiff POC: NEGATIVE
Trichomonas Wet Prep HPF POC: ABSENT

## 2022-07-12 NOTE — Progress Notes (Signed)
    SUBJECTIVE:   CHIEF COMPLAINT / HPI:   Carmen [name] is a 62 y.o. female who presents to the [facility] clinic today to discuss the following concerns:   Vaginal Discharge Has been having some yellow stains in her underwear. Ongoing for the last week or so. Not currently sexually active and denies STI testing. Reports having candida glabrata in the past and would like to be checked for this.  Last Pap smear 02/2021 NILM, negative for high risk HPV.  PERTINENT  PMH / PSH: Hypertension, type 2 diabetes, obesity, MDD  OBJECTIVE:   BP 119/80   Pulse 98   Temp 98.4 F (36.9 C)   Ht 5\' 2"  (1.575 m)   Wt 185 lb (83.9 kg)   LMP 07/15/2015 (Exact Date)   SpO2 95%   BMI 33.84 kg/m    General: NAD, pleasant, able to participate in exam Respiratory: normal effort GU: Normal appearance of labia majora and minora, without lesions. Vagina tissue pink, moist, without lesions or abrasions. Cervix normal appearance, non-friable, with scant clear discharge from os.  Psych: Normal affect and mood  GU exam chaperoned by Mercer Pod, CMA   ASSESSMENT/PLAN:   1. Vaginal discharge Testing for BV/yeast including candida glabrata. Will treat as appropriate. Examination was unremarkable. Declined STI testing. Pap smear is UTD.  - POCT Wet Prep [facility]): negative - Candida 6 Species Profile, NAA - Ct, Ng, Mycoplasmas NAA, Swab  Sharion Settler, DO [place]

## 2022-07-12 NOTE — Patient Instructions (Addendum)
It was wonderful to see you today.  Please bring ALL of your medications with you to every visit.   Today we talked about:  We are doing lab work today to check for bacterial vaginosis and yeast infections. I will send you a MyChart message if you have MyChart. Otherwise, I will give you a call for abnormal results or send a letter if everything returned back normal. If you don't hear from me in 2 weeks, please call the office.    Thank you for coming to your visit as scheduled. We have had a large "no-show" problem lately, and this significantly limits our ability to see and care for patients. As a friendly reminder- if you cannot make your appointment please call to cancel. We do have a no show policy for those who do not cancel within 24 hours. Our policy is that if you miss or fail to cancel an appointment within 24 hours, 3 times in a 42-month period, you may be dismissed from our clinic.   Thank you for choosing [facility].   Please call [phone] with any questions about today's appointment.  Please be sure to schedule follow up at the front  desk before you leave today.   Sharion Settler, DO PGY-3 Family Medicine

## 2022-07-15 LAB — CANDIDA 6 SPECIES PROFILE, NAA
C PARAPSILOSIS/TROPICALIS: NEGATIVE
Candida albicans, NAA: NEGATIVE
Candida glabrata, NAA: NEGATIVE
Candida krusei, NAA: NEGATIVE
Candida lusitaniae, NAA: NEGATIVE

## 2022-07-19 LAB — CT, NG, MYCOPLASMAS NAA, SWAB
Chlamydia trachomatis, NAA: NEGATIVE
Mycoplasma genitalium NAA: NEGATIVE
Mycoplasma hominis NAA: NEGATIVE
Neisseria gonorrhoeae, NAA: NEGATIVE
Ureaplasma spp NAA: NEGATIVE

## 2022-07-23 ENCOUNTER — Other Ambulatory Visit: Payer: Self-pay

## 2022-07-23 ENCOUNTER — Ambulatory Visit (INDEPENDENT_AMBULATORY_CARE_PROVIDER_SITE_OTHER): Payer: Medicaid Other | Admitting: Student

## 2022-07-23 ENCOUNTER — Encounter: Payer: Self-pay | Admitting: Student

## 2022-07-23 VITALS — BP 138/86 | HR 93 | Ht 62.0 in | Wt 185.0 lb

## 2022-07-23 DIAGNOSIS — K581 Irritable bowel syndrome with constipation: Secondary | ICD-10-CM | POA: Diagnosis present

## 2022-07-23 MED ORDER — ESCITALOPRAM OXALATE 10 MG PO TABS: 10.0000 mg | ORAL_TABLET | Freq: Every day | ORAL | 2 refills | Status: DC

## 2022-07-23 MED ORDER — SENNA 8.6 MG PO TABS: 1.0000 | ORAL_TABLET | Freq: Every day | ORAL | 0 refills | Status: DC

## 2022-07-23 NOTE — Patient Instructions (Signed)
I think that your symptoms are all due to your IBS and the stress you are under right now.   I think starting Lexapro is a great move for managing the stress and anxiety side of the brain-gut connection. I will let you and Dr. Owens Shark hash it out about Xanax.   Keep thinking about the colonoscopy--we will keep bothering you about it.  Add Senna daily to your MiraLAX to help things move along. One tablet a day should help here.  Marnee Guarneri, MD    MEDICATIONS Miralax 1 capful 1x a day mixed into 8 ounces of water      Record stools daily. Goal is 2 soft stools per day.  If having less than 2 stools or they are hard: increase Miralax to 1 capful 2x a day If having more than 2 stools or they are very runny: decrease Miralax to 1/2 capful 1x a day It takes time to find the perfect dose and you sometimes have to increase Miralax multiple times. You can safely increase Miralax to 3 capfuls twice a day. This process will take several months. 2. Senna 1 tablet daily until regular. Once regular, only take this if you have not have a stool in >2 days.  Mild cramping and loose stools may be expected the first few days, and when increasing dosages. Do not stop medications, adjust as needed.

## 2022-07-23 NOTE — Progress Notes (Unsigned)
    SUBJECTIVE:   CHIEF COMPLAINT / HPI:   CC: "When I get stressed, my bowels get messed up" Longstanding issue. Seen by GI at [facility] "many moons ago" and diagnosed with "chronic stomach disorder" and IBS. Has seen Dr. Nita Sells for the same issue ~1 month ago. Primary symptoms are abdominal pain and constipation. "I go, but I can't get empty." Longstanding issue with constipation with her known IBS, metformin was previously sufficient for keeping her regular but recent stressors seem to have caused issue. Taking Dulcolax and Mag citrate without success. No bloody BM, no vomiting.  Recently has taken a much larger role in the care of her grandson, also had to put her dog down. Is primary caregiver for her mother. Unemployed, feeling pressure to get a job, recently landed a job at [employer] but hasn't started yet--worried about new stress here.  Long discussion about the proposed mechanisms for IBS including elevated stress-related neuropeptides causing gastric distress via the enteric nervous symptom. This explanation seems to comport with her experience of her illness. Is open to trialing an SSRI for this purpose. She tells me that her friend is on Lexapro and Xanax for this purpose, wonders if these medications may help her too.  Not UTD on Colon CA screening, reaffirms today that if she were to have cancer she "wouldn't want to know about it."     OBJECTIVE:   BP 138/86   Pulse 93   Ht 5' 2"$  (1.575 m)   Wt 185 lb (83.9 kg)   LMP 07/15/2015 (Exact Date)   SpO2 100%   BMI 33.84 kg/m   Physical Exam Constitutional:      Comments: Anxious-appearing but in good spirits   HENT:     Mouth/Throat:     Mouth: Mucous membranes are moist.  Cardiovascular:     Rate and Rhythm: Normal rate and regular rhythm.     Heart sounds: No murmur heard. Pulmonary:     Effort: Pulmonary effort is normal. No respiratory distress.     Breath sounds: Normal breath sounds.  Abdominal:     General:  Abdomen is flat. There is no distension.     Palpations: Abdomen is soft.     Tenderness: There is no abdominal tenderness.  Psychiatric:     Comments: She is anxious      ASSESSMENT/PLAN:   Irritable bowel syndrome I do think that her IBS is the primary driver of her symptoms at present. Will initiate SSRI and schedule for 4 week follow-up with PCP. Explained my reasoning for wanting to avoid Xanax, she will re-visit with her PCP at follow-up.  Discussed that colonoscopy might be necessary to ultimately get to the bottom of her symptoms--she continues to decline.  - Initiate Lexapro 45m daily, can increase to 292mdaily at follow-up - 4 week return visit with PCP - Continue to urge colonoscopy      J BaPearla DubonnetMD [place]

## 2022-07-25 NOTE — Assessment & Plan Note (Signed)
I do think that her IBS is the primary driver of her symptoms at present. Will initiate SSRI and schedule for 4 week follow-up with PCP. Explained my reasoning for wanting to avoid Xanax, she will re-visit with her PCP at follow-up.  Discussed that colonoscopy might be necessary to ultimately get to the bottom of her symptoms--she continues to decline.  - Initiate Lexapro 4m daily, can increase to 227mdaily at follow-up - 4 week return visit with PCP - Continue to urge colonoscopy

## 2022-08-04 ENCOUNTER — Ambulatory Visit: Payer: Medicaid Other | Admitting: Student

## 2022-08-10 ENCOUNTER — Ambulatory Visit (INDEPENDENT_AMBULATORY_CARE_PROVIDER_SITE_OTHER): Payer: Medicaid Other | Admitting: Family Medicine

## 2022-08-10 ENCOUNTER — Encounter: Payer: Self-pay | Admitting: Family Medicine

## 2022-08-10 VITALS — BP 131/77 | HR 74 | Ht 62.0 in | Wt 183.4 lb

## 2022-08-10 DIAGNOSIS — M549 Dorsalgia, unspecified: Secondary | ICD-10-CM

## 2022-08-10 DIAGNOSIS — K581 Irritable bowel syndrome with constipation: Secondary | ICD-10-CM | POA: Diagnosis present

## 2022-08-10 LAB — POCT URINALYSIS DIP (MANUAL ENTRY)
Bilirubin, UA: NEGATIVE
Blood, UA: NEGATIVE
Glucose, UA: NEGATIVE mg/dL
Leukocytes, UA: NEGATIVE
Nitrite, UA: NEGATIVE
Spec Grav, UA: >=1.03 — AB (ref 1.010–1.025)
Urobilinogen, UA: 0.2 E.U./dL
pH, UA: 5 (ref 5.0–8.0)

## 2022-08-10 NOTE — Progress Notes (Signed)
    SUBJECTIVE:   CHIEF COMPLAINT / HPI:   Abdominal Pain - Previously seen on 2/16 - Has issues with bowel movements when getting stressed - Has had some mild improvement in symptoms - Was doing Dulcolax and metamucil - Started on Lexapro on 2/16 - For the past week she has not been taking anything for the bowel - Still has been having some stomach issues  - Has been on ozempic and is worried about the fact that there are more lawsuits - Pain seems to migrate over her abdomen on different days - Has Nexium and tums as needed for her reflux   PERTINENT  PMH / PSH: cholecystectomy, GERD,   OBJECTIVE:   BP 131/77   Pulse 74   Ht '5\' 2"'$  (1.575 m)   Wt 183 lb 6.4 oz (83.2 kg)   LMP 07/15/2015 (Exact Date)   SpO2 100%   BMI 33.54 kg/m   Gen: well-appearing, NAD CV: RRR, no m/r/g appreciated, no peripheral edema Pulm: CTAB, no wheezes/crackles GI: soft, mild LLQ tenderness, non-distended  ASSESSMENT/PLAN:   IBS  Bowel issues/concerns Patient continues to have abdominal pain with issues, is no longer taking anything to advance her bowel movements as she is having diarrhea.  Reassuring physical examination with no signs of acute abdomen.  UA was obtained due to patient complaints of some discomfort, which was negative for UTI.  Patient would like to see a GI specialist. - GI referral placed  Depression Patient currently on Lexapro 10 mg which was started on 2/16.  Patient reports some improvement in her mood symptoms.  Has follow-up with PCP, consider increasing to 20 mg at 4 to 6 weeks of treatment.  Rise Patience, [place]

## 2022-08-10 NOTE — Patient Instructions (Signed)
I put in a referral to the GI doctor for your abdominal pain.  Check back within 2 weeks if you have not heard anything about scheduling appointment.  I would recommend staying on the same dose of the Lexapro right now, I will discuss with Dr. Owens Shark at your next visit if you would like to go up on the dose

## 2022-08-19 NOTE — Progress Notes (Signed)
    SUBJECTIVE:   CHIEF COMPLAINT: diabetes check  HPI:   Carmen [name] is a 62 y.o.  with history notable for type 2 DM, obesity, and dyslipidemia presenting for follow up.  She was recently started on Lexapro.  She reports overall she is doing okay. Recently put her dog down. Stress is lower. Abdominal symptoms nearly resolved. She is now having regular Mica Releford BM daily.   Her medicaid recently got denied. Uses [facility] financial assistance and [employer].  Type 2 DM:  Metformin therapy: yes Additional agents: Ozempic  Adverse effects: None  Dietary review: Eats out with family, enjoys food  Physical activity: playing put put  Statin: has statin myopathy despite multiple trials ACE/ARB: yes Foot exam: done today Eye Exam: November 2024    Hypertension BP goal: 130/80 Current medications: Coreg, HCTZ, Losartan, Norvasc  Side effects: None Adherence: excellent     PERTINENT  PMH / PSH/Family/Social History : updated and reviewed  OBJECTIVE:   BP 134/68   Pulse 85   Ht 5\' 2"  (1.575 m)   Wt 183 lb 9.6 oz (83.3 kg)   LMP 07/15/2015 (Exact Date)   SpO2 98%   BMI 33.58 kg/m   Today's weight:  Last Weight  Most recent update: 08/20/2022 10:21 AM    Weight  83.3 kg (183 lb 9.6 oz)            Review of prior weights: Filed Weights   08/20/22 1021  Weight: 183 lb 9.6 oz (83.3 kg)     Cardiac: Regular rate and rhythm. Normal S1/S2. No murmurs, rubs, or gallops appreciated. Lungs: Clear bilaterally to ascultation.  Abdomen: Normoactive bowel sounds. No tenderness to deep or light palpation. No rebound or guarding.    Psych: Pleasant and appropriate  Foot exam WNL in quality metric tab   ASSESSMENT/PLAN:   Type 2 DM - Doing well on medications - Foot exam today - A1C at goal - Not on statin--has tried multiple, lipids today, will consider bempedoic acid  HTN - Near goal - BMP today given recent K elevation - Continue current medications     IBS ? Has  had bowel symptoms for years Worsens with stress Monitor for now, discussed CSY, declined  MDD Some concern for component bipolar--will monitor for now Discussed increased dose She will try 20 mg Monitor closely for escalation of symptoms and mood change   HCM Discussed and ordered mammogram--will message Jazmin about options Declined CSY    Dorris Singh, MD  [place]

## 2022-08-20 ENCOUNTER — Other Ambulatory Visit: Payer: Self-pay

## 2022-08-20 ENCOUNTER — Ambulatory Visit (INDEPENDENT_AMBULATORY_CARE_PROVIDER_SITE_OTHER): Payer: Medicaid Other | Admitting: Family Medicine

## 2022-08-20 ENCOUNTER — Encounter: Payer: Self-pay | Admitting: Pharmacist

## 2022-08-20 ENCOUNTER — Encounter: Payer: Self-pay | Admitting: Family Medicine

## 2022-08-20 ENCOUNTER — Ambulatory Visit (INDEPENDENT_AMBULATORY_CARE_PROVIDER_SITE_OTHER): Payer: Medicaid Other | Admitting: Pharmacist

## 2022-08-20 VITALS — BP 134/68 | HR 85 | Ht 62.0 in | Wt 183.6 lb

## 2022-08-20 DIAGNOSIS — T466X5A Adverse effect of antihyperlipidemic and antiarteriosclerotic drugs, initial encounter: Secondary | ICD-10-CM

## 2022-08-20 DIAGNOSIS — M792 Neuralgia and neuritis, unspecified: Secondary | ICD-10-CM

## 2022-08-20 DIAGNOSIS — E1149 Type 2 diabetes mellitus with other diabetic neurological complication: Secondary | ICD-10-CM | POA: Diagnosis present

## 2022-08-20 DIAGNOSIS — K581 Irritable bowel syndrome with constipation: Secondary | ICD-10-CM | POA: Diagnosis not present

## 2022-08-20 DIAGNOSIS — Z1231 Encounter for screening mammogram for malignant neoplasm of breast: Secondary | ICD-10-CM

## 2022-08-20 DIAGNOSIS — M791 Myalgia, unspecified site: Secondary | ICD-10-CM | POA: Diagnosis not present

## 2022-08-20 DIAGNOSIS — F331 Major depressive disorder, recurrent, moderate: Secondary | ICD-10-CM | POA: Diagnosis not present

## 2022-08-20 LAB — POCT GLYCOSYLATED HEMOGLOBIN (HGB A1C): HbA1c, POC (controlled diabetic range): 6 % (ref 0.0–7.0)

## 2022-08-20 MED ORDER — ESCITALOPRAM OXALATE 10 MG PO TABS: 20.0000 mg | ORAL_TABLET | Freq: Every day | ORAL | 2 refills | Status: DC

## 2022-08-20 NOTE — Patient Instructions (Signed)
Nice to see you today.   No changes to your medications today.   Keep up the great work with your diet and blood sugar.

## 2022-08-20 NOTE — Progress Notes (Signed)
S:     Chief Complaint  Patient presents with   Medication Management    Diabetes - Hypertension   62 y.o. female who presents for diabetes evaluation, education, and management.  PMH is significant for hypertension and hyperlipidemia.  Patient was referred and last seen by Primary Care Provider, Dr. Owens Shark who also saw patient today.   At last visit with Dr. Oleh Genin on 08/10/2022, patient was experiencing GI symptoms she relates to stress.   Today, patient arrives in good spirits and presents without any assistance.    Family/Social History: recent loss of her dog, some stress with her boyfriend.  Current diabetes medications include: Ozempic (semaglutide) 1mg  weekly Current hypertension medications include: Amlodipine 10mg , HCTZ 12.5mg  and Losartan 100mg  Hyperlipidemia medications: none currently - has taken atorvastatin in the past  Patient reports adherence to taking all medications as prescribed.    Do you feel that your medications are working for you? Yes  She is pleased with her blood pressure, blood sugar and her weight.  Have you been experiencing any side effects to the medications prescribed? no Do you have any problems obtaining medications due to transportation or finances? No / minimal Insurance coverage: remains on [employer] (not Medicaid).   Patient denies hypoglycemic events.  Patient-reported exercise habits have increased with improved weather over the last few weeks.    O:   Review of Systems  All other systems reviewed and are negative.   Physical Exam Constitutional:      Appearance: Normal appearance.  Pulmonary:     Effort: Pulmonary effort is normal.  Neurological:     Mental Status: She is alert.  Psychiatric:        Mood and Affect: Mood normal.        Thought Content: Thought content normal.    Lab Results  Component Value Date   HGBA1C 6.0 08/20/2022   BP 134/68  Lipid Panel     Component Value Date/Time   CHOL 202  (H) 02/16/2021 1218   TRIG 220 (H) 02/16/2021 1218   HDL 60 02/16/2021 1218   CHOLHDL 3.4 02/16/2021 1218   CHOLHDL 3.7 11/12/2015 1554   VLDL 71 (H) 11/12/2015 1554   LDLCALC 105 (H) 02/16/2021 1218    Clinical Atherosclerotic Cardiovascular Disease (ASCVD): No  The 10-year ASCVD risk score (Arnett DK, et al., 2019) is: 8.7%   Values used to calculate the score:     Age: 33 years     Sex: Female     Is Non-Hispanic African American: No     Diabetic: Yes     Tobacco smoker: No     Systolic Blood Pressure: [id number] mmHg     Is BP treated: Yes     HDL Cholesterol: 60 mg/dL     Total Cholesterol: 202 mg/dL     A/P: Diabetes longstanding and currently well controlled with A1c of 6.0 Patient is able to verbalize appropriate hypoglycemia management plan. Medication adherence appears good.  -Continued GLP-1 Ozempic (semaglutide) 1mg  weekly.   -Continued metforminXR 1000mg  BID.  -Discussed recommended lifestyle interventions, dietary effects on blood sugar control.  No change in therapy today.   ASCVD risk - primary prevention in patient with diabetes. Last lipid panel 02/2021. Previous has taken statin.  Reassess lipid panel today - ordered by PCP.   Hypertension currently well controlled. Blood pressure goal of <130/80 mmHg. Medication adherence appears good.  -Continued amlodipine 10mg , HCTZ 12.5mg  and Losartan 100mg   Written patient instructions provided.  Patient verbalized understanding of treatment plan.  Total time in face to face counseling 14 minutes.    Follow-up:  Pharmacist PRN. Patient seen with Louanne Belton PharmD PGY-1 Pharmacy Resident and Estelle June, PharmD Candidate.

## 2022-08-20 NOTE — Assessment & Plan Note (Signed)
Diabetes longstanding and currently well controlled with A1c of 6.0 Patient is able to verbalize appropriate hypoglycemia management plan. Medication adherence appears good.  -Continued GLP-1 Ozempic (semaglutide) 1mg  weekly.   -Continued metforminXR 1000mg  BID.  -Discussed recommended lifestyle interventions, dietary effects on blood sugar control.  No change in therapy today.

## 2022-08-20 NOTE — Progress Notes (Signed)
Reviewed and agree with Dr Koval's plan.   

## 2022-08-20 NOTE — Patient Instructions (Addendum)
It was wonderful to see you today.  Please bring ALL of your medications with you to every visit.   Today we talked about:  --Your A1C was 6.0!!! Congratulations    -- I sent in an increased dose of Lexapro 20 mg---you can take 2 tablets of the 10 mg--let me know how you feel   Please follow up in 3 months   Thank you for choosing [facility].   Please call [phone] with any questions about today's appointment.  Please be sure to schedule follow up at the front  desk before you leave today.   Dorris Singh, MD  Family Medicine

## 2022-08-21 LAB — BASIC METABOLIC PANEL
BUN/Creatinine Ratio: 23 (ref 12–28)
BUN: 26 mg/dL (ref 8–27)
CO2: 22 mmol/L (ref 20–29)
Calcium: 8.9 mg/dL (ref 8.7–10.3)
Chloride: 98 mmol/L (ref 96–106)
Creatinine, Ser: 1.14 mg/dL — ABNORMAL HIGH (ref 0.57–1.00)
Glucose: 188 mg/dL — ABNORMAL HIGH (ref 70–99)
Potassium: 3.8 mmol/L (ref 3.5–5.2)
Sodium: 139 mmol/L (ref 134–144)
eGFR: 55 mL/min/{1.73_m2} — ABNORMAL LOW (ref >59)

## 2022-08-21 LAB — LIPID PANEL
Chol/HDL Ratio: 3.6 ratio (ref 0.0–4.4)
Cholesterol, Total: 204 mg/dL — ABNORMAL HIGH (ref 100–199)
HDL: 56 mg/dL (ref >39)
LDL Chol Calc (NIH): 119 mg/dL — ABNORMAL HIGH (ref 0–99)
Triglycerides: 168 mg/dL — ABNORMAL HIGH (ref 0–149)
VLDL Cholesterol Cal: 29 mg/dL (ref 5–40)

## 2022-08-23 ENCOUNTER — Telehealth: Payer: Self-pay | Admitting: Family Medicine

## 2022-08-23 DIAGNOSIS — E1149 Type 2 diabetes mellitus with other diabetic neurological complication: Secondary | ICD-10-CM

## 2022-08-23 DIAGNOSIS — Z789 Other specified health status: Secondary | ICD-10-CM

## 2022-08-23 NOTE — Telephone Encounter (Signed)
Attempted to call patient. Reached voicemail, left generic voicemail to call back.  If she calls back, please ask her a good time to call.  Dorris Singh, MD  Family Medicine Teaching Service

## 2022-08-24 NOTE — Telephone Encounter (Signed)
Patient LVM on nurse line returning call to Dr. Owens Shark. Requesting returned call to [phone].  Talbot Grumbling, RN

## 2022-08-24 NOTE — Telephone Encounter (Signed)
Attempted to call patient. If she calls back please let her know I will call her tomorrow between 11-12   Dorris Singh, MD  [facility] Medicine Teaching Service

## 2022-08-25 ENCOUNTER — Other Ambulatory Visit (HOSPITAL_COMMUNITY): Payer: Self-pay

## 2022-08-25 ENCOUNTER — Telehealth: Payer: Self-pay

## 2022-08-25 MED ORDER — BEMPEDOIC ACID 180 MG PO TABS: 180.0000 mg | ORAL_TABLET | Freq: Every day | ORAL | 3 refills | Status: DC

## 2022-08-25 NOTE — Addendum Note (Signed)
Addended by: Owens Shark, Jassiah Viviano on: 08/25/2022 10:53 AM   Modules accepted: Orders

## 2022-08-25 NOTE — Telephone Encounter (Signed)
Her benefits verification showed managed medicaid ([employer]) and the PA was sent from the pharmacy once I went to check. I sent the PA though in the meantime. I'll f/u once they have a decision.

## 2022-08-25 NOTE — Telephone Encounter (Signed)
Called patient. Has a history of statin myopathy. Has muscle aches with multiple statins, multiple doses (Crestor, Pravachol). Even with re-challenge of Pravachol unable to tolerate.  Discussed need for lipid lowering agent. Rx bempedoic acid. Routing to Pharmacy team to see if we have assistance program for this.  Reviewed side effects, monitoring, how it works.  All questions answered.  Needs lipids at follow up Dorris Singh, MD  [facility] Medicine Teaching Service

## 2022-08-25 NOTE — Telephone Encounter (Signed)
A Prior Authorization was initiated for this patients NEXLETOL through CoverMyMeds.   Key: [id number]

## 2022-08-26 MED ORDER — EZETIMIBE 10 MG PO TABS: 10.0000 mg | ORAL_TABLET | Freq: Every day | ORAL | 3 refills | Status: DC

## 2022-08-26 NOTE — Telephone Encounter (Signed)
I will send in zetia and see if she tolerates this. She cannot tolerate any dose of statin.  Thank you Rosendo Gros! Dorris Singh, MD  Family Medicine Teaching Service

## 2022-08-26 NOTE — Addendum Note (Signed)
Addended by: Owens Shark, Michah Minton on: 08/26/2022 11:36 AM   Modules accepted: Orders

## 2022-08-26 NOTE — Telephone Encounter (Signed)
Prior Auth for patients medication NEXLETOL denied by [employer] via CoverMyMeds.   Reason:   Marena Chancy why they still denied. I attached chart notes and also mentioned that patient has statin myopathy. There is an appeal form attached to the denial letter that can be completed. Otherwise the preferred medication (that isnt a statin) is: Zetia   CoverMyMeds Key: [id number]

## 2022-09-16 ENCOUNTER — Encounter: Payer: Self-pay | Admitting: Internal Medicine

## 2022-09-16 ENCOUNTER — Ambulatory Visit
Admission: EM | Admit: 2022-09-16 | Discharge: 2022-09-16 | Disposition: A | Discharge status: Home / Self Care | Payer: Medicaid Other | Attending: Internal Medicine | Admitting: Internal Medicine

## 2022-09-16 DIAGNOSIS — M25562 Pain in left knee: Secondary | ICD-10-CM | POA: Diagnosis present

## 2022-09-16 DIAGNOSIS — Z20822 Contact with and (suspected) exposure to covid-19: Secondary | ICD-10-CM | POA: Diagnosis present

## 2022-09-16 DIAGNOSIS — Z9181 History of falling: Secondary | ICD-10-CM | POA: Insufficient documentation

## 2022-09-16 NOTE — ED Provider Notes (Signed)
[facility]  Note:  This document was prepared using Dragon voice recognition software and may include unintentional dictation errors.  MRN: [id number] DOB: 1961/04/08 DATE: 09/16/22   Subjective:  Chief Complaint:  Chief Complaint  Patient presents with   Facial Pain     HPI: Carmen [name] is a 62 y.o. female presenting for COVID exposure and left knee pain.  Patient states her grandson and daughter went to [place] last week and returned with COVID.  She states her family tested positive at the end of last week and this weekend.  She lives with her family is constantly around them.  She states the only symptom she currently has is some nasal congestion and myalgias.  She reports slight dry cough.  She does have a history of allergies per chart.  She is concerned because she is suppose to start a new job on Saturday and wants to postpone starting as long as possible.  She states her symptoms started approximately 1 to 2 days ago.  Denies fever, nausea/vomiting, abdominal pain, sore throat, otalgia. Endorses myalgias, nasal congestion, rhinorrhea. Presents NAD.  Patient also reports left knee pain for the past 2 weeks.  Patient states she was at [employer] shopping when she tripped over her shoe and landed on her left knee.  She states symptoms have since improved.  She is taking over-the-counter pain medicine as needed for pain, but it is no longer needed.  Reports occasional pain in the left knee.  She did have bruising, but has since resolved.  Prior to Admission medications   Medication Sig Start Date End Date Taking? Authorizing Provider  amLODipine (NORVASC) 10 MG tablet Take 1 tablet (10 mg total) by mouth at bedtime. 01/01/22   Billey Co, MD  Bempedoic Acid 180 MG TABS Take 1 tablet (180 mg total) by mouth daily. 08/25/22   Westley Chandler, MD  carvedilol (COREG) 25 MG tablet Take 1 tablet (25 mg total) by mouth 2 (two) times daily with a meal. 01/01/22   Pray,  Milus Mallick, MD  escitalopram (LEXAPRO) 10 MG tablet Take 2 tablets (20 mg total) by mouth daily. 08/20/22   Westley Chandler, MD  ezetimibe (ZETIA) 10 MG tablet Take 1 tablet (10 mg total) by mouth daily. 08/26/22   Westley Chandler, MD  gabapentin (NEURONTIN) 300 MG capsule Take 2 capsules (600 mg total) by mouth at bedtime. 04/05/22   Westley Chandler, MD  hydrochlorothiazide (HYDRODIURIL) 12.5 MG tablet Take 1 tablet (12.5 mg total) by mouth daily. 06/02/22   Westley Chandler, MD  Insulin Pen Needle (BD PEN NEEDLE MICRO U/F) 32G X 6 MM MISC Use to inject ozempic 09/14/21   Westley Chandler, MD  losartan (COZAAR) 100 MG tablet Take 1 tablet (100 mg total) by mouth at bedtime. 10/23/21   Kathrin Ruddy, RPH-CPP  metFORMIN (GLUCOPHAGE-XR) 500 MG 24 hr tablet TAKE 2 TABLETS BY MOUTH TWICE DAILY 06/02/22   Westley Chandler, MD  Semaglutide, 1 MG/DOSE, (OZEMPIC, 1 MG/DOSE,) 4 MG/3ML SOPN Inject 1 mg into the skin once a week. 09/14/21   Westley Chandler, MD  senna (SENOKOT) 8.6 MG TABS tablet Take 1 tablet (8.6 mg total) by mouth daily. Patient not taking: Reported on 08/10/2022 07/23/22   Alicia Amel, MD  triamcinolone (NASACORT) 55 MCG/ACT AERO nasal inhaler Place 2 sprays into the nose daily. 03/03/19   [provider]     Allergies  Allergen Reactions  Ace Inhibitors Cough    Other reaction(s): Cough (ALLERGY/intolerance)   Metformin And Related Diarrhea    Able to tolerate XR with only mild symptoms     History:   Past Medical History:  Diagnosis Date   Adhesive capsulitis of shoulder    Allergy    Borderline personality disorder    Carpal tunnel syndrome    COPD (chronic obstructive pulmonary disease)    Depression    GERD (gastroesophageal reflux disease)    Hypertension    Nephrolithiasis    Type 2 diabetes mellitus      Past Surgical History:  Procedure Laterality Date   CHOLECYSTECTOMY     TONSILLECTOMY      Family History  Problem Relation Age of Onset   Diabetes  Mother    Hypertension Mother    Alzheimer's disease Father    Dementia Father    Ovarian cancer Other        aunt uncertain age    Social History   Tobacco Use   Smoking status: Former    Packs/day: 0.25    Years: 30.00    Additional pack years: 0.00    Total pack years: 7.50    Types: Cigarettes    Quit date: 08/13/2007    Years since quitting: 15.1   Smokeless tobacco: Never   Tobacco comments:    boyfriend smokes  Vaping Use   Vaping Use: Never used  Substance Use Topics   Alcohol use: No    Comment: rarely   Drug use: No    Review of Systems  Constitutional:  Negative for fever.  HENT:  Positive for congestion, rhinorrhea and sinus pressure. Negative for ear pain and sore throat.   Respiratory:  Positive for cough.   Gastrointestinal:  Negative for abdominal pain, nausea and vomiting.  Musculoskeletal:  Positive for arthralgias and myalgias.  Neurological:  Positive for headaches.     Objective:   Vitals: BP (!) 141/83 (BP Location: Right Arm)   Pulse 87   Temp 98.7 F (37.1 C) (Oral)   Resp 20   LMP 07/15/2015 (Exact Date)   SpO2 98%   Physical Exam Constitutional:      General: She is not in acute distress.    Appearance: Normal appearance. She is well-developed. She is obese. She is not ill-appearing or toxic-appearing.  HENT:     Head: Normocephalic and atraumatic.  Cardiovascular:     Rate and Rhythm: Normal rate and regular rhythm.     Heart sounds: Normal heart sounds.  Pulmonary:     Effort: Pulmonary effort is normal.     Breath sounds: Normal breath sounds.     Comments: Clear to auscultation bilaterally  Abdominal:     General: Bowel sounds are normal.     Palpations: Abdomen is soft.     Tenderness: There is no abdominal tenderness.  Musculoskeletal:     Left lower leg: Normal. No tenderness.     Comments: Patient appears to have slight well-healing ecchymosis of left knee anteriorly.  Nontender to palpation.  Full range of motion.  Neurovascular intact.  No warmth, erythema, discharge.  Skin:    General: Skin is warm and dry.     Findings: Ecchymosis present.  Neurological:     General: No focal deficit present.     Mental Status: She is alert.  Psychiatric:        Mood and Affect: Mood and affect normal.     Results:  Labs: No results  found for this or any previous visit (from the past 24 hour(s)).  Radiology: No results found.   UC Course/Treatments:  Procedures: Procedures   Medications Ordered in UC: Medications - No data to display   Assessment and Plan :     ICD-10-CM   1. Exposure to COVID-19 virus  Z20.822 SARS CORONAVIRUS 2 (TAT 6-24 HRS) Anterior Nasal Swab    SARS CORONAVIRUS 2 (TAT 6-24 HRS) Anterior Nasal Swab    2. Acute pain of left knee  M25.562     3. History of fall  Z91.81      Exposure to COVID-19 virus: Afebrile, nontoxic-appearing, NAD. VSS. DDX includes but not limited to: Allergies, COVID, flu, viral sinusitis COVID is pending.  It is very likely that she could have COVID given close contacts.  She is having very mild symptoms at this time.  Recommend she continue with nasal spray and oral antihistamine for nasal congestion.  She is almost out of the range for antiviral treatment.  Last creatinine was slightly elevated, so recommend avoiding Paxlovid.  Encouraged symptomatic treatment with Tylenol and over the counter medications.  Strict ED precautions were given and patient verbalized understanding.  Acute pain of left knee: Afebrile, nontoxic-appearing, NAD. VSS. DDX includes but not limited to: Strain, contusion, fracture, ligament tear Improvement, almost resolved. Secondary to fall Given that pain is almost completely resolved, do not recommend imaging at this time.  The pain is so minimal that she is no longer needing to take over-the-counter analgesics as needed.  She can continue with topical analgesics as well as ice and rest.  If symptoms start to worsen,  recommend she follow-up with her PCP as directed.  Strict ED precautions were given and patient verbalized understanding.  History of fall: 2 weeks ago Denies LOC or hitting her head. Acute pain of left knee secondary to fall.  See notes above for treatment plan.   ED Discharge Orders     None        PDMP not reviewed this encounter.     Cynda Acres, PA-C 09/16/22 1156

## 2022-09-16 NOTE — Discharge Instructions (Addendum)
Your COVID test has been sent to the lab. Someone from our office will call you with your results. Continue with nasal spray and antihistamine for nasal congestion. You can take Tylenol for aches and pains if you are not allergic.   Return in 2 to 3 days if no improvement. It is very important for you to pay attention to any new symptoms or worsening of your current condition. Please go directly to the Emergency Department immediately should you begin to have any of the following symptoms: shortness of breath, chest pain or difficulty breathing.

## 2022-09-16 NOTE — ED Triage Notes (Addendum)
Pt c/o nasal congestion, infrequent cough, prickly feeling in head, mild HA unknown start date. Exposed to COVID recently at home. Home remedy: nose spray, hx of seasonal allergies. Pt had recent fall two weeks ago and c/o knee pain and bruising/swelling.

## 2022-09-17 LAB — SARS CORONAVIRUS 2 (TAT 6-24 HRS): SARS Coronavirus 2: POSITIVE — AB

## 2022-09-18 ENCOUNTER — Encounter: Payer: Self-pay | Admitting: Family Medicine

## 2022-09-21 ENCOUNTER — Encounter: Payer: Self-pay | Admitting: Family Medicine

## 2022-09-21 DIAGNOSIS — E119 Type 2 diabetes mellitus without complications: Secondary | ICD-10-CM

## 2022-09-21 DIAGNOSIS — K581 Irritable bowel syndrome with constipation: Secondary | ICD-10-CM

## 2022-09-21 MED ORDER — ESCITALOPRAM OXALATE 10 MG PO TABS: 20.0000 mg | ORAL_TABLET | Freq: Every day | ORAL | 2 refills | Status: DC

## 2022-09-21 MED ORDER — GABAPENTIN 300 MG PO CAPS: 600.0000 mg | ORAL_CAPSULE | Freq: Every day | ORAL | 3 refills | Status: DC

## 2022-09-23 ENCOUNTER — Ambulatory Visit
Admission: EM | Admit: 2022-09-23 | Discharge: 2022-09-23 | Disposition: A | Discharge status: Home / Self Care | Payer: Medicaid Other | Attending: Emergency Medicine | Admitting: Emergency Medicine

## 2022-09-23 DIAGNOSIS — N760 Acute vaginitis: Secondary | ICD-10-CM

## 2022-09-23 DIAGNOSIS — B3731 Acute candidiasis of vulva and vagina: Secondary | ICD-10-CM

## 2022-09-23 MED ORDER — TERCONAZOLE 0.4 % VA CREA: TOPICAL_CREAM | VAGINAL | 0 refills | Status: DC

## 2022-09-23 MED ORDER — FLUCONAZOLE 150 MG PO TABS: ORAL_TABLET | ORAL | 0 refills | Status: DC

## 2022-09-23 NOTE — ED Provider Notes (Addendum)
[facility]    CSN: [id number] Arrival date & time: 09/23/22  0825    HISTORY   Chief Complaint  Patient presents with   Vaginitis   covid testing   HPI Carmen [name] is a pleasant, 62 y.o. female who presents to urgent care today. Pt reports pain on her vaginal area that feels like a cut on her right labia near her anus concerned she may have something going on. Reports no vaginal discharge. Painful to wipe.  EMR reviewed.  Patient has a history of type 2 diabetes and frequent vaginal yeast infections.  Patient states she is sexually active but only every several months, patient reports no concern for herpes infection, denies blisters or lesions otherwise in vaginal area.  Denies burning with urination, increased frequency of urination.  The history is provided by the patient.   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    Home Medications    Prior to Admission medications   Medication Sig Start Date End Date Taking? Authorizing Provider   amLODipine (NORVASC) 10 MG tablet Take 1 tablet (10 mg total) by mouth at bedtime. 01/01/22   Billey Co, MD  Bempedoic Acid 180 MG TABS Take 1 tablet (180 mg total) by mouth daily. 08/25/22   Westley Chandler, MD  carvedilol (COREG) 25 MG tablet Take 1 tablet (25 mg total) by mouth 2 (two) times daily with a meal. 01/01/22   Pray, Milus Mallick, MD  escitalopram (LEXAPRO) 10 MG tablet Take 2 tablets (20 mg total) by mouth daily. 09/21/22   Westley Chandler, MD  ezetimibe (ZETIA) 10 MG tablet Take 1 tablet (10 mg total) by mouth daily. 08/26/22   Westley Chandler, MD  gabapentin (NEURONTIN) 300 MG capsule Take 2 capsules (600 mg total) by mouth at bedtime. 09/21/22   Westley Chandler, MD  hydrochlorothiazide (HYDRODIURIL) 12.5 MG tablet Take 1 tablet (12.5 mg total) by mouth daily. 06/02/22   Westley Chandler, MD  Insulin Pen Needle (BD PEN NEEDLE MICRO U/F) 32G X 6 MM MISC Use to inject ozempic 09/14/21   Westley Chandler, MD  losartan (COZAAR) 100 MG tablet Take 1 tablet (100 mg total) by mouth at bedtime. 10/23/21   Kathrin Ruddy, RPH-CPP  metFORMIN (GLUCOPHAGE-XR) 500 MG 24 hr tablet TAKE 2 TABLETS BY MOUTH TWICE DAILY 06/02/22   Westley Chandler, MD  Semaglutide, 1 MG/DOSE, (OZEMPIC, 1 MG/DOSE,) 4 MG/3ML SOPN Inject 1 mg into the skin once a week. 09/14/21  Westley Chandler, MD  senna (SENOKOT) 8.6 MG TABS tablet Take 1 tablet (8.6 mg total) by mouth daily. Patient not taking: Reported on 08/10/2022 07/23/22   Alicia Amel, MD  triamcinolone (NASACORT) 55 MCG/ACT AERO nasal inhaler Place 2 sprays into the nose daily. 03/03/19   [provider]    Family History Family History  Problem Relation Age of Onset   Diabetes Mother    Hypertension Mother    Alzheimer's disease Father    Dementia Father    Ovarian cancer Other        aunt uncertain age   Social History Social History   Tobacco Use   Smoking status: Former    Packs/day: 0.25    Years: 30.00    Additional pack years: 0.00     Total pack years: 7.50    Types: Cigarettes    Quit date: 08/13/2007    Years since quitting: 15.1   Smokeless tobacco: Never   Tobacco comments:    boyfriend smokes  Vaping Use   Vaping Use: Never used  Substance Use Topics   Alcohol use: No    Comment: rarely   Drug use: No   Allergies   Ace inhibitors and Metformin and related  Review of Systems Review of Systems Pertinent findings revealed after performing a 14 point review of systems has been noted in the history of present illness.  Physical Exam Vital Signs BP 121/79   Pulse 76   Temp 98.1 F (36.7 C)   Resp 19   LMP 07/15/2015 (Exact Date)   SpO2 98%   No data found.  Physical Exam Vitals and nursing note reviewed.  Constitutional:      General: She is not in acute distress.    Appearance: Normal appearance. She is not ill-appearing.  HENT:     Head: Normocephalic and atraumatic.  Eyes:     General: Lids are normal.        Right eye: No discharge.        Left eye: No discharge.     Extraocular Movements: Extraocular movements intact.     Conjunctiva/sclera: Conjunctivae normal.     Right eye: Right conjunctiva is not injected.     Left eye: Left conjunctiva is not injected.  Neck:     Trachea: Trachea and phonation normal.  Cardiovascular:     Rate and Rhythm: Normal rate and regular rhythm.     Pulses: Normal pulses.     Heart sounds: Normal heart sounds. No murmur heard.    No friction rub. No gallop.  Pulmonary:     Effort: Pulmonary effort is normal. No accessory muscle usage, prolonged expiration or respiratory distress.     Breath sounds: Normal breath sounds. No stridor, decreased air movement or transmitted upper airway sounds. No decreased breath sounds, wheezing, rhonchi or rales.  Chest:     Chest wall: No tenderness.  Genitourinary:    Labia:        Right: Rash present.        Left: Rash present.      Comments: Swab obtained for BV and Candida testing Musculoskeletal:         General: Normal range of motion.     Cervical back: Normal range of motion and neck supple. Normal range of motion.  Lymphadenopathy:     Cervical: No cervical adenopathy.  Skin:    General: Skin is warm and dry.     Findings: No erythema or rash.  Neurological:     General: No focal deficit present.     Mental Status: She is alert and oriented to person, place, and time.  Psychiatric:        Mood and Affect: Mood normal.        Behavior: Behavior normal.     Visual Acuity Right Eye Distance:   Left Eye Distance:   Bilateral Distance:    Right Eye Near:   Left Eye Near:    Bilateral Near:     UC Couse / Diagnostics / Procedures:     Radiology No results found.  Procedures Procedures (including critical care time) EKG  Pending results:  Labs Reviewed  CERVICOVAGINAL ANCILLARY ONLY    Medications Ordered in UC: Medications - No data to display  UC Diagnoses / Final Clinical Impressions(s)   I have reviewed the triage vital signs and the nursing notes.  Pertinent labs & imaging results that were available during my care of the patient were reviewed by me and considered in my medical decision making (see chart for details).    Final diagnoses:  Vaginal candidiasis  Vaginitis and vulvovaginitis   Patient was provided with Diflucan 150 mg every 3 days x 3 for empiric treatment of presumed vulvovaginal candidiasis based on the history provided to me today. Patient was provided with Terconazole cream applied 3-4 x daily as needed for treatment of vulvovaginal irritation based on the history provided to me today. STD screening was performed, patient advised that the results be posted to their MyChart and if any of the results are positive, they will be notified by phone, further treatment will be provided as indicated based on results of STD screening. Patient was advised to abstain from sexual intercourse until that they receive the results of their STD testing.   Patient was also advised to use condoms to protect themselves from STD exposure. Return precautions advised.  Drug allergies reviewed, all questions addressed.   Please see discharge instructions below for details of plan of care as provided to patient. ED Prescriptions     Medication Sig Dispense Auth. Provider   fluconazole (DIFLUCAN) 150 MG tablet Take first tablet on day 1.  Take second tablet 3 days after first tablet.  Take third tablet 3 days after second tablet. 3 tablet Theadora Rama Scales, PA-C   terconazole (TERAZOL 7) 0.4 % vaginal cream Apply twice daily to vulvovaginal area, can reapply after every void, use for 7 days as needed 45 g Theadora Rama Scales, PA-C      PDMP not reviewed this encounter.  Disposition Upon Discharge:  Condition: stable for discharge home  Patient presented with concern for an acute illness with associated systemic symptoms and significant discomfort requiring urgent management. In my opinion, this is a condition that a prudent lay person (someone who possesses an average knowledge of health and medicine) may potentially expect to result in complications if not addressed urgently such as respiratory distress, impairment of bodily function or dysfunction of bodily organs.   As such, the patient has been evaluated and assessed, work-up was performed and treatment was provided in alignment with urgent care protocols and evidence based medicine.  Patient/parent/caregiver has been advised that the patient may require follow up for further testing and/or treatment if the symptoms continue in spite of treatment, as clinically indicated and appropriate.  Routine symptom specific, illness specific and/or disease specific instructions were discussed with the patient and/or caregiver at length.  Prevention strategies for avoiding STD exposure were  also discussed.  The patient will follow up with their current PCP if and as advised. If the patient does not  currently have a PCP we will assist them in obtaining one.   The patient may need specialty follow up if the symptoms continue, in spite of conservative treatment and management, for further workup, evaluation, consultation and treatment as clinically indicated and appropriate.  Patient/parent/caregiver verbalized understanding and agreement of plan as discussed.  All questions were addressed during visit.  Please see discharge instructions below for further details of plan.  Discharge Instructions:   Discharge Instructions      The results of your vaginal swab test which screens for BV and yeast will be made posted to your MyChart account once it is complete.  This typically takes 2 to 4 days.  Please abstain from sexual intercourse of any kind, vaginal, oral or anal, until you have received the results of your STD testing.     If any of your results are abnormal, you will receive a phone call regarding treatment.  Additional prescriptions, if any are needed, will be provided for you at your pharmacy.     Based on the symptoms and concerns you shared with me today, you were treated for presumed vaginal yeast infection with fluconazole (Diflucan), take the first tablet today and take the second tablet three days after the first tablet.  I recommend that you abstain from sexual intercourse, tampon use or any other other intravaginal activities while being treated.     For comfort, while you are waiting for the result of your vaginal swab test, I have provided you with an external vaginal cream that you can apply 3-4 times daily for relief of vaginal itching and burning.  I recommend that you abstain from sexual intercourse, tampon use or any other other intravaginal activities while you are waiting on these results and possible treatment.   If you have not had complete resolution of your symptoms after completing any recommended treatment or if your symptoms worsen, please return for repeat  evaluation.   Thank you for visiting urgent care today.  I appreciate the opportunity to participate in your care.       This office note has been dictated using Teaching laboratory technician.  Unfortunately, this method of dictation can sometimes lead to typographical or grammatical errors.  I apologize for your inconvenience in advance if this occurs.  Please do not hesitate to reach out to me if clarification is needed.       Theadora Rama Scales, PA-C 09/23/22 0911    Theadora Rama Scales, PA-C 09/23/22 0920    Theadora Rama Scales, PA-C 09/23/22 [phone]

## 2022-09-23 NOTE — Discharge Instructions (Addendum)
The results of your vaginal swab test which screens for BV and yeast will be made posted to your MyChart account once it is complete.  This typically takes 2 to 4 days.  Please abstain from sexual intercourse of any kind, vaginal, oral or anal, until you have received the results of your STD testing.     If any of your results are abnormal, you will receive a phone call regarding treatment.  Additional prescriptions, if any are needed, will be provided for you at your pharmacy.     Based on the symptoms and concerns you shared with me today, you were treated for presumed vaginal yeast infection with fluconazole (Diflucan), take the first tablet today and take the second tablet three days after the first tablet.  I recommend that you abstain from sexual intercourse, tampon use or any other other intravaginal activities while being treated.     For comfort, while you are waiting for the result of your vaginal swab test, I have provided you with an external vaginal cream that you can apply 3-4 times daily for relief of vaginal itching and burning.  I recommend that you abstain from sexual intercourse, tampon use or any other other intravaginal activities while you are waiting on these results and possible treatment.   If you have not had complete resolution of your symptoms after completing any recommended treatment or if your symptoms worsen, please return for repeat evaluation.   Thank you for visiting urgent care today.  I appreciate the opportunity to participate in your care.

## 2022-09-23 NOTE — ED Triage Notes (Signed)
Pt presents to uc with co of recent covid and would like a retest to see if she is safe to visit family.   Pt reports pain on her vaginal area that feels like a cut on her right labia near her anus concerned she may have something going on. Reports no vaginal discharge. Painful to wipe.

## 2022-09-27 ENCOUNTER — Telehealth: Payer: Self-pay

## 2022-09-27 ENCOUNTER — Telehealth: Payer: Self-pay | Admitting: Emergency Medicine

## 2022-09-27 LAB — CERVICOVAGINAL ANCILLARY ONLY
Bacterial Vaginitis (gardnerella): POSITIVE — AB
Candida Glabrata: NEGATIVE
Candida Vaginitis: NEGATIVE
Chlamydia: NEGATIVE
Comment: NEGATIVE
Comment: NEGATIVE
Comment: NEGATIVE
Comment: NEGATIVE
Comment: NEGATIVE
Comment: NORMAL
Neisseria Gonorrhea: NEGATIVE
Trichomonas: NEGATIVE

## 2022-09-27 MED ORDER — TERCONAZOLE 0.4 % VA CREA: 1.0000 | TOPICAL_CREAM | Freq: Every day | VAGINAL | 0 refills | Status: DC

## 2022-09-27 MED ORDER — METRONIDAZOLE 500 MG PO TABS: 500.0000 mg | ORAL_TABLET | Freq: Two times a day (BID) | ORAL | 0 refills | Status: AC

## 2022-09-27 NOTE — Telephone Encounter (Signed)
TC from pt, ID verified. Pt requests Rx RF of Terconazole. Pt states she was inserting cream via insert and has run out of medication needed for 7 days. Advised per Theadora Rama Scales, APP Rx RF approved and sent to pharmacy.  Reviewed Rx instructions and Pt verbalizes understanding.

## 2022-09-27 NOTE — Telephone Encounter (Signed)
Pt tested positive for BV, rx for flagyl sent to pharmacy.  Pt notified via MyChart.

## 2022-09-29 ENCOUNTER — Ambulatory Visit
Admission: EM | Admit: 2022-09-29 | Discharge: 2022-09-29 | Disposition: A | Discharge status: Home / Self Care | Payer: Medicaid Other | Attending: Internal Medicine | Admitting: Internal Medicine

## 2022-09-29 DIAGNOSIS — R519 Headache, unspecified: Secondary | ICD-10-CM | POA: Diagnosis not present

## 2022-09-29 DIAGNOSIS — U071 COVID-19: Secondary | ICD-10-CM | POA: Diagnosis not present

## 2022-09-29 NOTE — Discharge Instructions (Signed)
Your COVID test has been sent to the lab. Someone from our office will call you with your results.   Return in 2 to 3 days if no improvement. It is very important for you to pay attention to any new symptoms or worsening of your current condition. Please go directly to the Emergency Department immediately should you begin to have any of the following symptoms: shortness of breath, chest pain or difficulty breathing.

## 2022-09-29 NOTE — ED Provider Notes (Signed)
[facility]  Note:  This document was prepared using Dragon voice recognition software and may include unintentional dictation errors.  MRN: [id number] DOB: 06-Dec-1960 DATE: 09/29/22   Subjective:  Chief Complaint:  Chief Complaint  Patient presents with   Headache     HPI: Carmen [name] is a 62 y.o. female presenting for intermittent headache for the past 1 to 2 days.  Patient was positive for COVID on 09/16/2022 per chart.  She states she was doing better and her symptoms were improving for about a week, but within the past couple days she is starting to develop another headache intermittently.  She states that she gets a "prickly sensation" on her forehead and in her nose.  She states she gets this sensation every time she has COVID.  She presents requesting another COVID test with concerned that she has contracted COVID again.  She is concerned because she was supposed to be starting a new job within the next few days and wants to know if she has COVID.  Denies fever, sore throat, otalgia, nausea/vomiting, cough. Endorses headache. Presents NAD.  Prior to Admission medications   Medication Sig Start Date End Date Taking? Authorizing Provider  amLODipine (NORVASC) 10 MG tablet Take 1 tablet (10 mg total) by mouth at bedtime. 01/01/22   Billey Co, MD  Bempedoic Acid 180 MG TABS Take 1 tablet (180 mg total) by mouth daily. 08/25/22   Westley Chandler, MD  carvedilol (COREG) 25 MG tablet Take 1 tablet (25 mg total) by mouth 2 (two) times daily with a meal. 01/01/22   Pray, Milus Mallick, MD  escitalopram (LEXAPRO) 10 MG tablet Take 2 tablets (20 mg total) by mouth daily. 09/21/22   Westley Chandler, MD  ezetimibe (ZETIA) 10 MG tablet Take 1 tablet (10 mg total) by mouth daily. 08/26/22   Westley Chandler, MD  fluconazole (DIFLUCAN) 150 MG tablet Take first tablet on day 1.  Take second tablet 3 days after first tablet.  Take third tablet 3 days after second tablet. 09/23/22   Theadora Rama Scales, PA-C  gabapentin (NEURONTIN) 300 MG capsule Take 2 capsules (600 mg total) by mouth at bedtime. 09/21/22   Westley Chandler, MD  hydrochlorothiazide (HYDRODIURIL) 12.5 MG tablet Take 1 tablet (12.5 mg total) by mouth daily. 06/02/22   Westley Chandler, MD  Insulin Pen Needle (BD PEN NEEDLE MICRO U/F) 32G X 6 MM MISC Use to inject ozempic 09/14/21   Westley Chandler, MD  losartan (COZAAR) 100 MG tablet Take 1 tablet (100 mg total) by mouth at bedtime. 10/23/21   Kathrin Ruddy, RPH-CPP  metFORMIN (GLUCOPHAGE-XR) 500 MG 24 hr tablet TAKE 2 TABLETS BY MOUTH TWICE DAILY 06/02/22   Westley Chandler, MD  metroNIDAZOLE (FLAGYL) 500 MG tablet Take 1 tablet (500 mg total) by mouth 2 (two) times daily for 7 days. 09/27/22 10/04/22  Theadora Rama Scales, PA-C  Semaglutide, 1 MG/DOSE, (OZEMPIC, 1 MG/DOSE,) 4 MG/3ML SOPN Inject 1 mg into the skin once a week. 09/14/21   Westley Chandler, MD  senna (SENOKOT) 8.6 MG TABS tablet Take 1 tablet (8.6 mg total) by mouth daily. Patient not taking: Reported on 08/10/2022 07/23/22   Alicia Amel, MD  terconazole (TERAZOL 7) 0.4 % vaginal cream Apply twice daily to vulvovaginal area, can reapply after every void, use for 7 days as needed 09/23/22   Theadora Rama Scales, PA-C  terconazole (TERAZOL 7) 0.4 % vaginal cream Place  1 applicator vaginally at bedtime. 09/27/22   Theadora Rama Scales, PA-C  triamcinolone (NASACORT) 55 MCG/ACT AERO nasal inhaler Place 2 sprays into the nose daily. 03/03/19   [provider]     Allergies  Allergen Reactions   Ace Inhibitors Cough    Other reaction(s): Cough (ALLERGY/intolerance)   Metformin And Related Diarrhea    Able to tolerate XR with only mild symptoms     History:   Past Medical History:  Diagnosis Date   Adhesive capsulitis of shoulder    Allergy    Borderline personality disorder    Carpal tunnel syndrome    COPD (chronic obstructive pulmonary disease)    Depression    GERD (gastroesophageal  reflux disease)    Hypertension    Nephrolithiasis    Type 2 diabetes mellitus      Past Surgical History:  Procedure Laterality Date   CHOLECYSTECTOMY     TONSILLECTOMY      Family History  Problem Relation Age of Onset   Diabetes Mother    Hypertension Mother    Alzheimer's disease Father    Dementia Father    Ovarian cancer Other        aunt uncertain age    Social History   Tobacco Use   Smoking status: Former    Packs/day: 0.25    Years: 30.00    Additional pack years: 0.00    Total pack years: 7.50    Types: Cigarettes    Quit date: 08/13/2007    Years since quitting: 15.1   Smokeless tobacco: Never   Tobacco comments:    boyfriend smokes  Vaping Use   Vaping Use: Never used  Substance Use Topics   Alcohol use: No    Comment: rarely   Drug use: No    Review of Systems  Constitutional:  Negative for fever.  HENT:  Negative for congestion, ear pain, rhinorrhea, sinus pressure and sore throat.   Respiratory:  Negative for cough.   Gastrointestinal:  Negative for nausea and vomiting.  Neurological:  Positive for headaches.     Objective:   Vitals: BP 132/87 (BP Location: Right Arm)   Pulse 72   Temp 98.5 F (36.9 C) (Oral)   Resp 18   LMP 07/15/2015 (Exact Date)   SpO2 98%   Physical Exam Constitutional:      General: She is not in acute distress.    Appearance: Normal appearance. She is well-developed. She is obese. She is not ill-appearing or toxic-appearing.  HENT:     Head: Normocephalic and atraumatic.     Right Ear: Tympanic membrane and ear canal normal.     Left Ear: Tympanic membrane and ear canal normal.     Mouth/Throat:     Pharynx: Oropharynx is clear. Uvula midline. No pharyngeal swelling, oropharyngeal exudate or posterior oropharyngeal erythema.     Tonsils: No tonsillar exudate or tonsillar abscesses.  Cardiovascular:     Rate and Rhythm: Normal rate and regular rhythm.     Heart sounds: Normal heart sounds.  Pulmonary:      Effort: Pulmonary effort is normal.     Breath sounds: Normal breath sounds.     Comments: Clear to auscultation bilaterally Abdominal:     General: Bowel sounds are normal.     Palpations: Abdomen is soft.     Tenderness: There is no abdominal tenderness.  Skin:    General: Skin is warm and dry.  Neurological:     General: No focal  deficit present.     Mental Status: She is alert.  Psychiatric:        Mood and Affect: Mood and affect normal.     Results:  Labs: No results found for this or any previous visit (from the past 24 hour(s)).  Radiology: No results found.   UC Course/Treatments:  Procedures: Procedures   Medications Ordered in UC: Medications - No data to display   Assessment and Plan :     ICD-10-CM   1. Acute nonintractable headache, unspecified headache type  R51.9 SARS CORONAVIRUS 2 (TAT 6-24 HRS) Anterior Nasal Swab    SARS CORONAVIRUS 2 (TAT 6-24 HRS) Anterior Nasal Swab      Acute non-intractable headache: Afebrile, nontoxic-appearing, NAD. VSS. DDX includes but not limited to: Sinusitis, tension headache, migraine Patient presents requesting COVID test.  She states that every time she gets this type of headache she has always had COVID in the past and concerned she has COVID again.  I explained to the patient that it is very unlikely that she would have COVID again since she was just positive 2 weeks ago.  Explained to her that her test result may still be positive today, but that does not change her treatment plan and does not keep her out of work.  Patient states she is set to start a new job soon and appears to not want to start that job.  I explained to the patient that even if she is positive given that her symptoms are improving and she has not had a fever, she will still have to return to work. Recommend over-the-counter analgesics as needed for pain.  Strict ED precautions were given and patient verbalized understanding.  ED Discharge Orders      None        PDMP not reviewed this encounter.       Sigourney Portillo P, PA-C 09/29/22 1418

## 2022-09-29 NOTE — ED Triage Notes (Signed)
Pt c/o of HA and sinus congestion past few days. Pt states she feels pricking feeling in her forehead and sinuses.

## 2022-09-30 LAB — SARS CORONAVIRUS 2 (TAT 6-24 HRS): SARS Coronavirus 2: POSITIVE — AB

## 2022-10-08 ENCOUNTER — Ambulatory Visit
Admission: EM | Admit: 2022-10-08 | Discharge: 2022-10-08 | Disposition: A | Discharge status: Home / Self Care | Payer: Medicaid Other | Attending: Internal Medicine | Admitting: Internal Medicine

## 2022-10-08 ENCOUNTER — Telehealth: Payer: Self-pay | Admitting: Internal Medicine

## 2022-10-08 DIAGNOSIS — E1140 Type 2 diabetes mellitus with diabetic neuropathy, unspecified: Secondary | ICD-10-CM | POA: Diagnosis not present

## 2022-10-08 DIAGNOSIS — N898 Other specified noninflammatory disorders of vagina: Secondary | ICD-10-CM | POA: Insufficient documentation

## 2022-10-08 LAB — POCT URINALYSIS DIP (MANUAL ENTRY)
Bilirubin, UA: NEGATIVE
Blood, UA: NEGATIVE
Glucose, UA: NEGATIVE mg/dL
Ketones, POC UA: NEGATIVE mg/dL
Leukocytes, UA: NEGATIVE
Nitrite, UA: NEGATIVE
Protein Ur, POC: 30 mg/dL — AB
Spec Grav, UA: 1.025 (ref 1.010–1.025)
Urobilinogen, UA: 0.2 E.U./dL
pH, UA: 6 (ref 5.0–8.0)

## 2022-10-08 MED ORDER — METRONIDAZOLE 0.75 % VA GEL: 1.0000 | Freq: Every day | VAGINAL | 0 refills | Status: AC

## 2022-10-08 MED ORDER — CLINDAMYCIN PHOSPHATE 2 % VA CREA: 1.0000 | TOPICAL_CREAM | Freq: Every day | VAGINAL | 0 refills | Status: DC

## 2022-10-08 NOTE — Telephone Encounter (Signed)
Patient called and [facility] pharmacy was contacted by nursing staff.  Per pharmacy, clindamycin cream was not covered by insurance.  The cream was discontinued and prescription was sent for metronidazole vaginal gel 0.75%.

## 2022-10-08 NOTE — ED Provider Notes (Signed)
[facility]  Note:  This document was prepared using Dragon voice recognition software and may include unintentional dictation errors.  MRN: [id number] DOB: 1960-10-09 DATE: 10/08/22   Subjective:  Chief Complaint:  Chief Complaint  Patient presents with   Vaginal Discharge     HPI: Carmen [name] is a 62 y.o. female presenting for vaginal discharge for 1 day.  Patient states she does have a yellow vaginal discharge with a foul odor.  She states she is still taking BV medication and has 1 day left; however, unsure how patient has any BV medication left considering she only got 7 days on 09/27/2022.  Per her chart, she should have been out of the medication for several days now, but she states she has been taking as directed.  Unsure if she got a prescription from somewhere else.  She reports no pruritus or pain vaginally.  She was having some vaginal discomfort on 09/27/2022, but reports it has improved.  She denies any dysuria or abdominal pain. Denies fever, nausea/vomiting, back pain. Endorses vaginal discharge and vaginal odor.  Patient states she does not check her glucose regularly, but she averages in the 150s and last A1c was 5.7.  Presents NAD.  Prior to Admission medications   Medication Sig Start Date End Date Taking? Authorizing Provider  amLODipine (NORVASC) 10 MG tablet Take 1 tablet (10 mg total) by mouth at bedtime. 01/01/22   Billey Co, MD  Bempedoic Acid 180 MG TABS Take 1 tablet (180 mg total) by mouth daily. 08/25/22   Westley Chandler, MD  carvedilol (COREG) 25 MG tablet Take 1 tablet (25 mg total) by mouth 2 (two) times daily with a meal. 01/01/22   Pray, Milus Mallick, MD  escitalopram (LEXAPRO) 10 MG tablet Take 2 tablets (20 mg total) by mouth daily. 09/21/22   Westley Chandler, MD  ezetimibe (ZETIA) 10 MG tablet Take 1 tablet (10 mg total) by mouth daily. 08/26/22   Westley Chandler, MD  fluconazole (DIFLUCAN) 150 MG tablet Take first tablet on day 1.  Take  second tablet 3 days after first tablet.  Take third tablet 3 days after second tablet. 09/23/22   Theadora Rama Scales, PA-C  gabapentin (NEURONTIN) 300 MG capsule Take 2 capsules (600 mg total) by mouth at bedtime. 09/21/22   Westley Chandler, MD  hydrochlorothiazide (HYDRODIURIL) 12.5 MG tablet Take 1 tablet (12.5 mg total) by mouth daily. 06/02/22   Westley Chandler, MD  Insulin Pen Needle (BD PEN NEEDLE MICRO U/F) 32G X 6 MM MISC Use to inject ozempic 09/14/21   Westley Chandler, MD  losartan (COZAAR) 100 MG tablet Take 1 tablet (100 mg total) by mouth at bedtime. 10/23/21   Kathrin Ruddy, RPH-CPP  metFORMIN (GLUCOPHAGE-XR) 500 MG 24 hr tablet TAKE 2 TABLETS BY MOUTH TWICE DAILY 06/02/22   Westley Chandler, MD  Semaglutide, 1 MG/DOSE, (OZEMPIC, 1 MG/DOSE,) 4 MG/3ML SOPN Inject 1 mg into the skin once a week. 09/14/21   Westley Chandler, MD  senna (SENOKOT) 8.6 MG TABS tablet Take 1 tablet (8.6 mg total) by mouth daily. Patient not taking: Reported on 08/10/2022 07/23/22   Alicia Amel, MD  terconazole (TERAZOL 7) 0.4 % vaginal cream Apply twice daily to vulvovaginal area, can reapply after every void, use for 7 days as needed 09/23/22   Theadora Rama Scales, PA-C  terconazole (TERAZOL 7) 0.4 % vaginal cream Place 1 applicator vaginally at bedtime. 09/27/22  Theadora Rama Scales, PA-C  triamcinolone (NASACORT) 55 MCG/ACT AERO nasal inhaler Place 2 sprays into the nose daily. 03/03/19   [provider]     Allergies  Allergen Reactions   Ace Inhibitors Cough    Other reaction(s): Cough (ALLERGY/intolerance)   Metformin And Related Diarrhea    Able to tolerate XR with only mild symptoms     History:   Past Medical History:  Diagnosis Date   Adhesive capsulitis of shoulder    Allergy    Borderline personality disorder (HCC)    Carpal tunnel syndrome    COPD (chronic obstructive pulmonary disease) (HCC)    Depression    GERD (gastroesophageal reflux disease)    Hypertension     Nephrolithiasis    Type 2 diabetes mellitus (HCC)      Past Surgical History:  Procedure Laterality Date   CHOLECYSTECTOMY     TONSILLECTOMY      Family History  Problem Relation Age of Onset   Diabetes Mother    Hypertension Mother    Alzheimer's disease Father    Dementia Father    Ovarian cancer Other        aunt uncertain age    Social History   Tobacco Use   Smoking status: Former    Packs/day: 0.25    Years: 30.00    Additional pack years: 0.00    Total pack years: 7.50    Types: Cigarettes    Quit date: 08/13/2007    Years since quitting: 15.1   Smokeless tobacco: Never   Tobacco comments:    boyfriend smokes  Vaping Use   Vaping Use: Never used  Substance Use Topics   Alcohol use: No    Comment: rarely   Drug use: No    Review of Systems  Constitutional:  Negative for fever.  HENT:  Positive for congestion and dental problem.   Gastrointestinal:  Negative for abdominal pain, nausea and vomiting.  Genitourinary:  Positive for vaginal discharge. Negative for dysuria, flank pain, frequency, pelvic pain and vaginal pain.  Musculoskeletal:  Negative for back pain.     Objective:   Vitals: BP 112/75 (BP Location: Right Arm)   Pulse 82   Temp 98.3 F (36.8 C) (Oral)   Resp 18   LMP 07/15/2015 (Exact Date)   SpO2 96%   Physical Exam Constitutional:      General: She is not in acute distress.    Appearance: Normal appearance. She is well-developed. She is obese. She is not ill-appearing or toxic-appearing.  HENT:     Head: Normocephalic and atraumatic.  Cardiovascular:     Rate and Rhythm: Normal rate and regular rhythm.     Heart sounds: Normal heart sounds.  Pulmonary:     Effort: Pulmonary effort is normal.     Breath sounds: Normal breath sounds.     Comments: Clear to auscultation bilaterally Abdominal:     General: Bowel sounds are normal.     Palpations: Abdomen is soft.     Tenderness: There is no abdominal tenderness. There is no  right CVA tenderness or left CVA tenderness.  Skin:    General: Skin is warm and dry.  Neurological:     General: No focal deficit present.     Mental Status: She is alert.  Psychiatric:        Mood and Affect: Mood and affect normal.     Results:  Labs: Results for orders placed or performed during the hospital encounter of  10/08/22 (from the past 24 hour(s))  POCT urinalysis dipstick     Status: Abnormal   Collection Time: 10/08/22 10:10 AM  Result Value Ref Range   Color, UA yellow yellow   Clarity, UA clear clear   Glucose, UA negative negative mg/dL   Bilirubin, UA negative negative   Ketones, POC UA negative negative mg/dL   Spec Grav, UA 7.829 5.621 - 1.025   Blood, UA negative negative   pH, UA 6.0 5.0 - 8.0   Protein Ur, POC =30 (A) negative mg/dL   Urobilinogen, UA 0.2 0.2 or 1.0 E.U./dL   Nitrite, UA Negative Negative   Leukocytes, UA Negative Negative    Radiology: No results found.   UC Course/Treatments:  Procedures: Procedures   Medications Ordered in UC: Medications - No data to display   Assessment and Plan :     ICD-10-CM   1. Vaginal discharge  N89.8     2. Type 2 diabetes mellitus with diabetic neuropathy, unspecified whether long term insulin use (HCC)  E11.40      Vaginal discharge: Afebrile, nontoxic-appearing, NAD. VSS. DDX includes but not limited to: Bacterial vaginosis, candidiasis, gonorrhea, chlamydia UA was unremarkable today in office.  Cytology is pending.  Patient states she is still currently taking medication for previously diagnosed BV; however, per the chart she should have been out several days ago of both the Flagyl and Terconazole.  She states she is taking the medication as directed.  Given recent Flagyl use and still having symptoms, will try clindamycin vaginal gel 2% every night for 7 days for recurrent bacterial vaginosis.  If cytology is negative, have her stop the clindamycin cream and have her follow-up with her PCP  and/or GYN. Strict ED precautions were given and patient verbalized understanding.  Type 2 diabetes mellitus with diabetic neuropathy: Active Previously diagnosed Recommend she continue gabapentin 600 mg at bedtime.  Recommend she follow-up with PCP for management.   ED Discharge Orders          Ordered    clindamycin (CLEOCIN) 2 % vaginal cream  Daily at bedtime        10/08/22 1006             I have reviewed the PDMP during this encounter.     Terrion Gencarelli P, PA-C 10/08/22 1027

## 2022-10-08 NOTE — Discharge Instructions (Signed)
Your swab was sent to the lab for further testing.  You will be called with results. Clindamycin cream is an antibiotic given to treat vaginal infections. Take the prescription as directed.  You should avoid all sexual activity until you have been notified of all your results and have undergone any necessary treatment.  If you are positive, it is recommended that you inform all sexual partners so they can treat be treated as well before having sex again.

## 2022-10-08 NOTE — ED Triage Notes (Signed)
Pt c/o vaginal discharge starting yesterday with foul odor. No itching or vaginal irritation. Has one dose of medication for current BV infection.

## 2022-10-11 LAB — CERVICOVAGINAL ANCILLARY ONLY
Bacterial Vaginitis (gardnerella): NEGATIVE
Candida Glabrata: NEGATIVE
Candida Vaginitis: NEGATIVE
Chlamydia: NEGATIVE
Comment: NEGATIVE
Comment: NEGATIVE
Comment: NEGATIVE
Comment: NEGATIVE
Comment: NEGATIVE
Comment: NORMAL
Neisseria Gonorrhea: NEGATIVE
Trichomonas: NEGATIVE

## 2022-10-17 ENCOUNTER — Ambulatory Visit
Admission: EM | Admit: 2022-10-17 | Discharge: 2022-10-17 | Disposition: A | Discharge status: Home / Self Care | Payer: Medicaid Other | Attending: Emergency Medicine | Admitting: Emergency Medicine

## 2022-10-17 ENCOUNTER — Telehealth: Payer: Self-pay

## 2022-10-17 DIAGNOSIS — J302 Other seasonal allergic rhinitis: Secondary | ICD-10-CM

## 2022-10-17 DIAGNOSIS — J441 Chronic obstructive pulmonary disease with (acute) exacerbation: Secondary | ICD-10-CM

## 2022-10-17 DIAGNOSIS — K219 Gastro-esophageal reflux disease without esophagitis: Secondary | ICD-10-CM | POA: Diagnosis not present

## 2022-10-17 MED ORDER — ESOMEPRAZOLE MAGNESIUM 40 MG PO CPDR: 40.0000 mg | DELAYED_RELEASE_CAPSULE | Freq: Every day | ORAL | 0 refills | Status: DC

## 2022-10-17 MED ORDER — ALBUTEROL SULFATE HFA 108 (90 BASE) MCG/ACT IN AERS: 2.0000 | INHALATION_SPRAY | Freq: Four times a day (QID) | RESPIRATORY_TRACT | 2 refills | Status: DC | PRN

## 2022-10-17 MED ORDER — FLUTICASONE PROPIONATE 50 MCG/ACT NA SUSP: 1.0000 | Freq: Every day | NASAL | 1 refills | Status: DC

## 2022-10-17 MED ORDER — CETIRIZINE HCL 10 MG PO TABS: 10.0000 mg | ORAL_TABLET | Freq: Every day | ORAL | 1 refills | Status: DC

## 2022-10-17 MED ORDER — AEROCHAMBER PLUS FLO-VU LARGE MISC: 1.0000 | Freq: Once | 0 refills | Status: AC

## 2022-10-17 MED ORDER — IPRATROPIUM-ALBUTEROL 0.5-2.5 (3) MG/3ML IN SOLN
3.0000 mL | Freq: Once | RESPIRATORY_TRACT | Status: AC
  Administered 2022-10-17: 3 mL via RESPIRATORY_TRACT

## 2022-10-17 MED ORDER — PROMETHAZINE-DM 6.25-15 MG/5ML PO SYRP: 5.0000 mL | ORAL_SOLUTION | Freq: Every evening | ORAL | 0 refills | Status: DC | PRN

## 2022-10-17 NOTE — Telephone Encounter (Signed)
Pt called reporting flu-like symptoms x1-2 days. Also discussed recurrent yeast infection symptoms. Pt advised that per provided no medications for her symptoms could be prescribed over the phone, and that we would be happy to see her at the clinic if she chose to be seen. Pt verbalized understanding.

## 2022-10-17 NOTE — ED Provider Notes (Signed)
[facility]    CSN: [id number] Arrival date & time: 10/17/22  7846    HISTORY   Chief Complaint  Patient presents with   Fever   Emesis   HPI Carmen [name] is a pleasant, 62 y.o. female who presents to urgent care today. Patient reports a 1 to 2-day history of nonproductive cough which is worse at night, fever and emesis, reports a Tmax of 101.5 last night along with cold chills.  Patient states that she missed work 2 days ago because of these symptoms, is requesting a note and testing for influenza and COVID.  Patient was unable to verify when she vomited or the contents of her vomit when asked multiple times.  Patient states her boyfriend has been sick for the past week.  Patient states she has been having hot flashes and heavy cough as well.  Denies shortness of breath, sputum production, body aches, headache, loss of taste and smell.  Patient states she took DayQuil and honey Robitussin last night with minimal relief.  EMR reviewed by me.  Patient has a history of COPD but I do not see that she is ever been prescribed any rescue or maintenance inhalers for this.  Patient has been prescribed nasal steroid sprays in the past.  Patient denies history of allergies but states her boyfriend has been doing some remodeling in his home recently.  Patient also complains of worsening heartburn over the past few months, states she used to take Nexium every day but has not been doing so.  Patient states has been taking Tums intermittently.  The history is provided by the patient.   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    Home Medications    Prior to Admission medications   Medication Sig Start Date End Date Taking? Authorizing Provider  amLODipine (NORVASC) 10 MG tablet Take 1 tablet (10 mg total) by mouth at bedtime. 01/01/22   Billey Co, MD  Bempedoic Acid 180 MG TABS Take 1 tablet (180 mg total) by mouth daily. 08/25/22   Westley Chandler, MD  carvedilol (COREG) 25 MG tablet Take 1 tablet (25 mg total) by mouth 2 (two) times daily with a meal. 01/01/22   Pray, Milus Mallick, MD  escitalopram (LEXAPRO) 10 MG tablet Take 2 tablets (20 mg total) by mouth daily. 09/21/22   Westley Chandler, MD  ezetimibe (ZETIA) 10 MG tablet Take 1 tablet (10 mg total) by mouth daily. 08/26/22  Westley Chandler, MD  fluconazole (DIFLUCAN) 150 MG tablet Take first tablet on day 1.  Take second tablet 3 days after first tablet.  Take third tablet 3 days after second tablet. 09/23/22   Theadora Rama Scales, PA-C  gabapentin (NEURONTIN) 300 MG capsule Take 2 capsules (600 mg total) by mouth at bedtime. 09/21/22   Westley Chandler, MD  hydrochlorothiazide (HYDRODIURIL) 12.5 MG tablet Take 1 tablet (12.5 mg total) by mouth daily. 06/02/22   Westley Chandler, MD  Insulin Pen Needle (BD PEN NEEDLE MICRO U/F) 32G X 6 MM MISC Use to inject ozempic 09/14/21   Westley Chandler, MD  losartan (COZAAR) 100 MG tablet Take 1 tablet (100 mg total) by mouth at  bedtime. 10/23/21   Kathrin Ruddy, RPH-CPP  metFORMIN (GLUCOPHAGE-XR) 500 MG 24 hr tablet TAKE 2 TABLETS BY MOUTH TWICE DAILY 06/02/22   Westley Chandler, MD  Semaglutide, 1 MG/DOSE, (OZEMPIC, 1 MG/DOSE,) 4 MG/3ML SOPN Inject 1 mg into the skin once a week. 09/14/21   Westley Chandler, MD  senna (SENOKOT) 8.6 MG TABS tablet Take 1 tablet (8.6 mg total) by mouth daily. Patient not taking: Reported on 08/10/2022 07/23/22   Alicia Amel, MD  terconazole (TERAZOL 7) 0.4 % vaginal cream Apply twice daily to vulvovaginal area, can reapply after every void, use for 7 days as needed 09/23/22   Theadora Rama Scales, PA-C  terconazole (TERAZOL 7) 0.4 % vaginal cream Place 1 applicator vaginally at bedtime. 09/27/22   Theadora Rama Scales, PA-C  triamcinolone (NASACORT) 55 MCG/ACT AERO nasal inhaler Place 2 sprays into the nose daily. 03/03/19   [provider]    Family History Family History  Problem Relation Age of Onset   Diabetes Mother    Hypertension Mother    Alzheimer's disease Father    Dementia Father    Ovarian cancer Other        aunt uncertain age   Social History Social History   Tobacco Use   Smoking status: Former    Packs/day: 0.25    Years: 30.00    Additional pack years: 0.00    Total pack years: 7.50    Types: Cigarettes    Quit date: 08/13/2007    Years since quitting: 15.1   Smokeless tobacco: Never   Tobacco comments:    boyfriend smokes  Vaping Use   Vaping Use: Never used  Substance Use Topics   Alcohol use: No    Comment: rarely   Drug use: No   Allergies   Ace inhibitors and Metformin and related  Review of Systems Review of Systems Pertinent findings revealed after performing a 14 point review of systems has been noted in the history of present illness.  Physical Exam Vital Signs BP 136/77 (BP Location: Right Arm)   Pulse 85   Temp 98.9 F (37.2 C) (Oral)   Resp 20   LMP 07/15/2015 (Exact Date)   SpO2 95%   No data found.  Physical  Exam Vitals and nursing note reviewed.  Constitutional:      General: She is not in acute distress.    Appearance: Normal appearance. She is not ill-appearing.  HENT:     Head: Normocephalic and atraumatic.     Salivary Glands: Right salivary gland is not diffusely enlarged or tender. Left salivary gland is not diffusely enlarged or tender.     Right Ear: Ear canal and external ear normal. No drainage. A middle ear effusion is  present. There is no impacted cerumen. Tympanic membrane is bulging. Tympanic membrane is not injected or erythematous.     Left Ear: Ear canal and external ear normal. No drainage. A middle ear effusion is present. There is no impacted cerumen. Tympanic membrane is bulging. Tympanic membrane is not injected or erythematous.     Ears:     Comments: Bilateral EACs normal, both TMs bulging with clear fluid    Nose: Rhinorrhea present. No nasal deformity, septal deviation, signs of injury, nasal tenderness, mucosal edema or congestion. Rhinorrhea is clear.     Right Nostril: Occlusion present. No foreign body, epistaxis or septal hematoma.     Left Nostril: Occlusion present. No foreign body, epistaxis or septal hematoma.     Right Turbinates: Enlarged, swollen and pale.     Left Turbinates: Enlarged, swollen and pale.     Right Sinus: No maxillary sinus tenderness or frontal sinus tenderness.     Left Sinus: No maxillary sinus tenderness or frontal sinus tenderness.     Mouth/Throat:     Lips: Pink. No lesions.     Mouth: Mucous membranes are moist. No oral lesions.     Pharynx: Oropharynx is clear. Uvula midline. No posterior oropharyngeal erythema or uvula swelling.     Tonsils: No tonsillar exudate. 0 on the right. 0 on the left.     Comments: Postnasal drip Eyes:     General: Lids are normal.        Right eye: No discharge.        Left eye: No discharge.     Extraocular Movements: Extraocular movements intact.     Conjunctiva/sclera: Conjunctivae normal.      Right eye: Right conjunctiva is not injected.     Left eye: Left conjunctiva is not injected.  Neck:     Trachea: Trachea and phonation normal.  Cardiovascular:     Rate and Rhythm: Normal rate and regular rhythm.     Pulses: Normal pulses.     Heart sounds: Normal heart sounds. No murmur heard.    No friction rub. No gallop.  Pulmonary:     Effort: Pulmonary effort is normal. Prolonged expiration present. No tachypnea, bradypnea, accessory muscle usage, respiratory distress or retractions.     Breath sounds: No stridor, decreased air movement or transmitted upper airway sounds. Examination of the right-upper field reveals wheezing. Examination of the left-upper field reveals wheezing. Wheezing present. No decreased breath sounds, rhonchi or rales.     Comments: Repeat auscultation post nebulized bronchodilator revealed improved work of breathing and resolution of wheezing. Chest:     Chest wall: No tenderness.  Musculoskeletal:        General: Normal range of motion.     Cervical back: Normal range of motion and neck supple. Normal range of motion.  Lymphadenopathy:     Cervical: No cervical adenopathy.  Skin:    General: Skin is warm and dry.     Findings: No erythema or rash.  Neurological:     General: No focal deficit present.     Mental Status: She is alert and oriented to person, place, and time.  Psychiatric:        Mood and Affect: Mood normal.        Behavior: Behavior normal.     Visual Acuity Right Eye Distance:   Left Eye Distance:   Bilateral Distance:    Right Eye Near:   Left Eye Near:    Bilateral Near:  UC Couse / Diagnostics / Procedures:     Radiology No results found.  Procedures Procedures (including critical care time) EKG  Pending results:  Labs Reviewed - No data to display  Medications Ordered in UC: Medications  ipratropium-albuterol (DUONEB) 0.5-2.5 (3) MG/3ML nebulizer solution 3 mL (3 mLs Nebulization Given 10/17/22 1009)     UC Diagnoses / Final Clinical Impressions(s)   I have reviewed the triage vital signs and the nursing notes.  Pertinent labs & imaging results that were available during my care of the patient were reviewed by me and considered in my medical decision making (see chart for details).    Final diagnoses:  Acute exacerbation of chronic obstructive pulmonary disease (COPD) (HCC)  Gastroesophageal reflux disease without esophagitis  Seasonal allergic rhinitis, unspecified trigger   Patient advised that on physical exam I did not appreciate any physical evidence of influenza or COVID-19 viral infection at this time and that influenza testing is not needed.  Patient advised she likely is experiencing exacerbation of COPD secondary to uncontrolled allergies.  She was also advised that her cough may be due to uncontrolled reflux.  Therefore, patient was provided with a refill of her previously prescribed Nexium along with an albuterol inhaler and she was advised to take Zyrtec and Flonase daily.  Prescriptions for all for these medications were sent to her pharmacy.  Conservative care recommended.  Return precautions advised.  Note provided to return to work tomorrow.  Please see discharge instructions below for further details of plan of care as provided to patient. ED Prescriptions     Medication Sig Dispense Auth. Provider   esomeprazole (NEXIUM) 40 MG capsule Take 1 capsule (40 mg total) by mouth daily at 12 noon. 90 capsule Theadora Rama Scales, PA-C   albuterol (VENTOLIN HFA) 108 (90 Base) MCG/ACT inhaler Inhale 2 puffs into the lungs every 6 (six) hours as needed for wheezing or shortness of breath (Cough). 18 g Theadora Rama Scales, PA-C   Spacer/Aero-Holding Chambers (AEROCHAMBER PLUS FLO-VU LARGE) MISC 1 each by Other route once for 1 dose. 1 each Theadora Rama Scales, PA-C   promethazine-dextromethorphan (PROMETHAZINE-DM) 6.25-15 MG/5ML syrup Take 5 mLs by mouth at bedtime as needed  for cough. 60 mL Theadora Rama Scales, PA-C   cetirizine (ZYRTEC ALLERGY) 10 MG tablet Take 1 tablet (10 mg total) by mouth at bedtime. 90 tablet Theadora Rama Scales, PA-C   fluticasone (FLONASE) 50 MCG/ACT nasal spray Place 1 spray into both nostrils daily. 47.4 mL Theadora Rama Scales, PA-C      PDMP not reviewed this encounter.  Disposition Upon Discharge:  Condition: stable for discharge home Home: take medications as prescribed; routine discharge instructions as discussed; follow up as advised.  Patient presented with an acute illness with associated systemic symptoms and significant discomfort requiring urgent management. In my opinion, this is a condition that a prudent lay person (someone who possesses an average knowledge of health and medicine) may potentially expect to result in complications if not addressed urgently such as respiratory distress, impairment of bodily function or dysfunction of bodily organs.   Routine symptom specific, illness specific and/or disease specific instructions were discussed with the patient and/or caregiver at length.   As such, the patient has been evaluated and assessed, work-up was performed and treatment was provided in alignment with urgent care protocols and evidence based medicine.  Patient/parent/caregiver has been advised that the patient may require follow up for further testing and treatment if the symptoms continue in spite  of treatment, as clinically indicated and appropriate.  Patient/parent/caregiver has been advised to return to the [facility] or PCP in 3-5 days if no better; to PCP or the Emergency Department if new signs and symptoms develop, or if the current signs or symptoms continue to change or worsen for further workup, evaluation and treatment as clinically indicated and appropriate  The patient will follow up with their current PCP if and as advised. If the patient does not currently have a PCP we will assist them in obtaining  one.   The patient may need specialty follow up if the symptoms continue, in spite of conservative treatment and management, for further workup, evaluation, consultation and treatment as clinically indicated and appropriate.  Patient/parent/caregiver verbalized understanding and agreement of plan as discussed.  All questions were addressed during visit.  Please see discharge instructions below for further details of plan.  Discharge Instructions:   Discharge Instructions      Please read below to learn more about the medications, dosages and frequencies that I recommend to help alleviate your symptoms and to get you feeling better soon:  Zyrtec (cetirizine): This is an excellent second-generation antihistamine that helps to reduce respiratory inflammatory response to environmental allergens.  In some patients, this medication can cause daytime sleepiness so I recommend that you take 1 tablet daily at bedtime.     Flonase (fluticasone): This is a steroid nasal spray that used once daily, 1 spray in each nare.  This works best when used on a daily basis. This medication does not work well if it is only used when you think you need it.  After 3 to 5 days of use, you will notice significant reduction of the inflammation and mucus production that is currently being caused by exposure to allergens, whether seasonal or environmental.  The most common side effect of this medication is nosebleeds.  If you experience a nosebleed, please discontinue use for 1 week, then feel free to resume.  If you find that your insurance will not pay for this medication, please consider a different nasal steroids such as Nasonex (mometasone), or Nasacort (triamcinolone).   ProAir, Ventolin, Proventil (albuterol): This inhaled medication contains a short acting beta agonist bronchodilator.  This medication relaxes the smooth muscle of the airway in the lungs.  When these muscles are tight, breathing becomes more constricted.   The result of relaxation of the smooth muscle is increased air movement and improved work of breathing.  This is a short acting medication that can be used every 4-6 hours as needed for increased work of breathing, shortness of breath, wheezing and excessive coughing.  It comes in the form of a handheld inhaler or nebulizer solution.  I recommended that for the next 3 to 4 days, this medication is used 4 times daily on a scheduled basis then decrease to twice daily and as needed until symptoms have completely resolved which I anticipate will be several weeks.   Promethazine DM: Promethazine is both a nasal decongestant that dries up mucous membranes and an antinausea medication.  Promethazine often makes most patients feel fairly sleepy.  "DM" is dextromethorphan, a single symptom reliever which is a cough suppressant found in many over-the-counter cough medications and combination cold preparations.  Please take 5 mL before bedtime to minimize your cough which will help you sleep better.  I have sent a prescription for this medication to your pharmacy because it cannot be purchased over-the-counter.  You are welcome to continue DayQuil and honey  Robitussin as needed and as tolerated.  I have also provided you with a refill of your Nexium antibiotic for you to take daily for the next 3 months to keep your heartburn well-controlled.   If symptoms have not meaningfully improved in the next 5 to 7 days, please return for repeat evaluation or follow-up with your regular provider.  If symptoms have worsened in the next 3 to 5 days, please return for repeat evaluation or follow-up with your regular provider.    Thank you for visiting urgent care today.  We appreciate the opportunity to participate in your care.       This office note has been dictated using Teaching laboratory technician.  Unfortunately, this method of dictation can sometimes lead to typographical or grammatical errors.  I apologize  for your inconvenience in advance if this occurs.  Please do not hesitate to reach out to me if clarification is needed.      Theadora Rama Scales, PA-C 10/17/22 1113

## 2022-10-17 NOTE — ED Triage Notes (Signed)
Pt c/o fever and emesis x1-2 days. Tmax (101.76f). Partner has recently been sick x1 week. C/o chills, hot flashes, cough. Minimal relief with DayQuil and Robitussin.

## 2022-10-17 NOTE — Discharge Instructions (Addendum)
Please read below to learn more about the medications, dosages and frequencies that I recommend to help alleviate your symptoms and to get you feeling better soon:  Zyrtec (cetirizine): This is an excellent second-generation antihistamine that helps to reduce respiratory inflammatory response to environmental allergens.  In some patients, this medication can cause daytime sleepiness so I recommend that you take 1 tablet daily at bedtime.     Flonase (fluticasone): This is a steroid nasal spray that used once daily, 1 spray in each nare.  This works best when used on a daily basis. This medication does not work well if it is only used when you think you need it.  After 3 to 5 days of use, you will notice significant reduction of the inflammation and mucus production that is currently being caused by exposure to allergens, whether seasonal or environmental.  The most common side effect of this medication is nosebleeds.  If you experience a nosebleed, please discontinue use for 1 week, then feel free to resume.  If you find that your insurance will not pay for this medication, please consider a different nasal steroids such as Nasonex (mometasone), or Nasacort (triamcinolone).   ProAir, Ventolin, Proventil (albuterol): This inhaled medication contains a short acting beta agonist bronchodilator.  This medication relaxes the smooth muscle of the airway in the lungs.  When these muscles are tight, breathing becomes more constricted.  The result of relaxation of the smooth muscle is increased air movement and improved work of breathing.  This is a short acting medication that can be used every 4-6 hours as needed for increased work of breathing, shortness of breath, wheezing and excessive coughing.  It comes in the form of a handheld inhaler or nebulizer solution.  I recommended that for the next 3 to 4 days, this medication is used 4 times daily on a scheduled basis then decrease to twice daily and as needed until  symptoms have completely resolved which I anticipate will be several weeks.   Promethazine DM: Promethazine is both a nasal decongestant that dries up mucous membranes and an antinausea medication.  Promethazine often makes most patients feel fairly sleepy.  "DM" is dextromethorphan, a single symptom reliever which is a cough suppressant found in many over-the-counter cough medications and combination cold preparations.  Please take 5 mL before bedtime to minimize your cough which will help you sleep better.  I have sent a prescription for this medication to your pharmacy because it cannot be purchased over-the-counter.  You are welcome to continue DayQuil and honey Robitussin as needed and as tolerated.  I have also provided you with a refill of your Nexium antibiotic for you to take daily for the next 3 months to keep your heartburn well-controlled.   If symptoms have not meaningfully improved in the next 5 to 7 days, please return for repeat evaluation or follow-up with your regular provider.  If symptoms have worsened in the next 3 to 5 days, please return for repeat evaluation or follow-up with your regular provider.    Thank you for visiting urgent care today.  We appreciate the opportunity to participate in your care.

## 2022-11-02 NOTE — Telephone Encounter (Signed)
Patient presents to office for medication shipment. Provided patient with medication per note from [facility].   Veronda Prude, RN

## 2022-11-05 ENCOUNTER — Ambulatory Visit
Admission: EM | Admit: 2022-11-05 | Discharge: 2022-11-05 | Disposition: A | Discharge status: Home / Self Care | Payer: Medicaid Other | Attending: Internal Medicine | Admitting: Internal Medicine

## 2022-11-05 DIAGNOSIS — N898 Other specified noninflammatory disorders of vagina: Secondary | ICD-10-CM | POA: Insufficient documentation

## 2022-11-05 DIAGNOSIS — I10 Essential (primary) hypertension: Secondary | ICD-10-CM | POA: Insufficient documentation

## 2022-11-05 DIAGNOSIS — R300 Dysuria: Secondary | ICD-10-CM | POA: Insufficient documentation

## 2022-11-05 DIAGNOSIS — R1030 Lower abdominal pain, unspecified: Secondary | ICD-10-CM | POA: Diagnosis not present

## 2022-11-05 LAB — POCT URINALYSIS DIP (MANUAL ENTRY)
Bilirubin, UA: NEGATIVE
Blood, UA: NEGATIVE
Glucose, UA: NEGATIVE mg/dL
Ketones, POC UA: NEGATIVE mg/dL
Leukocytes, UA: NEGATIVE
Nitrite, UA: NEGATIVE
Protein Ur, POC: 30 mg/dL — AB
Spec Grav, UA: 1.025 (ref 1.010–1.025)
Urobilinogen, UA: 0.2 E.U./dL
pH, UA: 6 (ref 5.0–8.0)

## 2022-11-05 MED ORDER — CEFDINIR 300 MG PO CAPS: 300.0000 mg | ORAL_CAPSULE | Freq: Two times a day (BID) | ORAL | 0 refills | Status: AC

## 2022-11-05 NOTE — ED Provider Notes (Signed)
[facility]  Note:  This document was prepared using Dragon voice recognition software and may include unintentional dictation errors.  MRN: [id number] DOB: 07-05-1960 DATE: 11/05/22   Subjective:  Chief Complaint:  Chief Complaint  Patient presents with   Abdominal Cramping    HPI: Carmen [name] is a 62 y.o. female presenting for dysuria and lower abdominal pain. She has a PMH significant for T2DM, HTN, and borderline personality disorder. She reports slightly vaginal discharge as well, but states she is unsure if this is because she has not changed in her clothes in 2 days or if she is having new discharge. Denies vaginal discharge and vaginal odor. Of note, she has been being seen weekly for the past month for similar vaginal discharge. Last cervicovaginal swab was negative on 10/08/2022. She has been taking boric acid vaginal suppositories since then and has only skipped one day of them. She reports one episode of vomiting 2 days ago after eating a cold biscuit from [employer]. She describes dysuria as a burning sensation with urination. Denies fever, nausea. Endorses dysuria, lower abdominal pain. Presents NAD.  Prior to Admission medications   Medication Sig Start Date End Date Taking? Authorizing Provider  cefdinir (OMNICEF) 300 MG capsule Take 1 capsule (300 mg total) by mouth 2 (two) times daily for 5 days. 11/05/22 11/10/22 Yes Tu Shimmel P, PA-C  albuterol (VENTOLIN HFA) 108 (90 Base) MCG/ACT inhaler Inhale 2 puffs into the lungs every 6 (six) hours as needed for wheezing or shortness of breath (Cough). 10/17/22   Theadora Rama Scales, PA-C  amLODipine (NORVASC) 10 MG tablet Take 1 tablet (10 mg total) by mouth at bedtime. 01/01/22   Billey Co, MD  Bempedoic Acid 180 MG TABS Take 1 tablet (180 mg total) by mouth daily. 08/25/22   Westley Chandler, MD  carvedilol (COREG) 25 MG tablet Take 1 tablet (25 mg total) by mouth 2 (two) times daily with a meal. 01/01/22    Pray, Milus Mallick, MD  cetirizine (ZYRTEC ALLERGY) 10 MG tablet Take 1 tablet (10 mg total) by mouth at bedtime. 10/17/22 04/15/23  Theadora Rama Scales, PA-C  escitalopram (LEXAPRO) 10 MG tablet Take 2 tablets (20 mg total) by mouth daily. 09/21/22   Westley Chandler, MD  esomeprazole (NEXIUM) 40 MG capsule Take 1 capsule (40 mg total) by mouth daily at 12 noon. 10/17/22 01/15/23  Theadora Rama Scales, PA-C  ezetimibe (ZETIA) 10 MG tablet Take 1 tablet (10 mg total) by mouth daily. 08/26/22   Westley Chandler, MD  fluconazole (DIFLUCAN) 150 MG tablet Take first tablet on day 1.  Take second tablet 3 days after first tablet.  Take third tablet 3 days after second tablet. 09/23/22   Theadora Rama Scales, PA-C  fluticasone (FLONASE) 50 MCG/ACT nasal spray Place 1 spray into both nostrils daily. 10/17/22   Theadora Rama Scales, PA-C  gabapentin (NEURONTIN) 300 MG capsule Take 2 capsules (600 mg total) by mouth at bedtime. 09/21/22   Westley Chandler, MD  hydrochlorothiazide (HYDRODIURIL) 12.5 MG tablet Take 1 tablet (12.5 mg total) by mouth daily. 06/02/22   Westley Chandler, MD  Insulin Pen Needle (BD PEN NEEDLE MICRO U/F) 32G X 6 MM MISC Use to inject ozempic 09/14/21   Westley Chandler, MD  losartan (COZAAR) 100 MG tablet Take 1 tablet (100 mg total) by mouth at bedtime. 10/23/21   Kathrin Ruddy, RPH-CPP  metFORMIN (GLUCOPHAGE-XR) 500 MG 24 hr tablet TAKE 2 TABLETS BY  MOUTH TWICE DAILY 06/02/22   Westley Chandler, MD  promethazine-dextromethorphan (PROMETHAZINE-DM) 6.25-15 MG/5ML syrup Take 5 mLs by mouth at bedtime as needed for cough. 10/17/22   Theadora Rama Scales, PA-C  Semaglutide, 1 MG/DOSE, (OZEMPIC, 1 MG/DOSE,) 4 MG/3ML SOPN Inject 1 mg into the skin once a week. 09/14/21   Westley Chandler, MD  senna (SENOKOT) 8.6 MG TABS tablet Take 1 tablet (8.6 mg total) by mouth daily. Patient not taking: Reported on 08/10/2022 07/23/22   Alicia Amel, MD  terconazole (TERAZOL 7) 0.4 % vaginal cream Apply twice  daily to vulvovaginal area, can reapply after every void, use for 7 days as needed 09/23/22   Theadora Rama Scales, PA-C  terconazole (TERAZOL 7) 0.4 % vaginal cream Place 1 applicator vaginally at bedtime. 09/27/22   Theadora Rama Scales, PA-C     Allergies  Allergen Reactions   Ace Inhibitors Cough    Other reaction(s): Cough (ALLERGY/intolerance)   Metformin And Related Diarrhea    Able to tolerate XR with only mild symptoms     History:   Past Medical History:  Diagnosis Date   Adhesive capsulitis of shoulder    Allergy    Borderline personality disorder (HCC)    Carpal tunnel syndrome    COPD (chronic obstructive pulmonary disease) (HCC)    Depression    GERD (gastroesophageal reflux disease)    Hypertension    Nephrolithiasis    Type 2 diabetes mellitus (HCC)      Past Surgical History:  Procedure Laterality Date   CHOLECYSTECTOMY     TONSILLECTOMY      Family History  Problem Relation Age of Onset   Diabetes Mother    Hypertension Mother    Alzheimer's disease Father    Dementia Father    Ovarian cancer Other        aunt uncertain age    Social History   Tobacco Use   Smoking status: Former    Packs/day: 0.25    Years: 30.00    Additional pack years: 0.00    Total pack years: 7.50    Types: Cigarettes    Quit date: 08/13/2007    Years since quitting: 15.2   Smokeless tobacco: Never   Tobacco comments:    boyfriend smokes  Vaping Use   Vaping Use: Never used  Substance Use Topics   Alcohol use: No    Comment: rarely   Drug use: No    Review of Systems  Constitutional:  Negative for fever.  Gastrointestinal:  Positive for abdominal pain and vomiting. Negative for nausea.  Genitourinary:  Positive for dysuria. Negative for flank pain, vaginal bleeding and vaginal discharge.     Objective:   Vitals: BP (!) 155/85 (BP Location: Right Arm) Comment: pt states she has not taken her BP meds today  Pulse 75   Temp 97.8 F (36.6 C) (Oral)    Resp 18   LMP 07/15/2015 (Exact Date)   SpO2 98%   Physical Exam Constitutional:      General: She is not in acute distress.    Appearance: Normal appearance. She is well-developed. She is obese. She is not ill-appearing or toxic-appearing.  HENT:     Head: Normocephalic and atraumatic.  Cardiovascular:     Rate and Rhythm: Normal rate and regular rhythm.     Heart sounds: Normal heart sounds.  Pulmonary:     Effort: Pulmonary effort is normal.     Breath sounds: Normal breath sounds.  Comments: Clear to auscultation bilaterally  Abdominal:     General: Bowel sounds are normal.     Palpations: Abdomen is soft.     Tenderness: There is no abdominal tenderness. There is no right CVA tenderness or left CVA tenderness.     Comments: No acute abdomen.  Musculoskeletal:     Lumbar back: Normal.  Skin:    General: Skin is warm and dry.  Neurological:     General: No focal deficit present.     Mental Status: She is alert.  Psychiatric:        Mood and Affect: Mood and affect normal.     Results:  Labs: Results for orders placed or performed during the hospital encounter of 11/05/22 (from the past 24 hour(s))  POCT urinalysis dipstick     Status: Abnormal   Collection Time: 11/05/22  8:42 AM  Result Value Ref Range   Color, UA yellow yellow   Clarity, UA clear clear   Glucose, UA negative negative mg/dL   Bilirubin, UA negative negative   Ketones, POC UA negative negative mg/dL   Spec Grav, UA 1.610 9.604 - 1.025   Blood, UA negative negative   pH, UA 6.0 5.0 - 8.0   Protein Ur, POC =30 (A) negative mg/dL   Urobilinogen, UA 0.2 0.2 or 1.0 E.U./dL   Nitrite, UA Negative Negative   Leukocytes, UA Negative Negative    Radiology: No results found.   UC Course/Treatments:  Procedures: Procedures   Medications Ordered in UC: Medications - No data to display   Assessment and Plan :     ICD-10-CM   1. Dysuria  R30.0 Urine Culture    Urine Culture    2. Vaginal  discharge  N89.8     3. Lower abdominal pain  R10.30     4. Essential hypertension  I10      Dysuria: Afebrile, nontoxic-appearing, NAD. VSS. DDX includes but not limited to: cystitis, BV, candidiasis, gonorrhea, chlamydia UA was unremarkable today in office.  Urine culture is pending.  Given lower abdominal pain with dysuria, Cefdinir 300 mg twice daily was prescribed while awaiting urine culture and cytology results.  If urine culture is negative, Rx can be stopped at that time.  Strict ED precautions were given and patient verbalized understanding.  Vaginal discharge: Afebrile, nontoxic-appearing, NAD. VSS. DDX includes but not limited to: BV, candidiasis, gonorrhea, chlamydia Cytology is pending. Patient states she is unsure if she is having new vaginal discharge or symptoms are from where she has not changed her clothes. Cytology on 10/08/2022 was negative and she has been taking boric acid suppositories since that visit. Do not feel ABX is needed at this time. RX can be sent depending on cytology results. Recommend she follow up with GYN. Strict ED precautions were given and patient verbalized understanding.  Lower abdominal pain: Afebrile, nontoxic-appearing, NAD. VSS. DDX includes but not limited to: cystitis, BV, candidiasis, PID, malignancy UA was unremarkable today in office.  Urine culture is pending.  Given lower abdominal pain with dysuria, Cefdinir 300 mg twice daily was prescribed while awaiting urine culture and cytology results.  If urine culture is negative, Rx can be stopped at that time.  Recommend follow up with PCP. Strict ED precautions were given and patient verbalized understanding.  Essential hypertension Previously diagnosed. Active. Uncontrolled. BP elevated today's visit.  Patient states she has not taken her medication at this time. Recommend she take amlodipine 10 mg daily, HCTZ 12 mg daily, and  losartan 100 mg daily as directed.  Recommend follow-up with PCP  as directed.    ED Discharge Orders          Ordered    cefdinir (OMNICEF) 300 MG capsule  2 times daily        11/05/22 0845             PDMP not reviewed this encounter.     Cynda Acres, PA-C 11/05/22 0981

## 2022-11-05 NOTE — Discharge Instructions (Signed)
Your urine sample was sent for further testing as well as you vaginal swab. You were prescribed Cefdinir which is an antibiotic that is often used to treat urinary tract infections.  Take the prescription as directed. A urine culture has been sent to the lab for further testing.  We will call you with those results.  If the prescription needs to be changed, it will be done so at that time.   Return in 2 to 3 days if no improvement. Please go directly to the Emergency Department immediately should you begin to have any of the following symptoms: persistent fevers, increased pain or persistent nausea/vomiting.

## 2022-11-05 NOTE — ED Triage Notes (Addendum)
Pt c/o states she continues to feel unwell with c/o cough; stomach aching past few days, V a few times, floaters in eyes, vaginal discharge, vaginal soreness.

## 2022-11-06 LAB — URINE CULTURE: Culture: 20000 — AB

## 2022-11-08 ENCOUNTER — Telehealth: Payer: Self-pay

## 2022-11-08 ENCOUNTER — Encounter: Payer: Self-pay | Admitting: Family Medicine

## 2022-11-08 LAB — CERVICOVAGINAL ANCILLARY ONLY
Bacterial Vaginitis (gardnerella): NEGATIVE
Candida Glabrata: NEGATIVE
Candida Vaginitis: NEGATIVE
Chlamydia: NEGATIVE
Comment: NEGATIVE
Comment: NEGATIVE
Comment: NEGATIVE
Comment: NEGATIVE
Comment: NEGATIVE
Comment: NORMAL
Neisseria Gonorrhea: NEGATIVE
Trichomonas: NEGATIVE

## 2022-11-08 NOTE — Telephone Encounter (Signed)
Pt called to inquire about lab results. Cytology pending. MyChart sent to pt informing her that she would be contacted when resulted were final.

## 2022-11-17 ENCOUNTER — Ambulatory Visit: Payer: Medicaid Other | Admitting: Family Medicine

## 2022-11-22 ENCOUNTER — Other Ambulatory Visit: Payer: Self-pay

## 2022-11-22 DIAGNOSIS — K581 Irritable bowel syndrome with constipation: Secondary | ICD-10-CM

## 2022-11-22 MED ORDER — ESCITALOPRAM OXALATE 10 MG PO TABS: 20.0000 mg | ORAL_TABLET | Freq: Every day | ORAL | 2 refills | Status: DC

## 2022-12-08 ENCOUNTER — Ambulatory Visit: Payer: Medicaid Other | Admitting: Family Medicine

## 2022-12-27 ENCOUNTER — Ambulatory Visit
Admission: EM | Admit: 2022-12-27 | Discharge: 2022-12-27 | Disposition: A | Discharge status: Home / Self Care | Payer: Medicaid Other | Attending: Internal Medicine | Admitting: Internal Medicine

## 2022-12-27 DIAGNOSIS — N898 Other specified noninflammatory disorders of vagina: Secondary | ICD-10-CM | POA: Diagnosis not present

## 2022-12-27 DIAGNOSIS — B3731 Acute candidiasis of vulva and vagina: Secondary | ICD-10-CM | POA: Insufficient documentation

## 2022-12-27 LAB — POCT URINALYSIS DIP (MANUAL ENTRY)
Bilirubin, UA: NEGATIVE
Blood, UA: NEGATIVE
Glucose, UA: NEGATIVE mg/dL
Ketones, POC UA: NEGATIVE mg/dL
Nitrite, UA: NEGATIVE
Protein Ur, POC: 100 mg/dL — AB
Spec Grav, UA: >=1.03 — AB (ref 1.010–1.025)
Urobilinogen, UA: 0.2 E.U./dL
pH, UA: 5.5 (ref 5.0–8.0)

## 2022-12-27 MED ORDER — FLUCONAZOLE 150 MG PO TABS: 150.0000 mg | ORAL_TABLET | Freq: Every day | ORAL | 0 refills | Status: AC

## 2022-12-27 MED ORDER — NYSTATIN 100000 UNIT/GM EX OINT: 1.0000 | TOPICAL_OINTMENT | Freq: Two times a day (BID) | CUTANEOUS | 0 refills | Status: DC | PRN

## 2022-12-27 NOTE — ED Triage Notes (Addendum)
Pt c/o few weeks up vaginal dryness and soreness painful to wipe, pricking to the side of vaginal opening, felt bump at opening of vagina, painful intercourse. Frequency at times and feels like she needs to push urine out.

## 2022-12-27 NOTE — ED Provider Notes (Signed)
[facility]  Note:  This document was prepared using Dragon voice recognition software and may include unintentional dictation errors.  MRN: [id number] DOB: May 24, 1961 DATE: 12/27/22   Subjective:  Chief Complaint:  Chief Complaint  Patient presents with   Vaginitis    HPI: Carmen [name] is a 62 y.o. female presenting for vaginal dryness and soreness for the past couple of weeks. Reports irritation and burning with wiping. She is concerned that she may have felt a "bump" at her vaginal opening. Denies vaginal discharge or vaginal odor. She reports some urinary frequency as well, but no dysuria. She states she is a diabetic and has been eating more sugary foods lately. Concerned for yeast infection. Denies fever, nausea/vomiting, abdominal pain, dysuria, vaginal discharge, vaginal odor. Endorses vaginal dryness, vaginal irritation. Presents NAD.  Prior to Admission medications   Medication Sig Start Date End Date Taking? Authorizing Provider  albuterol (VENTOLIN HFA) 108 (90 Base) MCG/ACT inhaler Inhale 2 puffs into the lungs every 6 (six) hours as needed for wheezing or shortness of breath (Cough). 10/17/22   Theadora Rama Scales, PA-C  amLODipine (NORVASC) 10 MG tablet Take 1 tablet (10 mg total) by mouth at bedtime. 01/01/22   Billey Co, MD  Bempedoic Acid 180 MG TABS Take 1 tablet (180 mg total) by mouth daily. 08/25/22   Westley Chandler, MD  carvedilol (COREG) 25 MG tablet Take 1 tablet (25 mg total) by mouth 2 (two) times daily with a meal. 01/01/22   Pray, Milus Mallick, MD  cetirizine (ZYRTEC ALLERGY) 10 MG tablet Take 1 tablet (10 mg total) by mouth at bedtime. 10/17/22 04/15/23  Theadora Rama Scales, PA-C  escitalopram (LEXAPRO) 10 MG tablet Take 2 tablets (20 mg total) by mouth daily. 11/22/22   Westley Chandler, MD  esomeprazole (NEXIUM) 40 MG capsule Take 1 capsule (40 mg total) by mouth daily at 12 noon. 10/17/22 01/15/23  Theadora Rama Scales, PA-C  ezetimibe  (ZETIA) 10 MG tablet Take 1 tablet (10 mg total) by mouth daily. 08/26/22   Westley Chandler, MD  fluconazole (DIFLUCAN) 150 MG tablet Take first tablet on day 1.  Take second tablet 3 days after first tablet.  Take third tablet 3 days after second tablet. 09/23/22   Theadora Rama Scales, PA-C  fluticasone (FLONASE) 50 MCG/ACT nasal spray Place 1 spray into both nostrils daily. 10/17/22   Theadora Rama Scales, PA-C  gabapentin (NEURONTIN) 300 MG capsule Take 2 capsules (600 mg total) by mouth at bedtime. 09/21/22   Westley Chandler, MD  hydrochlorothiazide (HYDRODIURIL) 12.5 MG tablet Take 1 tablet (12.5 mg total) by mouth daily. 06/02/22   Westley Chandler, MD  Insulin Pen Needle (BD PEN NEEDLE MICRO U/F) 32G X 6 MM MISC Use to inject ozempic 09/14/21   Westley Chandler, MD  losartan (COZAAR) 100 MG tablet Take 1 tablet (100 mg total) by mouth at bedtime. 10/23/21   Kathrin Ruddy, RPH-CPP  metFORMIN (GLUCOPHAGE-XR) 500 MG 24 hr tablet TAKE 2 TABLETS BY MOUTH TWICE DAILY 06/02/22   Westley Chandler, MD  promethazine-dextromethorphan (PROMETHAZINE-DM) 6.25-15 MG/5ML syrup Take 5 mLs by mouth at bedtime as needed for cough. 10/17/22   Theadora Rama Scales, PA-C  Semaglutide, 1 MG/DOSE, (OZEMPIC, 1 MG/DOSE,) 4 MG/3ML SOPN Inject 1 mg into the skin once a week. 09/14/21   Westley Chandler, MD  senna (SENOKOT) 8.6 MG TABS tablet Take 1 tablet (8.6 mg total) by mouth daily. Patient not taking: Reported  on 08/10/2022 07/23/22   Alicia Amel, MD  terconazole (TERAZOL 7) 0.4 % vaginal cream Apply twice daily to vulvovaginal area, can reapply after every void, use for 7 days as needed 09/23/22   Theadora Rama Scales, PA-C  terconazole (TERAZOL 7) 0.4 % vaginal cream Place 1 applicator vaginally at bedtime. 09/27/22   Theadora Rama Scales, PA-C     Allergies  Allergen Reactions   Ace Inhibitors Cough    Other reaction(s): Cough (ALLERGY/intolerance)   Metformin And Related Diarrhea    Able to tolerate XR with  only mild symptoms     History:   Past Medical History:  Diagnosis Date   Adhesive capsulitis of shoulder    Allergy    Borderline personality disorder (HCC)    Carpal tunnel syndrome    COPD (chronic obstructive pulmonary disease) (HCC)    Depression    GERD (gastroesophageal reflux disease)    Hypertension    Nephrolithiasis    Type 2 diabetes mellitus (HCC)      Past Surgical History:  Procedure Laterality Date   CHOLECYSTECTOMY     TONSILLECTOMY      Family History  Problem Relation Age of Onset   Diabetes Mother    Hypertension Mother    Alzheimer's disease Father    Dementia Father    Ovarian cancer Other        aunt uncertain age    Social History   Tobacco Use   Smoking status: Former    Current packs/day: 0.00    Average packs/day: 0.3 packs/day for 30.0 years (7.5 ttl pk-yrs)    Types: Cigarettes    Start date: 08/12/1977    Quit date: 08/13/2007    Years since quitting: 15.3   Smokeless tobacco: Never   Tobacco comments:    boyfriend smokes  Vaping Use   Vaping status: Never Used  Substance Use Topics   Alcohol use: No    Comment: rarely   Drug use: No    Review of Systems  Constitutional:  Negative for fever.  Gastrointestinal:  Negative for abdominal pain, nausea and vomiting.  Genitourinary:  Positive for dyspareunia and frequency. Negative for dysuria, vaginal bleeding, vaginal discharge and vaginal pain.     Objective:   Vitals: BP (!) 164/85 (BP Location: Right Arm)   Pulse 72   Temp 98.2 F (36.8 C) (Oral)   Resp 16   LMP 07/15/2015 (Exact Date)   SpO2 97%   Physical Exam Exam conducted with a chaperone present.  Constitutional:      General: She is not in acute distress.    Appearance: Normal appearance. She is well-developed. She is obese. She is not ill-appearing or toxic-appearing.  HENT:     Head: Normocephalic and atraumatic.  Cardiovascular:     Rate and Rhythm: Normal rate and regular rhythm.     Heart sounds:  Normal heart sounds.  Pulmonary:     Effort: Pulmonary effort is normal.     Breath sounds: Normal breath sounds.     Comments: Clear to auscultation bilaterally  Abdominal:     General: Bowel sounds are normal.     Palpations: Abdomen is soft.     Tenderness: There is no abdominal tenderness.  Genitourinary:    Labia:        Right: No rash or lesion.        Left: No rash or lesion.      Comments: White clumpy discharge noted around external vaginal opening and  superiorly. No erythema, lesions, or rash noted. Skin:    General: Skin is warm and dry.  Neurological:     General: No focal deficit present.     Mental Status: She is alert.  Psychiatric:        Mood and Affect: Mood and affect normal.     Results:  Labs: Results for orders placed or performed during the hospital encounter of 12/27/22 (from the past 24 hour(s))  POCT urinalysis dipstick     Status: Abnormal   Collection Time: 12/27/22  8:50 AM  Result Value Ref Range   Color, UA yellow yellow   Clarity, UA clear clear   Glucose, UA negative negative mg/dL   Bilirubin, UA negative negative   Ketones, POC UA negative negative mg/dL   Spec Grav, UA >=9.147 (A) 1.010 - 1.025   Blood, UA negative negative   pH, UA 5.5 5.0 - 8.0   Protein Ur, POC =100 (A) negative mg/dL   Urobilinogen, UA 0.2 0.2 or 1.0 E.U./dL   Nitrite, UA Negative Negative   Leukocytes, UA Trace (A) Negative    Radiology: No results found.   UC Course/Treatments:  Procedures: Procedures   Medications Ordered in UC: Medications - No data to display   Assessment and Plan :     ICD-10-CM   1. Vaginal dryness  N89.8 Urine Culture    Urine Culture    2. Candidiasis of vagina  B37.31      Vaginal dryness Afebrile, nontoxic-appearing, NAD. VSS. DDX includes but not limited to: secondary to menopause, vaginal atrophy, candidiasis, BV UA was unremarkable except for trace leukocytes. Urine culture and cytology are pending. Yeast noted on  external vaginal exam. Diflucan 150mg  once and nystatin ointment BID were prescribed given exam findings. Recommend OTC vaginal moisturizers.  If no improvement recommend follow-up with GYN and/or PCP. Strict ED precautions were given and patient verbalized understanding.  Candidiasis of Vagina Afebrile, nontoxic-appearing, NAD. VSS. DDX includes but not limited to: secondary to menopause, vaginal atrophy, candidiasis, BV Cytology is pending. Yeast noted on external vaginal exam. Diflucan 150mg  once and nystatin ointment BID were prescribed given exam findings. If no improvement recommend follow-up with GYN and/or PCP. Strict ED precautions were given and patient verbalized understanding.  ED Discharge Orders          Ordered    fluconazole (DIFLUCAN) 150 MG tablet  Daily        12/27/22 0900    nystatin ointment (MYCOSTATIN)  2 times daily PRN        12/27/22 0900             PDMP not reviewed this encounter.      Cynda Acres, PA-C 12/27/22 [id number]

## 2022-12-27 NOTE — Discharge Instructions (Addendum)
Your swab and urine were sent to the lab for further testing. You will be called with results. I have sent you two prescriptions. One is a pill for a yeast infection and the other is a topical ointment to use on the outside of your vaginae. You should avoid all sexual activity until you have been notified of all your results and have undergone any necessary treatment.  If you are positive, it is recommended that you inform all sexual partners so they can treat be treated as well before having sex again.   I suspected your symptoms are related to menopause as well. I recommend you try OTC lubricants and vaginal moisturizers such as vagisil rephresh and follow up with your GYN.

## 2022-12-28 ENCOUNTER — Telehealth (HOSPITAL_COMMUNITY): Payer: Self-pay | Admitting: Emergency Medicine

## 2022-12-28 LAB — CERVICOVAGINAL ANCILLARY ONLY
Bacterial Vaginitis (gardnerella): POSITIVE — AB
Candida Glabrata: NEGATIVE
Candida Vaginitis: NEGATIVE
Chlamydia: NEGATIVE
Comment: NEGATIVE
Comment: NEGATIVE
Comment: NEGATIVE
Comment: NEGATIVE
Comment: NEGATIVE
Comment: NORMAL
Neisseria Gonorrhea: NEGATIVE
Trichomonas: NEGATIVE

## 2022-12-28 LAB — URINE CULTURE: Culture: NO GROWTH

## 2022-12-28 MED ORDER — METRONIDAZOLE 500 MG PO TABS: 500.0000 mg | ORAL_TABLET | Freq: Two times a day (BID) | ORAL | 0 refills | Status: DC

## 2023-01-13 ENCOUNTER — Encounter: Payer: Self-pay | Admitting: Family Medicine

## 2023-01-13 DIAGNOSIS — E119 Type 2 diabetes mellitus without complications: Secondary | ICD-10-CM

## 2023-01-13 DIAGNOSIS — I10 Essential (primary) hypertension: Secondary | ICD-10-CM

## 2023-01-13 DIAGNOSIS — K581 Irritable bowel syndrome with constipation: Secondary | ICD-10-CM

## 2023-01-13 MED ORDER — METFORMIN HCL ER 500 MG PO TB24
ORAL_TABLET | ORAL | 5 refills | Status: DC
Start: 2023-01-13 — End: 2023-11-28

## 2023-01-13 MED ORDER — CARVEDILOL 25 MG PO TABS: 25.0000 mg | ORAL_TABLET | Freq: Two times a day (BID) | ORAL | 3 refills | Status: DC

## 2023-01-13 MED ORDER — LOSARTAN POTASSIUM 100 MG PO TABS: 100.0000 mg | ORAL_TABLET | Freq: Every day | ORAL | 3 refills | Status: DC

## 2023-01-13 MED ORDER — AMLODIPINE BESYLATE 10 MG PO TABS: 10.0000 mg | ORAL_TABLET | Freq: Every day | ORAL | 3 refills | Status: DC

## 2023-01-13 MED ORDER — HYDROCHLOROTHIAZIDE 12.5 MG PO TABS: 12.5000 mg | ORAL_TABLET | Freq: Every day | ORAL | 3 refills | Status: DC

## 2023-01-13 NOTE — Progress Notes (Deleted)
    SUBJECTIVE:   CHIEF COMPLAINT: follow up HPI:   Carmen [name] is a 62 y.o.  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   Diabetes:  ***  Other concerns:  ***   PERTINENT  PMH / PSH/Family/Social History : ***  OBJECTIVE:   LMP 07/15/2015 (Exact Date)   Today's weight:  Review of prior weights: There were no vitals filed for this visit.  ***  ASSESSMENT/PLAN:   No problem-specific Assessment & Plan notes found for this encounter.     {    This will disappear when note is signed, click to select method of visit    :1}  Terisa Starr, MD  Family Medicine Teaching Service  [facility]

## 2023-01-14 ENCOUNTER — Ambulatory Visit: Payer: Medicaid Other | Admitting: Family Medicine

## 2023-01-21 NOTE — Progress Notes (Unsigned)
    SUBJECTIVE:   CHIEF COMPLAINT: A1c and meds HPI:   Carmen [name] is a 62 y.o.  with history notable for type 2 DM, obesity, and HTN presenting for follow up.  Vaginal discharge This is returned and worsened. Was seen in Urgent care. Treated with nystatin and fluconazole. Testing showed BV. She never took metronidazole. Declines HIV, other STI testing. Reports some discharge and vaginal 'tightness. No vaginal bleeding.  Diabetes Taking medications as prescribed. She has severe myalgias with statins, has tried multiple. Reports some mild nausea once in a while with Ozempic.  HCM Not intersted in CSY or mammogram   Fatigue She reports months of fatigue. No CP, dyspnea. Does have occasional hot flashes still. No weight loss. Reports she snores all the time. Primary complaint is she feels sleepy most of day. No other symptoms of anemia. No cold intolerance. Does notice her hands shaking once in a while.  PERTINENT  PMH / PSH/Family/Social History : updated and reviewed   OBJECTIVE:   BP (!) 118/52   Pulse 69   Ht 5\' 3"  (1.6 m)   Wt 188 lb 6.4 oz (85.5 kg)   LMP 07/15/2015 (Exact Date)   SpO2 100%   BMI 33.37 kg/m   Today's weight:  Last Weight  Most recent update: 01/25/2023 10:44 AM    Weight  85.5 kg (188 lb 6.4 oz)            Review of prior weights: [employer]   01/25/23 1043  Weight: 188 lb 6.4 oz (85.5 kg)   RRR Lungs clear GU Exam:    External exam: Erythema in gluteal fold.  Vaginal exam notable for atrophy of labia minor, minimal discharge.  Cervix without discharge or obvious lesion.  Chaperoned examine, CMA Dayshia .    ASSESSMENT/PLAN:   Type 2 diabetes mellitus with neurological complications (HCC) Continue Ozempic at current dose BMP today  A1C at goal   Dyslipidemia LDL today, will retrial zetia vs. Thrice weekly statin   Fatigue Unclear cause, seems consistent with excessive daytime sleepiness Check CBC for anemia, TSH for  thyroid disease Monitor for now Discussed sleep study--declined  Vaginal Irritation Suspect due to atrophy If persistent, would benefit from Gynecology evaluation GC/CT collected today   HCM UTD on pap      Terisa Starr, MD  Family Medicine Teaching Service  [facility]

## 2023-01-25 ENCOUNTER — Other Ambulatory Visit: Payer: Self-pay

## 2023-01-25 ENCOUNTER — Encounter: Payer: Self-pay | Admitting: Family Medicine

## 2023-01-25 ENCOUNTER — Ambulatory Visit: Payer: Medicaid Other | Admitting: Family Medicine

## 2023-01-25 ENCOUNTER — Other Ambulatory Visit (HOSPITAL_COMMUNITY)
Admission: RE | Admit: 2023-01-25 | Discharge: 2023-01-25 | Disposition: A | Discharge status: Home / Self Care | Payer: Medicaid Other | Source: Ambulatory Visit | Attending: Family Medicine | Admitting: Family Medicine

## 2023-01-25 VITALS — BP 118/52 | HR 69 | Ht 63.0 in | Wt 188.4 lb

## 2023-01-25 DIAGNOSIS — N898 Other specified noninflammatory disorders of vagina: Secondary | ICD-10-CM

## 2023-01-25 DIAGNOSIS — E1149 Type 2 diabetes mellitus with other diabetic neurological complication: Secondary | ICD-10-CM

## 2023-01-25 DIAGNOSIS — E11319 Type 2 diabetes mellitus with unspecified diabetic retinopathy without macular edema: Secondary | ICD-10-CM | POA: Diagnosis present

## 2023-01-25 DIAGNOSIS — R5383 Other fatigue: Secondary | ICD-10-CM | POA: Diagnosis not present

## 2023-01-25 LAB — POCT GLYCOSYLATED HEMOGLOBIN (HGB A1C): HbA1c, POC (prediabetic range): 5.6 % — AB (ref 5.7–6.4)

## 2023-01-25 NOTE — Patient Instructions (Addendum)
It was wonderful to see you today.  Please bring ALL of your medications with you to every visit.   Today we talked about: -Checking for infection I will message you with results  - Given your nausea, I recommend staying on 1 mg of the Ozempic  - We will check blood work for your sweats  I recommended - A mammogram - Colonoscopy Let me know if you want them  I also recommend a sleep study   Please follow up in 3 months   CONGRATULATIONS YOUR A1C is 5.6   Thank you for choosing [facility].   Please call [phone] with any questions about today's appointment.  Please be sure to schedule follow up at the front  desk before you leave today.   Terisa Starr, MD  Family Medicine

## 2023-01-25 NOTE — Assessment & Plan Note (Signed)
Continue Ozempic at current dose BMP today  A1C at goal

## 2023-01-26 LAB — CBC
Hematocrit: 34.1 % (ref 34.0–46.6)
Hemoglobin: 11.6 g/dL (ref 11.1–15.9)
MCH: 30 pg (ref 26.6–33.0)
MCHC: 34 g/dL (ref 31.5–35.7)
MCV: 88 fL (ref 79–97)
Platelets: 258 10*3/uL (ref 150–450)
RBC: 3.87 x10E6/uL (ref 3.77–5.28)
RDW: 12.7 % (ref 11.7–15.4)
WBC: 8.2 10*3/uL (ref 3.4–10.8)

## 2023-01-26 LAB — CERVICOVAGINAL ANCILLARY ONLY
Bacterial Vaginitis (gardnerella): NEGATIVE
Candida Glabrata: NEGATIVE
Candida Vaginitis: NEGATIVE
Chlamydia: NEGATIVE
Comment: NEGATIVE
Comment: NEGATIVE
Comment: NEGATIVE
Comment: NEGATIVE
Comment: NEGATIVE
Comment: NORMAL
Neisseria Gonorrhea: NEGATIVE
Trichomonas: NEGATIVE

## 2023-01-26 LAB — COMPREHENSIVE METABOLIC PANEL
ALT: 12 IU/L (ref 0–32)
AST: 11 IU/L (ref 0–40)
Albumin: 4.3 g/dL (ref 3.8–4.9)
Alkaline Phosphatase: 81 IU/L (ref 44–121)
BUN/Creatinine Ratio: 25 (ref 12–28)
BUN: 29 mg/dL — ABNORMAL HIGH (ref 8–27)
Bilirubin Total: 0.3 mg/dL (ref 0.0–1.2)
CO2: 25 mmol/L (ref 20–29)
Calcium: 8.7 mg/dL (ref 8.7–10.3)
Chloride: 99 mmol/L (ref 96–106)
Creatinine, Ser: 1.16 mg/dL — ABNORMAL HIGH (ref 0.57–1.00)
Globulin, Total: 2.3 g/dL (ref 1.5–4.5)
Glucose: 132 mg/dL — ABNORMAL HIGH (ref 70–99)
Potassium: 4 mmol/L (ref 3.5–5.2)
Sodium: 140 mmol/L (ref 134–144)
Total Protein: 6.6 g/dL (ref 6.0–8.5)
eGFR: 54 mL/min/{1.73_m2} — ABNORMAL LOW (ref >59)

## 2023-01-26 LAB — T4F: T4,Free (Direct): 1.09 ng/dL (ref 0.82–1.77)

## 2023-01-26 LAB — LDL CHOLESTEROL, DIRECT: LDL Direct: 113 mg/dL — ABNORMAL HIGH (ref 0–99)

## 2023-01-26 LAB — TSH RFX ON ABNORMAL TO FREE T4: TSH: 5.09 u[IU]/mL — ABNORMAL HIGH (ref 0.450–4.500)

## 2023-02-25 ENCOUNTER — Telehealth: Payer: Self-pay | Admitting: Pharmacist

## 2023-02-25 DIAGNOSIS — K581 Irritable bowel syndrome with constipation: Secondary | ICD-10-CM

## 2023-02-25 MED ORDER — ESCITALOPRAM OXALATE 10 MG PO TABS
20.0000 mg | ORAL_TABLET | Freq: Every day | ORAL | 2 refills | Status: DC
Start: 2023-02-25 — End: 2023-06-09

## 2023-02-25 NOTE — Telephone Encounter (Signed)
Patient called requesting refill today as she has not heard from Dr. Manson Passey and wanted to get her refill today.   I contacted her to clarify dose of 2 x 10mg  escitalopram (lexapro).  Patient doing well on current regimen, denying any adverse effects.   Refill provided.   Patient was appreciative of the follow-up.

## 2023-02-28 NOTE — Telephone Encounter (Signed)
Reviewed and agree.

## 2023-03-11 ENCOUNTER — Other Ambulatory Visit: Payer: Self-pay | Admitting: Family Medicine

## 2023-03-11 DIAGNOSIS — Z1212 Encounter for screening for malignant neoplasm of rectum: Secondary | ICD-10-CM

## 2023-03-11 DIAGNOSIS — Z1211 Encounter for screening for malignant neoplasm of colon: Secondary | ICD-10-CM

## 2023-03-18 ENCOUNTER — Other Ambulatory Visit: Payer: Self-pay | Admitting: Family Medicine

## 2023-03-18 DIAGNOSIS — I10 Essential (primary) hypertension: Secondary | ICD-10-CM

## 2023-03-18 MED ORDER — AMLODIPINE BESYLATE 10 MG PO TABS
10.0000 mg | ORAL_TABLET | Freq: Every day | ORAL | 3 refills | Status: AC
Start: 2023-03-18 — End: ?

## 2023-04-11 ENCOUNTER — Other Ambulatory Visit: Payer: Self-pay

## 2023-04-11 DIAGNOSIS — I10 Essential (primary) hypertension: Secondary | ICD-10-CM

## 2023-04-11 DIAGNOSIS — E119 Type 2 diabetes mellitus without complications: Secondary | ICD-10-CM

## 2023-04-11 MED ORDER — LOSARTAN POTASSIUM 100 MG PO TABS
100.0000 mg | ORAL_TABLET | Freq: Every day | ORAL | 3 refills | Status: AC
Start: 2023-04-11 — End: ?

## 2023-04-11 MED ORDER — GABAPENTIN 300 MG PO CAPS: 600.0000 mg | ORAL_CAPSULE | Freq: Every day | ORAL | 3 refills | Status: DC

## 2023-04-28 ENCOUNTER — Encounter: Payer: Self-pay | Admitting: Family Medicine

## 2023-04-28 ENCOUNTER — Ambulatory Visit: Payer: Medicaid Other | Admitting: Family Medicine

## 2023-04-28 VITALS — BP 145/75 | HR 70 | Ht 62.0 in | Wt 200.2 lb

## 2023-04-28 DIAGNOSIS — M65311 Trigger thumb, right thumb: Secondary | ICD-10-CM | POA: Diagnosis present

## 2023-04-28 DIAGNOSIS — M792 Neuralgia and neuritis, unspecified: Secondary | ICD-10-CM

## 2023-04-28 DIAGNOSIS — G629 Polyneuropathy, unspecified: Secondary | ICD-10-CM

## 2023-04-28 DIAGNOSIS — M65319 Trigger thumb, unspecified thumb: Secondary | ICD-10-CM | POA: Insufficient documentation

## 2023-04-28 MED ORDER — GABAPENTIN 300 MG PO CAPS
300.0000 mg | ORAL_CAPSULE | Freq: Three times a day (TID) | ORAL | 3 refills | Status: DC
Start: 2023-04-28 — End: 2023-05-04

## 2023-04-28 NOTE — Assessment & Plan Note (Addendum)
Increase gabapentin to a total of 900 mg/day, 300 mg should be taken in the morning, 600 mg should be taken at bedtime.  Advised patient that she could take all 900 in the evening if she found the daytime dose made her too sleepy.  Patient's creatinine clearance is greater than 60 by MD calculation.  I also advised the patient that I would read about each ATTR neuropathy and forward this encounter to Dr. Manson Passey so that she would know the patient's concerns as I had never heard of this type of neuropathy before.  Patient will make follow-up appointment with Dr. Manson Passey specifically as soon as possible.  Given patient's concha mitten major depressive disorder, could consider switching patient from Lexapro to venlafaxine for its dual action in dealing with neuropathy and depressive symptoms.

## 2023-04-28 NOTE — Progress Notes (Signed)
    SUBJECTIVE:   CHIEF COMPLAINT / HPI:   Carmen [name] is a 62 year old woman with known history of left-sided neuropathy presenting today with worsening pins-and-needles feeling in the same side primarily in her upper arm.  Reports that it has been getting worse over the last couple of weeks and is constant.  She states that the gabapentin does help but is currently not completely relieving her symptoms.  She inquired as to whether her neuropathy could be caused by  hATTR amyloidosis, as she had recently heard about this on the radio and thought that it sounded like her symptoms.  Patient also is having a catching and snapping sensation in her right thumb for the last few weeks almost every time she bends her thumb into flexion.  PERTINENT  PMH / PSH: neuropathy, HTN, IBS, T2DM, carpal tunnel syndrome, osteoarthritis.  OBJECTIVE:   BP (!) 145/75   Pulse 70   Ht 5\' 2"  (1.575 m)   Wt 200 lb 3.2 oz (90.8 kg)   LMP 07/15/2015 (Exact Date)   SpO2 90%   BMI 36.62 kg/m   General: Alert, oriented, NAD MSK: Extraocular eye movements intact.  No obvious facial droop or asymmetry.  Right thumb tendon is snapping with flexion and extension.  Thumb appears to get stuck in flexion for a brief period of time.  No loss of strength in either upper extremity.  Patient reports pins-and-needles sensation over the entire surface of her left arm and into the left side of her neck.  ASSESSMENT/PLAN:   Neuropathic pain Increase gabapentin to a total of 900 mg/day, 300 mg should be taken in the morning, 600 mg should be taken at bedtime.  Advised patient that she could take all 900 in the evening if she found the daytime dose made her too sleepy.  Patient's creatinine clearance is greater than 60 by MD calculation.  I also advised the patient that I would read about each ATTR neuropathy and forward this encounter to Dr. Manson Passey so that she would know the patient's concerns as I had never heard of this type of  neuropathy before.  Patient will make follow-up appointment with Dr. Manson Passey specifically as soon as possible.  Given patient's concha mitten major depressive disorder, could consider switching patient from Lexapro to venlafaxine for its dual action in dealing with neuropathy and depressive symptoms.  Trigger thumb Referral to hand surgery for further evaluation and treatment.   Gerrit Heck, DO [facility]

## 2023-04-28 NOTE — Patient Instructions (Addendum)
It was wonderful to see you today!  Today we discussed your left sided tingling and pins and needle sensation, as well as the catching you have been experiencing in your right thumb.  For your neuropathy I have increased your gabapentin to 900 mg daily.  You should take 1 pill in the morning, and continue taking 2 pills at bedtime.  If you have fatigue during the day with taking the pill you can switch to taking all 3 at bedtime.  For the catching in your thumb I have referred you to a hand surgeon.  They will further evaluate what is going on with the tendon and help you come up with an appropriate plan to treat this problem.  Please call [phone] with any questions about today's appointment.   If you need any additional refills, please call your pharmacy before calling the office.  Gerrit Heck, DO Family Medicine

## 2023-04-28 NOTE — Assessment & Plan Note (Signed)
Referral to hand surgery for further evaluation and treatment.

## 2023-05-02 ENCOUNTER — Encounter: Payer: Self-pay | Admitting: Family Medicine

## 2023-05-04 ENCOUNTER — Other Ambulatory Visit: Payer: Self-pay | Admitting: Family Medicine

## 2023-05-04 DIAGNOSIS — G629 Polyneuropathy, unspecified: Secondary | ICD-10-CM

## 2023-05-04 MED ORDER — GABAPENTIN 300 MG PO CAPS
300.0000 mg | ORAL_CAPSULE | Freq: Two times a day (BID) | ORAL | 3 refills | Status: DC
Start: 2023-05-04 — End: 2024-01-09

## 2023-05-05 IMAGING — CT CT ABD-PELV W/O CM
2 of 4 series · 16 of 46 positions shown, 18 images · non-contrast
Comparison: None.

CLINICAL DATA: Reassessment for history of ureteral calculus.

EXAM:
CT ABDOMEN AND PELVIS WITHOUT CONTRAST
TECHNIQUE: Multidetector CT imaging of the abdomen and pelvis was performed
following the standard protocol without IV contrast.

[Series 2: renal stone 5.00 br40 s3 axial · axial · 0.90mm/px · z∈[+1259,+1624]mm · 13 of 81 slices shown, 15 images]
[im 4/81  soft-tissue]
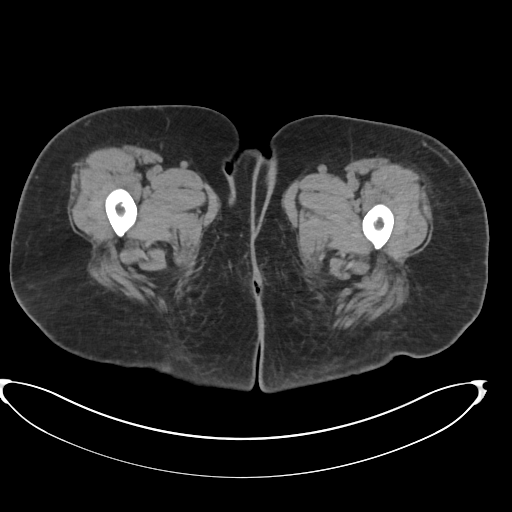
[im 4/81  bone]
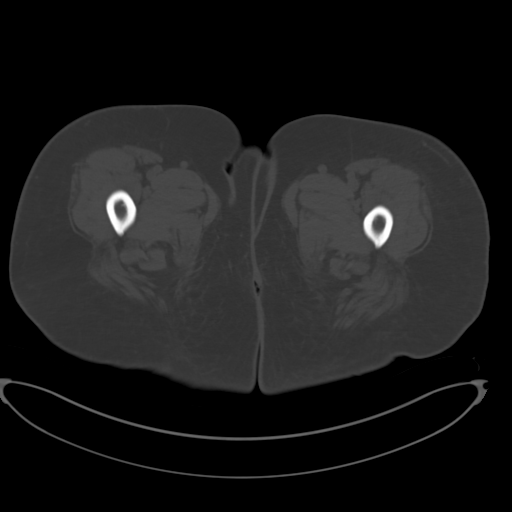
[im 11/81  soft-tissue]
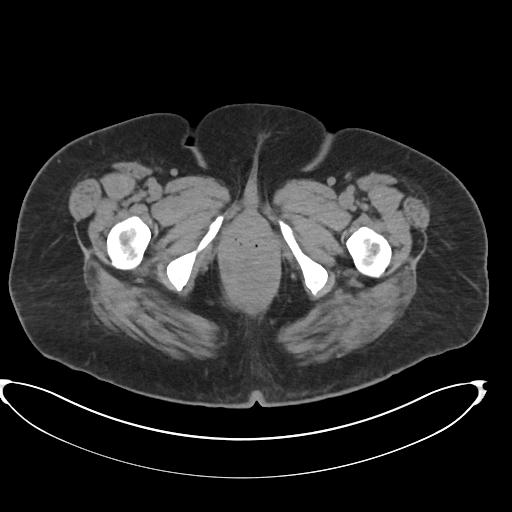
[im 18/81  soft-tissue]
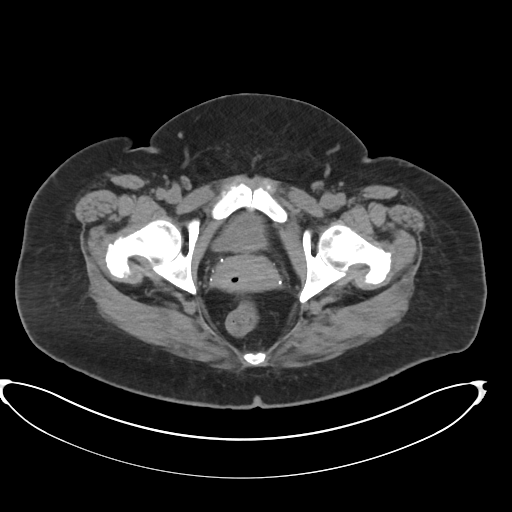
[im 21/81  soft-tissue]
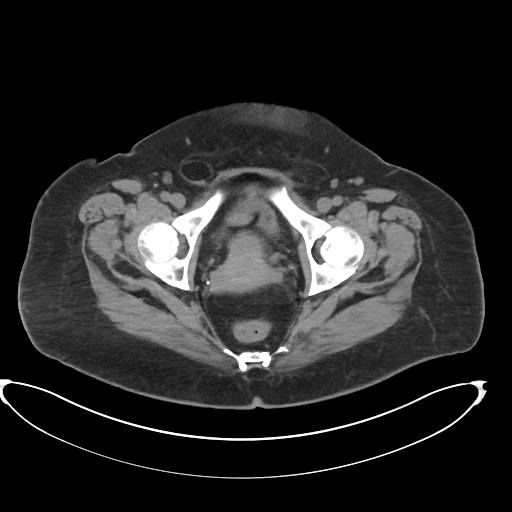
[im 28/81  soft-tissue]
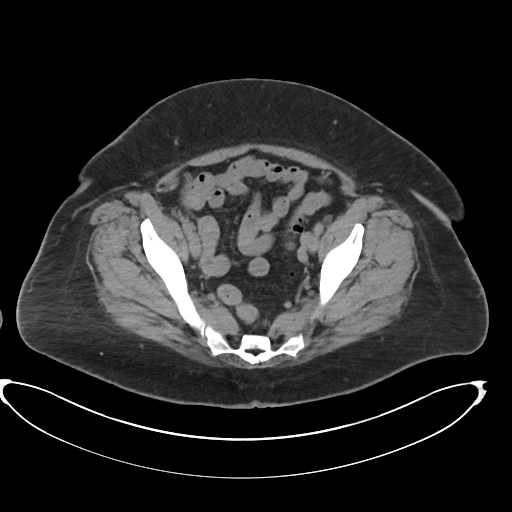
[im 35/81  soft-tissue]
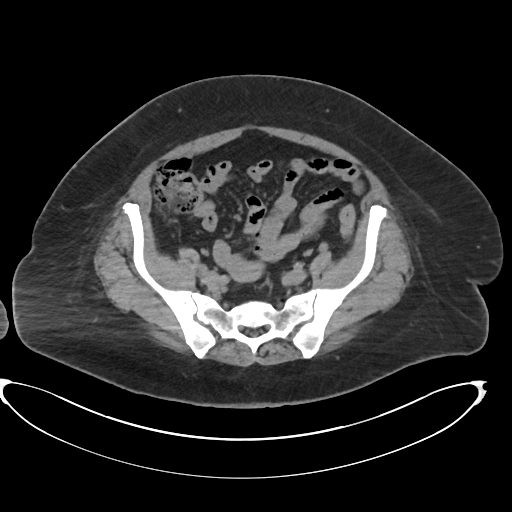
[im 42/81  soft-tissue]
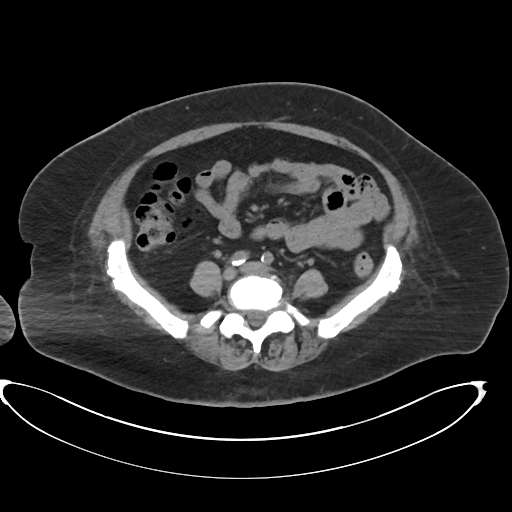
[im 46/81  soft-tissue]
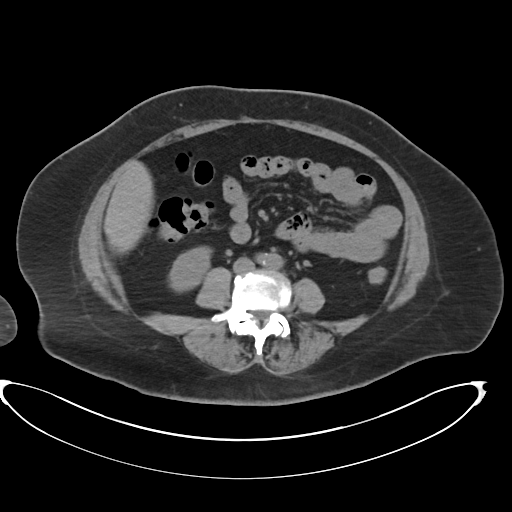
[im 53/81  soft-tissue]
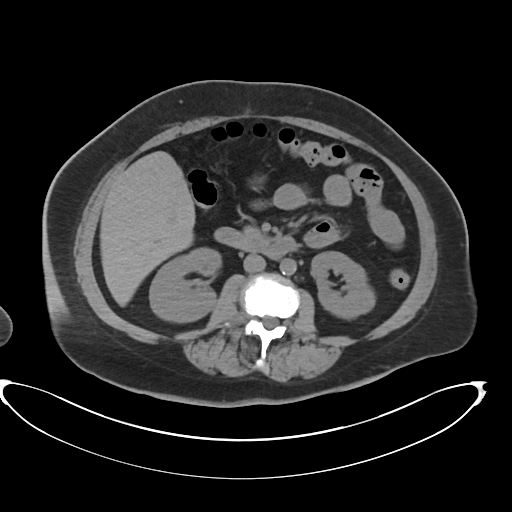
[im 53/81  bone]
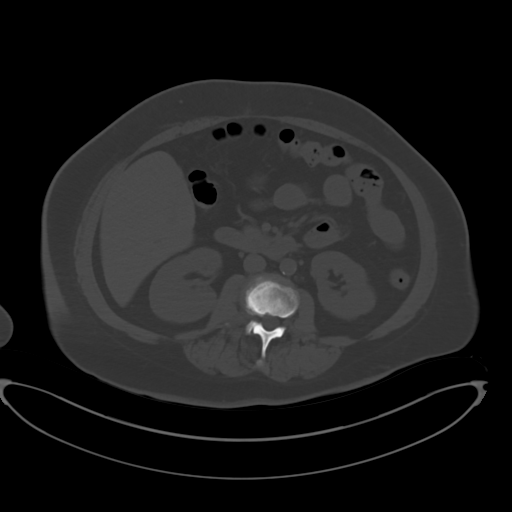
[im 60/81  soft-tissue]
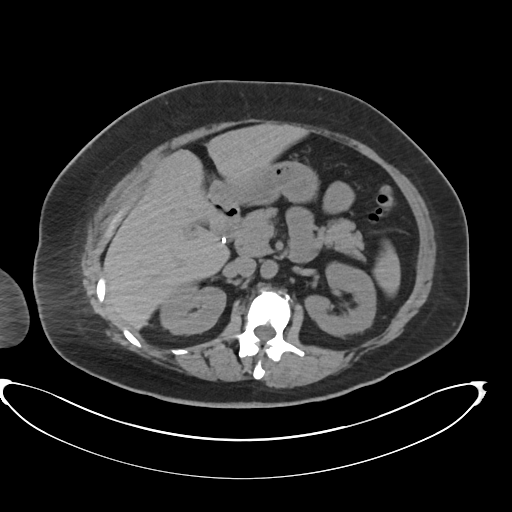
[im 63/81  soft-tissue]
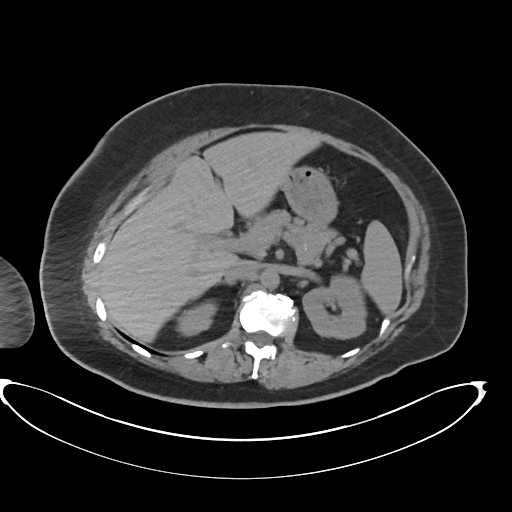
[im 70/81  soft-tissue]
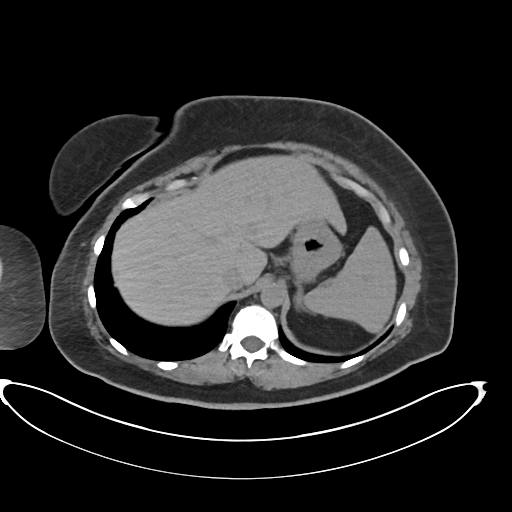
[im 77/81  soft-tissue]
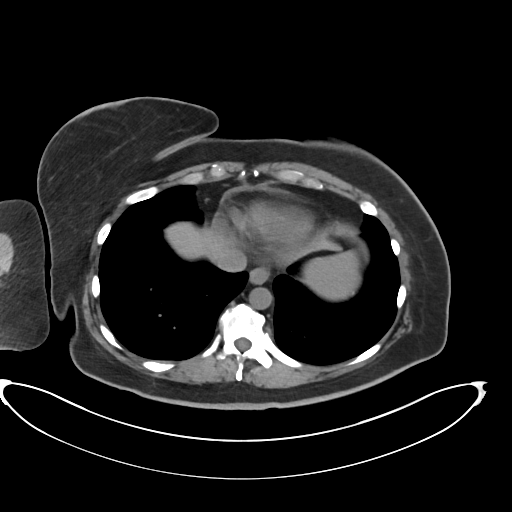

[Series 6: renal stone 2.00 br40 s3 cor · coronal · 0.79mm/px · 3 of 228 slices shown]
[im 76/228  soft-tissue]
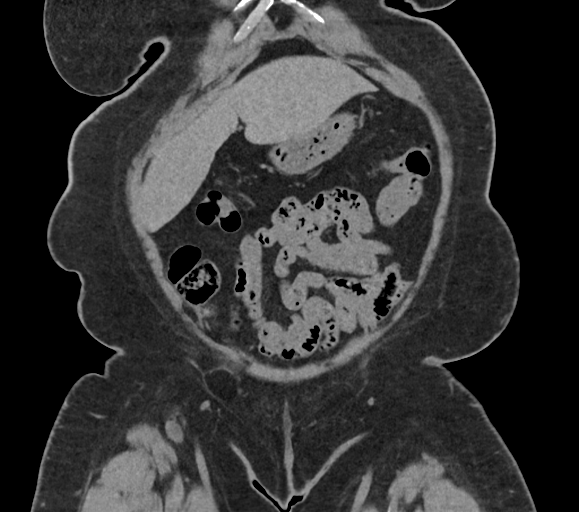
[im 101/228  soft-tissue]
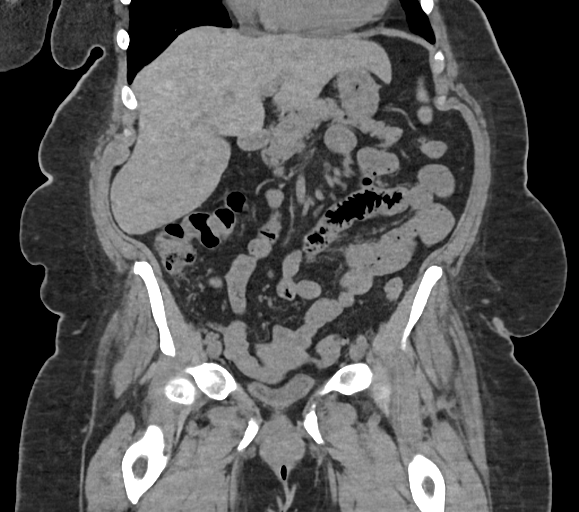
[im 127/228  soft-tissue]
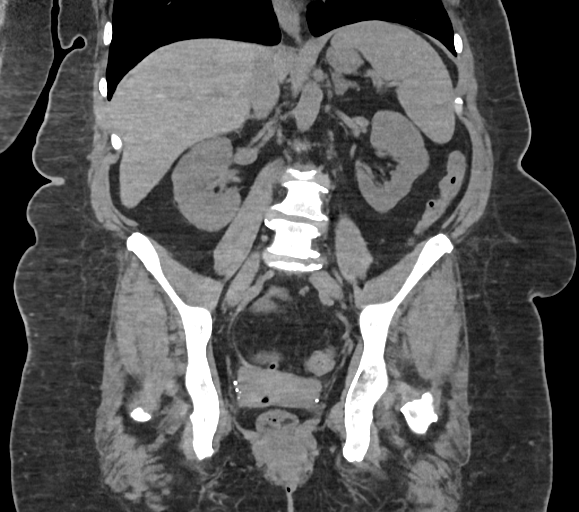

[16 of 46 positions shown; findings below may reference images not displayed]

FINDINGS: Lower chest: No acute abnormality.

Hepatobiliary: Unremarkable noncontrast appearance of the hepatic
parenchyma. Gallbladder surgically absent. No biliary ductal
dilation.

Pancreas: No pancreatic ductal dilation or evidence of acute
inflammation.

Spleen: Within normal limits.

Adrenals/Urinary Tract: Bilateral adrenal glands are unremarkable.
No hydronephrosis. No nephrolithiasis. No obstructive ureteral or
bladder calculi visualized. Numerous bilateral pelvic phleboliths.
Urinary bladder is decompressed.

Stomach/Bowel: No enteric contrast was administered. Stomach is
unremarkable for degree of distension. No pathologic dilation of
small or large bowel. Colonic diverticulosis without findings of
acute diverticulitis.

Vascular/Lymphatic: Aortic and branch vessel atherosclerosis. No
pathologically enlarged abdominal or pelvic lymph nodes.

Reproductive: Uterus and bilateral adnexa are unremarkable.

Other: No abdominopelvic ascites.

Musculoskeletal: Multilevel degenerative changes spine. Levoconvex
curvature of the thoracolumbar spine. No acute osseous abnormality.
IMPRESSION: 1. No evidence of obstructive ureteral or bladder calculi.
2. Colonic diverticulosis without findings of acute diverticulitis.
3. Aortic Atherosclerosis ([id number]).

## 2023-05-20 NOTE — Progress Notes (Unsigned)
    SUBJECTIVE:   CHIEF COMPLAINT: vaginal symptoms  HPI:   Carmen [name] is a 62 y.o.  with history notable for HTN, obesity, type 2 DM presenting for vaginal irrigation.   The patient has a tremor when holding coffee. She is worried about parkinsons. Also notes her 'whole body' is restless sometimes at night. No falls. No rest tremor. No change in voice. No menstrual blood loss.   Vaginal irritation Having soreness and worried about 'cut'. No discharge. No urinary changes. Sexually active with one partner only intermittently. No bleeding.  She has not noticed disorder.  She is using a new soap that was given to her by her friend  HTN/Diabetes Diabetes remains well controlled. Taking medications as prescribed.  The patient is certain she does not want to take a statin.  We discussed that risks of heart attack and stroke.  She has not yet followed up with her eye doctor and has some mild retinopathy on exam last year.  We discussed the need for follow-up and she will call them.  HCM Discussed and recommended early colon cancer and breast cancer screening.  She declined these.  We discussed the risks.  The patient reports her mood is okay.  Her and her longtime partner Carmen [name] are having some intermittent issues.   PERTINENT  PMH / PSH/Family/Social History :    OBJECTIVE:   BP 131/65   Pulse 74   Ht 5\' 2"  (1.575 m)   Wt 199 lb 3.2 oz (90.4 kg)   LMP 07/15/2015 (Exact Date)   SpO2 99%   BMI 36.43 kg/m   Today's weight:  Last Weight  Most recent update: 05/23/2023  9:55 AM    Weight  90.4 kg (199 lb 3.2 oz)            Review of prior weights: Filed Weights   05/23/23 0954  Weight: 199 lb 3.2 oz (90.4 kg)    RRR Lungs clear GU Exam:    External exam: Normal-appearing female external genitalia.  Vaginal exam notable for atrophy, no lesions or ulcers or changes.  Cervix without discharge or obvious lesion.   Chaperoned examine, CMA Dayshia.   Normal gait Swift  turn Has slight dysmetria on L, action tremor No rest tremor ASSESSMENT/PLAN:   Assessment & Plan Type 2 diabetes mellitus with neurological complications (HCC) A1C at goal  Refilled Ozempic She has statin related myopathy --discussed this would reduce heart attack and stroke  CMP today and UACR  Referral back to Eye Doctor--discussed importance Moderate episode of recurrent major depressive disorder (HCC) Doing well Discussed and offered couples counseling Primary hypertension At goal  Continue current medications  Vaginal irritation Suspect due to atrophy No signs of lichen sclerosis No HSV Collected GC/CT and Candida  Restless leg  hypnagogic jerk? 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 Gait is fast, good arm swing, no rest tremor. Reassurance. Reduce caffeine. Already on beta blocker  Abnormal TSH--suspect subclinical hypothyroidism or non thyroidal illness--repeat today   At next visit discuss Shingles and Tdap  Terisa Starr, MD  Family Medicine Teaching Service  [facility]

## 2023-05-23 ENCOUNTER — Other Ambulatory Visit (HOSPITAL_COMMUNITY)
Admission: RE | Admit: 2023-05-23 | Discharge: 2023-05-23 | Disposition: A | Discharge status: Home / Self Care | Payer: Medicaid Other | Source: Ambulatory Visit | Attending: Family Medicine | Admitting: Family Medicine

## 2023-05-23 ENCOUNTER — Ambulatory Visit: Payer: Medicaid Other | Admitting: Family Medicine

## 2023-05-23 ENCOUNTER — Encounter: Payer: Self-pay | Admitting: Family Medicine

## 2023-05-23 VITALS — BP 131/65 | HR 74 | Ht 62.0 in | Wt 199.2 lb

## 2023-05-23 DIAGNOSIS — G2581 Restless legs syndrome: Secondary | ICD-10-CM

## 2023-05-23 DIAGNOSIS — E1149 Type 2 diabetes mellitus with other diabetic neurological complication: Secondary | ICD-10-CM | POA: Diagnosis present

## 2023-05-23 DIAGNOSIS — N898 Other specified noninflammatory disorders of vagina: Secondary | ICD-10-CM | POA: Diagnosis not present

## 2023-05-23 DIAGNOSIS — F331 Major depressive disorder, recurrent, moderate: Secondary | ICD-10-CM

## 2023-05-23 DIAGNOSIS — I10 Essential (primary) hypertension: Secondary | ICD-10-CM | POA: Diagnosis not present

## 2023-05-23 LAB — POCT GLYCOSYLATED HEMOGLOBIN (HGB A1C): HbA1c, POC (controlled diabetic range): 6.3 % (ref 0.0–7.0)

## 2023-05-23 MED ORDER — OZEMPIC (1 MG/DOSE) 4 MG/3ML ~~LOC~~ SOPN: 1.0000 mg | PEN_INJECTOR | SUBCUTANEOUS | 3 refills | Status: DC

## 2023-05-23 NOTE — Assessment & Plan Note (Signed)
A1C at goal  Refilled Ozempic She has statin related myopathy --discussed this would reduce heart attack and stroke  CMP today and UACR  Referral back to Eye Doctor--discussed importance

## 2023-05-23 NOTE — Assessment & Plan Note (Signed)
Doing well Discussed and offered couples counseling

## 2023-05-23 NOTE — Patient Instructions (Addendum)
It was wonderful to see you today.  Please bring ALL of your medications with you to every visit.   Today we talked about:  I do NOT think you have parkinsons  I recommend an unscented, soap 'sensitive skin'  You can try replens lubricant  I will call you with results      We discussed - A mammogram - Testing for early signs of colon cancer - Flu vaccine  Let me know if you want any of these  Your medicaid will cover Ozempic    Please follow up in 3 months   Thank you for choosing [facility].   Please call [phone] with any questions about today's appointment.  Please be sure to schedule follow up at the front  desk before you leave today.   Terisa Starr, MD  Family Medicine

## 2023-05-23 NOTE — Assessment & Plan Note (Signed)
At goal. Continue current medications. 

## 2023-05-24 ENCOUNTER — Telehealth: Payer: Self-pay | Admitting: Family Medicine

## 2023-05-24 LAB — CERVICOVAGINAL ANCILLARY ONLY
Bacterial Vaginitis (gardnerella): NEGATIVE
Chlamydia: NEGATIVE
Comment: NEGATIVE
Comment: NEGATIVE
Comment: NEGATIVE
Comment: NORMAL
Neisseria Gonorrhea: NEGATIVE
Trichomonas: NEGATIVE

## 2023-05-24 LAB — LDL CHOLESTEROL, DIRECT: LDL Direct: 141 mg/dL — ABNORMAL HIGH (ref 0–99)

## 2023-05-24 LAB — COMPREHENSIVE METABOLIC PANEL
ALT: 11 [IU]/L (ref 0–32)
AST: 13 [IU]/L (ref 0–40)
Albumin: 4.3 g/dL (ref 3.9–4.9)
Alkaline Phosphatase: 91 [IU]/L (ref 44–121)
BUN/Creatinine Ratio: 23 (ref 12–28)
BUN: 25 mg/dL (ref 8–27)
Bilirubin Total: 0.3 mg/dL (ref 0.0–1.2)
CO2: 25 mmol/L (ref 20–29)
Calcium: 8.5 mg/dL — ABNORMAL LOW (ref 8.7–10.3)
Chloride: 101 mmol/L (ref 96–106)
Creatinine, Ser: 1.08 mg/dL — ABNORMAL HIGH (ref 0.57–1.00)
Globulin, Total: 2.4 g/dL (ref 1.5–4.5)
Glucose: 131 mg/dL — ABNORMAL HIGH (ref 70–99)
Potassium: 4.5 mmol/L (ref 3.5–5.2)
Sodium: 141 mmol/L (ref 134–144)
Total Protein: 6.7 g/dL (ref 6.0–8.5)
eGFR: 58 mL/min/{1.73_m2} — ABNORMAL LOW (ref >59)

## 2023-05-24 LAB — MICROALBUMIN / CREATININE URINE RATIO
Creatinine, Urine: 89.1 mg/dL
Microalb/Creat Ratio: 33 mg/g{creat} — ABNORMAL HIGH (ref 0–29)
Microalbumin, Urine: 29.7 ug/mL

## 2023-05-24 LAB — FERRITIN: Ferritin: 221 ng/mL — ABNORMAL HIGH (ref 15–150)

## 2023-05-24 LAB — TSH RFX ON ABNORMAL TO FREE T4: TSH: 5.21 u[IU]/mL — ABNORMAL HIGH (ref 0.450–4.500)

## 2023-05-24 LAB — T4F: T4,Free (Direct): 0.97 ng/dL (ref 0.82–1.77)

## 2023-05-24 NOTE — Telephone Encounter (Signed)
Called and recommended statin. Discussed elevated iron, TSH. All questions answered. Declined statin.

## 2023-05-25 ENCOUNTER — Ambulatory Visit: Payer: Medicaid Other | Admitting: Orthopaedic Surgery

## 2023-05-26 ENCOUNTER — Telehealth: Payer: Self-pay

## 2023-05-26 LAB — CANDIDA 6 SPECIES PROFILE, NAA
C PARAPSILOSIS/TROPICALIS: NEGATIVE
Candida albicans, NAA: NEGATIVE
Candida glabrata, NAA: NEGATIVE
Candida krusei, NAA: NEGATIVE
Candida lusitaniae, NAA: NEGATIVE

## 2023-05-26 NOTE — Telephone Encounter (Signed)
Pharmacy Patient Advocate Encounter   Received notification from CoverMyMeds that prior authorization for [facility] is required/requested.   The patient is insured through [facility] .   PA required; PA started via CoverMyMeds. KEY [id number] . Waiting for clinical questions to populate.

## 2023-05-26 NOTE — Telephone Encounter (Signed)
Pharmacy Patient Advocate Encounter   PA required; PA submitted to above mentioned insurance via CoverMyMeds Key/confirmation #/EOC [id number]. Status is pending

## 2023-05-27 NOTE — Telephone Encounter (Signed)
Pharmacy Patient Advocate Encounter  Received notification from [facility] that Prior Authorization for [facility]  has been APPROVED from 05/26/23 to 05/25/24

## 2023-06-07 ENCOUNTER — Other Ambulatory Visit: Payer: Self-pay | Admitting: Family Medicine

## 2023-06-07 DIAGNOSIS — K581 Irritable bowel syndrome with constipation: Secondary | ICD-10-CM

## 2023-06-16 IMAGING — US US THYROID
1 series · 14 of 25 positions shown · non-contrast
Comparison: None.

CLINICAL DATA: Right thyroid nodule

EXAM:
THYROID ULTRASOUND
TECHNIQUE: Ultrasound examination of the thyroid gland and adjacent soft
tissues was performed.

[Series 1: us thyroid · 0.06mm/px · 14 of 44 slices shown]
[im 1/44]
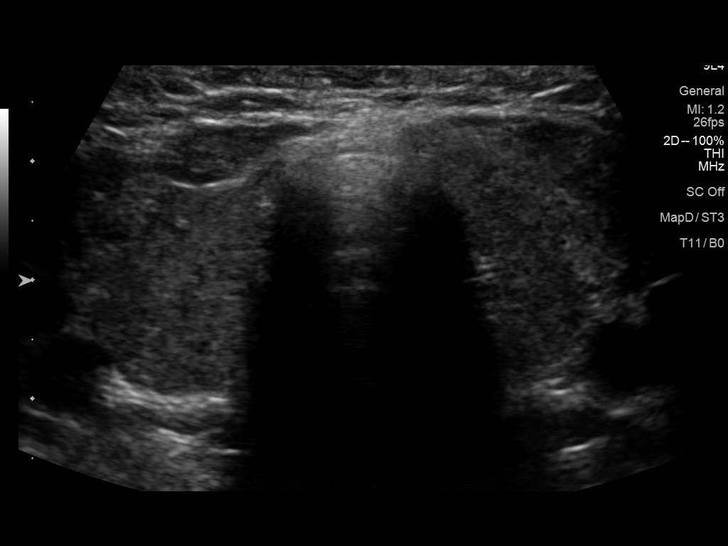
[im 4/44]
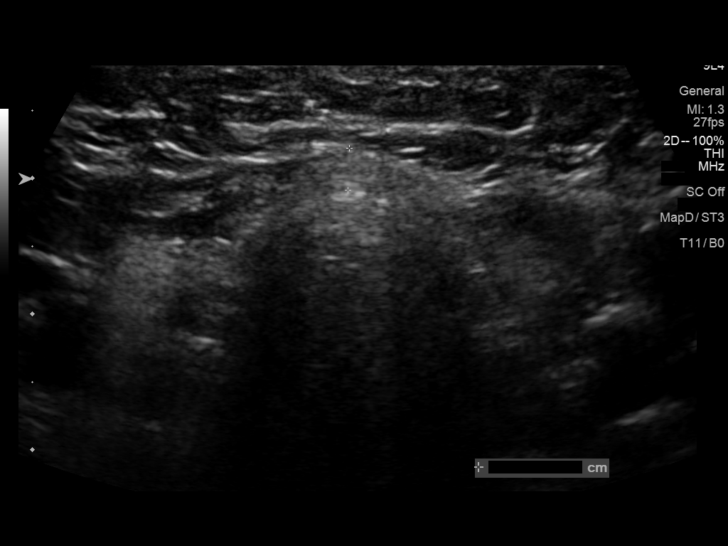
[im 8/44]
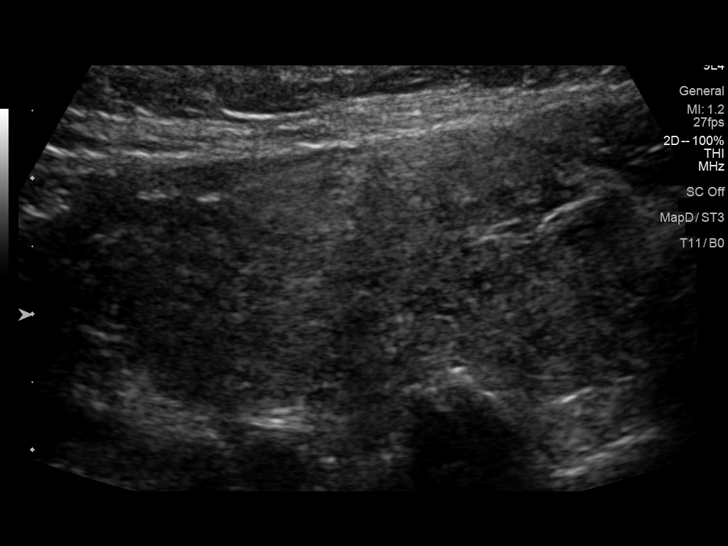
[im 11/44]
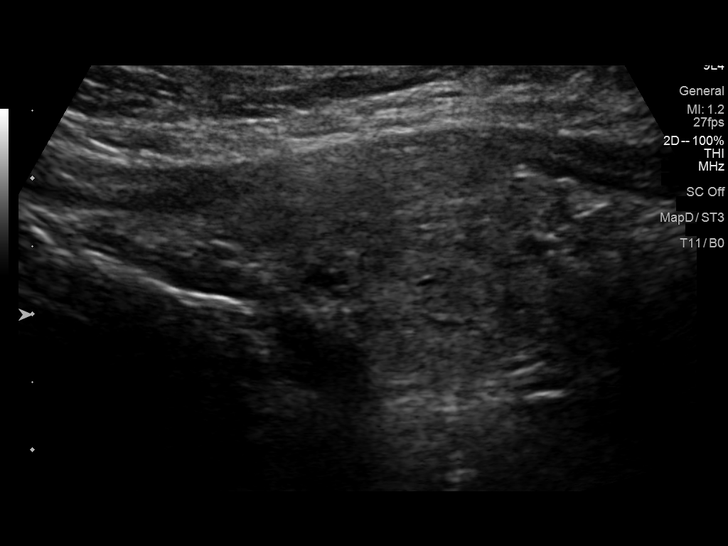
[im 15/44]
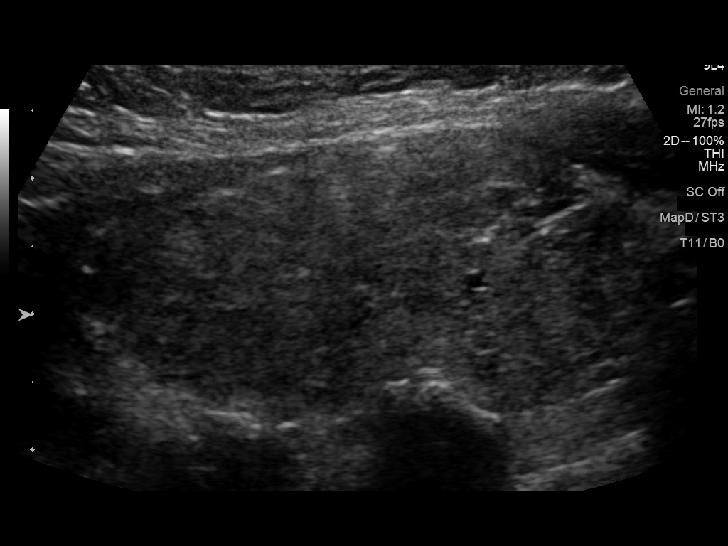
[im 17/44]
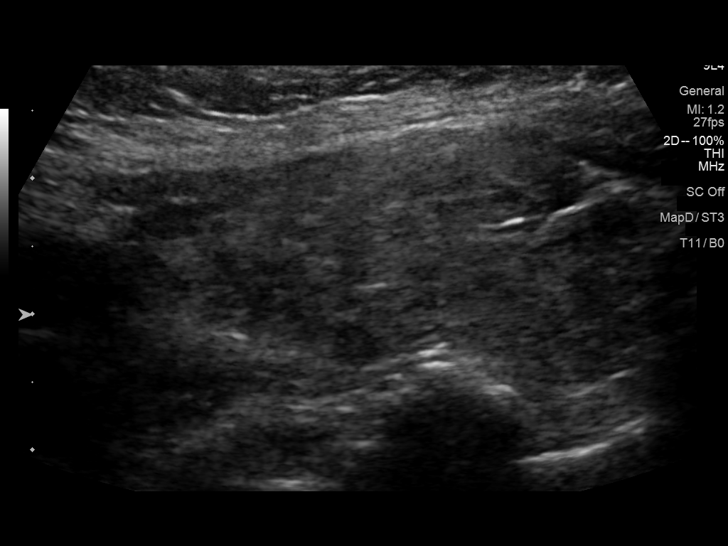
[im 20/44]
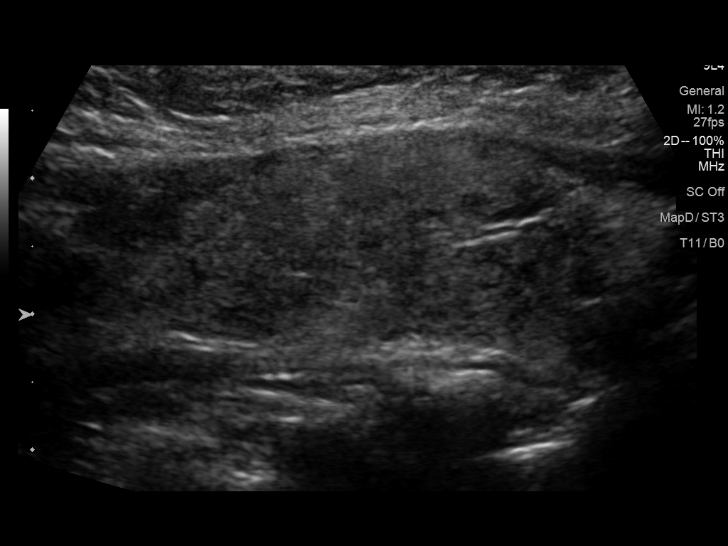
[im 24/44]
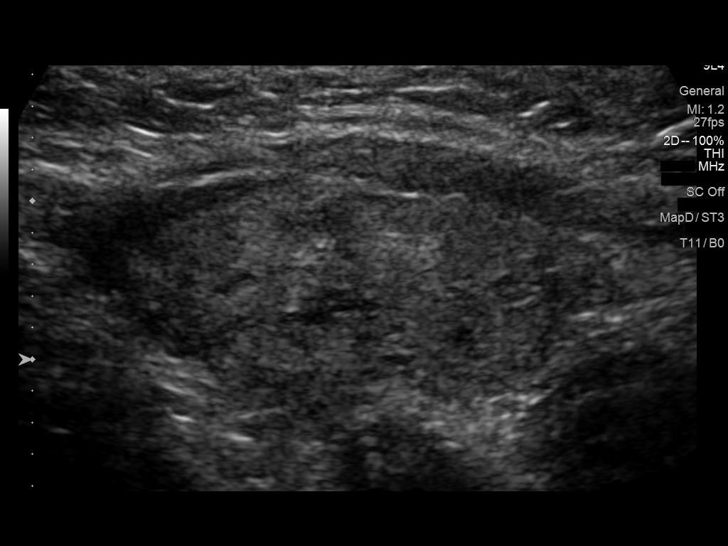
[im 27/44]
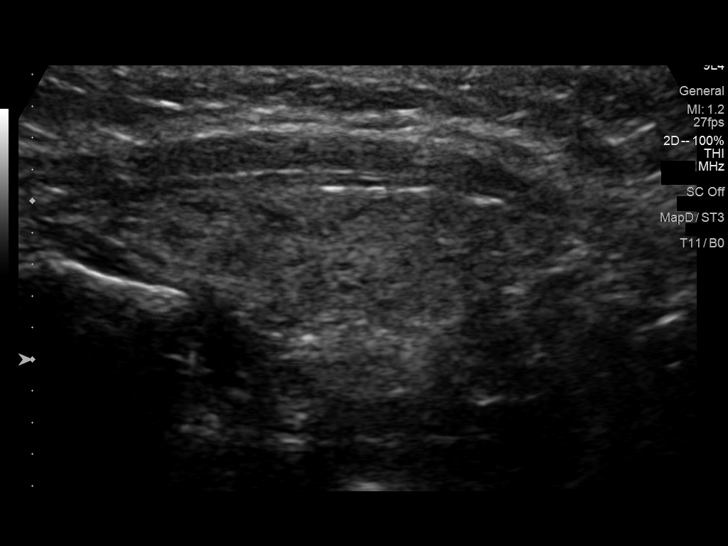
[im 29/44]
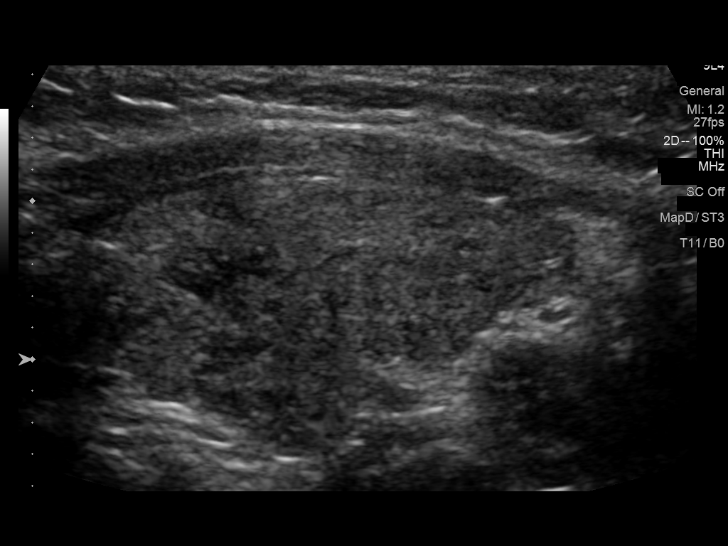
[im 33/44]
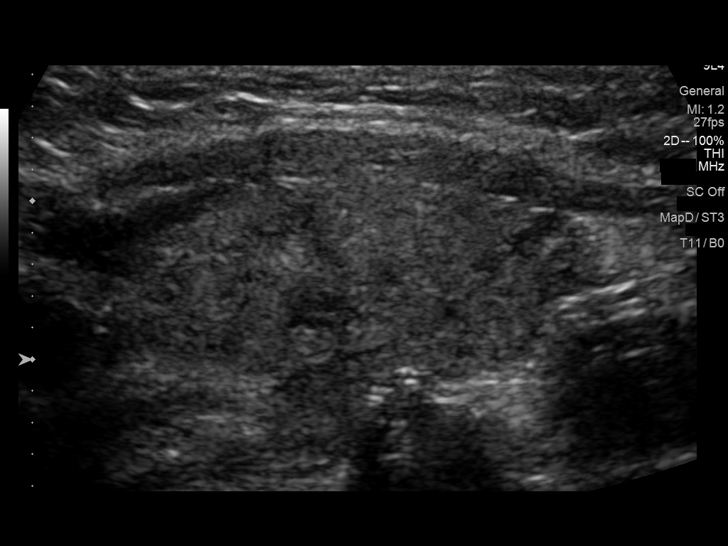
[im 36/44]
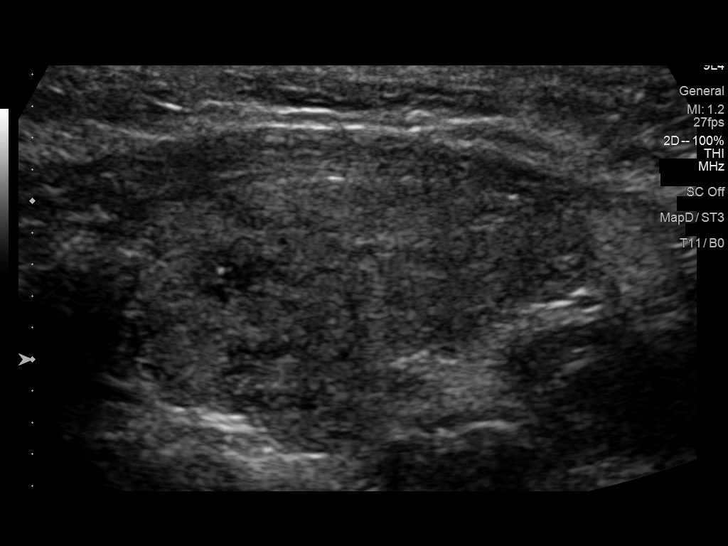
[im 40/44]
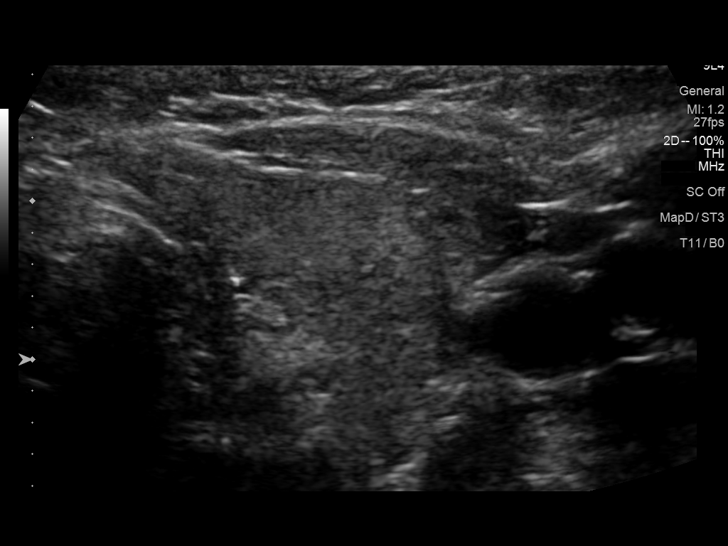
[im 44/44]
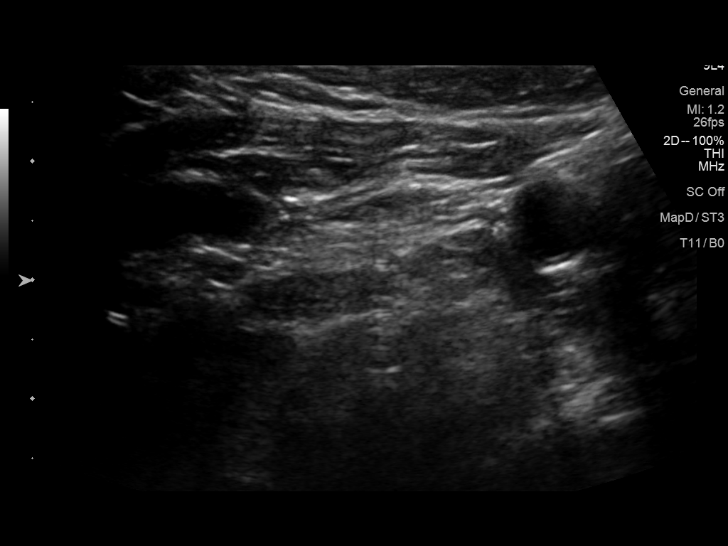

[14 of 25 positions shown; findings below may reference images not displayed]

FINDINGS: Parenchymal Echotexture: Markedly heterogenous

Isthmus: 3 mm

Right lobe: 4.5 x 2.0 x 2.1 cm

Left lobe: 4.0 x 1.4 x 1.7 cm

_________________________________________________________

Estimated total number of nodules >/= 1 cm: 0

Number of spongiform nodules >/=  2 cm not described below (TR1): 0

Number of mixed cystic and solid nodules >/= 1.5 cm not described
below (TR2): 0

_________________________________________________________

Nonspecific marked thyroid heterogeneity compatible with medical
thyroid disease. No discrete nodule or focal abnormality. No
hypervascularity. No regional adenopathy.
IMPRESSION: Thyroid heterogeneity compatible with medical thyroid disease.
Negative for nodule.

The above is in keeping with the ACR TI-RADS recommendations - [HOSPITAL] 9388;[DATE].

## 2023-08-15 ENCOUNTER — Other Ambulatory Visit: Payer: Self-pay | Admitting: *Deleted

## 2023-08-15 DIAGNOSIS — K581 Irritable bowel syndrome with constipation: Secondary | ICD-10-CM

## 2023-08-15 MED ORDER — ESCITALOPRAM OXALATE 10 MG PO TABS: ORAL_TABLET | ORAL | 2 refills | Status: DC

## 2023-08-18 ENCOUNTER — Ambulatory Visit
Admission: EM | Admit: 2023-08-18 | Discharge: 2023-08-18 | Disposition: A | Discharge status: Home / Self Care | Attending: Emergency Medicine | Admitting: Emergency Medicine

## 2023-08-18 ENCOUNTER — Other Ambulatory Visit: Payer: Self-pay

## 2023-08-18 DIAGNOSIS — R21 Rash and other nonspecific skin eruption: Secondary | ICD-10-CM

## 2023-08-18 DIAGNOSIS — B86 Scabies: Secondary | ICD-10-CM

## 2023-08-18 MED ORDER — PERMETHRIN 5 % EX CREA: TOPICAL_CREAM | CUTANEOUS | 0 refills | Status: DC

## 2023-08-18 MED ORDER — IVERMECTIN 3 MG PO TABS: 200.0000 ug/kg | ORAL_TABLET | ORAL | 0 refills | Status: AC

## 2023-08-18 NOTE — ED Provider Notes (Signed)
 [name]    CSN: [id number] Arrival date & time: 08/18/23  1135    HISTORY  No chief complaint on file.  HPI Carmen [name] is a pleasant, 63 y.o. female who presents to urgent care today. Patient complains of exposure to scabies at home, has noticed a slight rash on both of her forearms after scratching her grandsons back, states he has similar rash on his back.  Patient states she has been diagnosed and treated for scabies several times in the past few months and would like to be treated again.  The history is provided by the patient.   Past Medical History:  Diagnosis Date   Adhesive capsulitis of shoulder    Allergy    Borderline personality disorder (HCC)    Carpal tunnel syndrome    COPD (chronic obstructive pulmonary disease) (HCC)    Depression    GERD (gastroesophageal reflux disease)    Hypertension    Nephrolithiasis    Type 2 diabetes mellitus (HCC)    Patient Active Problem List   Diagnosis Date Noted   Trigger thumb 04/28/2023   Diabetic retinopathy (HCC) 04/05/2022   Renal lesion 03/20/2021   Neuropathic pain 03/16/2019   Type 2 diabetes mellitus with neurological complications (HCC) 11/15/2012   Carpal tunnel syndrome 06/10/2010   Osteoarthritis of multiple joints 07/30/2008   Obesity 07/25/2007   Major depressive disorder, recurrent episode (HCC) 08/04/2006   Primary hypertension 08/04/2006   Irritable bowel syndrome 08/04/2006   Past Surgical History:  Procedure Laterality Date   CHOLECYSTECTOMY     TONSILLECTOMY     OB History   No obstetric history on file.    Home Medications    Prior to Admission medications   Medication Sig Start Date End Date Taking? Authorizing Provider  amLODipine (NORVASC) 10 MG tablet Take 1 tablet (10 mg total) by mouth at bedtime. 03/18/23   Westley Chandler, MD  carvedilol (COREG) 25 MG tablet Take 1 tablet (25 mg total) by mouth 2 (two) times daily with a meal. 01/13/23   Westley Chandler, MD   escitalopram (LEXAPRO) 10 MG tablet TAKE TWO TABLETS BY MOUTH ONE TIME DAILY 08/15/23   Caro Laroche, DO  gabapentin (NEURONTIN) 300 MG capsule Take 1 capsule (300 mg total) by mouth 2 (two) times daily. One capsule in the morning and 2 capsules in the evening 05/04/23   Gerrit Heck, DO  hydrochlorothiazide (HYDRODIURIL) 12.5 MG tablet Take 1 tablet (12.5 mg total) by mouth daily. 01/13/23   Westley Chandler, MD  Insulin Pen Needle (BD PEN NEEDLE MICRO U/F) 32G X 6 MM MISC Use to inject ozempic 09/14/21   Westley Chandler, MD  losartan (COZAAR) 100 MG tablet Take 1 tablet (100 mg total) by mouth at bedtime. 04/11/23   Westley Chandler, MD  metFORMIN (GLUCOPHAGE-XR) 500 MG 24 hr tablet TAKE 2 TABLETS BY MOUTH TWICE DAILY 01/13/23   Westley Chandler, MD  Semaglutide, 1 MG/DOSE, (OZEMPIC, 1 MG/DOSE,) 4 MG/3ML SOPN Inject 1 mg into the skin once a week. 05/23/23   Westley Chandler, MD    Family History Family History  Problem Relation Age of Onset   Diabetes Mother    Hypertension Mother    Alzheimer's disease Father    Dementia Father    Ovarian cancer Other        aunt uncertain age   Social History Social History   Tobacco Use   Smoking status: Former  Current packs/day: 0.00    Average packs/day: 0.3 packs/day for 30.0 years (7.5 ttl pk-yrs)    Types: Cigarettes    Start date: 08/12/1977    Quit date: 08/13/2007    Years since quitting: 16.0   Smokeless tobacco: Never   Tobacco comments:    boyfriend smokes  Vaping Use   Vaping status: Never Used  Substance Use Topics   Alcohol use: No    Comment: rarely   Drug use: No   Allergies   Ace inhibitors and Metformin and related  Review of Systems Review of Systems Pertinent findings revealed after performing a 14 point review of systems has been noted in the history of present illness.  Physical Exam Vital Signs BP (!) 149/82 (BP Location: Right Arm)   Pulse 88   Temp 98.1 F (36.7 C) (Oral)   Resp 18   Wt 209 lb (94.8  kg)   LMP 07/15/2015 (Exact Date)   SpO2 98%   BMI 38.23 kg/m   No data found.  Physical Exam Vitals and nursing note reviewed.  Constitutional:      General: She is not in acute distress.    Appearance: Normal appearance.  HENT:     Head: Normocephalic and atraumatic.  Eyes:     Pupils: Pupils are equal, round, and reactive to light.  Cardiovascular:     Rate and Rhythm: Normal rate and regular rhythm.  Pulmonary:     Effort: Pulmonary effort is normal.     Breath sounds: Normal breath sounds.  Musculoskeletal:        General: Normal range of motion.     Cervical back: Normal range of motion and neck supple.  Skin:    General: Skin is warm and dry.     Findings: Rash (Scant linear rash appreciated on left forearm, not appreciated in the right, no intertriginous rash appreciated.) present.  Neurological:     General: No focal deficit present.     Mental Status: She is alert and oriented to person, place, and time. Mental status is at baseline.  Psychiatric:        Mood and Affect: Mood normal.        Behavior: Behavior normal.        Thought Content: Thought content normal.        Judgment: Judgment normal.     Visual Acuity Right Eye Distance:   Left Eye Distance:   Bilateral Distance:    Right Eye Near:   Left Eye Near:    Bilateral Near:     UC Couse / Diagnostics / Procedures:     Radiology No results found.  Procedures Procedures (including critical care time) EKG  Pending results:  Labs Reviewed - No data to display  Medications Ordered in UC: Medications - No data to display  UC Diagnoses / Final Clinical Impressions(s)   I have reviewed the triage vital signs and the nursing notes.  Pertinent labs & imaging results that were available during my care of the patient were reviewed by me and considered in my medical decision making (see chart for details).    Final diagnoses:  Scabies infestation  Rash in adult   Patient advised that while  scabies appears to be unlikely at this time, given history of 2 episodes of scabies in the past few months recommend treat patient more aggressively with both ivermectin and permethrin cream.  Prescription sent to pharmacy with instructions to repeat treatments in 2 weeks.  Patient education  handout provided.  Conservative care recommended.  Return precautions advised.  Please see discharge instructions below for details of plan of care as provided to patient. ED Prescriptions     Medication Sig Dispense Auth. Provider   ivermectin (STROMECTOL) 3 MG TABS tablet Take 6.5 tablets (19,500 mcg total) by mouth every 14 (fourteen) days for 2 doses. Take first dose today.  Take second dose 2 weeks from today. 13 tablet Theadora Rama Scales, PA-C   permethrin (ELIMITE) 5 % cream Apply 5% cream to all areas of the body from the neck down, then wash off after 8 to 14 hours.  Repeat in 14 days 240 g Theadora Rama Scales, PA-C      PDMP not reviewed this encounter.  Pending results:  Labs Reviewed - No data to display    Discharge Instructions      For scabies treatment, I recommend ivermectin tablets and permethrin cream to be used as directed today and again in 14 days.  Please follow-up with your primary care provider regarding your elevated blood pressure.  Thank you for visiting [facility] today.      Disposition Upon Discharge:  Condition: stable for discharge home  Patient presented with an acute illness with associated systemic symptoms and significant discomfort requiring urgent management. In my opinion, this is a condition that a prudent lay person (someone who possesses an average knowledge of health and medicine) may potentially expect to result in complications if not addressed urgently such as respiratory distress, impairment of bodily function or dysfunction of bodily organs.   Routine symptom specific, illness specific and/or disease specific instructions  were discussed with the patient and/or caregiver at length.   As such, the patient has been evaluated and assessed, work-up was performed and treatment was provided in alignment with urgent care protocols and evidence based medicine.  Patient/parent/caregiver has been advised that the patient may require follow up for further testing and treatment if the symptoms continue in spite of treatment, as clinically indicated and appropriate.  Patient/parent/caregiver has been advised to return to the [facility] or PCP if no better; to PCP or the Emergency Department if new signs and symptoms develop, or if the current signs or symptoms continue to change or worsen for further workup, evaluation and treatment as clinically indicated and appropriate  The patient will follow up with their current PCP if and as advised. If the patient does not currently have a PCP we will assist them in obtaining one.   The patient may need specialty follow up if the symptoms continue, in spite of conservative treatment and management, for further workup, evaluation, consultation and treatment as clinically indicated and appropriate.  Patient/parent/caregiver verbalized understanding and agreement of plan as discussed.  All questions were addressed during visit.  Please see discharge instructions below for further details of plan.  This office note has been dictated using Teaching laboratory technician.  Unfortunately, this method of dictation can sometimes lead to typographical or grammatical errors.  I apologize for your inconvenience in advance if this occurs.  Please do not hesitate to reach out to me if clarification is needed.      Theadora Rama Scales, PA-C 08/18/23 1401

## 2023-08-18 NOTE — Discharge Instructions (Addendum)
 For scabies treatment, I recommend ivermectin tablets and permethrin cream to be used as directed today and again in 14 days.  Please follow-up with your primary care provider regarding your elevated blood pressure.  Thank you for visiting [facility] today.

## 2023-08-18 NOTE — ED Triage Notes (Signed)
 Patient concerned and thinks she may have scabies. C/O rash to arms.

## 2023-08-26 ENCOUNTER — Ambulatory Visit: Payer: Medicaid Other | Admitting: Family Medicine

## 2023-09-08 ENCOUNTER — Telehealth: Payer: Self-pay | Admitting: Family Medicine

## 2023-09-08 MED ORDER — ESCITALOPRAM OXALATE 20 MG PO TABS: 20.0000 mg | ORAL_TABLET | Freq: Every day | ORAL | 3 refills | Status: DC

## 2023-09-08 NOTE — Telephone Encounter (Signed)
Refilled lexapro

## 2023-09-14 ENCOUNTER — Ambulatory Visit: Admission: EM | Admit: 2023-09-14 | Discharge: 2023-09-14 | Disposition: A | Discharge status: Home / Self Care

## 2023-09-14 ENCOUNTER — Other Ambulatory Visit: Payer: Self-pay

## 2023-09-14 DIAGNOSIS — M542 Cervicalgia: Secondary | ICD-10-CM
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
## 2023-09-18 ENCOUNTER — Ambulatory Visit

## 2023-09-18 ENCOUNTER — Other Ambulatory Visit: Payer: Self-pay

## 2023-09-18 ENCOUNTER — Ambulatory Visit
Admission: EM | Admit: 2023-09-18 | Discharge: 2023-09-18 | Disposition: A | Discharge status: Home / Self Care | Attending: Emergency Medicine | Admitting: Emergency Medicine

## 2023-09-18 DIAGNOSIS — M25511 Pain in right shoulder: Secondary | ICD-10-CM

## 2023-09-18 DIAGNOSIS — K219 Gastro-esophageal reflux disease without esophagitis: Secondary | ICD-10-CM | POA: Diagnosis not present

## 2023-09-18 DIAGNOSIS — M545 Low back pain, unspecified: Secondary | ICD-10-CM

## 2023-09-18 MED ORDER — KETOROLAC TROMETHAMINE 30 MG/ML IJ SOLN
30.0000 mg | Freq: Once | INTRAMUSCULAR | Status: AC
  Administered 2023-09-18: 30 mg via INTRAMUSCULAR

## 2023-09-18 MED ORDER — METHYLPREDNISOLONE 4 MG PO TBPK: ORAL_TABLET | ORAL | 0 refills | Status: DC

## 2023-09-18 MED ORDER — BACLOFEN 10 MG PO TABS: 10.0000 mg | ORAL_TABLET | Freq: Three times a day (TID) | ORAL | 0 refills | Status: AC

## 2023-09-18 MED ORDER — ACETAMINOPHEN 500 MG PO TABS: 1000.0000 mg | ORAL_TABLET | Freq: Three times a day (TID) | ORAL | 0 refills | Status: AC

## 2023-09-18 NOTE — Discharge Instructions (Addendum)
 The x-ray of your right shoulder did not reveal any signs of acute injury.  The x-ray of your lower back did reveal misalignment between your L4 and L5 vertebrae.  My opinion, this is likely due to muscle spasm.  For this reason, I recommend that you begin anti-inflammatory pain medications and muscle relaxer.  During your visit today, you received an injection of ketorolac, high-dose nonsteroidal anti-inflammatory pain medication that should significantly reduce your pain for the next 6 to 8 hours.   When you pick up your prescription from the pharmacy, please begin taking Tylenol 975 mg 3 times daily (every 8 hours).  Please know that It is safe to take a maximum 3000 mg of Tylenol in a 24-hour period.  Please do not exceed this amount.  Tylenol works best when taken on a scheduled basis.   When you pick up your prescription from the pharmacy, you can begin taking baclofen 10 mg.  This is a highly effective muscle relaxer and antispasmodic which should continue to provide you with relaxation of your tense muscles, allow you to sleep well and to keep your pain under control.  You can continue taking this medication 3 times daily as you need to.  If you find that this medication makes you too sleepy, you can break them in half for your daytime doses and, if needed double them for your nighttime dose.  Do not take more than 30 mg of baclofen in a 24-hour period.   Tomorrow morning, please begin taking methylprednisolone.  Please take 1 full row tablets at once with your breakfast meal.  This will continue to keep your inflammation under control until your body can heal.   Please consider discussing referral to physical therapy with your primary care provider.  Physical therapist are very good at teasing out the underlying cause of acute musculoskeletal pain and helping with prevention of future recurrences.  Please be sure that you are taking Nexium every day, not just as needed.   Please avoid  attempts to stretch or strengthen the affected area until you are feeling completely pain-free.  Attempts to do so will only prolong the healing process.   Thank you for visiting [facility] today.  We appreciate the opportunity to participate in your care.
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
## 2023-10-12 ENCOUNTER — Telehealth: Payer: Self-pay | Admitting: Pharmacist

## 2023-10-12 MED ORDER — OZEMPIC (1 MG/DOSE) 4 MG/3ML ~~LOC~~ SOPN: 1.0000 mg | PEN_INJECTOR | SUBCUTANEOUS | 3 refills | Status: DC

## 2023-10-12 NOTE — Telephone Encounter (Signed)
 Patient contacts office to request new prescription refill for Ozempic  (semaglutide ) 1mg   She shared that she is doing well and denies any side effects.   Review of A1C - Last 05/2023 was 6.3  Weight has been slightly increased with most recent documented weight > 200lbs. In March.   PCP visit with Dr. Bevin Bucks - 10/28/2023 Refill provided.    Total time with patient call and documentation of interaction: 7 minutes.

## 2023-10-27 NOTE — Patient Instructions (Incomplete)
 It was wonderful to see you today.  Please bring ALL of your medications with you to every visit.   Today we talked about:  Please call your eye doctor  [facility]: Elnor Hail, MD [address] [place], [place] [address]   [phone] Please follow up in *** months   Thank you for choosing [facility].   Please call [phone] with any questions about today's appointment.  Please be sure to schedule follow up at the front  desk before you leave today.   Otho Blitz, MD  Family Medicine

## 2023-10-27 NOTE — Progress Notes (Deleted)
    SUBJECTIVE:   CHIEF COMPLAINT: check up  HPI:   Carmen [name] is a 63 y.o.  with history notable for diabetes, obesity, dyslipidemia and HTN presenting for follow up.   PERTINENT  PMH / PSH/Family/Social History : ***  OBJECTIVE:   LMP 07/15/2015 (Exact Date)   Today's weight:  Review of prior weights: There were no vitals filed for this visit.  ***  ASSESSMENT/PLAN:   Assessment & Plan Type 2 diabetes mellitus with neurological complications (HCC)  Primary hypertension  Class 1 obesity due to excess calories with serious comorbidity and body mass index (BMI) of 33.0 to 33.9 in adult  Moderate episode of recurrent major depressive disorder (HCC)      Otho Blitz, MD  Family Medicine Teaching Service  [facility]

## 2023-10-28 ENCOUNTER — Ambulatory Visit: Admitting: Family Medicine

## 2023-11-02 ENCOUNTER — Telehealth: Payer: Self-pay | Admitting: Pharmacist

## 2023-11-02 MED ORDER — ESCITALOPRAM OXALATE 20 MG PO TABS: 20.0000 mg | ORAL_TABLET | Freq: Every day | ORAL | 3 refills | Status: AC

## 2023-11-02 NOTE — Telephone Encounter (Signed)
 Patient contacts office to report she mistakenly was taking 2 X 20mg  of her escitalopram  AND is now too early to fill her prescription.   Discussed with Dr. Bevin Bucks,   [facility] - all override codes would not work and next fill through insurance would be 11/14/2023.  Pharmacist offered a discount card fill of #12 tablets for $15.80 I asked them to prepare that for pick-up.   Contacted patient.  Patient willing/able to afford the short-term discount fill (without insurance) to cover gap until available through insurance.  Patient plans to pick-up later today.   Total time with patient call and documentation of interaction: 23 minutes.

## 2023-11-03 NOTE — Telephone Encounter (Signed)
 Reviewed and agree with Dr Macky Lower plan.

## 2023-11-09 ENCOUNTER — Ambulatory Visit: Admission: EM | Admit: 2023-11-09 | Discharge: 2023-11-09 | Disposition: A | Discharge status: Home / Self Care

## 2023-11-09 DIAGNOSIS — R051 Acute cough: Secondary | ICD-10-CM

## 2023-11-09 DIAGNOSIS — J0100 Acute maxillary sinusitis, unspecified: Secondary | ICD-10-CM

## 2023-11-09 LAB — POC SARS CORONAVIRUS 2 AG -  ED: SARS Coronavirus 2 Ag: NEGATIVE

## 2023-11-09 MED ORDER — BENZONATATE 200 MG PO CAPS: 200.0000 mg | ORAL_CAPSULE | Freq: Three times a day (TID) | ORAL | 0 refills | Status: DC | PRN

## 2023-11-09 MED ORDER — AMOXICILLIN-POT CLAVULANATE 875-125 MG PO TABS: 1.0000 | ORAL_TABLET | Freq: Two times a day (BID) | ORAL | 0 refills | Status: DC

## 2023-11-09 NOTE — ED Provider Notes (Addendum)
 [facility]  Note:  This document was prepared using Dragon voice recognition software and may include unintentional dictation errors.  MRN: [id number] DOB: Apr 04, 1961 DATE: 11/09/23   Subjective:   Chief Complaint:  Chief Complaint  Patient presents with   Cough   Nasal Congestion     HPI: Carmen [name] is a 63 y.o. female presenting for dry cough and nasal congestion for the past 4-5 days. Patient states she started with congestion and a dry cough over the weekend. She states symptoms persisted though out the week and she has since started developing fatigue and myalgias. Reports malaise as well as two episodes of vomiting starting yesterday. Reports one of her friends was recently sick with bronchitis as well. She has taken sudafed intermittently with no improvement. Denies fever, abdominal pain, sore throat, otalgia. Endorses cough, congestion, post nasal drip, vomiting. Presents NAD.  Prior to Admission medications   Medication Sig Start Date End Date Taking? Authorizing Provider  atorvastatin  (LIPITOR) 40 MG tablet Take 40 mg by mouth. 08/21/18  Yes [provider]  pramipexole (MIRAPEX) 1 MG tablet Take 1 mg by mouth 3 (three) times daily.   Yes [provider]  amLODipine  (NORVASC ) 10 MG tablet Take 1 tablet (10 mg total) by mouth at bedtime. 03/18/23   Azell Boll, MD  carvedilol  (COREG ) 25 MG tablet Take 1 tablet (25 mg total) by mouth 2 (two) times daily with a meal. 01/13/23   Azell Boll, MD  escitalopram  (LEXAPRO ) 20 MG tablet Take 1 tablet (20 mg total) by mouth daily. 11/02/23   McDiarmid, Demetra Filter, MD  gabapentin  (NEURONTIN ) 300 MG capsule Take 1 capsule (300 mg total) by mouth 2 (two) times daily. One capsule in the morning and 2 capsules in the evening 05/04/23   Rayma Calandra, DO  hydrochlorothiazide  (HYDRODIURIL ) 12.5 MG tablet Take 1 tablet (12.5 mg total) by mouth daily. 01/13/23   Azell Boll, MD  Insulin Pen Needle (BD PEN  NEEDLE MICRO U/F) 32G X 6 MM MISC Use to inject ozempic  09/14/21   Azell Boll, MD  losartan  (COZAAR ) 100 MG tablet Take 1 tablet (100 mg total) by mouth at bedtime. 04/11/23   Azell Boll, MD  metFORMIN  (GLUCOPHAGE -XR) 500 MG 24 hr tablet TAKE 2 TABLETS BY MOUTH TWICE DAILY 01/13/23   Azell Boll, MD  Semaglutide , 1 MG/DOSE, (OZEMPIC , 1 MG/DOSE,) 4 MG/3ML SOPN Inject 1 mg into the skin once a week. 10/12/23   McDiarmid, Demetra Filter, MD     Allergies  Allergen Reactions   Ace Inhibitors Cough    Other reaction(s): Cough (ALLERGY/intolerance)   Metformin  And Related Diarrhea    Able to tolerate XR with only mild symptoms     History:   Past Medical History:  Diagnosis Date   Adhesive capsulitis of shoulder    Allergy    Borderline personality disorder (HCC)    Carpal tunnel syndrome    COPD (chronic obstructive pulmonary disease) (HCC)    Depression    GERD (gastroesophageal reflux disease)    Hypertension    Nephrolithiasis    Type 2 diabetes mellitus (HCC)      Past Surgical History:  Procedure Laterality Date   CHOLECYSTECTOMY     TONSILLECTOMY      Family History  Problem Relation Age of Onset   Diabetes Mother    Hypertension Mother    Alzheimer's disease Father    Dementia Father    Ovarian cancer Other  aunt uncertain age    Social History   Tobacco Use   Smoking status: Former    Current packs/day: 0.00    Average packs/day: 0.3 packs/day for 30.0 years (7.5 ttl pk-yrs)    Types: Cigarettes    Start date: 08/12/1977    Quit date: 08/13/2007    Years since quitting: 16.2   Smokeless tobacco: Never   Tobacco comments:    boyfriend smokes  Vaping Use   Vaping status: Never Used  Substance Use Topics   Alcohol use: No    Comment: rarely   Drug use: No    Review of Systems  Constitutional:  Positive for fatigue. Negative for fever.  HENT:  Positive for congestion, postnasal drip and rhinorrhea. Negative for ear pain and sore throat.    Respiratory:  Positive for cough.   Gastrointestinal:  Positive for vomiting. Negative for abdominal pain.  Musculoskeletal:  Positive for myalgias.     Objective:   Vitals: BP 138/87 (BP Location: Right Arm)   Pulse (!) 102   Temp 98.9 F (37.2 C) (Oral)   Resp 18   LMP 07/15/2015 (Exact Date)   SpO2 95%   Physical Exam Constitutional:      General: She is not in acute distress.    Appearance: Normal appearance. She is well-developed. She is obese. She is not ill-appearing or toxic-appearing.  HENT:     Head: Normocephalic and atraumatic.     Right Ear: Ear canal normal. A middle ear effusion is present.     Left Ear: Ear canal normal. A middle ear effusion is present.     Nose: Rhinorrhea present. Rhinorrhea is clear.     Right Sinus: Maxillary sinus tenderness present.     Left Sinus: Maxillary sinus tenderness present.     Mouth/Throat:     Pharynx: Oropharynx is clear. Uvula midline. No pharyngeal swelling, oropharyngeal exudate or posterior oropharyngeal erythema.     Tonsils: No tonsillar exudate or tonsillar abscesses.  Cardiovascular:     Rate and Rhythm: Regular rhythm. Tachycardia present.     Heart sounds: Normal heart sounds.  Pulmonary:     Effort: Pulmonary effort is normal.     Breath sounds: Normal breath sounds.     Comments: Clear to auscultation bilaterally  Abdominal:     General: Bowel sounds are normal.     Palpations: Abdomen is soft.     Tenderness: There is no abdominal tenderness.  Lymphadenopathy:     Cervical: No cervical adenopathy.     Right cervical: No superficial cervical adenopathy.    Left cervical: No superficial cervical adenopathy.  Skin:    General: Skin is warm and dry.  Neurological:     General: No focal deficit present.     Mental Status: She is alert.  Psychiatric:        Mood and Affect: Mood and affect normal.     Results:  Labs: Results for orders placed or performed during the hospital encounter of 11/09/23  (from the past 24 hours)  POC SARS Coronavirus 2 Ag-ED - Nasal Swab     Status: None   Collection Time: 11/09/23  9:28 AM  Result Value Ref Range   SARS Coronavirus 2 Ag Negative Negative    Radiology: No results found.   UC Course/Treatments:  Procedures: Procedures   Medications Ordered in UC: Medications - No data to display   Assessment and Plan :     ICD-10-CM   1. Acute non-recurrent maxillary  sinusitis  J01.00     2. Acute cough  R05.1      Acute non-recurrent maxillary sinusitis Afebrile, nontoxic-appearing, NAD. VSS. DDX includes but not limited to: COVID, flu, viral URI, sinusitis, allergic rhinitis COVID was negative today in office. Given recent worsening with more malaise and fatigue, Augmentin 875mg  BID was prescribed for suspect sinusitis. Strict ED precautions were given and patient verbalized understanding.  Acute cough Afebrile, nontoxic-appearing, NAD. VSS. DDX includes but not limited to: COVID, flu, bronchitis, pneumonia, viral URI COVID was negative today in office. Suspect related to post nasal drip. Benzonatate  200mg  TID PRN was prescribed for cough. Strict ED precautions were given and patient verbalized understanding.  ED Discharge Orders          Ordered    amoxicillin -clavulanate (AUGMENTIN) 875-125 MG tablet  Every 12 hours        11/09/23 0933    benzonatate  (TESSALON ) 200 MG capsule  3 times daily PRN        11/09/23 0933             PDMP not reviewed this encounter.     Venezia Sargeant, Bolling Bushy, PA-C 11/09/23 0940    Ramya Vanbergen P, PA-C 11/09/23 0940

## 2023-11-09 NOTE — Discharge Instructions (Signed)
 Your COVID test was negative. A prescription was sent for Augmentin. This is an antibiotic used to treat upper respiratory infections. Take as directed. I have also sent you a prescription for cough medicine as well.   Return in 3-4 days if no improvement. It is very important for you to pay attention to any new symptoms or worsening of your current condition. Please go directly to the Emergency Department immediately should you begin to have any of the following symptoms: shortness of breath, chest pain or difficulty breathing.

## 2023-11-09 NOTE — ED Triage Notes (Addendum)
 Pt c/o cough, wheezing, fatigue, body aches, malaise, aching between her breasts, vomiting x2, throat tightness/reflux, and nasal congestion. Her friend recently had bronchitis that she had been around as well.   Start date: Had intermittent allergy sx; these sx started Friday or Saturday   Home Interventions: Sudafed, maybe

## 2023-11-16 ENCOUNTER — Ambulatory Visit
Admission: EM | Admit: 2023-11-16 | Discharge: 2023-11-16 | Disposition: A | Discharge status: Home / Self Care | Attending: Emergency Medicine | Admitting: Emergency Medicine

## 2023-11-16 DIAGNOSIS — J441 Chronic obstructive pulmonary disease with (acute) exacerbation: Secondary | ICD-10-CM

## 2023-11-16 DIAGNOSIS — J440 Chronic obstructive pulmonary disease with acute lower respiratory infection: Secondary | ICD-10-CM | POA: Diagnosis not present

## 2023-11-16 DIAGNOSIS — J209 Acute bronchitis, unspecified: Secondary | ICD-10-CM | POA: Diagnosis not present

## 2023-11-16 MED ORDER — GUAIFENESIN 400 MG PO TABS: ORAL_TABLET | ORAL | 0 refills | Status: DC

## 2023-11-16 MED ORDER — PROMETHAZINE-DM 6.25-15 MG/5ML PO SYRP: 5.0000 mL | ORAL_SOLUTION | Freq: Every evening | ORAL | 0 refills | Status: DC | PRN

## 2023-11-16 MED ORDER — IPRATROPIUM-ALBUTEROL 0.5-2.5 (3) MG/3ML IN SOLN
3.0000 mL | Freq: Once | RESPIRATORY_TRACT | Status: AC
  Administered 2023-11-16: 3 mL via RESPIRATORY_TRACT

## 2023-11-16 MED ORDER — IPRATROPIUM BROMIDE 0.06 % NA SOLN: 2.0000 | Freq: Three times a day (TID) | NASAL | 1 refills | Status: AC

## 2023-11-16 MED ORDER — COMBIVENT RESPIMAT 20-100 MCG/ACT IN AERS: 1.0000 | INHALATION_SPRAY | Freq: Four times a day (QID) | RESPIRATORY_TRACT | 2 refills | Status: AC

## 2023-11-16 MED ORDER — METHYLPREDNISOLONE 4 MG PO TBPK: ORAL_TABLET | ORAL | 0 refills | Status: DC

## 2023-11-16 MED ORDER — IPRATROPIUM-ALBUTEROL 0.5-2.5 (3) MG/3ML IN SOLN: 3.0000 mL | Freq: Four times a day (QID) | RESPIRATORY_TRACT | 2 refills | Status: AC | PRN

## 2023-11-16 MED ORDER — PSEUDOEPHEDRINE HCL 30 MG PO TABS: 60.0000 mg | ORAL_TABLET | Freq: Three times a day (TID) | ORAL | 0 refills | Status: AC | PRN

## 2023-11-16 NOTE — Discharge Instructions (Addendum)
 Please read below to learn more about the medications, dosages and frequencies that I recommend to help alleviate your symptoms and to get you feeling better soon:   Medrol  Dosepak (methylprednisolone ): This is a steroid that will significantly calm your upper and lower airways.  Please take one row of tablets daily with your breakfast meal starting tomorrow morning until the prescription is complete.      Atrovent (ipratropium): This is an excellent nasal decongestant spray that does not cause rebound congestion.  Please instill 2 sprays into each nare with each use, you may use this up to 3 times daily.  Once you find that you are forgetting to use the spray more often that you remember to use it, you will know that you no longer need it.   Combivent, DuoNeb (ipratropium and albuterol ): This inhaled medication contains a short acting beta agonist bronchodilator and a muscarinic bronchodilator.  This medication works on the smooth muscle that opens and constricts of your airways by relaxing the muscle.  The result of relaxation of the smooth muscle is increased air movement and improved work of breathing.  This is a short acting medication that can be used every 4-6 hours as needed for increased work of breathing, shortness of breath, wheezing and excessive coughing.  You can either use it with your nebulizer machine or you can use the handheld inhaler.  For the next few days, you may find that the nebulizer machine provides better relief than the handheld inhaler.   Sudafed (pseudoephedrine): This is a decongestant.  This single symptom reliever is a safe medication that reduces mucus production, runny nose, sinus drainage and chest congestion.  I recommend taking 2 tablets, 60 mg, 2-3 times a day.  If you find that 60 mg is too strong, you can certainly just take one tablet, 30 mg, 2 to 3 times a day.  Please keep in mind that while this medication is available over-the-counter, you will need to purchase  it directly from the pharmacist due to known misuse of this drug for other purposes.  I do not recommend the extended relief version of this medication as it makes some patients feel jittery or jumpy and can interfere with sleep.   Robitussin, Mucinex (guaifenesin): This is a daytime expectorant.  This single symptom reliever helps break up chest congestion and loosen up thick nasal drainage making phlegm and drainage easier to cough up and to blow out from your nose.  I recommend taking 400 mg in either liquid or tablet form three times daily as needed.  I do not recommend the 12-hour extended relief version or doses higher than 400 mg per each dose as these often make some patients feel jittery or jumpy and can interfere with sleep.  I also do not recommend that you purchase guaifenesin with the ingredient  DM which is dextromethorphan, a cough suppressant which I only recommend taking at bedtime.  Guaifenesin 400 mg is a safe dose for people who are being treated for high blood pressure.     Promethazine  DM: Promethazine  is both a nasal decongestant that dries up mucous membranes and an antinausea medication.  Promethazine  often makes most patients feel fairly sleepy.  DM is dextromethorphan, a single symptom reliever which is a cough suppressant found in many over-the-counter cough medications and combination cold preparations.  Please take 5 mL before bedtime to minimize your cough which will help you sleep better.  I have sent a prescription for this medication to your  pharmacy because it cannot be purchased over-the-counter.   If symptoms have not meaningfully improved in the next 10 to 14 days, please return for repeat evaluation or follow-up with your regular provider.  If symptoms have worsened in the next 3 to 5 days, please go to the emergency room for further evaluation.    Thank you for visiting urgent care today.  We appreciate the opportunity to participate in your care.

## 2023-11-16 NOTE — ED Provider Notes (Signed)
 [name]    CSN: [id number] Arrival date & time: 11/16/23  4098    HISTORY   Chief Complaint  Patient presents with   Emesis   Cough   HPI Carmen [name] is a pleasant, 62 y.o. female who presents to urgent care today. Pt presented a week ago and has been since for about two weeks.  Patient was seen 1 week ago here at this location for similar symptoms, was provided with prescriptions for Augmentin  and Tessalon  Perles.  Patient states she has 1 more day of Augmentin  to take and states that neither medication has been particularly helpful.  Pt states that since seeing us  last week she has vomited phlegm every day.  States she is only eating 1-2 meals a day. She is still having a cough, fatigue, body aches, malaise, dizziness.  Patient adds she almost called 911 last night due to waking up and gasping and not be abe to breath. Pt states she threw up a ball of phlegm. She then took a Sudafed which seemed to help.  Patient was seen in May of last year for these exact same symptoms.  Patient has a diagnosis of COPD but has not been following up with anyone regularly for this.  Patient states she has tried using the albuterol  inhaler that was prescribed for her last year but thinks it may have run out.  The history is provided by the patient.  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   Diabetes mellitus without complication (HCC) 11/15/2012   Carpal tunnel syndrome 06/10/2010   Osteoarthritis of multiple joints 07/30/2008   Obesity 07/25/2007   Major depressive disorder, recurrent episode (HCC) 08/04/2006   Primary hypertension 08/04/2006   Irritable bowel syndrome 08/04/2006   Past Surgical History:  Procedure Laterality Date   CHOLECYSTECTOMY     TONSILLECTOMY     OB History     Gravida  3   Para      Term      Preterm      AB      Living         SAB      IAB      Ectopic      Multiple      Live Births  3          Home Medications    Prior to Admission medications   Medication Sig Start Date End Date Taking? Authorizing Provider  amLODipine  (NORVASC ) 10 MG tablet Take 1 tablet (10 mg total) by mouth at bedtime. 03/18/23   Azell Boll, MD  amoxicillin -clavulanate (AUGMENTIN ) 875-125 MG tablet Take 1 tablet by mouth every 12 (twelve) hours for 7 days. 11/09/23 11/16/23  Hermanns, Ashlee P, PA-C  atorvastatin  (LIPITOR) 40 MG tablet Take 40 mg by mouth. 08/21/18   [provider]  benzonatate  (TESSALON ) 200 MG capsule Take 1 capsule (200 mg total) by mouth 3 (three) times daily as needed for cough. 11/09/23   Hermanns, Ashlee P, PA-C  carvedilol  (COREG ) 25 MG tablet Take 1 tablet (25 mg total) by mouth 2 (two) times daily with a meal. 01/13/23   Azell Boll, MD  escitalopram  (LEXAPRO ) 20 MG tablet Take 1 tablet (20 mg total) by mouth daily. 11/02/23   McDiarmid, Demetra Filter, MD  gabapentin  (NEURONTIN ) 300 MG capsule Take 1 capsule (300 mg total) by mouth 2 (two) times daily. One capsule in the morning and 2 capsules in the evening 05/04/23   Rayma Calandra, DO  hydrochlorothiazide  (HYDRODIURIL ) 12.5 MG tablet Take 1 tablet (12.5 mg total) by mouth daily. 01/13/23   Azell Boll, MD  Insulin Pen Needle (BD PEN NEEDLE MICRO U/F) 32G X 6 MM MISC Use to inject ozempic  09/14/21   Azell Boll, MD  losartan   (COZAAR ) 100 MG tablet Take 1 tablet (100 mg total) by mouth at bedtime. 04/11/23   Azell Boll, MD  metFORMIN  (GLUCOPHAGE -XR) 500 MG 24 hr tablet TAKE 2 TABLETS BY MOUTH TWICE DAILY 01/13/23   Azell Boll, MD  pramipexole (MIRAPEX) 1 MG tablet Take 1 mg by mouth 3 (three) times daily.    [provider]  Semaglutide , 1 MG/DOSE, (OZEMPIC , 1 MG/DOSE,) 4 MG/3ML SOPN Inject 1 mg into the skin once a week. 10/12/23   McDiarmid, Demetra Filter, MD    Family History Family History  Problem Relation Age of Onset   Diabetes Mother    Hypertension Mother    Alzheimer's disease Father    Dementia Father    Ovarian cancer Other        aunt uncertain age   Social History Social History   Tobacco Use   Smoking status: Former    Current packs/day: 0.00    Average packs/day: 0.3 packs/day for 30.0 years (7.5 ttl pk-yrs)    Types: Cigarettes    Start date: 08/12/1977    Quit date: 08/13/2007    Years since quitting: 16.2   Smokeless tobacco: Never   Tobacco comments:    boyfriend smokes  Vaping Use   Vaping status: Never Used  Substance Use Topics   Alcohol use: No    Comment: rarely   Drug use: No   Allergies   Ace inhibitors and Metformin  and related  Review of Systems Review of Systems  Respiratory:  Positive for cough.    Pertinent findings revealed after performing a 14 point review of systems has been noted in the history of present illness.  Physical Exam Vital Signs BP 133/84 (BP Location: Right Arm)   Pulse 89   Temp 98.5 F (36.9 C) (Oral)   Resp 18   LMP 07/15/2015 (Exact Date)   SpO2 96%   No data found.  Physical Exam Vitals and nursing note reviewed.  Constitutional:      General: She is not in acute distress.    Appearance: Normal appearance. She is not ill-appearing.  HENT:     Head: Normocephalic and atraumatic.     Salivary Glands: Right salivary gland is not diffusely enlarged or tender. Left salivary gland is not diffusely enlarged or tender.      Right Ear: Ear canal and external ear normal. No drainage. A middle ear effusion is present. There is no impacted cerumen. Tympanic membrane is bulging. Tympanic membrane is not injected or erythematous.  Left Ear: Ear canal and external ear normal. No drainage. A middle ear effusion is present. There is no impacted cerumen. Tympanic membrane is bulging. Tympanic membrane is not injected or erythematous.     Ears:     Comments: Bilateral EACs normal, both TMs bulging with clear fluid    Nose: Rhinorrhea present. No nasal deformity, septal deviation, signs of injury, nasal tenderness, mucosal edema or congestion. Rhinorrhea is clear.     Right Nostril: Occlusion present. No foreign body, epistaxis or septal hematoma.     Left Nostril: Occlusion present. No foreign body, epistaxis or septal hematoma.     Right Turbinates: Enlarged, swollen and pale.     Left Turbinates: Enlarged, swollen and pale.     Right Sinus: No maxillary sinus tenderness or frontal sinus tenderness.     Left Sinus: No maxillary sinus tenderness or frontal sinus tenderness.     Mouth/Throat:     Lips: Pink. No lesions.     Mouth: Mucous membranes are moist. No oral lesions.     Pharynx: Oropharynx is clear. Uvula midline. No posterior oropharyngeal erythema or uvula swelling.     Tonsils: No tonsillar exudate. 0 on the right. 0 on the left.     Comments: Postnasal drip Eyes:     General: Lids are normal.        Right eye: No discharge.        Left eye: No discharge.     Extraocular Movements: Extraocular movements intact.     Conjunctiva/sclera: Conjunctivae normal.     Right eye: Right conjunctiva is not injected.     Left eye: Left conjunctiva is not injected.  Neck:     Trachea: Trachea and phonation normal.  Cardiovascular:     Rate and Rhythm: Normal rate and regular rhythm.     Pulses: Normal pulses.     Heart sounds: Normal heart sounds. No murmur heard.    No friction rub. No gallop.  Pulmonary:      Effort: Pulmonary effort is normal. Prolonged expiration present. No tachypnea, bradypnea, accessory muscle usage, respiratory distress or retractions.     Breath sounds: No stridor, decreased air movement or transmitted upper airway sounds. Examination of the right-upper field reveals wheezing. Examination of the left-upper field reveals wheezing. Examination of the right-middle field reveals wheezing. Examination of the left-middle field reveals wheezing. Examination of the right-lower field reveals wheezing. Examination of the left-lower field reveals wheezing. Wheezing present. No decreased breath sounds, rhonchi or rales.     Comments: Repeat auscultation post nebulized bronchodilator revealed improved work of breathing and near resolution of wheezing, scant wheezing remained in right upper lung fields. Chest:     Chest wall: No tenderness.  Musculoskeletal:        General: Normal range of motion.     Cervical back: Normal range of motion and neck supple. Normal range of motion.  Lymphadenopathy:     Cervical: No cervical adenopathy.  Skin:    General: Skin is warm and dry.     Findings: No erythema or rash.  Neurological:     General: No focal deficit present.     Mental Status: She is alert and oriented to person, place, and time.  Psychiatric:        Mood and Affect: Mood normal.        Behavior: Behavior normal.     Visual Acuity Right Eye Distance:   Left Eye Distance:   Bilateral Distance:  Right Eye Near:   Left Eye Near:    Bilateral Near:     UC Couse / Diagnostics / Procedures:     Radiology No results found.  Procedures Procedures (including critical care time) EKG  Pending results:  Labs Reviewed - No data to display  Medications Ordered in UC: Medications  ipratropium-albuterol  (DUONEB) 0.5-2.5 (3) MG/3ML nebulizer solution 3 mL (3 mLs Nebulization Given 11/16/23 0939)    UC Diagnoses / Final Clinical Impressions(s)   I have reviewed the triage  vital signs and the nursing notes.  Pertinent labs & imaging results that were available during my care of the patient were reviewed by me and considered in my medical decision making (see chart for details).    Final diagnoses:  COPD exacerbation (HCC)  Bronchitis, chronic obstructive w acute bronchitis (HCC)   Patient had excellent response to DuoNeb treatment, imaging not indicated. Provided patient with nebulizer machine and prescriptions for DuoNebs as well as a Combivent inhaler that she can carry with her when she is out and about.  Because patient continued to have scant wheezing after DuoNeb treatment, have provided her with a tapering dose of methylprednisolone  and advised her she can discontinue once she is feeling completely better.  Patient provided with Atrovent nasal spray to use in addition to her Nasacort  which she states she has been using at the end of her visit.  Patient states she has Medicaid so I have provided her with prescriptions for Sudafed, guaifenesin as well.  Promethazine  DM provided for nighttime cough so patient can get some sleep.  Please see discharge instructions below for details of plan of care as provided to patient. ED Prescriptions     Medication Sig Dispense Auth. Provider   Ipratropium-Albuterol  (COMBIVENT RESPIMAT) 20-100 MCG/ACT AERS respimat Inhale 1 puff into the lungs every 6 (six) hours. 1 each Eloise Hake Scales, PA-C   ipratropium-albuterol  (DUONEB) 0.5-2.5 (3) MG/3ML SOLN Take 3 mLs by nebulization every 6 (six) hours as needed. 360 mL Eloise Hake Scales, PA-C   methylPREDNISolone  (MEDROL  DOSEPAK) 4 MG TBPK tablet Take 24 mg on day 1, 20 mg on day 2, 16 mg on day 3, 12 mg on day 4, 8 mg on day 5, 4 mg on day 6.  Take all tablets in each row at once, do not spread tablets out throughout the day. 21 tablet Eloise Hake Scales, PA-C   guaifenesin (HUMIBID E) 400 MG TABS tablet Take 1 tablet 3 times daily as needed for chest congestion and  cough 30 tablet Eloise Hake Scales, PA-C   promethazine -dextromethorphan (PROMETHAZINE -DM) 6.25-15 MG/5ML syrup Take 5 mLs by mouth at bedtime as needed for cough. 60 mL Eloise Hake Scales, PA-C   pseudoephedrine (SUDAFED) 30 MG tablet Take 2 tablets (60 mg total) by mouth every 8 (eight) hours as needed for up to 3 days for congestion. 18 tablet Eloise Hake Scales, PA-C   ipratropium (ATROVENT) 0.06 % nasal spray Place 2 sprays into both nostrils 3 (three) times daily. As needed for nasal congestion, runny nose 15 mL Eloise Hake Scales, PA-C      PDMP not reviewed this encounter.  Pending results:  Labs Reviewed - No data to display    Discharge Instructions      Please read below to learn more about the medications, dosages and frequencies that I recommend to help alleviate your symptoms and to get you feeling better soon:   Medrol  Dosepak (methylprednisolone ): This is a steroid that will significantly calm  your upper and lower airways.  Please take one row of tablets daily with your breakfast meal starting tomorrow morning until the prescription is complete.      Atrovent (ipratropium): This is an excellent nasal decongestant spray that does not cause rebound congestion.  Please instill 2 sprays into each nare with each use, you may use this up to 3 times daily.  Once you find that you are forgetting to use the spray more often that you remember to use it, you will know that you no longer need it.   Combivent, DuoNeb (ipratropium and albuterol ): This inhaled medication contains a short acting beta agonist bronchodilator and a muscarinic bronchodilator.  This medication works on the smooth muscle that opens and constricts of your airways by relaxing the muscle.  The result of relaxation of the smooth muscle is increased air movement and improved work of breathing.  This is a short acting medication that can be used every 4-6 hours as needed for increased work of breathing,  shortness of breath, wheezing and excessive coughing.  You can either use it with your nebulizer machine or you can use the handheld inhaler.  For the next few days, you may find that the nebulizer machine provides better relief than the handheld inhaler.   Sudafed (pseudoephedrine): This is a decongestant.  This single symptom reliever is a safe medication that reduces mucus production, runny nose, sinus drainage and chest congestion.  I recommend taking 2 tablets, 60 mg, 2-3 times a day.  If you find that 60 mg is too strong, you can certainly just take one tablet, 30 mg, 2 to 3 times a day.  Please keep in mind that while this medication is available over-the-counter, you will need to purchase it directly from the pharmacist due to known misuse of this drug for other purposes.  I do not recommend the extended relief version of this medication as it makes some patients feel jittery or jumpy and can interfere with sleep.   Robitussin, Mucinex (guaifenesin): This is a daytime expectorant.  This single symptom reliever helps break up chest congestion and loosen up thick nasal drainage making phlegm and drainage easier to cough up and to blow out from your nose.  I recommend taking 400 mg in either liquid or tablet form three times daily as needed.  I do not recommend the 12-hour extended relief version or doses higher than 400 mg per each dose as these often make some patients feel jittery or jumpy and can interfere with sleep.  I also do not recommend that you purchase guaifenesin with the ingredient  DM which is dextromethorphan, a cough suppressant which I only recommend taking at bedtime.  Guaifenesin 400 mg is a safe dose for people who are being treated for high blood pressure.     Promethazine  DM: Promethazine  is both a nasal decongestant that dries up mucous membranes and an antinausea medication.  Promethazine  often makes most patients feel fairly sleepy.  DM is dextromethorphan, a single symptom  reliever which is a cough suppressant found in many over-the-counter cough medications and combination cold preparations.  Please take 5 mL before bedtime to minimize your cough which will help you sleep better.  I have sent a prescription for this medication to your pharmacy because it cannot be purchased over-the-counter.   If symptoms have not meaningfully improved in the next 10 to 14 days, please return for repeat evaluation or follow-up with your regular provider.  If symptoms have worsened in  the next 3 to 5 days, please go to the emergency room for further evaluation.    Thank you for visiting urgent care today.  We appreciate the opportunity to participate in your care.     Disposition Upon Discharge:  Condition: stable for discharge home  Patient presented with an acute illness with associated systemic symptoms and significant discomfort requiring urgent management. In my opinion, this is a condition that a prudent lay person (someone who possesses an average knowledge of health and medicine) may potentially expect to result in complications if not addressed urgently such as respiratory distress, impairment of bodily function or dysfunction of bodily organs.   Routine symptom specific, illness specific and/or disease specific instructions were discussed with the patient and/or caregiver at length.   As such, the patient has been evaluated and assessed, work-up was performed and treatment was provided in alignment with urgent care protocols and evidence based medicine.  Patient/parent/caregiver has been advised that the patient may require follow up for further testing and treatment if the symptoms continue in spite of treatment, as clinically indicated and appropriate.  Patient/parent/caregiver has been advised to return to the [facility] or PCP if no better; to PCP or the Emergency Department if new signs and symptoms develop, or if the current signs or symptoms continue to change or worsen for  further workup, evaluation and treatment as clinically indicated and appropriate  The patient will follow up with their current PCP if and as advised. If the patient does not currently have a PCP we will assist them in obtaining one.   The patient may need specialty follow up if the symptoms continue, in spite of conservative treatment and management, for further workup, evaluation, consultation and treatment as clinically indicated and appropriate.  Patient/parent/caregiver verbalized understanding and agreement of plan as discussed.  All questions were addressed during visit.  Please see discharge instructions below for further details of plan.  This office note has been dictated using Teaching laboratory technician.  Unfortunately, this method of dictation can sometimes lead to typographical or grammatical errors.  I apologize for your inconvenience in advance if this occurs.  Please do not hesitate to reach out to me if clarification is needed.      Eloise Hake Scales, PA-C 11/16/23 1057

## 2023-11-16 NOTE — ED Triage Notes (Signed)
 Pt presented a week ago and has been since for about two weeks. Pt states since seeing us  she has vomited every day. She is only eating 1-2 meals a day.   She is still having a cough, fatigue, body aches, malaise, dizziness.   Pt states she almost called 911 last night due to waking up and gasping and not be abe to breath. Pt states she threw up a ball of phlegm. She then took a Sudafed.

## 2023-11-24 ENCOUNTER — Ambulatory Visit: Payer: Self-pay | Admitting: Internal Medicine

## 2023-11-24 ENCOUNTER — Ambulatory Visit
Admission: EM | Admit: 2023-11-24 | Discharge: 2023-11-24 | Disposition: A | Discharge status: Home / Self Care | Attending: Internal Medicine | Admitting: Internal Medicine

## 2023-11-24 ENCOUNTER — Other Ambulatory Visit: Payer: Self-pay

## 2023-11-24 ENCOUNTER — Ambulatory Visit

## 2023-11-24 DIAGNOSIS — R051 Acute cough: Secondary | ICD-10-CM | POA: Diagnosis not present

## 2023-11-24 DIAGNOSIS — J441 Chronic obstructive pulmonary disease with (acute) exacerbation: Secondary | ICD-10-CM

## 2023-11-24 DIAGNOSIS — R059 Cough, unspecified: Secondary | ICD-10-CM

## 2023-11-24 MED ORDER — SPACER/AERO-HOLDING CHAMBERS DEVI: 0 refills | Status: AC

## 2023-11-24 MED ORDER — DOXYCYCLINE HYCLATE 100 MG PO CAPS
100.0000 mg | ORAL_CAPSULE | Freq: Two times a day (BID) | ORAL | 0 refills | Status: AC
Start: 2023-11-24 — End: 2023-12-01

## 2023-11-24 MED ORDER — METHYLPREDNISOLONE ACETATE 80 MG/ML IJ SUSP
40.0000 mg | Freq: Once | INTRAMUSCULAR | Status: AC
  Administered 2023-11-24: 40 mg via INTRAMUSCULAR

## 2023-11-24 NOTE — Discharge Instructions (Addendum)
 I recommend you start using the inhaler that was prescribed at your last visit. I recommend you follow up with the pharmacy to help with showing you how to use. I have also sent a prescription for a spacer to help.   You were also given an order for an x-ray. Take this to [facility] for imaging. You can walk right in with your x-ray order and do not have to be seen. I have attached their information to your paperwork.   Finally, a prescription was sent for Doxycycline . This is an antibiotic used to treat upper respiratory infections. Take as directed.   I recommend you follow up with your PCP as well. I feel you will need more long term management and may need a referral to pulmonology as well.   Please go directly to the Emergency Department immediately should you begin to have any of the following symptoms: shortness of breath, chest pain or difficulty breathing.

## 2023-11-24 NOTE — ED Provider Notes (Signed)
 [facility]  Note:  This document was prepared using Dragon voice recognition software and may include unintentional dictation errors.  MRN: [id number] DOB: July 30, 1960 DATE: 11/24/23   Subjective:  Chief Complaint:  Chief Complaint  Patient presents with   Cough     HPI: Carmen [name] is a 63 y.o. female presenting for cough and congestion for the past 3 weeks. Patient was first seen in our office on 11/09/2023. At that time, she reported new onset of a dry cough and nasal congestion. She was placed on Augmentin  and Benzonatate  for sinusitis. She presented a week later on 11/16/2023. She reported that time that she has no relief with either medication and was vomiting phlegm. She reported continued cough, fatigue, and malaise. At that time, she was diagnosed with a COPD exacerbation. She was started on a Medrol  DosePak as well as given a nebulizer with solution and a Combivent  inhaler. She was also given two different cough medications.  Today, she presents for continued symptoms. She reports ongoing cough and fatigue. She states she was feeling better a few days ago and got out, but symptoms returned over the past 2 days. She reports ongoing cough and congestion. She states she feels that she has phlegm in her chest that she cannot get up. She reports trying to use her nebulizer one to two times a day. However, she likes to leave the house, so has a hard time with treatments. She has not used her inhaler at all because she cannot figure out how to use it.   Denies fever, nausea/vomiting, sore throat. Endorses cough, congestion. Presents NAD.  Prior to Admission medications   Medication Sig Start Date End Date Taking? Authorizing Provider  amLODipine  (NORVASC ) 10 MG tablet Take 1 tablet (10 mg total) by mouth at bedtime. 03/18/23   Azell Boll, MD  carvedilol  (COREG ) 25 MG tablet Take 1 tablet (25 mg total) by mouth 2 (two) times daily with a meal. 01/13/23   Azell Boll,  MD  escitalopram  (LEXAPRO ) 20 MG tablet Take 1 tablet (20 mg total) by mouth daily. 11/02/23   McDiarmid, Demetra Filter, MD  esomeprazole  (NEXIUM ) 10 MG packet Take 10 mg by mouth daily before breakfast.    [provider]  gabapentin  (NEURONTIN ) 300 MG capsule Take 1 capsule (300 mg total) by mouth 2 (two) times daily. One capsule in the morning and 2 capsules in the evening 05/04/23   Rayma Calandra, DO  hydrochlorothiazide  (HYDRODIURIL ) 12.5 MG tablet Take 1 tablet (12.5 mg total) by mouth daily. 01/13/23   Azell Boll, MD  Insulin Pen Needle (BD PEN NEEDLE MICRO U/F) 32G X 6 MM MISC Use to inject ozempic  09/14/21   Azell Boll, MD  ipratropium (ATROVENT ) 0.06 % nasal spray Place 2 sprays into both nostrils 3 (three) times daily. As needed for nasal congestion, runny nose 11/16/23   Eloise Hake Scales, PA-C  Ipratropium-Albuterol  (COMBIVENT  RESPIMAT) 20-100 MCG/ACT AERS respimat Inhale 1 puff into the lungs every 6 (six) hours. 11/16/23 02/14/24  Eloise Hake Scales, PA-C  ipratropium-albuterol  (DUONEB) 0.5-2.5 (3) MG/3ML SOLN Take 3 mLs by nebulization every 6 (six) hours as needed. 11/16/23 02/14/24  Eloise Hake Scales, PA-C  losartan  (COZAAR ) 100 MG tablet Take 1 tablet (100 mg total) by mouth at bedtime. 04/11/23   Azell Boll, MD  metFORMIN  (GLUCOPHAGE -XR) 500 MG 24 hr tablet TAKE 2 TABLETS BY MOUTH TWICE DAILY 01/13/23   Azell Boll, MD  promethazine -dextromethorphan (PROMETHAZINE -DM) 6.25-15  MG/5ML syrup Take 5 mLs by mouth at bedtime as needed for cough. 11/16/23   Eloise Hake Scales, PA-C  Semaglutide , 1 MG/DOSE, (OZEMPIC , 1 MG/DOSE,) 4 MG/3ML SOPN Inject 1 mg into the skin once a week. 10/12/23   McDiarmid, Demetra Filter, MD     Allergies  Allergen Reactions   Ace Inhibitors Cough    Other reaction(s): Cough (ALLERGY/intolerance)   Metformin  And Related Diarrhea    Able to tolerate XR with only mild symptoms     History:   Past Medical History:  Diagnosis Date    Adhesive capsulitis of shoulder    Allergy    Borderline personality disorder (HCC)    Carpal tunnel syndrome    COPD (chronic obstructive pulmonary disease) (HCC)    Depression    GERD (gastroesophageal reflux disease)    Hypertension    Nephrolithiasis    Type 2 diabetes mellitus (HCC)      Past Surgical History:  Procedure Laterality Date   CHOLECYSTECTOMY     TONSILLECTOMY      Family History  Problem Relation Age of Onset   Diabetes Mother    Hypertension Mother    Alzheimer's disease Father    Dementia Father    Ovarian cancer Other        aunt uncertain age    Social History   Tobacco Use   Smoking status: Former    Current packs/day: 0.00    Average packs/day: 0.3 packs/day for 30.0 years (7.5 ttl pk-yrs)    Types: Cigarettes    Start date: 08/12/1977    Quit date: 08/13/2007    Years since quitting: 16.2   Smokeless tobacco: Never   Tobacco comments:    boyfriend smokes  Vaping Use   Vaping status: Never Used  Substance Use Topics   Alcohol use: No    Comment: rarely   Drug use: No    Review of Systems  Constitutional:  Positive for fatigue. Negative for fever.  HENT:  Positive for congestion and rhinorrhea. Negative for sore throat.   Respiratory:  Positive for cough.   Gastrointestinal:  Negative for nausea and vomiting.     Objective:   Vitals: BP 138/84 (BP Location: Right Arm)   Pulse 71   Temp 98.3 F (36.8 C) (Oral)   Resp 18   LMP 07/15/2015 (Exact Date)   SpO2 94%   Physical Exam Constitutional:      General: She is not in acute distress.    Appearance: Normal appearance. She is well-developed. She is obese. She is not ill-appearing or toxic-appearing.  HENT:     Head: Normocephalic and atraumatic.   Cardiovascular:     Rate and Rhythm: Normal rate and regular rhythm.     Heart sounds: Normal heart sounds.  Pulmonary:     Effort: Pulmonary effort is normal.     Breath sounds: Normal breath sounds.     Comments: Clear to  auscultation bilaterally  Abdominal:     General: Bowel sounds are normal.     Palpations: Abdomen is soft.     Tenderness: There is no abdominal tenderness.   Skin:    General: Skin is warm and dry.   Neurological:     General: No focal deficit present.     Mental Status: She is alert.   Psychiatric:        Mood and Affect: Mood and affect normal.     Results:  Labs: No results found for this or any previous  visit (from the past 24 hours).  Radiology: No results found.   UC Course/Treatments:  Procedures: Procedures   Medications Ordered in UC: Medications  methylPREDNISolone  acetate (DEPO-MEDROL ) injection 40 mg (40 mg Intramuscular Given 11/24/23 1032)     Assessment and Plan :     ICD-10-CM   1. COPD exacerbation (HCC)  J44.1     2. Acute cough  R05.1      COPD exacerbation (HCC) Acute cough Afebrile, nontoxic-appearing, NAD. VSS. DDX includes but not limited WG:NFAOZ, flu, bronchitis, pneumonia, viral URI, post nasal drip, COPD, asthma, malignancy  COVID and flu not ordered given length of symptoms. Lungs were clear to auscultation on exam today. Patient reports not using the Combivent  inhaler since her last visit. She did not bring the inhaler today. I offered switching the patient to a daily inhaler that she would use twice a day, but patient agreed to trying the Combivent  inhaler first. An order was placed for a spacer and she was instructed to follow up with the pharmacist on how to use her inhaler with the spacer. Suspect medication management issue. Outpatient CXR was ordered for [facility] given lack of RT today in office. Finally, Doxycycline  100mg  BID was prescribed given ongoing symptoms for 3 weeks with little improvement. Methylprednisolone  40 mg IM was given today in office for continued exacerbation. Patient instructed to follow up with her PCP for more long term management of COPD. Strict ED precautions were given and patient verbalized  understanding.   ED Discharge Orders          Ordered    doxycycline  (VIBRAMYCIN ) 100 MG capsule  2 times daily        11/24/23 1029    DG Chest 2 View        11/24/23 1030    Spacer/Aero-[facility]olding [facility]        11/24/23 1036             PDMP not reviewed this encounter.     Mckyle Solanki P, PA-C 11/24/23 1102

## 2023-11-24 NOTE — ED Triage Notes (Signed)
 C/O fatigue, cough and states  I have been sick for three weeks Patient states she has COPD. Patient states she is using her inhalers a few times daily.

## 2023-11-28 ENCOUNTER — Other Ambulatory Visit: Payer: Self-pay

## 2023-11-28 DIAGNOSIS — E119 Type 2 diabetes mellitus without complications: Secondary | ICD-10-CM

## 2023-11-28 MED ORDER — METFORMIN HCL ER 500 MG PO TB24: ORAL_TABLET | ORAL | 5 refills | Status: AC

## 2023-12-05 ENCOUNTER — Encounter: Payer: Self-pay | Admitting: Family Medicine

## 2023-12-05 ENCOUNTER — Ambulatory Visit (INDEPENDENT_AMBULATORY_CARE_PROVIDER_SITE_OTHER): Admitting: Family Medicine

## 2023-12-05 VITALS — BP 128/71 | HR 78 | Ht 62.0 in | Wt 205.6 lb

## 2023-12-05 DIAGNOSIS — F430 Acute stress reaction: Secondary | ICD-10-CM

## 2023-12-05 DIAGNOSIS — J209 Acute bronchitis, unspecified: Secondary | ICD-10-CM | POA: Diagnosis not present

## 2023-12-05 DIAGNOSIS — J440 Chronic obstructive pulmonary disease with acute lower respiratory infection: Secondary | ICD-10-CM

## 2023-12-05 DIAGNOSIS — E1149 Type 2 diabetes mellitus with other diabetic neurological complication: Secondary | ICD-10-CM

## 2023-12-05 LAB — POCT GLYCOSYLATED HEMOGLOBIN (HGB A1C): HbA1c, POC (controlled diabetic range): 8.5 % — AB (ref 0.0–7.0)

## 2023-12-05 MED ORDER — UMECLIDINIUM-VILANTEROL 62.5-25 MCG/ACT IN AEPB: 1.0000 | INHALATION_SPRAY | Freq: Every day | RESPIRATORY_TRACT | 3 refills | Status: AC

## 2023-12-05 MED ORDER — OZEMPIC (2 MG/DOSE) 8 MG/3ML ~~LOC~~ SOPN: 2.0000 mg | PEN_INJECTOR | SUBCUTANEOUS | 3 refills | Status: DC
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
## 2023-12-05 NOTE — Patient Instructions (Signed)
 It was wonderful to see you today.  Please bring ALL of your medications with you to every visit.   Today we talked about:  COPD - Avoid tobacco smoke - I sent in an inhaler to use every single day---this prevents 'flares' of COPD - You need lung testing with Dr. Koval for your COPD - The inhaler is I sent in is called Anoro--you use EVERY SINGLE DAY regardless of how you feel  - Your diabetes number is higher than we like to see - I increased your Ozempic  to 2 mg  - Diabetes causes an increased risk of stroke, heart attack--this is why we really care about it!   We will check blood work today   Please follow up in 3 months   Thank you for choosing [facility].   Please call [phone] with any questions about today's appointment.  Please be sure to schedule follow up at the front  desk before you leave today.   Suzann Daring, MD  Family Medicine

## 2023-12-05 NOTE — Assessment & Plan Note (Signed)
 Advised on eye exam Discussed risks of uncontrolled DM in AVS UACR, lipids, CMP today  Intolerant of statins Increased ozempic  to 2 mg  Discusssed lifestyle changes Due for eye exam --she will call to schedule

## 2023-12-06 ENCOUNTER — Ambulatory Visit: Payer: Self-pay | Admitting: Family Medicine

## 2023-12-06 LAB — LIPID PANEL
Chol/HDL Ratio: 4.2 ratio (ref 0.0–4.4)
Cholesterol, Total: 236 mg/dL — ABNORMAL HIGH (ref 100–199)
HDL: 56 mg/dL (ref >39)
LDL Chol Calc (NIH): 135 mg/dL — ABNORMAL HIGH (ref 0–99)
Triglycerides: 254 mg/dL — ABNORMAL HIGH (ref 0–149)
VLDL Cholesterol Cal: 45 mg/dL — ABNORMAL HIGH (ref 5–40)

## 2023-12-06 LAB — COMPREHENSIVE METABOLIC PANEL WITH GFR
ALT: 13 IU/L (ref 0–32)
AST: 7 IU/L (ref 0–40)
Albumin: 4 g/dL (ref 3.9–4.9)
Alkaline Phosphatase: 81 IU/L (ref 44–121)
BUN/Creatinine Ratio: 19 (ref 12–28)
BUN: 22 mg/dL (ref 8–27)
Bilirubin Total: 0.5 mg/dL (ref 0.0–1.2)
CO2: 20 mmol/L (ref 20–29)
Calcium: 8.2 mg/dL — ABNORMAL LOW (ref 8.7–10.3)
Chloride: 95 mmol/L — ABNORMAL LOW (ref 96–106)
Creatinine, Ser: 1.15 mg/dL — ABNORMAL HIGH (ref 0.57–1.00)
Globulin, Total: 2.2 g/dL (ref 1.5–4.5)
Glucose: 224 mg/dL — ABNORMAL HIGH (ref 70–99)
Potassium: 4.1 mmol/L (ref 3.5–5.2)
Sodium: 135 mmol/L (ref 134–144)
Total Protein: 6.2 g/dL (ref 6.0–8.5)
eGFR: 54 mL/min/{1.73_m2} — ABNORMAL LOW (ref >59)

## 2023-12-06 LAB — MICROALBUMIN / CREATININE URINE RATIO
Creatinine, Urine: 231.2 mg/dL
Microalb/Creat Ratio: 29 mg/g{creat} (ref 0–29)
Microalbumin, Urine: 67 ug/mL

## 2023-12-06 MED ORDER — PRAVASTATIN SODIUM 20 MG PO TABS: 20.0000 mg | ORAL_TABLET | Freq: Every day | ORAL | 3 refills | Status: AC

## 2023-12-06 NOTE — Telephone Encounter (Signed)
 Called patient. Highly recommended statin. Rx pravastatin after lengthy discussion.  Suzann Daring, MD  Family Medicine Teaching Service

## 2023-12-20 ENCOUNTER — Ambulatory Visit: Admitting: Pharmacist

## 2023-12-20 ENCOUNTER — Encounter: Payer: Self-pay | Admitting: Pharmacist

## 2023-12-20 VITALS — Ht 62.5 in | Wt 205.0 lb

## 2023-12-20 DIAGNOSIS — R0602 Shortness of breath: Secondary | ICD-10-CM | POA: Insufficient documentation

## 2023-12-20 NOTE — Patient Instructions (Signed)
 It was nice to see you today!  Your lung function test showed near normal results today.  Please continue your efforts to improve your breathing by walking.   Medication Changes:  None Continue all other medication the same.   Keep up the good work with diet and exercise. Aim for a diet full of vegetables, fruit and lean meats (chicken, [place], fish). Try to limit salt intake by eating fresh or frozen vegetables (instead of canned), rinse canned vegetables prior to cooking and do not add any additional salt to meals.

## 2023-12-20 NOTE — Progress Notes (Signed)
   S:       Chief Complaint  Patient presents with   Medication Management    PFT - Spirometry    63 y.o. female who presents for diabetes evaluation, education, and management. Patient arrives in good spirits and presents without any assistance.   Patient was referred and last seen by Primary Care Provider, Dr. Delores, on 12/05/2023.  At last visit, history of breathing issues was discussed.  Now taking inhalers.  PFT to determine lung function planned.    Patient reports breathing has been symptomatic with shortness of breath for a while.   Medication adherence reported as good - takes Anoro Ellipta  daily.  Has not taken combivent  respimat - requests instruction of use.  Patient reports last dose of COPD medications was this morning - Anoro Patient exacerbation hx: multiple in early - mid June  O: Review of Systems  Respiratory:  Positive for cough (diminished since last visit), sputum production (diminished since last visit) and shortness of breath (with significant exersion). Negative for hemoptysis and wheezing.   All other systems reviewed and are negative.   Physical Exam Vitals reviewed.  Constitutional:      Appearance: Normal appearance.  Pulmonary:     Effort: Pulmonary effort is normal.  Neurological:     Mental Status: She is alert.  Psychiatric:        Mood and Affect: Mood normal.        Thought Content: Thought content normal.   mMRC score= 2  See scanned report or Documentation Flowsheet (discrete results - PFTs) for Spirometry results. Patient provided good effort while attempting spirometry.   Lung Age = 42  Patient is participating in a Managed Medicaid Plan:  Yes   A/P: Patient has been experiencing repeated bronchitis exacerbations, concerning for COPD in this patient with > 20 years of smoking history in the distant past.  Spirometry evaluation without bronchodilator (patient took Anoro Ellipta  this AM and is likely bronchodilated upon arrival)  reveals near normal (~ 80% predicted) lung function. Lung age of 25 - Patient reeducated on use of RESPIMAT  inhaler technique. -Educated patient on purpose, proper use, potential adverse effects.  - Reviewed results of pulmonary function tests.   - Pt verbalized understanding of results and education.    Written patient instructions provided.   Total time in face to face counseling 32 minutes.    Follow-up:  Pharmacist TBD PCP clinic visit - patient will schedule

## 2023-12-20 NOTE — Assessment & Plan Note (Signed)
 Patient has been experiencing repeated bronchitis exacerbations, concerning for COPD in this patient with > 20 years of smoking history in the distant past.  Spirometry evaluation without bronchodilator (patient took Anoro Ellipta  this AM and is likely bronchodilated upon arrival) reveals near normal (~ 80% predicted) lung function. Lung age of 61 - Patient reeducated on use of RESPIMAT  inhaler technique. -Educated patient on purpose, proper use, potential adverse effects.  - Reviewed results of pulmonary function tests.   - Pt verbalized understanding of results and education.

## 2023-12-22 NOTE — Progress Notes (Signed)
 Reviewed and agree with Dr Macky Lower plan.

## 2023-12-23 ENCOUNTER — Encounter: Payer: Self-pay | Admitting: Advanced Practice Midwife

## 2024-01-09 ENCOUNTER — Telehealth: Payer: Self-pay | Admitting: Family Medicine

## 2024-01-09 DIAGNOSIS — G629 Polyneuropathy, unspecified: Secondary | ICD-10-CM

## 2024-01-09 MED ORDER — GABAPENTIN 300 MG PO CAPS: 600.0000 mg | ORAL_CAPSULE | Freq: Every day | ORAL | 3 refills | Status: AC

## 2024-01-09 NOTE — Telephone Encounter (Signed)
 Refilled gabapentin

## 2024-01-21 ENCOUNTER — Ambulatory Visit
Admission: EM | Admit: 2024-01-21 | Discharge: 2024-01-21 | Disposition: A | Discharge status: Home / Self Care | Attending: Emergency Medicine | Admitting: Emergency Medicine

## 2024-01-21 DIAGNOSIS — J309 Allergic rhinitis, unspecified: Secondary | ICD-10-CM | POA: Diagnosis not present

## 2024-01-21 DIAGNOSIS — B372 Candidiasis of skin and nail: Secondary | ICD-10-CM

## 2024-01-21 MED ORDER — FLUTICASONE PROPIONATE 50 MCG/ACT NA SUSP: 1.0000 | Freq: Every day | NASAL | 2 refills | Status: DC

## 2024-01-21 MED ORDER — KETOCONAZOLE 2 % EX CREA: TOPICAL_CREAM | CUTANEOUS | 1 refills | Status: AC

## 2024-01-21 MED ORDER — CETIRIZINE HCL 10 MG PO TABS: 10.0000 mg | ORAL_TABLET | Freq: Every day | ORAL | 2 refills | Status: DC

## 2024-01-21 NOTE — Discharge Instructions (Signed)
 Please read below to learn more about the medications, dosages and frequencies that I recommend to help alleviate your symptoms and to get you feeling better soon:  Zyrtec  (cetirizine ): This is an excellent second-generation antihistamine that helps to reduce respiratory inflammatory response to environmental allergens.  In some patients, this medication can cause daytime sleepiness so I recommend that you take 1 tablet daily at bedtime.     Flonase  (fluticasone ): This is a steroid nasal spray that used once daily, 1 spray in each nare.  This works best when used on a daily basis. This medication does not work well if it is only used when you think you need it.  After 3 to 5 days of use, you will notice significant reduction of the inflammation and mucus production that is currently being caused by exposure to allergens, whether seasonal or environmental.  The most common side effect of this medication is nosebleeds.  If you experience a nosebleed, please discontinue use for 1 week, then feel free to resume.  If you find that your insurance will not pay for this medication, please consider a different nasal steroids such as Nasonex (mometasone), or Nasacort  (triamcinolone ).   Topical Kenalog  (triamcinolone ): This is a topical steroid that can be applied to the affected area of the skin twice daily.  Please use it exactly as directed by the prescription instructions.  Different strengths of topical steroids are used to treat different areas of the body so it is important that this steroid is only applied to the areas for which it has been prescribed.  Unnecessary exposure to topical steroids can cause bleaching of the skin which can be permanent so please immediately discontinue use of this topical steroid once the skin condition is resolved.   If symptoms have not meaningfully improved in the next 5 to 7 days, please return for repeat evaluation or follow-up with your regular provider.  If symptoms have  worsened in the next 3 to 5 days, please return for repeat evaluation or follow-up with your regular provider.    Thank you for visiting urgent care today.  We appreciate the opportunity to participate in your care.

## 2024-01-21 NOTE — ED Provider Notes (Signed)
 [facility]    CSN: [id number] Arrival date & time: 01/21/24  1543    HISTORY   Chief Complaint  Patient presents with   Rash   Nasal Congestion   HPI Carmen [name] is a pleasant, 63 y.o. female who presents to urgent care today. Patient complains of one week, history of headache, nonrelative, cough, loss of appetite, nasal congestion, runny nose, and watery eyes. Patient say she's been taking a dollar store called an allergy medication that contains chlorpheniramine.  Patient states this is not helping.  Patient also complains of a red, itchy rash in the area of her groin similar to a rash that she frequently gets beneath her breasts and in the skin folds of her belly.  Patient states she has been applying A&D ointment and Vaseline without relief.  Finally, patient complains of a several week history of constant fatigue.  Per my observation, patient is pleasant, smiling and alert during her visit today.  Patient denies fever, body aches, chills, nausea, vomiting, diarrhea, shortness of breath, sore throat, loss of taste or smell.  The history is provided by the patient.  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 Authorizing Provider  amLODipine  (NORVASC ) 10 MG tablet Take 1 tablet (10 mg total) by mouth at bedtime. 03/18/23   Delores Suzann HERO, MD  carvedilol  (COREG ) 25 MG tablet Take 1 tablet (25 mg total) by mouth 2 (two) times daily with a meal. 01/13/23   Delores Suzann HERO, MD  escitalopram  (LEXAPRO ) 20 MG tablet Take 1 tablet (20 mg total) by mouth daily. 11/02/23   McDiarmid, Krystal BIRCH, MD  esomeprazole  (NEXIUM ) 10 MG packet Take 10 mg by mouth daily before breakfast.    [provider]  gabapentin  (NEURONTIN ) 300 MG capsule Take 2 capsules (600 mg total) by mouth at bedtime. 01/09/24   Delores Suzann HERO, MD  hydrochlorothiazide  (HYDRODIURIL ) 12.5 MG tablet Take 1 tablet (12.5 mg total) by mouth daily. 01/13/23  Delores Suzann HERO, MD  Insulin Pen Needle (BD PEN NEEDLE MICRO U/F) 32G X 6 MM MISC Use to inject ozempic  09/14/21   Delores Suzann HERO, MD  ipratropium (ATROVENT ) 0.06 % nasal spray Place 2 sprays into both nostrils 3 (three) times daily. As needed for nasal congestion, runny nose Patient not taking: Reported on 12/20/2023 11/16/23   Joesph Shaver Scales, PA-C  Ipratropium-Albuterol  (COMBIVENT  RESPIMAT) 20-100 MCG/ACT AERS respimat Inhale 1 puff into the lungs every 6 (six) hours. Patient not taking: Reported on 12/20/2023 11/16/23 02/14/24  Joesph Shaver Scales, PA-C  ipratropium-albuterol  (DUONEB) 0.5-2.5 (3) MG/3ML SOLN Take 3 mLs by nebulization every 6 (six) hours as needed. Patient not taking: Reported on 12/20/2023 11/16/23 02/14/24  Joesph Shaver Scales, PA-C  losartan  (COZAAR ) 100 MG tablet Take 1  tablet (100 mg total) by mouth at bedtime. 04/11/23   Delores Suzann HERO, MD  metFORMIN  (GLUCOPHAGE -XR) 500 MG 24 hr tablet TAKE 2 TABLETS BY MOUTH TWICE DAILY 11/28/23   Delores Suzann HERO, MD  pravastatin  (PRAVACHOL ) 20 MG tablet Take 1 tablet (20 mg total) by mouth at bedtime. 12/06/23   Delores Suzann HERO, MD  promethazine -dextromethorphan (PROMETHAZINE -DM) 6.25-15 MG/5ML syrup Take 5 mLs by mouth at bedtime as needed for cough. 11/16/23   Joesph Shaver Scales, PA-C  Semaglutide , 2 MG/DOSE, (OZEMPIC , 2 MG/DOSE,) 8 MG/3ML SOPN Inject 2 mg into the skin once a week. Patient taking differently: Inject 2 mg into the skin once a week. 12/05/23   Delores Suzann HERO, MD  Spacer/Aero-Holding Raguel FRENCH Use with you inhaler Patient not taking: Reported on 12/20/2023 11/24/23   Hermanns, Ashlee P, PA-C  umeclidinium-vilanterol (ANORO ELLIPTA ) 62.5-25 MCG/ACT AEPB Inhale 1 puff into the lungs daily. 12/05/23   Delores Suzann HERO, MD    Family History Family History  Problem Relation Age of Onset   Diabetes Mother    Hypertension Mother    Alzheimer's disease Father    Dementia Father    Ovarian cancer Other        aunt uncertain age   Social History Social History   Tobacco Use   Smoking status: Former    Current packs/day: 0.00    Average packs/day: 0.3 packs/day for 30.0 years (7.5 ttl pk-yrs)    Types: Cigarettes    Start date: 08/12/1977    Quit date: 08/13/2007    Years since quitting: 16.4   Smokeless tobacco: Never   Tobacco comments:    boyfriend smokes  Vaping Use   Vaping status: Never Used  Substance Use Topics   Alcohol use: No    Comment: rarely   Drug use: No   Allergies   Ace inhibitors and Metformin  and related  Review of Systems Review of Systems  Skin:  Positive for rash.   Pertinent findings revealed after performing a 14 point review of systems has been noted in the history of present illness.  Physical Exam Vital Signs BP 122/80 (BP Location: Right Arm)   Pulse 87   Temp  98.3 F (36.8 C) (Oral)   Resp 18   LMP 07/15/2015 (Exact Date)   SpO2 97%   No data found.  Physical Exam Vitals and nursing note reviewed.  Constitutional:      General: She is not in acute distress.    Appearance: Normal appearance. She is not ill-appearing.  HENT:     Head: Normocephalic and atraumatic.     Salivary Glands: Right salivary gland is not diffusely enlarged or tender. Left salivary gland is  not diffusely enlarged or tender.     Right Ear: Ear canal and external ear normal. No drainage. A middle ear effusion is present. There is no impacted cerumen. Tympanic membrane is bulging. Tympanic membrane is not injected or erythematous.     Left Ear: Ear canal and external ear normal. No drainage. A middle ear effusion is present. There is no impacted cerumen. Tympanic membrane is bulging. Tympanic membrane is not injected or erythematous.     Ears:     Comments: Bilateral EACs normal, both TMs bulging with clear fluid    Nose: Rhinorrhea present. No nasal deformity, septal deviation, signs of injury, nasal tenderness, mucosal edema or congestion. Rhinorrhea is clear.     Right Nostril: Occlusion present. No foreign body, epistaxis or septal hematoma.     Left Nostril: Occlusion present. No foreign body, epistaxis or septal hematoma.     Right Turbinates: Enlarged, swollen and pale.     Left Turbinates: Enlarged, swollen and pale.     Right Sinus: No maxillary sinus tenderness or frontal sinus tenderness.     Left Sinus: No maxillary sinus tenderness or frontal sinus tenderness.     Mouth/Throat:     Lips: Pink. No lesions.     Mouth: Mucous membranes are moist. No oral lesions.     Pharynx: Oropharynx is clear. Uvula midline. No posterior oropharyngeal erythema or uvula swelling.     Tonsils: No tonsillar exudate. 0 on the right. 0 on the left.     Comments: Postnasal drip Eyes:     General: Lids are normal.        Right eye: No discharge.        Left eye: No discharge.      Extraocular Movements: Extraocular movements intact.     Conjunctiva/sclera: Conjunctivae normal.     Right eye: Right conjunctiva is not injected.     Left eye: Left conjunctiva is not injected.  Neck:     Trachea: Trachea and phonation normal.  Cardiovascular:     Rate and Rhythm: Normal rate and regular rhythm.     Pulses: Normal pulses.     Heart sounds: Normal heart sounds. No murmur heard.    No friction rub. No gallop.  Pulmonary:     Effort: Pulmonary effort is normal. No accessory muscle usage, prolonged expiration or respiratory distress.     Breath sounds: Normal breath sounds. No stridor, decreased air movement or transmitted upper airway sounds. No decreased breath sounds, wheezing, rhonchi or rales.  Chest:     Chest wall: No tenderness.  Musculoskeletal:        General: Normal range of motion.     Cervical back: Normal range of motion and neck supple. Normal range of motion.  Lymphadenopathy:     Cervical: No cervical adenopathy.  Skin:    General: Skin is warm and dry.     Findings: Erythema and rash present.     Comments: Well-demarcated area of erythema at right groin without macules, papules, vesicles, signs of excoriation, drainage or warmth to touch concerning for candidiasis of the skin without  Neurological:     General: No focal deficit present.     Mental Status: She is alert and oriented to person, place, and time.  Psychiatric:        Mood and Affect: Mood normal.        Behavior: Behavior normal.     Visual Acuity Right Eye Distance:   Left Eye Distance:  Bilateral Distance:    Right Eye Near:   Left Eye Near:    Bilateral Near:     UC Couse / Diagnostics / Procedures:     Radiology No results found.  Procedures Procedures (including critical care time) EKG  Pending results:  Labs Reviewed - No data to display  Medications Ordered in UC: Medications - No data to display  UC Diagnoses / Final Clinical Impressions(s)   I have  reviewed the triage vital signs and the nursing notes.  Pertinent labs & imaging results that were available during my care of the patient were reviewed by me and considered in my medical decision making (see chart for details).    Final diagnoses:  Allergic rhinitis, unspecified seasonality, unspecified trigger  Candidiasis of skin   Patient advised to resume daily second-generation and histamine and nasal steroid spray, prescription sent to pharmacy.  Patient provided with topical ketoconazole  for treatment of candidiasis of the skin.  Patient advised to follow-up with her PCP regarding chronic fatigue.  Please see discharge instructions below for details of plan of care as provided to patient. ED Prescriptions     Medication Sig Dispense Auth. Provider   cetirizine  (ZYRTEC  ALLERGY) 10 MG tablet Take 1 tablet (10 mg total) by mouth at bedtime. 30 tablet Joesph Shaver Scales, PA-C   fluticasone  (FLONASE ) 50 MCG/ACT nasal spray Place 1 spray into both nostrils daily. Begin by using 2 sprays in each nare daily for 3 to 5 days, then decrease to 1 spray in each nare daily. 15.8 mL Joesph Shaver Scales, PA-C   ketoconazole  (NIZORAL ) 2 % cream Apply to affected areas and the 1 inch area surrounding the affected areas once daily for at least 14 days.  Once rash is completely resolved, continue applying to previously affected area for another 7 days to ensure complete resolution. 60 g Joesph Shaver Scales, PA-C      PDMP not reviewed this encounter.  Pending results:  Labs Reviewed - No data to display    Discharge Instructions      Please read below to learn more about the medications, dosages and frequencies that I recommend to help alleviate your symptoms and to get you feeling better soon:  Zyrtec  (cetirizine ): This is an excellent second-generation antihistamine that helps to reduce respiratory inflammatory response to environmental allergens.  In some patients, this medication  can cause daytime sleepiness so I recommend that you take 1 tablet daily at bedtime.     Flonase  (fluticasone ): This is a steroid nasal spray that used once daily, 1 spray in each nare.  This works best when used on a daily basis. This medication does not work well if it is only used when you think you need it.  After 3 to 5 days of use, you will notice significant reduction of the inflammation and mucus production that is currently being caused by exposure to allergens, whether seasonal or environmental.  The most common side effect of this medication is nosebleeds.  If you experience a nosebleed, please discontinue use for 1 week, then feel free to resume.  If you find that your insurance will not pay for this medication, please consider a different nasal steroids such as Nasonex (mometasone), or Nasacort  (triamcinolone ).   Topical Kenalog  (triamcinolone ): This is a topical steroid that can be applied to the affected area of the skin twice daily.  Please use it exactly as directed by the prescription instructions.  Different strengths of topical steroids are used to treat  different areas of the body so it is important that this steroid is only applied to the areas for which it has been prescribed.  Unnecessary exposure to topical steroids can cause bleaching of the skin which can be permanent so please immediately discontinue use of this topical steroid once the skin condition is resolved.   If symptoms have not meaningfully improved in the next 5 to 7 days, please return for repeat evaluation or follow-up with your regular provider.  If symptoms have worsened in the next 3 to 5 days, please return for repeat evaluation or follow-up with your regular provider.    Thank you for visiting urgent care today.  We appreciate the opportunity to participate in your care.      Disposition Upon Discharge:  Condition: stable for discharge home  Patient presented with an acute illness with associated systemic  symptoms and significant discomfort requiring urgent management. In my opinion, this is a condition that a prudent lay person (someone who possesses an average knowledge of health and medicine) may potentially expect to result in complications if not addressed urgently such as respiratory distress, impairment of bodily function or dysfunction of bodily organs.   Routine symptom specific, illness specific and/or disease specific instructions were discussed with the patient and/or caregiver at length.   As such, the patient has been evaluated and assessed, work-up was performed and treatment was provided in alignment with urgent care protocols and evidence based medicine.  Patient/parent/caregiver has been advised that the patient may require follow up for further testing and treatment if the symptoms continue in spite of treatment, as clinically indicated and appropriate.  Patient/parent/caregiver has been advised to return to the [facility] or PCP if no better; to PCP or the Emergency Department if new signs and symptoms develop, or if the current signs or symptoms continue to change or worsen for further workup, evaluation and treatment as clinically indicated and appropriate  The patient will follow up with their current PCP if and as advised. If the patient does not currently have a PCP we will assist them in obtaining one.   The patient may need specialty follow up if the symptoms continue, in spite of conservative treatment and management, for further workup, evaluation, consultation and treatment as clinically indicated and appropriate.  Patient/parent/caregiver verbalized understanding and agreement of plan as discussed.  All questions were addressed during visit.  Please see discharge instructions below for further details of plan.  This office note has been dictated using Teaching laboratory technician.  Unfortunately, this method of dictation can sometimes lead to typographical or grammatical  errors.  I apologize for your inconvenience in advance if this occurs.  Please do not hesitate to reach out to me if clarification is needed.      Joesph Shaver Scales, PA-C 01/23/24 1439

## 2024-01-21 NOTE — ED Triage Notes (Addendum)
 Pt states for a week she has been having a headache, cough, loss of appetite, nasal congestion and  watery eyes. She has tried Maximum Strength Cold and Allergy with some relief.   Pt states for about a week near the edge of right groin she has irritation and itching. She has tried AD ointment and Vaseline.   Pt states she has had constant fatigue for the past few weeks.

## 2024-01-24 ENCOUNTER — Other Ambulatory Visit: Payer: Self-pay | Admitting: *Deleted

## 2024-01-24 MED ORDER — HYDROCHLOROTHIAZIDE 12.5 MG PO TABS: 12.5000 mg | ORAL_TABLET | Freq: Every day | ORAL | 3 refills | Status: AC

## 2024-02-02 ENCOUNTER — Other Ambulatory Visit: Payer: Self-pay

## 2024-02-02 ENCOUNTER — Ambulatory Visit: Admission: EM | Admit: 2024-02-02 | Discharge: 2024-02-02 | Disposition: A | Discharge status: Home / Self Care

## 2024-02-02 DIAGNOSIS — Z9181 History of falling: Secondary | ICD-10-CM | POA: Diagnosis not present

## 2024-02-02 DIAGNOSIS — R519 Headache, unspecified: Secondary | ICD-10-CM

## 2024-02-02 DIAGNOSIS — S0990XA Unspecified injury of head, initial encounter: Secondary | ICD-10-CM | POA: Diagnosis not present

## 2024-02-02 NOTE — ED Provider Notes (Signed)
 [facility]  Note:  This document was prepared using Dragon voice recognition software and may include unintentional dictation errors.  MRN: [id number] DOB: 09-Jul-1960 DATE: 02/02/24   Subjective:  Chief Complaint:  Chief Complaint  Patient presents with   Fall     HPI: Carmen [name] is a 63 y.o. female presenting for headache after fall yesterday. Patient states she was playing mini golf here in [place] when she tripped and fell. Patient states she was at hole 14 and was walking up a slope to hit her ball in the hole when she tripped over her foot. She states she landed with her left side of her face hitting the edge of the course. She reports losing her glasses. She states she had to pull herself up on the side of the metal fence. Denies LOC. She reports no bleeding or laceration her head. She does reports a headache and soreness to the side of her face later that evening. She reports taking Advil  with resolution. Reports some aching today. Denies fever, nausea/vomiting, vision changes, loss of vision, LOC, laceration. Endorses headache. Presents NAD.  Prior to Admission medications   Medication Sig Start Date End Date Taking? Authorizing Provider  amLODipine  (NORVASC ) 10 MG tablet Take 1 tablet (10 mg total) by mouth at bedtime. 03/18/23   Delores Suzann HERO, MD  carvedilol  (COREG ) 25 MG tablet Take 1 tablet (25 mg total) by mouth 2 (two) times daily with a meal. 01/13/23   Delores Suzann HERO, MD  cetirizine  (ZYRTEC  ALLERGY) 10 MG tablet Take 1 tablet (10 mg total) by mouth at bedtime. 01/21/24 04/20/24  Joesph Shaver Scales, PA-C  escitalopram  (LEXAPRO ) 20 MG tablet Take 1 tablet (20 mg total) by mouth daily. 11/02/23   McDiarmid, Krystal BIRCH, MD  esomeprazole  (NEXIUM ) 10 MG packet Take 10 mg by mouth daily before breakfast.    [provider]  fluticasone  (FLONASE ) 50 MCG/ACT nasal spray Place 1 spray into both nostrils daily. Begin by using 2 sprays in each nare daily for 3 to  5 days, then decrease to 1 spray in each nare daily. 01/21/24   Joesph Shaver Scales, PA-C  gabapentin  (NEURONTIN ) 300 MG capsule Take 2 capsules (600 mg total) by mouth at bedtime. 01/09/24   Delores Suzann HERO, MD  hydrochlorothiazide  (HYDRODIURIL ) 12.5 MG tablet Take 1 tablet (12.5 mg total) by mouth daily. 01/24/24   Delores Suzann HERO, MD  Insulin Pen Needle (BD PEN NEEDLE MICRO U/F) 32G X 6 MM MISC Use to inject ozempic  09/14/21   Delores Suzann HERO, MD  ipratropium (ATROVENT ) 0.06 % nasal spray Place 2 sprays into both nostrils 3 (three) times daily. As needed for nasal congestion, runny nose Patient not taking: Reported on 12/20/2023 11/16/23   Joesph Shaver Scales, PA-C  Ipratropium-Albuterol  (COMBIVENT  RESPIMAT) 20-100 MCG/ACT AERS respimat Inhale 1 puff into the lungs every 6 (six) hours. Patient not taking: Reported on 12/20/2023 11/16/23 02/14/24  Joesph Shaver Scales, PA-C  ipratropium-albuterol  (DUONEB) 0.5-2.5 (3) MG/3ML SOLN Take 3 mLs by nebulization every 6 (six) hours as needed. Patient not taking: Reported on 12/20/2023 11/16/23 02/14/24  Joesph Shaver Scales, PA-C  ketoconazole  (NIZORAL ) 2 % cream Apply to affected areas and the 1 inch area surrounding the affected areas once daily for at least 14 days.  Once rash is completely resolved, continue applying to previously affected area for another 7 days to ensure complete resolution. 01/21/24   Joesph Shaver Scales, PA-C  losartan  (COZAAR ) 100 MG tablet Take 1 tablet (  100 mg total) by mouth at bedtime. 04/11/23   Delores Suzann HERO, MD  metFORMIN  (GLUCOPHAGE -XR) 500 MG 24 hr tablet TAKE 2 TABLETS BY MOUTH TWICE DAILY 11/28/23   Delores Suzann HERO, MD  pravastatin  (PRAVACHOL ) 20 MG tablet Take 1 tablet (20 mg total) by mouth at bedtime. 12/06/23   Delores Suzann HERO, MD  Semaglutide , 2 MG/DOSE, (OZEMPIC , 2 MG/DOSE,) 8 MG/3ML SOPN Inject 2 mg into the skin once a week. Patient taking differently: Inject 2 mg into the skin once a week. 12/05/23   Delores Suzann HERO,  MD  Spacer/Aero-Holding Raguel FRENCH Use with you inhaler Patient not taking: Reported on 12/20/2023 11/24/23   Johnatha Zeidman P, PA-C  umeclidinium-vilanterol (ANORO ELLIPTA ) 62.5-25 MCG/ACT AEPB Inhale 1 puff into the lungs daily. 12/05/23   Delores Suzann HERO, MD     Allergies  Allergen Reactions   Ace Inhibitors Cough    Other reaction(s): Cough (ALLERGY/intolerance)   Metformin  And Related Diarrhea    Able to tolerate XR with only mild symptoms     History:   Past Medical History:  Diagnosis Date   Adhesive capsulitis of shoulder    Allergy    Borderline personality disorder (HCC)    Carpal tunnel syndrome    COPD (chronic obstructive pulmonary disease) (HCC)    Depression    GERD (gastroesophageal reflux disease)    Hypertension    Nephrolithiasis    Type 2 diabetes mellitus (HCC)      Past Surgical History:  Procedure Laterality Date   CHOLECYSTECTOMY     TONSILLECTOMY      Family History  Problem Relation Age of Onset   Diabetes Mother    Hypertension Mother    Alzheimer's disease Father    Dementia Father    Ovarian cancer Other        aunt uncertain age    Social History   Tobacco Use   Smoking status: Former    Current packs/day: 0.00    Average packs/day: 0.3 packs/day for 30.0 years (7.5 ttl pk-yrs)    Types: Cigarettes    Start date: 08/12/1977    Quit date: 08/13/2007    Years since quitting: 16.4   Smokeless tobacco: Never   Tobacco comments:    boyfriend smokes  Vaping Use   Vaping status: Never Used  Substance Use Topics   Alcohol use: No    Comment: rarely   Drug use: No    Review of Systems  Constitutional:  Negative for fever.  HENT:  Negative for dental problem.   Eyes:  Negative for photophobia and visual disturbance.  Gastrointestinal:  Negative for nausea and vomiting.  Skin:  Negative for wound.  Neurological:  Positive for headaches. Negative for syncope.     Objective:   Vitals: BP 124/77 (BP Location: Right Arm)    Pulse 90   Resp 18   LMP 07/15/2015 (Exact Date)   SpO2 98%   Physical Exam Constitutional:      General: She is not in acute distress.    Appearance: Normal appearance. She is well-developed. She is morbidly obese. She is not ill-appearing or toxic-appearing.     Comments: Patient laughing throughout the exam  HENT:     Head: Normocephalic and atraumatic. No laceration.     Jaw: No tenderness or pain on movement.  Eyes:     Extraocular Movements: Extraocular movements intact.     Conjunctiva/sclera: Conjunctivae normal.     Pupils: Pupils are equal, round, and reactive  to light.  Cardiovascular:     Rate and Rhythm: Normal rate and regular rhythm.     Heart sounds: Normal heart sounds.  Pulmonary:     Effort: Pulmonary effort is normal.     Breath sounds: Normal breath sounds.     Comments: Clear to auscultation bilaterally  Abdominal:     General: Bowel sounds are normal.     Palpations: Abdomen is soft.     Tenderness: There is no abdominal tenderness.  Musculoskeletal:     Cervical back: Normal.  Skin:    General: Skin is warm and dry.     Findings: No abrasion, bruising, signs of injury or wound.  Neurological:     General: No focal deficit present.     Mental Status: She is alert.     GCS: GCS eye subscore is 4. GCS verbal subscore is 5. GCS motor subscore is 6.     Cranial Nerves: Cranial nerves 2-12 are intact.  Psychiatric:        Mood and Affect: Mood and affect normal.     Results:  Labs: No results found for this or any previous visit (from the past 24 hours).  Radiology: No results found.   UC Course/Treatments:  Procedures: Procedures   Medications Ordered in UC: Medications - No data to display   Assessment and Plan :     ICD-10-CM   1. Acute nonintractable headache, unspecified headache type  R51.9     2. History of fall  Z91.81     3. Injury of head, initial encounter  S09.90XA       Acute nonintractable headache, unspecified  headache type History of fall Injury of head, initial encounter Afebrile, nontoxic-appearing, NAD. VSS. DDX includes but not limited to: contusion, fracture, intracranial hemorrhage, TBI, concussion, abrasion Patient reports falling onto her face yesterday. No laceration, abrasions, or bruising noted on exam. Reports headache yesterday that resolved with Advil . She reports some aching today. Neuro exam unremarkable. Patient active and laughing throughout the exam. Recommend OTC analgesics as needed for pain. Strict ED precautions were given and patient verbalized understanding.   ED Discharge Orders     None        I have reviewed the PDMP during this encounter.      Dillyn Joaquin P, PA-C 02/02/24 1601

## 2024-02-02 NOTE — ED Triage Notes (Signed)
 Patient states she tripped and fell at the putt putt golf yesterday and struck her head on a cement barrier. Denies loss of consciousness. Patient states she had a headache and took advil  with relief.

## 2024-02-02 NOTE — Discharge Instructions (Signed)
  Please seek prompt medical care if: You have: A very bad (severe) headache that is not helped by medicine. Trouble walking or weakness in your arms and legs. Clear or bloody fluid coming from your nose or ears. Changes in your seeing (vision). Jerky movements that you cannot control (seizure). You throw up (vomit). Your symptoms get worse. You lose balance. Your speech is slurred. You pass out. You are sleepier and have trouble staying awake. The black centers of your eyes (pupils) change in size.  These symptoms may be an emergency. Do not wait to see if the symptoms will go away. Get medical help right away. Call your local emergency services. Do not drive yourself to the hospital.

## 2024-02-02 NOTE — ED Triage Notes (Signed)
 Patient states she tripped and fell yesterday at the putt putt golf and struck her head on a cement barrier. Patient states she had a headache and took advil  with relief. Denies loss of consciousness.

## 2024-02-03 ENCOUNTER — Telehealth: Payer: Self-pay

## 2024-02-03 NOTE — Telephone Encounter (Signed)
 Patient called to follow up from their prior visit. Patient was left a voicemail to return the phone call back to our clinic.

## 2024-02-21 ENCOUNTER — Ambulatory Visit
Admission: EM | Admit: 2024-02-21 | Discharge: 2024-02-21 | Disposition: A | Discharge status: Home / Self Care | Attending: Internal Medicine | Admitting: Internal Medicine

## 2024-02-21 ENCOUNTER — Other Ambulatory Visit: Payer: Self-pay

## 2024-02-21 DIAGNOSIS — N898 Other specified noninflammatory disorders of vagina: Secondary | ICD-10-CM | POA: Diagnosis present

## 2024-02-21 DIAGNOSIS — R102 Pelvic and perineal pain: Secondary | ICD-10-CM | POA: Diagnosis not present

## 2024-02-21 MED ORDER — FLUCONAZOLE 150 MG PO TABS: 150.0000 mg | ORAL_TABLET | ORAL | 0 refills | Status: AC

## 2024-02-21 NOTE — Discharge Instructions (Addendum)
 Your swab was sent to the lab for further testing.  You will be called with results. Diflucan  is a prescription given to treat yeast infections. Take the prescription as directed.    If symptoms continue, recommend you follow up with your GYN.

## 2024-02-21 NOTE — ED Triage Notes (Signed)
 C/O vaginal pain x 2 days. Patient denies discharge.Denies injury, patient states pain at the entrance to the vagina

## 2024-02-21 NOTE — ED Provider Notes (Signed)
 [facility]  Note:  This document was prepared using Dragon voice recognition software and may include unintentional dictation errors.  MRN: [id number] DOB: 18-Mar-1961 DATE: 02/21/24   Subjective:  Chief Complaint:  Chief Complaint  Patient presents with   Vaginal Discharge     HPI: Carmen [name] is a 63 y.o. female presenting for vaginal pain for the past 2 days. Patient states she started with vaginal pain at the entrance of her vagina yesterday. She reports feeling like she has paper cut to the area. She states she was in the bath yesterday and touched the area. She states it was very tender when she touched the area. She has not noticed any lesions to the area. She reports pain at the area with wiping today as well. She has not taken anything for her symptoms. Denies fever, nausea/vomiting, dysuria, vaginal discharge, vaginal odor. Endorses vaginal pain. Presents NAD.  Prior to Admission medications   Medication Sig Start Date End Date Taking? Authorizing Provider  amLODipine  (NORVASC ) 10 MG tablet Take 1 tablet (10 mg total) by mouth at bedtime. 03/18/23   Delores Suzann HERO, MD  carvedilol  (COREG ) 25 MG tablet Take 1 tablet (25 mg total) by mouth 2 (two) times daily with a meal. 01/13/23   Delores Suzann HERO, MD  escitalopram  (LEXAPRO ) 20 MG tablet Take 1 tablet (20 mg total) by mouth daily. 11/02/23   McDiarmid, Krystal BIRCH, MD  esomeprazole  (NEXIUM ) 10 MG packet Take 10 mg by mouth daily before breakfast.    [provider]  gabapentin  (NEURONTIN ) 300 MG capsule Take 2 capsules (600 mg total) by mouth at bedtime. 01/09/24   Delores Suzann HERO, MD  hydrochlorothiazide  (HYDRODIURIL ) 12.5 MG tablet Take 1 tablet (12.5 mg total) by mouth daily. 01/24/24   Delores Suzann HERO, MD  Insulin Pen Needle (BD PEN NEEDLE MICRO U/F) 32G X 6 MM MISC Use to inject ozempic  09/14/21   Delores Suzann HERO, MD  ipratropium (ATROVENT ) 0.06 % nasal spray Place 2 sprays into both nostrils 3 (three) times  daily. As needed for nasal congestion, runny nose Patient not taking: Reported on 12/20/2023 11/16/23   Joesph Shaver Scales, PA-C  Ipratropium-Albuterol  (COMBIVENT  RESPIMAT) 20-100 MCG/ACT AERS respimat Inhale 1 puff into the lungs every 6 (six) hours. Patient not taking: Reported on 12/20/2023 11/16/23 02/14/24  Joesph Shaver Scales, PA-C  ipratropium-albuterol  (DUONEB) 0.5-2.5 (3) MG/3ML SOLN Take 3 mLs by nebulization every 6 (six) hours as needed. Patient not taking: Reported on 12/20/2023 11/16/23 02/14/24  Joesph Shaver Scales, PA-C  ketoconazole  (NIZORAL ) 2 % cream Apply to affected areas and the 1 inch area surrounding the affected areas once daily for at least 14 days.  Once rash is completely resolved, continue applying to previously affected area for another 7 days to ensure complete resolution. 01/21/24   Joesph Shaver Scales, PA-C  losartan  (COZAAR ) 100 MG tablet Take 1 tablet (100 mg total) by mouth at bedtime. 04/11/23   Delores Suzann HERO, MD  metFORMIN  (GLUCOPHAGE -XR) 500 MG 24 hr tablet TAKE 2 TABLETS BY MOUTH TWICE DAILY 11/28/23   Delores Suzann HERO, MD  pravastatin  (PRAVACHOL ) 20 MG tablet Take 1 tablet (20 mg total) by mouth at bedtime. 12/06/23   Delores Suzann HERO, MD  Semaglutide , 2 MG/DOSE, (OZEMPIC , 2 MG/DOSE,) 8 MG/3ML SOPN Inject 2 mg into the skin once a week. Patient taking differently: Inject 2 mg into the skin once a week. 12/05/23   Delores Suzann HERO, MD  Spacer/Aero-Holding Raguel FRENCH Use with  you inhaler Patient not taking: Reported on 12/20/2023 11/24/23   Delaynee Alred P, PA-C  umeclidinium-vilanterol (ANORO ELLIPTA ) 62.5-25 MCG/ACT AEPB Inhale 1 puff into the lungs daily. 12/05/23   Delores Suzann HERO, MD     Allergies  Allergen Reactions   Ace Inhibitors Cough    Other reaction(s): Cough (ALLERGY/intolerance)   Metformin  And Related Diarrhea    Able to tolerate XR with only mild symptoms     History:   Past Medical History:  Diagnosis Date   Adhesive capsulitis of  shoulder    Allergy    Borderline personality disorder (HCC)    Carpal tunnel syndrome    COPD (chronic obstructive pulmonary disease) (HCC)    Depression    GERD (gastroesophageal reflux disease)    Hypertension    Nephrolithiasis    Type 2 diabetes mellitus (HCC)      Past Surgical History:  Procedure Laterality Date   CHOLECYSTECTOMY     TONSILLECTOMY      Family History  Problem Relation Age of Onset   Diabetes Mother    Hypertension Mother    Alzheimer's disease Father    Dementia Father    Ovarian cancer Other        aunt uncertain age    Social History   Tobacco Use   Smoking status: Former    Current packs/day: 0.00    Average packs/day: 0.3 packs/day for 30.0 years (7.5 ttl pk-yrs)    Types: Cigarettes    Start date: 08/12/1977    Quit date: 08/13/2007    Years since quitting: 16.5   Smokeless tobacco: Never   Tobacco comments:    boyfriend smokes  Vaping Use   Vaping status: Never Used  Substance Use Topics   Alcohol use: No    Comment: rarely   Drug use: No    Review of Systems  Constitutional:  Negative for fever.  Gastrointestinal:  Negative for abdominal pain, nausea and vomiting.  Genitourinary:  Positive for vaginal pain. Negative for dysuria, genital sores and vaginal discharge.     Objective:   Vitals: BP (!) 139/92 (BP Location: Right Arm)   Pulse 78   Temp 98.3 F (36.8 C)   Resp 18   LMP 07/15/2015 (Exact Date)   SpO2 96%   Physical Exam Exam conducted with a chaperone present Helayne Senters, RT).  Constitutional:      General: She is not in acute distress.    Appearance: Normal appearance. She is well-developed. She is morbidly obese. She is not ill-appearing or toxic-appearing.  HENT:     Head: Normocephalic and atraumatic.  Cardiovascular:     Rate and Rhythm: Normal rate and regular rhythm.     Heart sounds: Normal heart sounds.  Pulmonary:     Effort: Pulmonary effort is normal.     Breath sounds: Normal breath sounds.      Comments: Clear to auscultation bilaterally  Abdominal:     General: Bowel sounds are normal.     Palpations: Abdomen is soft.     Tenderness: There is no abdominal tenderness.  Genitourinary:    Comments: Discharge noted on both labia external as well as around the clitoris. Appearance consistent with yeast. Irritation noted around the opening of the vaginal. No lesions noted at the site of pain.  Skin:    General: Skin is warm and dry.  Neurological:     General: No focal deficit present.     Mental Status: She is alert.  Psychiatric:  Mood and Affect: Mood and affect normal.     Results:  Labs: No results found for this or any previous visit (from the past 24 hours).  Radiology: No results found.   UC Course/Treatments:  Procedures: Procedures   Medications Ordered in UC: Medications - No data to display   Assessment and Plan :     ICD-10-CM   1. Vaginal pain  R10.2 Cervicovaginal ancillary only    Cervicovaginal ancillary only    2. Vaginal discharge  N89.8      Vaginal pain Vaginal Discharge  Afebrile, nontoxic-appearing, NAD. VSS. DDX includes but not limited to: BV, yeast, STD, vaginal atrophy Discharge noted on exam consistent with yeast as well as general vaginal irritation. Suspect yeast infection. Diflucan  150mg  every 72 hours was prescribed. Recommend follow up with GYN if symptoms persist for possible vaginal atrophy/dryness. Strict ED precautions were given and patient verbalized understanding.   ED Discharge Orders          Ordered    fluconazole  (DIFLUCAN ) 150 MG tablet  every 72 hours        02/21/24 1008             PDMP not reviewed this encounter.     Zakariye Nee P, PA-C 02/21/24 1012

## 2024-02-22 LAB — CERVICOVAGINAL ANCILLARY ONLY
Bacterial Vaginitis (gardnerella): NEGATIVE
Candida Glabrata: NEGATIVE
Candida Vaginitis: POSITIVE — AB
Comment: NEGATIVE
Comment: NEGATIVE
Comment: NEGATIVE

## 2024-02-23 ENCOUNTER — Ambulatory Visit (HOSPITAL_COMMUNITY): Payer: Self-pay

## 2024-03-22 ENCOUNTER — Other Ambulatory Visit: Payer: Self-pay

## 2024-03-22 MED ORDER — CARVEDILOL 25 MG PO TABS: 25.0000 mg | ORAL_TABLET | Freq: Two times a day (BID) | ORAL | 3 refills | Status: AC

## 2024-04-09 ENCOUNTER — Encounter: Payer: Self-pay | Admitting: Radiology

## 2024-05-10 ENCOUNTER — Other Ambulatory Visit: Payer: Self-pay

## 2024-05-10 DIAGNOSIS — E1149 Type 2 diabetes mellitus with other diabetic neurological complication: Secondary | ICD-10-CM

## 2024-05-10 MED ORDER — OZEMPIC (2 MG/DOSE) 8 MG/3ML ~~LOC~~ SOPN: 2.0000 mg | PEN_INJECTOR | SUBCUTANEOUS | 3 refills | Status: DC

## 2024-06-18 ENCOUNTER — Telehealth: Payer: Self-pay

## 2024-06-18 NOTE — Telephone Encounter (Signed)
 Patient calls nurse line in regards to Ozempic .   She reports the medication is needing a prior authorization.   Advised we have not seen her for an A1c since June 2025. Advised this may effect her approval.   Patient reports she can not come in due to transportation at he moment.   Advised will forward to pharmacy team.

## 2024-06-19 ENCOUNTER — Other Ambulatory Visit (HOSPITAL_COMMUNITY): Payer: Self-pay

## 2024-06-20 NOTE — Telephone Encounter (Signed)
 Pharmacy Patient Advocate Encounter   Received notification from [employer] KEY that prior authorization for OZEMPIC  (2MG  DOSE PEN) is required/requested.   Insurance verification completed.   The patient is insured through [facility] MEDICAID.   Per test claim: PA required; PA started via CoverMyMeds. KEY [id number] . Waiting for clinical questions to populate.

## 2024-06-21 NOTE — Telephone Encounter (Signed)
 Pharmacy Patient Advocate Encounter   PA required; PA submitted to above mentioned insurance via Latent Key/confirmation #/EOC [id number]. Status is pending

## 2024-06-22 NOTE — Telephone Encounter (Signed)
 Pharmacy Patient Advocate Encounter  Received notification from [facility] MEDICAID that Prior Authorization for OZEMPIC  has been DENIED.  Full denial letter will be uploaded to the media tab. See denial reason below.  Here are the policy requirements your request did not meet:   Your doctor must give us  information that shows (such as chart notes and labs) your Type 2 Diabetes has improved while on the drug.   Your doctor must give us  information that your treatment goals are being met or you are making good progress towards your treatment goals.   Patient will need OV  PA #/Case ID/Reference #: [id number]

## 2024-06-25 NOTE — Telephone Encounter (Signed)
 Attempted to call patient to discuss, however  no answer.   Patient needs an OV for insurance approval.   Patient has an apt with [facility] tomorrow.

## 2024-06-25 NOTE — Telephone Encounter (Signed)
 Patient returns call to nurse line.   Discussed denial and the need for an office visit.   She confirms her apt with [facility] for tomorrow.

## 2024-06-26 ENCOUNTER — Telehealth: Payer: Self-pay

## 2024-06-26 ENCOUNTER — Ambulatory Visit: Admitting: Pharmacist

## 2024-06-26 ENCOUNTER — Encounter: Payer: Self-pay | Admitting: Pharmacist

## 2024-06-26 VITALS — BP 109/68 | HR 73 | Wt 197.8 lb

## 2024-06-26 DIAGNOSIS — E1149 Type 2 diabetes mellitus with other diabetic neurological complication: Secondary | ICD-10-CM | POA: Diagnosis present

## 2024-06-26 LAB — POCT GLYCOSYLATED HEMOGLOBIN (HGB A1C): HbA1c, POC (controlled diabetic range): 6.4 % (ref 0.0–7.0)

## 2024-06-26 MED ORDER — OZEMPIC (2 MG/DOSE) 8 MG/3ML ~~LOC~~ SOPN: 2.0000 mg | PEN_INJECTOR | SUBCUTANEOUS | 3 refills | Status: AC

## 2024-06-26 NOTE — Telephone Encounter (Signed)
 Pharmacy Patient Advocate Encounter   Received notification from CoverMyMeds that prior authorization for OZEMPIC  2MG  DOSE PENS is required/requested.   Insurance verification completed.   The patient is insured through [facility] MEDICAID.   Per test claim: PA required; PA submitted to above mentioned insurance via Latent Key/confirmation #/EOC [id number]. Status is pending

## 2024-06-26 NOTE — Progress Notes (Signed)
 "   S:     Chief Complaint  Patient presents with   Medication Management    DM-Follow up   64 y.o. female who presents for diabetes evaluation, education, and management. Patient arrives in good spirits and presents without any assistance.  Patient was referred and last seen by Primary Care Provider, Dr. Delores, on 12/05/2023.    PMH is significant for T2DM, Hypertension,Osteoarthritis, SOB.  Patient reports Diabetes was diagnosed in 11/2012.   Sick feeling - stomach has bowel movement  Family/Social History: Broke up with boyfriend few weeks ago  Current diabetes medications include: Ozempic  (semaglutide ) 2 mg, Metformin  XR 1000mg  BID. Current hypertension medications include: amlodipine  10 mg daily, carvedilol  25 mg daily, hydrochlorothiazide  12.5 mg daily, losartan  100 mg daily Current hyperlipidemia medications include: pravastatin  20 mg daily  Patient reports adherence to taking all medications as prescribed.  Patient denies adherence with medications. Do you feel that your medications are working for you? yes Have you been experiencing any side effects to the medications prescribed? no  Patient denies hypoglycemic events.  Patient reported dietary habits: Eats 2 meals/day Breakfast: Sweetened tea or iced coffee (caramel or vanilla) Lunch: Soup (Chicken noodle soup), Taco Dinner: Fish, slaw, onion rings (air fryers) Snacks: cereal, cashews, celery, bagels Drinks: Lipton tea  Patient-reported exercise habits: Planned to walk 2-3 times a week  O:  Review of Systems  Neurological:  Negative for dizziness.  All other systems reviewed and are negative.   Physical Exam Constitutional:      Appearance: Normal appearance.  Neurological:     Mental Status: She is alert.  Psychiatric:        Mood and Affect: Mood normal.        Behavior: Behavior normal.        Thought Content: Thought content normal.        Judgment: Judgment normal.     Lab Results  Component  Value Date   HGBA1C 6.4 06/26/2024   Vitals:   06/26/24 1030  BP: 109/68  Pulse: 73  SpO2: 97%    Lipid Panel     Component Value Date/Time   CHOL 236 (H) 12/05/2023 0959   TRIG 254 (H) 12/05/2023 0959   HDL 56 12/05/2023 0959   CHOLHDL 4.2 12/05/2023 0959   CHOLHDL 3.7 11/12/2015 1554   VLDL 71 (H) 11/12/2015 1554   LDLCALC 135 (H) 12/05/2023 0959   LDLDIRECT 141 (H) 05/23/2023 1047    Clinical Atherosclerotic Cardiovascular Disease (ASCVD): No  The 10-year ASCVD risk score (Arnett DK, et al., 2019) is: 8.3%   Values used to calculate the score:     Age: 20 years     Clinically relevant sex: Female     Is Non-Hispanic African American: No     Diabetic: Yes     Tobacco smoker: No     Systolic Blood Pressure: 109 mmHg     Is BP treated: Yes     HDL Cholesterol: 56 mg/dL     Total Cholesterol: 236 mg/dL  Lab Results  Component Value Date   CHOL 236 (H) 12/05/2023   HDL 56 12/05/2023   LDLCALC 135 (H) 12/05/2023   LDLDIRECT 141 (H) 05/23/2023   TRIG 254 (H) 12/05/2023   CHOLHDL 4.2 12/05/2023    Lab Results  Component Value Date   CREATININE 1.15 (H) 12/05/2023   BUN 22 12/05/2023   NA 135 12/05/2023   K 4.1 12/05/2023   CL 95 (L) 12/05/2023   CO2 20  12/05/2023    Medications Reviewed Today     Reviewed by Kenyan Karnes G, RPH-CPP (Pharmacist) on 06/26/24 at 1030  Med List Status: <None>   Medication Order Taking? Sig Documenting Provider Last Dose Status Informant  amLODipine  (NORVASC ) 10 MG tablet [id number] Yes Take 1 tablet (10 mg total) by mouth at bedtime. Delores Suzann HERO, MD  Active   carvedilol  (COREG ) 25 MG tablet [id number] Yes Take 1 tablet (25 mg total) by mouth 2 (two) times daily with a meal. Delores Suzann HERO, MD  Active   escitalopram  (LEXAPRO ) 20 MG tablet [id number] Yes Take 1 tablet (20 mg total) by mouth daily. McDiarmid, Krystal BIRCH, MD  Active   esomeprazole  (NEXIUM ) 10 MG packet [id number]  Take 10 mg by mouth daily before breakfast. [provider]  Active            Med Note TRACI, [place]  G   Wed Nov 16, 2023 10:22 AM) PRN  gabapentin  (NEURONTIN ) 300 MG capsule [id number] Yes Take 2 capsules (600 mg total) by mouth at bedtime. Delores Suzann HERO, MD  Active   hydrochlorothiazide  (HYDRODIURIL ) 12.5 MG tablet [id number] Yes Take 1 tablet (12.5 mg total) by mouth daily. Delores Suzann HERO, MD  Active   Insulin Pen Needle (BD PEN NEEDLE MICRO U/F) 32G X 6 MM MISC [id number]  Use to inject ozempic  Delores Suzann HERO, MD  Active   ipratropium (ATROVENT ) 0.06 % nasal spray [id number]  Place 2 sprays into both nostrils 3 (three) times daily. As needed for nasal congestion, runny nose  Patient not taking: Reported on 12/20/2023   Joesph Shaver Scales, PA-C  Active   Ipratropium-Albuterol  (COMBIVENT  RESPIMAT) 20-100 MCG/ACT AERS respimat [id number]  Inhale 1 puff into the lungs every 6 (six) hours.  Patient not taking: Reported on 12/20/2023   Joesph Shaver Scales, PA-C  Expired 02/14/24 2359   ipratropium-albuterol  (DUONEB) 0.5-2.5 (3) MG/3ML SOLN [id number]  Take 3 mLs by nebulization every 6 (six) hours as needed.  Patient not taking: Reported on 12/20/2023   Joesph Shaver Scales, PA-C  Expired 02/14/24 2359   ketoconazole  (NIZORAL ) 2 % cream [id number] Yes Apply to affected areas and the 1 inch area surrounding the affected areas once daily for at least 14 days.  Once rash is completely resolved, continue applying to previously affected area for another 7 days to ensure complete resolution.  Patient taking differently: daily as needed. Apply to affected areas and the 1 inch area surrounding the affected areas once daily for at least 14 days.  Once rash is completely resolved, continue applying to previously affected area for another 7 days to ensure complete resolution.   Joesph Shaver Scales, PA-C  Active   losartan  (COZAAR ) 100 MG tablet [id number] Yes Take 1 tablet (100 mg total) by mouth at bedtime. Delores Suzann HERO, MD  Active    metFORMIN  (GLUCOPHAGE -XR) 500 MG 24 hr tablet [id number] Yes TAKE 2 TABLETS BY MOUTH TWICE DAILY  Patient taking differently: Take by mouth 2 (two) times daily with a meal. TAKE 2 TABLETS BY MOUTH TWICE DAILY   Delores Suzann HERO, MD  Active   pravastatin  (PRAVACHOL ) 20 MG tablet [id number] Yes Take 1 tablet (20 mg total) by mouth at bedtime. Delores Suzann HERO, MD  Active   Semaglutide , 2 MG/DOSE, (OZEMPIC , 2 MG/DOSE,) 8 MG/3ML SOPN [id number] Yes Inject 2 mg into the skin once a week. Delores Suzann HERO, MD  Active   Spacer/Aero-Holding [place] DEVI [id number]  Use with you inhaler  Patient not taking: Reported on 12/20/2023   Hermanns, Ashlee P, PA-C  Active   umeclidinium-vilanterol (ANORO ELLIPTA ) 62.5-25 MCG/ACT AEPB [id number]  Inhale 1 puff into the lungs daily.  Patient not taking: Reported on 06/26/2024   Delores Suzann HERO, MD  Active   Med List Note Rena, Camellia MATSU, MD 11/20/14 1117):                  Patient is participating in a Managed Medicaid Plan:  Yes   A/P: Diabetes longstanding since 2014 currently much improved current A1c 6.4. Medication adherence appears good. Patient reports progress of diet, portion control and excersice - Continued GLP-1 Ozempic  (semaglutide ) 2 mg .  - Continued metformin  XR 1000 mg BID.  - Patient educated on purpose, proper use, and potential adverse effects.  - Extensively discussed pathophysiology of diabetes, recommended lifestyle interventions, dietary effects on glucose control.  - Counseled on s/sx of and management of hypoglycemia.  - Encourage continue progress with diet plan and exercise  ASCVD risk - Primary prevention in patient with diabetes. Last LDL 135 mg/dl is not at goal of <29 mg/dL. Moderate intensity statin indicated. Consider dose adjustment (80 mg pravastatin ) or change medication (High intensity statin) - Continued pravastatin  20 mg daily.   Hypertension well controlled. Blood pressure goal of <130/80  mmHg. Medication  adherence - Continued amlodipine  10 mg daily, carvedilol  25 mg daily, losartan  100 mg daily.  Written patient instructions provided. Patient verbalized understanding of treatment plan.  Total time in face to face counseling 27 minutes.    Follow-up:  Pharmacist visit TBD PCP clinic visit 08/28/24 Patient seen with Sabra Schuller, PharmD Candidate - PY2 student and Megan McGill, PharmD Candidate - PY4 student.    "

## 2024-06-26 NOTE — Assessment & Plan Note (Signed)
 Diabetes longstanding since 2014 currently much improved current A1c 6.4. Medication adherence appears good. Patient reports progress of diet, portion control and excersice - Continued GLP-1 Ozempic  (semaglutide ) 2 mg .  - Continued metformin  XR 1000 mg BID.  - Patient educated on purpose, proper use, and potential adverse effects.  - Extensively discussed pathophysiology of diabetes, recommended lifestyle interventions, dietary effects on glucose control.  - Counseled on s/sx of and management of hypoglycemia.  - Encourage continue progress with diet plan and exercise  ASCVD risk - Primary prevention in patient with diabetes. Last LDL 135 mg/dl is not at goal of <29 mg/dL. Moderate intensity statin indicated. Consider dose adjustment (80 mg pravastatin ) or change medication (High intensity statin) - Continued pravastatin  20 mg daily.

## 2024-06-26 NOTE — Patient Instructions (Signed)
 It was nice to see you today!  Your goal glucose value is 80-130 before eating and less than 180 after eating.  Medication Changes: Continue all other medication the same.   Keep up the good work with diet and exercise. Aim for a diet full of vegetables, fruit and lean meats (chicken, turkey, fish). Try to limit salt intake by eating fresh or frozen vegetables (instead of canned), rinse canned vegetables prior to cooking and do not add any additional salt to meals.   Monitor your glucose at home and keep a log (glucometer or piece of paper) to bring with you to your next visit.  Please bring all medications to your clinic visits.  Please arrive 10-15 minutes prior to your scheduled visit time.

## 2024-06-27 NOTE — Progress Notes (Signed)
 Reviewed and agree with Dr Rennis plan.

## 2024-06-28 NOTE — Telephone Encounter (Signed)
 Pharmacy Patient Advocate Encounter  Received notification from [facility] MEDICAID that Prior Authorization for OZEMPIC  2MG  DOSE PENS has been APPROVED from 06/27/24 to 06/27/25   PA #/Case ID/Reference #: [id number]

## 2024-07-03 NOTE — Telephone Encounter (Signed)
 Attempted to contact patient to share PA approval notification for Ozempic  (semaglutide ) 2mg g.  Voice Mail full - unable to leave message.   No recent my chart access.   Total time with patient call and documentation of interaction: 4 minutes.  Follow-up phone call planned: none planned.

## 2024-07-05 ENCOUNTER — Telehealth: Payer: Self-pay | Admitting: Pharmacist

## 2024-07-05 MED ORDER — FLUCONAZOLE 150 MG PO TABS: 150.0000 mg | ORAL_TABLET | ORAL | 0 refills | Status: AC

## 2024-07-05 NOTE — Telephone Encounter (Signed)
 Patient contacts office, stating that her Ozempic  (semaglutide ) prescription has been approved and she is awaiting delivery of the prescription from her pharmacy.    Additionally, patient shared complaint of most likely a yeast infection and reports inability to travel to the office for evaluation due to lack of transportation options currently.   She asked if filling a prescription for diflucan  would be possible.   I agreed to send prescription for diflucan  (fluconazole )  150mg  X1 then repeat on day 3    Total time with patient call and documentation of interaction: 12 minutes.

## 2024-08-28 ENCOUNTER — Ambulatory Visit: Admitting: Family Medicine
